# Patient Record
Sex: Female | Born: 1975 | Race: White | Hispanic: No | Marital: Married | Smoking: Never smoker
Health system: Southern US, Community
[De-identification: ages and names within clinical notes are randomized; demographics above are authoritative.]

## PROBLEM LIST (undated history)

## (undated) ENCOUNTER — Emergency Department (HOSPITAL_COMMUNITY): Disposition: A | Discharge status: Home / Self Care | Payer: BC Managed Care – PPO

## (undated) DIAGNOSIS — T4145XA Adverse effect of unspecified anesthetic, initial encounter: Secondary | ICD-10-CM

## (undated) DIAGNOSIS — F988 Other specified behavioral and emotional disorders with onset usually occurring in childhood and adolescence: Secondary | ICD-10-CM

## (undated) DIAGNOSIS — F32A Depression, unspecified: Secondary | ICD-10-CM

## (undated) DIAGNOSIS — T8859XA Other complications of anesthesia, initial encounter: Secondary | ICD-10-CM

## (undated) DIAGNOSIS — IMO0002 Reserved for concepts with insufficient information to code with codable children: Secondary | ICD-10-CM

## (undated) DIAGNOSIS — M519 Unspecified thoracic, thoracolumbar and lumbosacral intervertebral disc disorder: Secondary | ICD-10-CM

## (undated) DIAGNOSIS — T7840XA Allergy, unspecified, initial encounter: Secondary | ICD-10-CM

## (undated) DIAGNOSIS — M4310 Spondylolisthesis, site unspecified: Secondary | ICD-10-CM

## (undated) DIAGNOSIS — F419 Anxiety disorder, unspecified: Secondary | ICD-10-CM

## (undated) DIAGNOSIS — F329 Major depressive disorder, single episode, unspecified: Secondary | ICD-10-CM

## (undated) HISTORY — PX: TONSILLECTOMY: SUR1361

## (undated) HISTORY — DX: Spondylolisthesis, site unspecified: M43.10

## (undated) HISTORY — DX: Allergy, unspecified, initial encounter: T78.40XA

## (undated) HISTORY — PX: HYSTERECTOMY ABDOMINAL WITH SALPINGO-OOPHORECTOMY: SHX6792

## (undated) HISTORY — DX: Unspecified thoracic, thoracolumbar and lumbosacral intervertebral disc disorder: M51.9

## (undated) HISTORY — PX: CHOLECYSTECTOMY: SHX55

## (undated) HISTORY — DX: Reserved for concepts with insufficient information to code with codable children: IMO0002

## (undated) HISTORY — PX: ABDOMINAL HYSTERECTOMY: SHX81

---

## 2005-03-23 HISTORY — PX: OTHER SURGICAL HISTORY: SHX169

## 2009-10-24 ENCOUNTER — Emergency Department (HOSPITAL_COMMUNITY): Admission: EM | Admit: 2009-10-24 | Discharge: 2009-10-24 | Payer: Self-pay | Admitting: Emergency Medicine

## 2009-11-06 ENCOUNTER — Emergency Department (HOSPITAL_COMMUNITY): Admission: EM | Admit: 2009-11-06 | Discharge: 2009-11-06 | Payer: Self-pay | Admitting: Emergency Medicine

## 2009-11-28 ENCOUNTER — Emergency Department (HOSPITAL_COMMUNITY): Admission: EM | Admit: 2009-11-28 | Discharge: 2009-11-28 | Payer: Self-pay | Admitting: Emergency Medicine

## 2009-12-14 ENCOUNTER — Emergency Department (HOSPITAL_COMMUNITY)
Admission: EM | Admit: 2009-12-14 | Discharge: 2009-12-14 | Payer: Self-pay | Source: Home / Self Care | Admitting: Emergency Medicine

## 2009-12-15 ENCOUNTER — Emergency Department (HOSPITAL_COMMUNITY)
Admission: EM | Admit: 2009-12-15 | Discharge: 2009-12-15 | Payer: Self-pay | Source: Home / Self Care | Admitting: Emergency Medicine

## 2010-01-21 ENCOUNTER — Emergency Department (HOSPITAL_COMMUNITY): Admission: EM | Admit: 2010-01-21 | Discharge: 2010-01-21 | Payer: Self-pay | Admitting: Emergency Medicine

## 2010-02-06 ENCOUNTER — Observation Stay (HOSPITAL_COMMUNITY)
Admission: EM | Admit: 2010-02-06 | Discharge: 2010-02-07 | Payer: Self-pay | Source: Home / Self Care | Admitting: Emergency Medicine

## 2010-02-26 ENCOUNTER — Emergency Department (HOSPITAL_COMMUNITY)
Admission: EM | Admit: 2010-02-26 | Discharge: 2010-02-26 | Payer: Self-pay | Source: Home / Self Care | Admitting: Emergency Medicine

## 2010-03-12 ENCOUNTER — Emergency Department (HOSPITAL_COMMUNITY)
Admission: EM | Admit: 2010-03-12 | Discharge: 2010-03-12 | Payer: Self-pay | Source: Home / Self Care | Admitting: Emergency Medicine

## 2010-03-13 ENCOUNTER — Observation Stay (HOSPITAL_COMMUNITY)
Admission: EM | Admit: 2010-03-13 | Discharge: 2010-03-16 | Payer: Self-pay | Source: Home / Self Care | Attending: Internal Medicine | Admitting: Internal Medicine

## 2010-04-12 ENCOUNTER — Emergency Department (HOSPITAL_COMMUNITY)
Admission: EM | Admit: 2010-04-12 | Discharge: 2010-04-12 | Payer: Self-pay | Source: Home / Self Care | Admitting: Emergency Medicine
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
## 2010-04-23 ENCOUNTER — Emergency Department (HOSPITAL_COMMUNITY)
Admission: EM | Admit: 2010-04-23 | Discharge: 2010-04-23 | Disposition: A | Discharge status: Home / Self Care | Payer: BC Managed Care – PPO | Attending: Emergency Medicine | Admitting: Emergency Medicine

## 2010-04-23 DIAGNOSIS — G43909 Migraine, unspecified, not intractable, without status migrainosus: Secondary | ICD-10-CM | POA: Insufficient documentation

## 2010-04-24 ENCOUNTER — Emergency Department (HOSPITAL_COMMUNITY)
Admission: EM | Admit: 2010-04-24 | Discharge: 2010-04-24 | Disposition: A | Discharge status: Home / Self Care | Payer: BC Managed Care – PPO | Attending: Emergency Medicine | Admitting: Emergency Medicine

## 2010-04-24 DIAGNOSIS — R229 Localized swelling, mass and lump, unspecified: Secondary | ICD-10-CM | POA: Insufficient documentation

## 2010-04-24 DIAGNOSIS — E119 Type 2 diabetes mellitus without complications: Secondary | ICD-10-CM | POA: Insufficient documentation

## 2010-04-24 DIAGNOSIS — M79609 Pain in unspecified limb: Secondary | ICD-10-CM | POA: Insufficient documentation

## 2010-04-29 ENCOUNTER — Encounter (HOSPITAL_COMMUNITY): Payer: Self-pay | Admitting: Radiology

## 2010-04-29 ENCOUNTER — Emergency Department (HOSPITAL_COMMUNITY): Payer: BC Managed Care – PPO

## 2010-04-29 ENCOUNTER — Inpatient Hospital Stay (HOSPITAL_COMMUNITY)
Admission: EM | Admit: 2010-04-29 | Discharge: 2010-05-03 | DRG: 025 | Disposition: A | Discharge status: Home / Self Care | Payer: BC Managed Care – PPO | Attending: Family Medicine | Admitting: Family Medicine

## 2010-04-29 DIAGNOSIS — E119 Type 2 diabetes mellitus without complications: Secondary | ICD-10-CM | POA: Diagnosis present

## 2010-04-29 DIAGNOSIS — F449 Dissociative and conversion disorder, unspecified: Secondary | ICD-10-CM | POA: Diagnosis present

## 2010-04-29 DIAGNOSIS — K219 Gastro-esophageal reflux disease without esophagitis: Secondary | ICD-10-CM | POA: Diagnosis present

## 2010-04-29 DIAGNOSIS — G43409 Hemiplegic migraine, not intractable, without status migrainosus: Principal | ICD-10-CM | POA: Diagnosis present

## 2010-04-29 DIAGNOSIS — F319 Bipolar disorder, unspecified: Secondary | ICD-10-CM | POA: Diagnosis present

## 2010-04-29 DIAGNOSIS — R471 Dysarthria and anarthria: Secondary | ICD-10-CM | POA: Diagnosis present

## 2010-04-29 DIAGNOSIS — F339 Major depressive disorder, recurrent, unspecified: Secondary | ICD-10-CM | POA: Diagnosis present

## 2010-04-29 DIAGNOSIS — G4733 Obstructive sleep apnea (adult) (pediatric): Secondary | ICD-10-CM | POA: Diagnosis present

## 2010-04-29 DIAGNOSIS — F988 Other specified behavioral and emotional disorders with onset usually occurring in childhood and adolescence: Secondary | ICD-10-CM | POA: Diagnosis present

## 2010-04-29 DIAGNOSIS — Z79899 Other long term (current) drug therapy: Secondary | ICD-10-CM

## 2010-04-29 DIAGNOSIS — R197 Diarrhea, unspecified: Secondary | ICD-10-CM | POA: Diagnosis present

## 2010-04-29 LAB — COMPREHENSIVE METABOLIC PANEL
AST: 24 U/L (ref 0–37)
Albumin: 3.4 g/dL — ABNORMAL LOW (ref 3.5–5.2)
Alkaline Phosphatase: 51 U/L (ref 39–117)
BUN: 8 mg/dL (ref 6–23)
Chloride: 108 mEq/L (ref 96–112)
GFR calc Af Amer: >60 mL/min (ref >60)
Potassium: 3.8 mEq/L (ref 3.5–5.1)
Total Bilirubin: 0.6 mg/dL (ref 0.3–1.2)
Total Protein: 6.1 g/dL (ref 6.0–8.3)

## 2010-04-29 LAB — CBC
MCV: 79.8 fL (ref 78.0–100.0)
Platelets: 177 10*3/uL (ref 150–400)
RBC: 4.4 MIL/uL (ref 3.87–5.11)
RDW: 13.7 % (ref 11.5–15.5)
WBC: 7.5 10*3/uL (ref 4.0–10.5)

## 2010-04-29 LAB — DIFFERENTIAL
Basophils Absolute: 0 10*3/uL (ref 0.0–0.1)
Basophils Relative: 0 % (ref 0–1)
Eosinophils Absolute: 0.2 10*3/uL (ref 0.0–0.7)
Eosinophils Relative: 3 % (ref 0–5)
Neutrophils Relative %: 47 % (ref 43–77)

## 2010-04-30 LAB — GLUCOSE, CAPILLARY
Glucose-Capillary: 115 mg/dL — ABNORMAL HIGH (ref 70–99)
Glucose-Capillary: 102 mg/dL — ABNORMAL HIGH (ref 70–99)
Glucose-Capillary: 125 mg/dL — ABNORMAL HIGH (ref 70–99)

## 2010-04-30 LAB — MAGNESIUM: Magnesium: 1.9 mg/dL (ref 1.5–2.5)

## 2010-05-01 LAB — GLUCOSE, CAPILLARY
Glucose-Capillary: 148 mg/dL — ABNORMAL HIGH (ref 70–99)
Glucose-Capillary: 122 mg/dL — ABNORMAL HIGH (ref 70–99)
Glucose-Capillary: 136 mg/dL — ABNORMAL HIGH (ref 70–99)

## 2010-05-02 ENCOUNTER — Inpatient Hospital Stay (HOSPITAL_COMMUNITY): Payer: BC Managed Care – PPO

## 2010-05-02 LAB — BASIC METABOLIC PANEL
BUN: 11 mg/dL (ref 6–23)
Calcium: 8.6 mg/dL (ref 8.4–10.5)
Creatinine, Ser: 0.78 mg/dL (ref 0.4–1.2)
GFR calc Af Amer: >60 mL/min (ref >60)
GFR calc non Af Amer: >60 mL/min (ref >60)

## 2010-05-02 LAB — GLUCOSE, CAPILLARY
Glucose-Capillary: 144 mg/dL — ABNORMAL HIGH (ref 70–99)
Glucose-Capillary: 161 mg/dL — ABNORMAL HIGH (ref 70–99)
Glucose-Capillary: 193 mg/dL — ABNORMAL HIGH (ref 70–99)

## 2010-05-02 LAB — CBC
Platelets: 185 10*3/uL (ref 150–400)
RBC: 4.61 MIL/uL (ref 3.87–5.11)
RDW: 13.7 % (ref 11.5–15.5)
WBC: 9.4 10*3/uL (ref 4.0–10.5)

## 2010-05-03 DIAGNOSIS — F319 Bipolar disorder, unspecified: Secondary | ICD-10-CM

## 2010-05-03 LAB — GLUCOSE, CAPILLARY
Glucose-Capillary: 167 mg/dL — ABNORMAL HIGH (ref 70–99)
Glucose-Capillary: 164 mg/dL — ABNORMAL HIGH (ref 70–99)

## 2010-05-03 LAB — CBC
HCT: 38.9 % (ref 36.0–46.0)
Hemoglobin: 12.4 g/dL (ref 12.0–15.0)
MCH: 25.9 pg — ABNORMAL LOW (ref 26.0–34.0)
MCHC: 31.9 g/dL (ref 30.0–36.0)
MCV: 81.4 fL (ref 78.0–100.0)
Platelets: 193 10*3/uL (ref 150–400)
RBC: 4.78 MIL/uL (ref 3.87–5.11)
RDW: 13.5 % (ref 11.5–15.5)
WBC: 8.3 10*3/uL (ref 4.0–10.5)

## 2010-05-03 LAB — DIFFERENTIAL
Basophils Absolute: 0.1 10*3/uL (ref 0.0–0.1)
Basophils Relative: 1 % (ref 0–1)
Eosinophils Relative: 4 % (ref 0–5)
Monocytes Absolute: 0.4 10*3/uL (ref 0.1–1.0)

## 2010-05-04 NOTE — Consult Note (Signed)
[name], [name]               ACCOUNT NO.:  [id number]  MEDICAL RECORD NO.:  [id number]           PATIENT TYPE:  I  LOCATION:  1521                         FACILITY:  [facility]  PHYSICIAN:  [name], MD DATE OF BIRTH:  25-Nov-1975  DATE OF CONSULTATION:  05/01/2010 DATE OF DISCHARGE:                                CONSULTATION   REASON FOR CONSULTATION:  Rule out conversion disorder, psychogenic weakness.  HISTORY OF PRESENT ILLNESS:  A 35 year old Caucasian female who was admitted because of sudden onset of left-sided weakness and worsening headache.  The patient has a history of hemiplegic migraine.  The patient is followed by Dr. Lesia Sago, neurologist, and she is also followed by Dr. [name],  the psychiatrist, for depression and anxiety. The patient has multiple admissions to the ER for the migraine headaches.  The patient also has a history of diabetes mellitus, obstructive sleep apnea, and gastroesophageal reflux disease.  The patient has been seen by the neurology, and all the neurological exam and workup is negative.  The patient has a long history of depression and anxiety.  She takes Viibryd 40 mg p.o. daily, Adderall 20 mg p.o. t.i.d., and trazodone 150 mg at bedtime.  The patient told me she takes Viibryd regularly and it is working, but she takes Xanax only as needed, not regularly.  She is on Xanax 1 mg t.i.d.  The patient is logical and goal directed during the interview, not internally preoccupied.  Denies hearing any voices.  Denies any suicidal ideations. The patient has slurred speech and reported left-sided weakness. Because of slurred speech, the patient was not able to provide detailed history about her issues.  She told me a few things by writing on the paper.  PAST PSYCHIATRIC HISTORY:  The patient denies any admission to psych hospital in the past.  Denied any history of suicide attempt.  She is followed by Dr. [name], the psychiatrist, in the  outpatient setting for depression.  PAST MEDICAL HISTORY: 1. Diabetes mellitus. 2. Obstructive sleep apnea. 3. Migraine headaches.  SOCIAL HISTORY:  The patient is married, has 3 children.  She does not work.  Substance abuse history, the patient denies abuse of any drugs or alcohol.  ALLERGIES:  She is allergic to a number of medications including MORPHINE, PENICILLIN, SULFA.  CURRENT PSYCH MEDICATIONS: 1. Adderall 20 mg t.i.d. 2. Trazodone 150 mg at bedtime. 3. Xanax 1 mg t.i.d. as needed. 4. Viibryd 40 mg p.o. daily.  MENTAL STATUS EXAM:  Calm and cooperative during the interview.  Fair eye contact.  Speech slurred.  Mood depressed.  Affect mood congruent. Thought process logical and goal directed.  Thought content not suicidal or homicidal, not delusional thought perception. No audiovisual hallucination reported, not internally preoccupied.  Cognition; alert, awake, and oriented x3.  Memory immediate, recent, and remote fair. Attention and concentration fair.  Abstraction ability good.  Fund of knowledge fair.  Insight and judgment fair.DIAGNOSIS:  AXIS I:  Conversion disorder; major depressive disorder, recurrent type; and anxiety disorder, not otherwise specified. AXIS II:  Deferred. AXIS III:  See medical note. AXIS IV:  Chronic medical  and mental issues. AXIS V:  50.  RECOMMENDATIONS:  I think this patient has conversion disorder because of:   It is not intentionally produced, it is not explained by any organic etiology.  It has caused significant functional impairment.  1. The treatment for conversion disorder is supportive and insight oriented  therapy.  The symptoms will last from few days to few weeks and remit spontaneously.  2. The patient will get benefit from benzodiazepines.  She is already     on Xanax 1 mg 3 times a day. 3. Avoid confrontation with the patient. 4. The patient will get benefit by counseling regularly in the     outpatient setting. 5.  I started the patient on Abilify 2 mg twice a day to augment action     of Viibryd for both  depression and anxiety. 6. I will follow up with the patient as needed.     [name], MD     SA/MEDQ  D:  05/01/2010  T:  05/01/2010  Job:  [id number]  Electronically Signed by [name]  on 05/01/2010 04:25:14 PM

## 2010-05-06 NOTE — Consult Note (Signed)
NAMEANALEYA, LUALLEN NO.:  [id number]  MEDICAL RECORD NO.:  [id number]           PATIENT TYPE:  E  LOCATION:  WLED                         FACILITY:  [facility]  PHYSICIAN:  Marlan Palau, M.D.  DATE OF BIRTH:  30-Aug-1975  DATE OF CONSULTATION:  04/29/2010 DATE OF DISCHARGE:                                CONSULTATION   HISTORY OF PRESENT ILLNESS:  [name] is a 35 year old right-handed white female born on 18-Jan-1976, with a history of "hemiplegic migraine."  This patient was initially seen through our office on February 10, 2010.  This patient was found to have functional features on the clinical examination, was manipulating the motor exam.  The patient was felt to have psychogenic weakness.  The patient has since presented to the hospital on March 14, 2010, with similar type symptoms and once again was felt to have psychogenic weakness of the left side.  This patient has had several MRI studies of the brain that have been unremarkable.  The patient has undergone a CT scan of the head today that is unremarkable.  The [facility] the [facility] today when she began having her usual headache, slurred speech, left-sided weakness and left-sided numbness that affects the face, arm and the leg. Once again, the patient comes to the emergency room for evaluation. Neurology is asked to see the patient for further evaluation.  PAST MEDICAL HISTORY:  Significant for: 1. Obesity. 2. Depression. 3. History of diabetes. 4. History of migraine. 5. Obstructive sleep apnea. 6. Gallbladder resection. 7. Tonsillectomy. 8. C-section on 3 occasions. 9. 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  SOCIAL HISTORY:  This patient is married.  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  The patient has a high school and some vocational school education and has 3 children.  The patient does not smoke  or drink.  ALLERGIES:  She has multiple medication allergies that include CEPHALOSPORIN, PENICILLIN, SULFA DRUGS, MORPHINE, CODEINE, DEMEROL, DECADRON, COMPAZINE, ZOFRAN, ATARAX REGLAN.  The patient claims that all of these medications result in anaphylaxis.  FAMILY MEDICAL HISTORY:  Notable that both parents are still living and are in good health.  Mother has a history of migraine.  The patient has one brother and no sisters.  The brother has history obesity and sleep apnea.  There is a history of cancer in maternal grandfather and maternal grandmother, paternal grandfather and a maternal aunt.  There is a history of diabetes in maternal grandfather, maternal aunt, and in the mother.  Maternal aunt and paternal grandmother had depression. There is a history of stroke in maternal grandfather.  Alzheimer's disease also runs in the family.  REVIEW OF SYSTEMS:  Notable for that the patient does report some chills.  No fevers.  The patient reports left-sided headache and stiffness in the left neck, chest pains.  The patient notes no abdominal pain.  No nausea, vomiting.  Has left-sided numbness or weakness.  PHYSICAL EXAMINATION:  VITAL SIGNS:  Blood pressure 112/54, heart rate 87, respiratory rate 18, temperature afebrile. GENERAL:  This  patient is a markedly obese white female who is alert, cooperative at the time of examination. HEENT EXAMINATION:  Head is atraumatic.  Eyes:  Pupils are equal, round and reactive to light. NECK:  Supple.  No carotid bruits noted. RESPIRATORY:  Examination is clear. CARDIOVASCULAR:  Reveals regular rate and rhythm.  No obvious murmurs, rubs are noted. EXTREMITIES:  Without significant edema. NEUROLOGIC EXAMINATION:  Cranial nerves as above.  The patient has asymmetric smile, pulling the face on the right than on the left.  The patient has decreased pinprick sensation on the left face, the right splits the midline with vibratory sensation on the  forehead.  Speech is dysarthric, no aphasia noted.  Motor testing reveals variable effort with the left arm and left leg.  The patient will not let the left arm flop down on her face but avoids the arm and at other times can hold up her upper arm but at other times cannot.  The patient has variable testing with the left leg as well.  When attempting to push up the right leg, the patient will push down with the left and not vice versa.  While lifting the right leg, if the left leg is also lifted, she actively pushes down with good strength into the bed with the left leg.  The patient also has good strength with abduction and adduction of the knees when both legs are activated together but not independently.  The patient notes decreased pinprick sensation on the left arm, left leg as compared to right.  Decreased vibratory sensation in left arm and left legs as compared to right.  The patient performs good finger-nose- finger, heel-to-shin with right; would not perform on the left.  The patient was not ambulated.  Visual fields are full.  Deep tendon reflexes are symmetric and normal.  Toes downgoing bilaterally.  LABORATORY DATA:  No laboratory values are available.  IMPRESSION: 1. History of headache. 2. History of left-sided weakness and sensory alteration that appears     to be functional/psychogenic. 3. History of depression, followed by Dr. [name]. 4. Diabetes.  This patient once again has examination full of nonorganic functional features and reveals manipulation of the motor examination, nonorganic sensory exam.  This patient has left-sided weakness and slurred speech as manifestation of psychiatric condition.  At this point, we would not recommend treatment for migraine or any other further neurologic workup. The patient may need further attention from her psychiatrist, Dr. [name].  CURRENT MEDICATIONS:  Include: 1. Propranolol 30 mg twice daily. 2. Migranal nasal spray 2  sprays twice daily if needed. 3. Protonix 40 mg daily. 4. Promethazine 25 mg 1 every 6 hours if needed. 5. Aldactone 50 mg daily. 6. Viibryd 40 mg daily. 7. Xanax 1 mg 3 times daily. 8. Trazodone 150 mg at night. 9. Adderall 20 mg 3 times day. 10.Metformin ER tablet 1000 mg once a day. 11.Amaryl 2 mg once a day. 12.Victoza 1.8 subcu daily at noon.     Marlan Palau, M.D.     CKW/MEDQ  D:  04/29/2010  T:  04/30/2010  Job:  [id number]  cc:   [name], MD Fax: [phone]  Milagros Evener, M.D. Fax: [phone]  Electronically Signed by Thana Farr M.D. on 05/06/2010 09:58:52 AM

## 2010-05-08 ENCOUNTER — Emergency Department (HOSPITAL_COMMUNITY): Payer: BC Managed Care – PPO

## 2010-05-08 ENCOUNTER — Observation Stay (HOSPITAL_COMMUNITY)
Admission: EM | Admit: 2010-05-08 | Discharge: 2010-05-12 | Disposition: A | Discharge status: Home / Self Care | Payer: BC Managed Care – PPO | Attending: Internal Medicine | Admitting: Internal Medicine

## 2010-05-08 DIAGNOSIS — R4789 Other speech disturbances: Secondary | ICD-10-CM | POA: Insufficient documentation

## 2010-05-08 DIAGNOSIS — R059 Cough, unspecified: Secondary | ICD-10-CM | POA: Insufficient documentation

## 2010-05-08 DIAGNOSIS — E119 Type 2 diabetes mellitus without complications: Secondary | ICD-10-CM | POA: Insufficient documentation

## 2010-05-08 DIAGNOSIS — F909 Attention-deficit hyperactivity disorder, unspecified type: Secondary | ICD-10-CM | POA: Insufficient documentation

## 2010-05-08 DIAGNOSIS — I10 Essential (primary) hypertension: Secondary | ICD-10-CM | POA: Insufficient documentation

## 2010-05-08 DIAGNOSIS — G8190 Hemiplegia, unspecified affecting unspecified side: Secondary | ICD-10-CM | POA: Insufficient documentation

## 2010-05-08 DIAGNOSIS — R29898 Other symptoms and signs involving the musculoskeletal system: Secondary | ICD-10-CM | POA: Insufficient documentation

## 2010-05-08 DIAGNOSIS — F319 Bipolar disorder, unspecified: Secondary | ICD-10-CM | POA: Insufficient documentation

## 2010-05-08 DIAGNOSIS — R1110 Vomiting, unspecified: Secondary | ICD-10-CM | POA: Insufficient documentation

## 2010-05-08 DIAGNOSIS — Z79899 Other long term (current) drug therapy: Secondary | ICD-10-CM | POA: Insufficient documentation

## 2010-05-08 DIAGNOSIS — K219 Gastro-esophageal reflux disease without esophagitis: Secondary | ICD-10-CM | POA: Insufficient documentation

## 2010-05-08 DIAGNOSIS — F449 Dissociative and conversion disorder, unspecified: Secondary | ICD-10-CM | POA: Insufficient documentation

## 2010-05-08 DIAGNOSIS — R05 Cough: Secondary | ICD-10-CM | POA: Insufficient documentation

## 2010-05-08 DIAGNOSIS — R279 Unspecified lack of coordination: Secondary | ICD-10-CM | POA: Insufficient documentation

## 2010-05-08 DIAGNOSIS — G4733 Obstructive sleep apnea (adult) (pediatric): Secondary | ICD-10-CM | POA: Insufficient documentation

## 2010-05-08 DIAGNOSIS — E669 Obesity, unspecified: Secondary | ICD-10-CM | POA: Insufficient documentation

## 2010-05-08 DIAGNOSIS — G43909 Migraine, unspecified, not intractable, without status migrainosus: Principal | ICD-10-CM | POA: Insufficient documentation

## 2010-05-08 LAB — CBC
MCV: 78.8 fL (ref 78.0–100.0)
Platelets: 228 10*3/uL (ref 150–400)
RDW: 13.5 % (ref 11.5–15.5)
WBC: 10.2 10*3/uL (ref 4.0–10.5)

## 2010-05-08 LAB — URINALYSIS, ROUTINE W REFLEX MICROSCOPIC
Hgb urine dipstick: NEGATIVE
Ketones, ur: NEGATIVE mg/dL
Protein, ur: NEGATIVE mg/dL
Urine Glucose, Fasting: NEGATIVE mg/dL
pH: 6 (ref 5.0–8.0)

## 2010-05-08 LAB — GLUCOSE, CAPILLARY: Glucose-Capillary: 127 mg/dL — ABNORMAL HIGH (ref 70–99)

## 2010-05-08 LAB — DIFFERENTIAL
Basophils Absolute: 0 10*3/uL (ref 0.0–0.1)
Basophils Relative: 0 % (ref 0–1)
Eosinophils Absolute: 0.1 10*3/uL (ref 0.0–0.7)
Eosinophils Relative: 1 % (ref 0–5)
Lymphs Abs: 2 10*3/uL (ref 0.7–4.0)

## 2010-05-08 LAB — POCT I-STAT, CHEM 8
Calcium, Ion: 1.17 mmol/L (ref 1.12–1.32)
Glucose, Bld: 154 mg/dL — ABNORMAL HIGH (ref 70–99)
HCT: 42 % (ref 36.0–46.0)
Hemoglobin: 14.3 g/dL (ref 12.0–15.0)
TCO2: 23 mmol/L (ref 0–100)

## 2010-05-09 DIAGNOSIS — F39 Unspecified mood [affective] disorder: Secondary | ICD-10-CM

## 2010-05-09 LAB — GLUCOSE, CAPILLARY
Glucose-Capillary: 158 mg/dL — ABNORMAL HIGH (ref 70–99)
Glucose-Capillary: 120 mg/dL — ABNORMAL HIGH (ref 70–99)

## 2010-05-09 NOTE — Discharge Summary (Signed)
[name], [name]               ACCOUNT NO.:  [id number]  MEDICAL RECORD NO.:  [id number]           PATIENT TYPE:  I  LOCATION:  1521                         FACILITY:  [facility]  PHYSICIAN:  Pleas Koch, MD        DATE OF BIRTH:  [birth year]  DATE OF ADMISSION:  04/29/2010 DATE OF DISCHARGE:  05/03/2010                              DISCHARGE SUMMARY   PRIMARY CARE PHYSICIAN:  [name], MD  DISCHARGE DIAGNOSES: 1. Left-sided weakness and headache (? Hemiplegic migraine versus     conversion disorder). 2. Hypertension. 3. Attention-deficit disorder. 4. Diabetes mellitus. 5. Obstructive sleep apnea. 6. Anxiety. 7. Bipolar. 8. Gastroesophageal reflux disease.  DISCHARGE MEDICATIONS: 1. Adderall 20 mg 1 tablet p.o. t.i.d. 2. Xanax 1 mg p.o. t.i.d. 3. Propranolol 10 mg 2 tablets daily. 4. Nasacort 1 spray daily p.r.n. 5. Promethazine 25 mg q.6 p.r.n. 6. Protonix 40 mg 1 tablet daily. 7. Amaryl 2 mg 1 tablet daily. 8. Fioricet 1 tablet q.6 p.r.n. 9. Glucose tablet over-the-counter 1 tablet q.1 p.r.n. 10.Glumetza ER 1000 mg 1 tablet daily. 11.Migranal 2 mg 1 spray daily. 12.Tramadol 37.5/25 1 tablet q.6 p.r.n. 13.Victoza 1.2 mg subcu daily. 14.Viibryd 40 mg 1 tablet daily. 15.Vitamin D3 1000 units daily.  NEW MEDICATIONS: 1. Oxycodone/acetaminophen 5/325 1 tablet q.6 p.r.n. for pain.     Limited prescription given for 7 days. 2. Cepacol lozenges, 1 lozenge p.r.n. throat congestion. 3. Aripiprazole 2 mg p.o. b.i.d. and 60, 1 month's supply given.  PERTINENT CONSULTS:  Neurology, Dr. Lesia Sago. Psychiatry, Dr. Corinda Gubler.  RADIOLOGICAL FINDINGS:  CT of the head dated 04/29/2010 showed no evidence of acute infarction, hemorrhage, or mass lesion.  No extraaxial fluid collection or midline shift, calvarium intact.  No fluid in the sinuses visualized.  Portable chest x-ray showed reduced lung volumes accentuating size; this was dated 05/02/2010.   Considerations include pneumonia, drug reaction, inhalation injury, vascular congestion idiopathic, marked worsening aeration compared to prior.  Bones unremarkable.  BRIEF HISTORY AND PHYSICAL:  Please see full dictation dated April 29, 2010.  Briefly, this is a 34-year Caucasian female with above medical illnesses who presented with sudden onset of left-sided weakness, worsening headache.  She is known to have history of hemiplegic migraine and headaches and was admitted recently to the hospital for treatment and discharged home with propranolol, followed by Dr. Lesia Sago of Neurology, and was seen on the day of admission at [facility].  She left the physician's office and in the parking lot abruptly developed left-sided weakness, starting dragging her leg and unable to speak.  She has had these headaches in June 2011 with multiple presentations to the ED and most recently around Christmas time last year.  She has no history of strokes.  She has no other history of significant illnesses.  She has a long and extensive psychiatric history and has been on medication for a while.  On admission, she was alert, oriented.  She had mild facial asymmetry, narrowed left eye.  She had asymmetric smile.  Tip of tongue deviated to the left.  Strength and involuntary movements on  the right side were completely intact.  Left side was weak.  The patient was unable to move that side and follow commands.  Lab data showed no ST-T wave changes. CT scan, as above, had no acute findings.  Lab data was also noncontributory.  IMPRESSION AND PLAN: 1. Left-sided weakness and pain:  This is thought to be maybe migraine     variant with hemiplegia and dysarthria.  The patient was seen by     Dr. Anne Hahn who suggested symptomatic treatment, who suggested this     is psychosomatic in origin.  She complained continuously of     headaches and was given Dilaudid and Phenergan.  She has been  seen     by Dr. [name], psychiatrist, in the past and I did contact her during     this hospitalization.  I also did try to contact [facility] to get old notes of Dr. Jae Dire who was her treating physician there.  Unfortunately that physician left the practice and even though I was able to get in touch, she could not recall the patient's symptomatology at that point in time.  I had a long discussion with husband who says this has been going on since 2006.  She abruptly got better on May 03, 2010 and had full motor strength back in her left upper extremity and left lower extremity.  Although we did get physical therapy involved to help her out and they recommended home health, PT, OT.  I think the patient likely will do well at home on her own as she was able to ambulate up and down the halls and was going to bathroom by herself.  Psychiatry, Dr. Rogers Blocker did see her in the hospital and thought that this could be a possible conversion disorder as well.  As such, he started on Abilify 2 mg p.o. b.i.d. which I have discharged her home on. Dr. [name] is to follow her up in the outpatient setting and determine further course of treatment.  1. Diabetes mellitus:  In the hospital, the patient had A1c of 7.6 and     will continue on her home medications as listed above.  She seems     motivated to lose weight and does have friends who join her at the     gym for "the biggest loser" program. We will allow her to go back     to the gym after 3 to 4 days and I will give her a note regarding     this as well.  1. Hypertension:  Hypertension was well-controlled in hospital and she     will continue on her medications as prior.  She is on propranolol     low-dose, I believe more for migraine prophylaxis.  1. Bipolar disease:  She will continue her Xanax.  She will also     continue her aripiprazole.  1. Gastroesophageal reflux disease:  She will continue on her Protonix     at  home.  1. Obstructive sleep apnea:  She will get her CPAP at home as usual.     She would need to follow up with pulmonary physician in the future     to titrate this.  On the day of discharge, the patient was doing well, moving around actively in no significant distress.  She asked for a note which has been provided her to her gym.  Temperature 97.9, pulse 83, respirations 18, blood pressure 98 to 115 over 64 to 74.  White count was 8.3, hemoglobin was 12.4, hematocrit was 38.9, platelet count was 143.          ______________________________ Pleas Koch, MD     JS/MEDQ  D:  05/03/2010  T:  05/03/2010  Job:  [id number]  cc:   [name], MD Fax: [phone]  Milagros Evener, M.D. Fax: [phone]  C. Lesia Sago, M.D. Fax: [phone]  [facility] [address] [place], [place] [address]  Electronically Signed by Pleas Koch MD on 05/09/2010 03:07:18 PM

## 2010-05-09 NOTE — H&P (Signed)
[name], [name]               ACCOUNT NO.:  [id number]  MEDICAL RECORD NO.:  [id number]           PATIENT TYPE:  E  LOCATION:  WLED                         FACILITY:  [facility]  PHYSICIAN:  Arne Cleveland, MD       DATE OF BIRTH:  09/21/1975  DATE OF ADMISSION:  04/29/2010 DATE OF DISCHARGE:                             HISTORY & PHYSICAL   CHIEF COMPLAINT:  Left-sided weakness and headache.  HISTORY OF PRESENT ILLNESS:  This is a 35 year old Caucasian female who presented to the Emergency Department at [facility] with complaints of sudden onset of left-sided weakness and worsening headache.  The patient has a known history of hemiplegic migraine headaches and recently was admitted to the hospital for treatment and was discharged home on propranolol.  She is being followed by Dr. Lesia Sago, neurologist and today was seen at [facility] where she was referred by Dr. Anne Hahn for treatment of the headache.  The patient said that when she left physician's office in the parking lot, she abruptly developed left-sided weakness and started dragging her leg and was unable to speak.  PAST MEDICAL HISTORY:  Significant for hemiplegic migraine headaches; and since June 2011, the patient had multiple presentations to the emergency department with most recent admission around the Christmas time last year.  She also has a history of diabetes mellitus, obstructive sleep apnea, attention deficit disorder, anxiety, depression, and gastroesophageal reflux disease.  FAMILY HISTORY:  Significant for diabetes mellitus and for migraine headaches.  Does have mother who is affected with both conditions.  SOCIAL HISTORY:  She is married, has 3 children, and she does not work, moved to [place] recently in July 2011.  She denied tobacco use, alcohol, or illicit drugs.  ALLERGIES:  She has multiple drug allergies.  ATARAX, CEPHALOSPORINS, CODEINE, COMPAZINE, DECADRON, MORPHINE,  MEPERIDINE, PENICILLIN, REGLAN, and SULFA.  CODE:  The patient is a full code.  HOME MEDICATIONS: 1. Adderall 20 mg t.i.d. 2. Amaryl 10 mg daily. 3. Fioricet 1 pill p.r.n. q.6 h. 4. Metformin 1000 mg daily. 5. Migranal 4 mg/1 mL take 2 sprays b.i.d. p.r.n. 6. Propranolol 20 mg 1 pill daily. 7. Protonix 40 mg daily. 8. Aldactone 50 mg daily. 9. Trazodone 150 mg at bedtime. 10.Victoza 18 mg in 3 mL solution, 1.2 mg at bedtime. 11.Viibryd 40 mg one daily. 12.Vitamin D3 1000 units 1 pill once a day. 13.Xanax 1 mg p.o. t.i.d.  REVIEW OF SYSTEMS:  The patient complains of the headache and left-sided weakness.  Denied nausea.  Also complains of photophobia and of frequent loose stools.  She said she has been having loose stools probably for the last 7 days and only today she had up to 4-5 loose stools.  Also, complains of mild abdominal cramping.  All other review of systems is negative.  PHYSICAL EXAMINATION:  VITAL SIGNS:  Blood pressure 112/54, heart rate 87, respirations 16, temperature 98.1, and O2 sats 94%. HEENT:  Normocephalic and atraumatic.  There is a mild left-sided facial droop noted. NECK:  Without any JVD or carotid bruits. LUNGS: Clear to auscultation bilaterally. HEART:  Normal  S1 and S2 without any murmurs, rubs, or gallops. ABDOMEN:  Obese, nontender, and nondistended with positive bowel sounds throughout.  No significant organomegaly palpated. EXTREMITIES:  Without significant edema and intact peripheral pulses in all 4 extremities. MUSCULOSKELETAL:  No joint swelling or deformities noted.  The patient has a full range of motion and strength on the right side of the body and limb weak extremities on the left side. NEUROLOGIC:  The patient is alert and oriented x3.  She has mild facial asymmetry and narrowed left eye.  She also has asymmetric smile, and the tip of the tongue deviates to the left.  Strength and involuntary movements on the right side completely  intact, the left side is weak, and the patient unable to move and follow commands.  EKG showed normal sinus rhythm.  No ST-T wave changes.  LABORATORY DATA:  Magnesium 1.8, sodium 140, potassium 3.8, chloride 25, chloride 108, CO2 25, glucose 132, BUN 8, and creatinine 0.86.  CBC showed white blood cell count of 7.5, hemoglobin 11.7, hematocrit 35.1, and platelet count 176.  Head CT was performed today and reveals normal ventricular size and CSF stasis, no acute significant findings.  IMPRESSION: 1. Migraine headaches with hemiplegia and dysarthria - the patient was     seen by Dr. Anne Hahn in the hospital who was suggested psychosomatic     origin of her symptoms.  She was seen by Dr. Anne Hahn who recommended     not to treat her for migraine or hemiplegia; however, the patient     continues to complain about headache, was given Dilaudid in the     emergency room with relief of symptoms on presentation.  We will     give her another dose of Dilaudid to help through night.  This is     the complex issue that probably would require the concerted efforts     of neurologist, pain management specialist, and psychiatrist to     develop a therapeutic approach.  In the past, she was seen by Dr.     [name], psychiatrist.  Today, she was seen by Dr. Vear Clock and     [facility]; and as mentioned above, Dr. Lesia Sago     is her neurologist.  Also, the diagnosis of pseudotumor cerebri     could be included in the list of differential diagnosis. 2. Diabetes mellitus.  We will continue her home medications and put     her on insulin sliding scale. 3. Anxiety.  We will continue Xanax and depression.  Continue on     Viibryd. 4. Gastroesophageal reflux disease.  We will continue on Protonix. 5. Obstructive sleep apnea.  We will order CPAP overnight. 6. Diarrhea of unknown origin.  I will check her stools for above C     diff to rule out infectious colitis. 7. Deep vein thrombosis  prophylaxis with be achieved with Lovenox     injections.     Raymon Mutton, P.A.   ______________________________ Arne Cleveland, MD    MK/MEDQ  D:  04/29/2010  T:  04/30/2010  Job:  [id number]  cc:   Milagros Evener, M.D. Fax: [phone]  C. Lesia Sago, M.D. Fax: [phone]  Electronically Signed by Raymon Mutton P.A. on 05/02/2010 07:27:20 PM Electronically Signed by Arne Cleveland  on 05/09/2010 02:50:29 PM

## 2010-05-10 LAB — COMPREHENSIVE METABOLIC PANEL
ALT: 21 U/L (ref 0–35)
AST: 18 U/L (ref 0–37)
Albumin: 3.7 g/dL (ref 3.5–5.2)
Alkaline Phosphatase: 51 U/L (ref 39–117)
CO2: 29 mEq/L (ref 19–32)
Chloride: 103 mEq/L (ref 96–112)
GFR calc Af Amer: >60 mL/min (ref >60)
GFR calc non Af Amer: >60 mL/min (ref >60)
Potassium: 4 mEq/L (ref 3.5–5.1)
Total Bilirubin: 0.4 mg/dL (ref 0.3–1.2)

## 2010-05-10 LAB — GLUCOSE, CAPILLARY: Glucose-Capillary: 119 mg/dL — ABNORMAL HIGH (ref 70–99)

## 2010-05-11 LAB — GLUCOSE, CAPILLARY
Glucose-Capillary: 134 mg/dL — ABNORMAL HIGH (ref 70–99)
Glucose-Capillary: 182 mg/dL — ABNORMAL HIGH (ref 70–99)
Glucose-Capillary: 132 mg/dL — ABNORMAL HIGH (ref 70–99)
Glucose-Capillary: 168 mg/dL — ABNORMAL HIGH (ref 70–99)

## 2010-05-11 NOTE — Consult Note (Signed)
[name], GAL               ACCOUNT NO.:  [id number]  MEDICAL RECORD NO.:  [id number]           PATIENT TYPE:  I  LOCATION:  1311                         FACILITY:  [facility]  PHYSICIAN:  [name], MD DATE OF BIRTH:  1975-07-16  DATE OF CONSULTATION:  05/09/2010 DATE OF DISCHARGE:                                CONSULTATION   REASON FOR CONSULTATION:  Conversion disorder.  HISTORY OF PRESENT ILLNESS:  A 35 year old female who was admitted because she has an episode of left-sided weakness, slurred speech, and drooling from the left angle of the mouth.  The patient was brought by her husband to the hospital.  I have seen this patient in the past and I started her on Abilify.  She told me she went to Dr. [name], the outpatient psychiatrist and she continued her on these medications.  As advice in the past, the patient told me that she has an appointment with the counselor next week.  I told the patient, I would increase Abilify to 5 mg twice a day along with her Viibryd medication for depression.  I also told the patient that she should think in her mind that everything is okay that we make things better for her.  I also told her to relax herself and not to worry about the weakness and then it will go spontaneously.  I also educated her that this is stress-related, but she told me she do not have any stress at this time.  I also explained to the patient about conversion disorder that it is not that she is producing gait and it is because of her psychogenic reasons that she is feeling weakness.  I also explained her the treatment for conversion disorder is a regular supportive and behavioral therapy in the outpatient setting and benzodiazepine, Viibryd, and Abilify are going to help her in this.  SOCIAL HISTORY:  The patient lives with the husband and 3 kids.  SUBSTANCE ABUSE HISTORY:  No history of abuse of drugs or alcohol.  CURRENT PSYCHIATRIC MEDICATION: 1. Viibryd  40 mg p.o. daily. 2. Xanax 1 mg p.o. t.i.d. 3. Abilify increased to 5 mg b.i.d. 4. Adderall 20 mg p.o. t.i.d., the patient does not want to take     Adderall during the hospital.  I discontinued this medication.  MENTAL STATUS EXAM:  Calm, cooperative during the interview.  Fair eye contact.  Speech soft, slow, slurred, psychomotor retardation present. Mood depressed.  Affect and mood congruent.  Thought process, logical and goal directed.  Thought content, not suicidal or homicidal, not delusional.  Thought perception, no audiovisual hallucination reported or not internally preoccupied.  Cognition, alert, awake, oriented x3. Memory, immediate, recent, remote fair.  Attention concentration, fair. Abstraction ability, good.  Insight and judgment, fair.  DIAGNOSES:  Axis I:  Major depressive disorder recurrent type, somatoform disorder, rule out conversion disorder, anxiety disorder, not otherwise specified. Axis II:  Deferred. Axis III:  See medical notes. Axis IV:  Chronic medical and mental issues. Axis V:  50.  RECOMMENDATIONS: 1. As discussed in HPI.  I educated the patient about conversion  disorder. 2. The patient should be followed medically and neurologically. 3. I increased her Abilify to 5 mg twice a day. 4. I told the patient that she needs to relax herself. 5. I educated the patient to continue with counseling in the     outpatient setting and follow with Dr. [name] in the outpatient     setting. 6. I will follow up on this patient as needed.     [name], MD     SA/MEDQ  D:  05/09/2010  T:  05/09/2010  Job:  [id number]  Electronically Signed by [name]  on 05/11/2010 06:24:16 AM

## 2010-05-12 LAB — GLUCOSE, CAPILLARY
Glucose-Capillary: 116 mg/dL — ABNORMAL HIGH (ref 70–99)
Glucose-Capillary: 146 mg/dL — ABNORMAL HIGH (ref 70–99)

## 2010-05-29 ENCOUNTER — Emergency Department (HOSPITAL_COMMUNITY)
Admission: EM | Admit: 2010-05-29 | Discharge: 2010-05-29 | Disposition: A | Discharge status: Home / Self Care | Payer: BC Managed Care – PPO | Attending: Emergency Medicine | Admitting: Emergency Medicine

## 2010-05-29 DIAGNOSIS — J029 Acute pharyngitis, unspecified: Secondary | ICD-10-CM | POA: Insufficient documentation

## 2010-05-29 DIAGNOSIS — G43909 Migraine, unspecified, not intractable, without status migrainosus: Secondary | ICD-10-CM | POA: Insufficient documentation

## 2010-05-29 DIAGNOSIS — E119 Type 2 diabetes mellitus without complications: Secondary | ICD-10-CM | POA: Insufficient documentation

## 2010-06-02 LAB — URINALYSIS, ROUTINE W REFLEX MICROSCOPIC
Bilirubin Urine: NEGATIVE
Glucose, UA: NEGATIVE mg/dL
Hgb urine dipstick: NEGATIVE
Hgb urine dipstick: NEGATIVE
Protein, ur: NEGATIVE mg/dL
Specific Gravity, Urine: 1.018 (ref 1.005–1.030)
Specific Gravity, Urine: 1.016 (ref 1.005–1.030)
Urobilinogen, UA: 0.2 mg/dL (ref 0.0–1.0)
pH: 5.5 (ref 5.0–8.0)

## 2010-06-02 LAB — DIFFERENTIAL
Basophils Relative: 0 % (ref 0–1)
Eosinophils Absolute: 0.1 10*3/uL (ref 0.0–0.7)
Eosinophils Relative: 2 % (ref 0–5)
Lymphocytes Relative: 40 % (ref 12–46)
Lymphs Abs: 4.3 10*3/uL — ABNORMAL HIGH (ref 0.7–4.0)
Monocytes Absolute: 0.6 10*3/uL (ref 0.1–1.0)
Monocytes Relative: 6 % (ref 3–12)
Neutro Abs: 5.7 10*3/uL (ref 1.7–7.7)

## 2010-06-02 LAB — GLUCOSE, CAPILLARY
Glucose-Capillary: 141 mg/dL — ABNORMAL HIGH (ref 70–99)
Glucose-Capillary: 144 mg/dL — ABNORMAL HIGH (ref 70–99)
Glucose-Capillary: 165 mg/dL — ABNORMAL HIGH (ref 70–99)
Glucose-Capillary: 167 mg/dL — ABNORMAL HIGH (ref 70–99)
Glucose-Capillary: 246 mg/dL — ABNORMAL HIGH (ref 70–99)

## 2010-06-02 LAB — BASIC METABOLIC PANEL
CO2: 24 mEq/L (ref 19–32)
CO2: 25 mEq/L (ref 19–32)
Calcium: 8.6 mg/dL (ref 8.4–10.5)
Creatinine, Ser: 0.69 mg/dL (ref 0.4–1.2)
GFR calc Af Amer: >60 mL/min (ref >60)
Glucose, Bld: 179 mg/dL — ABNORMAL HIGH (ref 70–99)
Potassium: 4 mEq/L (ref 3.5–5.1)
Sodium: 139 mEq/L (ref 135–145)

## 2010-06-02 LAB — CBC
HCT: 40.2 % (ref 36.0–46.0)
Hemoglobin: 13 g/dL (ref 12.0–15.0)
MCH: 26.4 pg (ref 26.0–34.0)
MCH: 26.3 pg (ref 26.0–34.0)
MCHC: 32.3 g/dL (ref 30.0–36.0)
MCV: 82.1 fL (ref 78.0–100.0)
Platelets: 189 10*3/uL (ref 150–400)
Platelets: 218 10*3/uL (ref 150–400)
RBC: 4.74 MIL/uL (ref 3.87–5.11)
RBC: 4.91 MIL/uL (ref 3.87–5.11)
WBC: 10.8 10*3/uL — ABNORMAL HIGH (ref 4.0–10.5)

## 2010-06-02 LAB — HEMOGLOBIN A1C: Mean Plasma Glucose: 171 mg/dL — ABNORMAL HIGH (ref <117)

## 2010-06-02 LAB — POCT I-STAT, CHEM 8
BUN: 14 mg/dL (ref 6–23)
Chloride: 104 mEq/L (ref 96–112)
Creatinine, Ser: 0.7 mg/dL (ref 0.4–1.2)
Potassium: 3.8 mEq/L (ref 3.5–5.1)
Sodium: 138 mEq/L (ref 135–145)

## 2010-06-02 LAB — URINE MICROSCOPIC-ADD ON

## 2010-06-02 LAB — POCT PREGNANCY, URINE: Preg Test, Ur: NEGATIVE

## 2010-06-02 NOTE — Discharge Summary (Signed)
NAMEHADIYAH, [name]               ACCOUNT NO.:  [id number]  MEDICAL RECORD NO.:  [id number]           PATIENT TYPE:  I  LOCATION:  1311                         FACILITY:  [facility]  PHYSICIAN:  Pincus Large, MD     DATE OF BIRTH:  [birth year]  DATE OF ADMISSION:  05/08/2010 DATE OF DISCHARGE:  05/12/2010                              DISCHARGE SUMMARY   REASON FOR ADMISSION:  Headache, left-sided weakness, and slurring speech  DISCHARGE DIAGNOSES: 1. Left-sided weakness and headache, hemiplegic, migraine versus     conversion disorder. 2. History of conversion disorder. 3. Hypertension. 4. Attention-deficit hyperactivity disorder. 5. Diabetes type 2. 6. Obstructive sleep apnea. 7. Anxiety. 8. History of bipolar. 9. History of gastroesophageal reflux disease.  DISCHARGE MEDICATIONS: 1. Fioricet 1 tablet q.6 p.r.n. 2. Amaryl 2 mg by mouth daily. 3. Metformin ER 1000 by mouth daily. 4. Migranal, dihydroergotamine mesylate 2 mg 1 spray nasally daily as     needed.  Do not exceed 2 doses in 24 hours. 5. Nasacort 1 spray nasally daily as needed. 6. Abilify 5 mg p.o. b.i.d. 7. Oxycodone/APAP 5/325 one tablet by mouth every six hours as needed. 8. Propranolol 10 mg 2 tablets by mouth daily. 9. Promethazine 25 mg 1 tablet by mouth every 6 hours as needed. 10.Tramadol 37.5/25 mg 1 tablet q.6. 11.Vilazodone 40 mg by mouth daily. 12.Vitamin D3 1000 units 1 tablet daily. 13.Victoza 2.5 mg one injection subcutaneously daily. 14.Xanax 1 mg by mouth 3 times a day.  CONSULTATION:  Consult that was done; psychiatric consult of Dr. [name].  Chest x-ray was done on May 08, 2010, which shows no active cardiopulmonary disease.  BRIEF HISTORY:  This is a 35 year old female who was admitted because she had an episode of left-sided weakness, slurring speech and drooling of left angle of the mouth.  The patient was brought to the hospital by her husband.  The patient has  multiple admissions for the same problem and she was seen by neurology and psychiatry and they diagnosed her with possible migraine with hemiparesis versus conversion disorder.  The patient was given IV Dilaudid and oral Phenergan in the ER and she was admitted to [facility].  HOSPITAL COURSE BY PLAN: 1. Migraine headache, question of hemiplegic variant.  The patient was     admitted to the daily floor.  She received IV Dilaudid p.r.n.,     Phenergan p.r.n. and she had a PT consult.  Her symptoms did     improve over 72 hours.  She was able to move her left upper and     lower extremity.  She was seen by Psych and Psych changed her     Ability to 5 mg p.o. b.i.d.  The patient also was told by Psych     that she needs to relax herself.  She was educated to continue     counseling in the outpatient setting and follow up with Dr. [name] as     an outpatient setting. 2. Hypertension.  The patient's blood pressure was well controlled in     the hospital and she was continued  on her home medication. 3. History of bipolar disorder.  The patient is going to continue with     her Xanax as an outpatient. 4. History of gastroesophageal reflux.  The patient will continue     Protonix. 5. Obstructive sleep apnea.  She is having CPAP at home.  She will     continue the CPAP at home.  On the day of discharge, the patient's vitals were temperature was 97.6, pulse was 66, respirations 15, blood pressure was 133/76. GENERAL:  She is awake, oriented x3, lying in bed,  in no acute distress. HEENT:  Normocephalic, atraumatic.  Conjunctivae pink.  Pupils equal and reactive to light. HEART:  S1 and S2 normal without any rub. ABDOMEN:  Soft.  Bowel sounds present. EXTREMITIES:  Moving all 4 extremities. NEURO:  Motor 5/5 in all 4 extremities.  Cranial nerves II through XII are intact.  DISPOSITION:  Home.  DIET:  A 2 g sodium.  FOLLOWUP: 1. Recommend outpatient followup with Dr. [name] for  counseling 2. Follow up with her primary care Dr. [name] in 1 week.          ______________________________ Pincus Large, MD     SA/MEDQ  D:  05/12/2010  T:  05/12/2010  Job:  [id number]  Electronically Signed by Pincus Large MD on 06/02/2010 05:59:12 PM

## 2010-06-03 LAB — CBC
MCV: 80.6 fL (ref 78.0–100.0)
Platelets: 222 10*3/uL (ref 150–400)
RDW: 14 % (ref 11.5–15.5)
WBC: 10 10*3/uL (ref 4.0–10.5)

## 2010-06-03 LAB — DIFFERENTIAL
Basophils Absolute: 0 10*3/uL (ref 0.0–0.1)
Eosinophils Relative: 1 % (ref 0–5)
Lymphocytes Relative: 30 % (ref 12–46)
Neutro Abs: 6.3 10*3/uL (ref 1.7–7.7)

## 2010-06-03 LAB — URINALYSIS, ROUTINE W REFLEX MICROSCOPIC
Ketones, ur: NEGATIVE mg/dL
Nitrite: NEGATIVE
pH: 5.5 (ref 5.0–8.0)

## 2010-06-03 LAB — POCT I-STAT, CHEM 8
Calcium, Ion: 1.01 mmol/L — ABNORMAL LOW (ref 1.12–1.32)
Glucose, Bld: 182 mg/dL — ABNORMAL HIGH (ref 70–99)
HCT: 40 % (ref 36.0–46.0)
Hemoglobin: 13.6 g/dL (ref 12.0–15.0)
Potassium: 4.1 mEq/L (ref 3.5–5.1)
TCO2: 24 mmol/L (ref 0–100)

## 2010-06-05 LAB — COMPREHENSIVE METABOLIC PANEL
AST: 15 U/L (ref 0–37)
Albumin: 3.8 g/dL (ref 3.5–5.2)
Calcium: 9.1 mg/dL (ref 8.4–10.5)
Creatinine, Ser: 0.68 mg/dL (ref 0.4–1.2)
GFR calc Af Amer: >60 mL/min (ref >60)
Total Protein: 7 g/dL (ref 6.0–8.3)

## 2010-06-05 LAB — URINE MICROSCOPIC-ADD ON

## 2010-06-05 LAB — DIFFERENTIAL
Eosinophils Relative: 1 % (ref 0–5)
Lymphocytes Relative: 33 % (ref 12–46)
Lymphs Abs: 4 10*3/uL (ref 0.7–4.0)
Monocytes Absolute: 0.7 10*3/uL (ref 0.1–1.0)
Neutro Abs: 7.2 10*3/uL (ref 1.7–7.7)

## 2010-06-05 LAB — URINALYSIS, ROUTINE W REFLEX MICROSCOPIC
Bilirubin Urine: NEGATIVE
Hgb urine dipstick: NEGATIVE
Nitrite: NEGATIVE
Specific Gravity, Urine: 1.026 (ref 1.005–1.030)
pH: 6 (ref 5.0–8.0)

## 2010-06-05 LAB — CBC
MCH: 26.1 pg (ref 26.0–34.0)
MCHC: 32.3 g/dL (ref 30.0–36.0)
Platelets: 245 10*3/uL (ref 150–400)
RBC: 4.9 MIL/uL (ref 3.87–5.11)
RDW: 13.5 % (ref 11.5–15.5)

## 2010-06-05 LAB — GLUCOSE, CAPILLARY: Glucose-Capillary: 121 mg/dL — ABNORMAL HIGH (ref 70–99)

## 2010-06-05 LAB — LIPASE, BLOOD: Lipase: 30 U/L (ref 11–59)

## 2010-06-06 LAB — BASIC METABOLIC PANEL
Calcium: 9 mg/dL (ref 8.4–10.5)
GFR calc Af Amer: >60 mL/min (ref >60)
GFR calc non Af Amer: >60 mL/min (ref >60)
Glucose, Bld: 141 mg/dL — ABNORMAL HIGH (ref 70–99)
Potassium: 4.2 mEq/L (ref 3.5–5.1)
Sodium: 139 mEq/L (ref 135–145)

## 2010-06-06 LAB — POCT I-STAT, CHEM 8
BUN: 14 mg/dL (ref 6–23)
Calcium, Ion: 1.12 mmol/L (ref 1.12–1.32)
Chloride: 104 mEq/L (ref 96–112)
HCT: 43 % (ref 36.0–46.0)
Potassium: 3.7 mEq/L (ref 3.5–5.1)

## 2010-06-06 LAB — GLUCOSE, CAPILLARY: Glucose-Capillary: 120 mg/dL — ABNORMAL HIGH (ref 70–99)

## 2010-06-06 LAB — URINALYSIS, ROUTINE W REFLEX MICROSCOPIC
Glucose, UA: NEGATIVE mg/dL
Ketones, ur: NEGATIVE mg/dL
Nitrite: NEGATIVE
Specific Gravity, Urine: 1.017 (ref 1.005–1.030)
pH: 5.5 (ref 5.0–8.0)

## 2010-06-06 LAB — POCT PREGNANCY, URINE: Preg Test, Ur: NEGATIVE

## 2010-06-06 LAB — CBC
HCT: 42.6 % (ref 36.0–46.0)
Hemoglobin: 14 g/dL (ref 12.0–15.0)
MCHC: 32.9 g/dL (ref 30.0–36.0)
RBC: 5.22 MIL/uL — ABNORMAL HIGH (ref 3.87–5.11)
WBC: 11.3 10*3/uL — ABNORMAL HIGH (ref 4.0–10.5)

## 2010-06-06 LAB — DIFFERENTIAL
Basophils Relative: 0 % (ref 0–1)
Lymphocytes Relative: 29 % (ref 12–46)
Monocytes Absolute: 0.6 10*3/uL (ref 0.1–1.0)
Monocytes Relative: 6 % (ref 3–12)
Neutro Abs: 7.2 10*3/uL (ref 1.7–7.7)
Neutrophils Relative %: 63 % (ref 43–77)

## 2010-06-08 NOTE — H&P (Signed)
[name], [name]               ACCOUNT NO.:  [id number]  MEDICAL RECORD NO.:  [id number]           PATIENT TYPE:  E  LOCATION:  WLED                         FACILITY:  [facility]  PHYSICIAN:  Marcellus Scott, MD     DATE OF BIRTH:  07/23/1975  DATE OF ADMISSION:  05/08/2010 DATE OF DISCHARGE:                             HISTORY & PHYSICAL   PRIMARY CARE PHYSICIAN:  [name], MD  CHIEF COMPLAINT:  Headache, left-sided weakness and slurred speech.  HISTORY OF PRESENTING ILLNESS:  [name] is a 35 year old Caucasian female patient with history of hemiplegic migraine, type 2 diabetes mellitus, hypertension, depression who was recently discharged from the hospital on May 03, 2010.  She was evaluated by the neurologist and the psychiatrist on that admission.  It was determined that her left- sided weakness and speech abnormality were not organic, but functional versus psychogenic versus conversion disorder related.  The patient indicates that she was fine until she went to bed last night and then when she woke up this morning, she noticed left-sided weakness, slurred speech, and drooling from the left angle of the mouth.  She claimed she went to see a physician to complete some disability paperwork and there she noticed worsening of her left-sided weakness, slurred speech, and drooling from the left angle of the mouth and associated left-sided headache.  Her spouse then brought her to the emergency room.  In the emergency room, she vomited x2.  She was given IV Dilaudid and oral Phenergan.  She says she has 9/10 left-sided headache, but does not look in discomfort and there is no photophobia.  She denies any other complaints.  The [facility] were consulted by the ED physicians to admit this patient because she is unable to walk at this time.  PAST MEDICAL HISTORY: 1. Hemiplegic migraine. 2. 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 3. Obesity. 4. Type 2 diabetes mellitus. 5. Hypertension. 6. Obstructive sleep apnea. 7. Depression.  PAST SURGICAL HISTORY: 1. Cholecystectomy. 2. Tonsillectomy. 3. Cesarean section.  ALLERGIES:  The patient carries multiple allergies including, 1. HYDROXYZINE. 2. CEPHALOSPORINS. 3. CODEINE. 4. CHLORPROMAZINE. 5. MEPERIDINE. 6. MORPHINE SULFATE. 7. PENICILLINS. 8. METOCLOPRAMIDE. 9. SULFA. 10.ONDANSETRON.  HOME MEDICATIONS: 1. Victoza 1.2 mg subcutaneously daily. 2. Xanax 1 mg p.o. t.i.d. 3. Vitamin D3 1000 units p.o. daily. 4. Viibryd 40 mg p.o. daily. 5. Tramadol hydrochloride/acetaminophen, 37.5 mg/325 mg, 1 tablet p.o.     q.6 hourly p.r.n. for pain. 6. Protonix 40 mg p.o. daily. 7. Propranolol 20 mg p.o. daily. 8. Promethazine 25 mg p.o. q.6 hourly p.r.n. for nausea. 9. Oxycodone/acetaminophen, 5 - 3/25 mg tablet, 1 p.o. q.6 hourly     p.r.n. 10.Nasacort AQ, 1 spray daily p.r.n. for congestion. 11.Migranal 2 mg 1 spray daily p.r.n. for headache. 12.Glumetza ER 1000 mg p.o. daily. 13.Amaryl 2 mg p.o. daily. 14.Fioricet 1 tablet p.o. q.6 hourly p.r.n. for headache. 15.Abilify 2 mg p.o. b.i.d. 16.Adderall 20 mg p.o. t.i.d.  SOCIAL HISTORY:  The patient is married.  She lives with her husband and is independent of activities of daily  living.  She has no history of smoking, alcohol, or drug abuse.  FAMILY HISTORY:  The patient's mother has history of migraine.  Brother has sleep apnea.  History of cancer in maternal grandfather, maternal grandmother, paternal grandfather, and maternal aunt.  History of diabetes, depression, and strokes in the family.  Also family history of Alzheimer disease.  The patient currently does not work.  Advance directives, full code.  REVIEW OF SYSTEMS:  All systems reviewed and apart from history of presenting illness is negative.  PHYSICAL EXAMINATION:  GENERAL:  Ms.  [name] is a moderately built and obese female patient who is in no obvious distress. VITAL SIGNS:  Temperature 98 degrees Fahrenheit, blood pressure 112/67 mmHg, pulse 83 per minute, respiration 18 per minute, and saturating at 94% on room air. HEENT:  Nontraumatic, normocephalic.  Pupils equally reacting to light and accommodation. NECK:  Supple.  No JVD or carotid bruit. LYMPHATICS:  No lymphadenopathy. RESPIRATORY SYSTEM:  Clear to auscultation.  No increased work of breathing. CARDIOVASCULAR SYSTEM:  First and second heart sounds heard.  Regular rate and rhythm.  No murmurs or JVD. ABDOMEN:  Nondistended, nontender, soft, bowel sounds present.  No organomegaly or mass appreciated. CENTRAL NERVOUS SYSTEM:  The patient is awake, alert, oriented x3.  The patient has questionable facial asymmetry where left angle of the mouth seems to be the pull towards the left.  She has some dysarthria. EXTREMITIES:  With grade 5/5 power on the right.  The left lower extremity at least grade 1/5.  Left upper extremity although suggestive of weakness when this dictator lifts the left arm and lets it go, it does not flop down on her face, but avoids hitting her face and falls to the chest.  No cyanosis, clubbing, or edema.  Peripheral pulses symmetrically felt. SKIN:  Without any rashes. PSYCHIATRIC:  The patient has a flat affect, but denies any suicidal or homicidal ideations.  LABORATORY DATA:  Urinalysis not suggestive of urinary tract infection. Basic metabolic panel only remarkable for glucose of 154.  CBC is within normal limits.  Chest x-ray has no active cardiopulmonary disease.  ASSESSMENT AND PLAN: 1. Migraine headaches, question hemiplegic variant.  We will admit the     patient to a regular bed for 23-hour observation.  We will provide     IV Dilaudid p.r.n., Phenergan p.r.n. for nausea and oral pain     medications.  We will hold her Fioricet and Migranal and monitor. 2. Left  hemiparesis and speech disorder.  This has been extensively     evaluated in the past by multiple MRIs, neurology consultation, and     has been determined to have a functional component versus     psychogenic versus conversion disorder.  Bed rest, but no further     evaluation at this time. 3. Hypertension.  Continue home medications.  Controlled. 4. Type 2 diabetes mellitus.  We will hold her oral hypoglycemic agent     and her Victoza secondary to this episode of vomiting which may be     related to headache.  We will provide her with sliding scale     insulin and a diabetic diet as tolerated. 5. Full code status.  Time taken in coordinating this admission was 45 minutes.     Marcellus Scott, MD     AH/MEDQ  D:  05/08/2010  T:  05/08/2010  Job:  [id number]  cc:   [name], MD Fax: [phone]  Electronically Signed by Theadora Rama  Dyson Sevey MD on 06/08/2010 11:20:06 PM

## 2010-06-16 ENCOUNTER — Emergency Department (HOSPITAL_COMMUNITY)
Admission: EM | Admit: 2010-06-16 | Discharge: 2010-06-16 | Disposition: A | Discharge status: Home / Self Care | Payer: BC Managed Care – PPO | Attending: Emergency Medicine | Admitting: Emergency Medicine

## 2010-06-16 DIAGNOSIS — R000 Tachycardia, unspecified: Secondary | ICD-10-CM | POA: Insufficient documentation

## 2010-06-16 DIAGNOSIS — E119 Type 2 diabetes mellitus without complications: Secondary | ICD-10-CM | POA: Insufficient documentation

## 2010-06-16 DIAGNOSIS — R51 Headache: Secondary | ICD-10-CM | POA: Insufficient documentation

## 2010-06-21 ENCOUNTER — Emergency Department (HOSPITAL_COMMUNITY): Payer: BC Managed Care – PPO

## 2010-06-21 ENCOUNTER — Inpatient Hospital Stay (HOSPITAL_COMMUNITY)
Admission: EM | Admit: 2010-06-21 | Discharge: 2010-06-26 | DRG: 025 | Disposition: A | Discharge status: Home Health | Payer: BC Managed Care – PPO | Attending: Internal Medicine | Admitting: Internal Medicine

## 2010-06-21 DIAGNOSIS — G43409 Hemiplegic migraine, not intractable, without status migrainosus: Principal | ICD-10-CM | POA: Diagnosis present

## 2010-06-21 DIAGNOSIS — E119 Type 2 diabetes mellitus without complications: Secondary | ICD-10-CM | POA: Diagnosis present

## 2010-06-21 DIAGNOSIS — F319 Bipolar disorder, unspecified: Secondary | ICD-10-CM | POA: Diagnosis present

## 2010-06-21 DIAGNOSIS — I10 Essential (primary) hypertension: Secondary | ICD-10-CM | POA: Diagnosis present

## 2010-06-21 DIAGNOSIS — G4733 Obstructive sleep apnea (adult) (pediatric): Secondary | ICD-10-CM | POA: Diagnosis present

## 2010-06-21 DIAGNOSIS — F449 Dissociative and conversion disorder, unspecified: Secondary | ICD-10-CM | POA: Diagnosis present

## 2010-06-21 DIAGNOSIS — F909 Attention-deficit hyperactivity disorder, unspecified type: Secondary | ICD-10-CM | POA: Diagnosis present

## 2010-06-21 DIAGNOSIS — K219 Gastro-esophageal reflux disease without esophagitis: Secondary | ICD-10-CM | POA: Diagnosis present

## 2010-06-21 LAB — POCT I-STAT, CHEM 8
Chloride: 102 mEq/L (ref 96–112)
Creatinine, Ser: 0.9 mg/dL (ref 0.4–1.2)
Glucose, Bld: 86 mg/dL (ref 70–99)
HCT: 46 % (ref 36.0–46.0)
Hemoglobin: 15.6 g/dL — ABNORMAL HIGH (ref 12.0–15.0)
Potassium: 3.9 mEq/L (ref 3.5–5.1)
Sodium: 139 mEq/L (ref 135–145)

## 2010-06-21 LAB — POCT CARDIAC MARKERS
CKMB, poc: <1 ng/mL — ABNORMAL LOW (ref 1.0–8.0)
Myoglobin, poc: 38.9 ng/mL (ref 12–200)
Troponin i, poc: <0.05 ng/mL (ref 0.00–0.09)

## 2010-06-21 LAB — CBC
HCT: 44.1 % (ref 36.0–46.0)
Hemoglobin: 14.1 g/dL (ref 12.0–15.0)
RBC: 5.48 MIL/uL — ABNORMAL HIGH (ref 3.87–5.11)
WBC: 9.7 10*3/uL (ref 4.0–10.5)

## 2010-06-21 LAB — DIFFERENTIAL
Basophils Absolute: 0 10*3/uL (ref 0.0–0.1)
Basophils Relative: 0 % (ref 0–1)
Lymphocytes Relative: 37 % (ref 12–46)
Neutro Abs: 5.5 10*3/uL (ref 1.7–7.7)
Neutrophils Relative %: 57 % (ref 43–77)

## 2010-06-21 LAB — GLUCOSE, CAPILLARY
Glucose-Capillary: 91 mg/dL (ref 70–99)
Glucose-Capillary: 161 mg/dL — ABNORMAL HIGH (ref 70–99)

## 2010-06-21 LAB — BASIC METABOLIC PANEL
BUN: 10 mg/dL (ref 6–23)
Calcium: 8.5 mg/dL (ref 8.4–10.5)
Chloride: 103 mEq/L (ref 96–112)
Creatinine, Ser: 0.9 mg/dL (ref 0.4–1.2)
GFR calc Af Amer: >60 mL/min (ref >60)
GFR calc non Af Amer: >60 mL/min (ref >60)

## 2010-06-22 LAB — GLUCOSE, CAPILLARY
Glucose-Capillary: 162 mg/dL — ABNORMAL HIGH (ref 70–99)
Glucose-Capillary: 95 mg/dL (ref 70–99)
Glucose-Capillary: 89 mg/dL (ref 70–99)

## 2010-06-22 LAB — CARDIAC PANEL(CRET KIN+CKTOT+MB+TROPI)
CK, MB: 0.5 ng/mL (ref 0.3–4.0)
Troponin I: <0.01 ng/mL (ref 0.00–0.06)

## 2010-06-22 LAB — HEMOGLOBIN A1C: Hgb A1c MFr Bld: 6.9 % — ABNORMAL HIGH (ref <5.7)

## 2010-06-23 LAB — CBC
HCT: 43 % (ref 36.0–46.0)
Platelets: 226 10*3/uL (ref 150–400)
RBC: 5.23 MIL/uL — ABNORMAL HIGH (ref 3.87–5.11)
RDW: 13.7 % (ref 11.5–15.5)
WBC: 9.8 10*3/uL (ref 4.0–10.5)

## 2010-06-23 LAB — CARDIAC PANEL(CRET KIN+CKTOT+MB+TROPI)
CK, MB: 0.7 ng/mL (ref 0.3–4.0)
Relative Index: INVALID (ref 0.0–2.5)
Total CK: 38 U/L (ref 7–177)
Troponin I: 0.01 ng/mL (ref 0.00–0.06)

## 2010-06-23 LAB — COMPREHENSIVE METABOLIC PANEL
ALT: 19 U/L (ref 0–35)
Albumin: 3.4 g/dL — ABNORMAL LOW (ref 3.5–5.2)
Alkaline Phosphatase: 57 U/L (ref 39–117)
Glucose, Bld: 139 mg/dL — ABNORMAL HIGH (ref 70–99)
Potassium: 5.3 mEq/L — ABNORMAL HIGH (ref 3.5–5.1)
Sodium: 138 mEq/L (ref 135–145)
Total Protein: 6.5 g/dL (ref 6.0–8.3)

## 2010-06-23 LAB — BASIC METABOLIC PANEL
Calcium: 9 mg/dL (ref 8.4–10.5)
Creatinine, Ser: 0.74 mg/dL (ref 0.4–1.2)
GFR calc Af Amer: >60 mL/min (ref >60)

## 2010-06-24 DIAGNOSIS — F3112 Bipolar disorder, current episode manic without psychotic features, moderate: Secondary | ICD-10-CM

## 2010-06-24 LAB — BASIC METABOLIC PANEL
Calcium: 8.8 mg/dL (ref 8.4–10.5)
GFR calc Af Amer: >60 mL/min (ref >60)
GFR calc non Af Amer: >60 mL/min (ref >60)
Glucose, Bld: 110 mg/dL — ABNORMAL HIGH (ref 70–99)
Sodium: 136 mEq/L (ref 135–145)

## 2010-06-24 NOTE — Consult Note (Signed)
[name], [name]               ACCOUNT NO.:  [id number]  MEDICAL RECORD NO.:  [id number]           PATIENT TYPE:  I  LOCATION:  1518                         FACILITY:  [facility]  PHYSICIAN:  Thana Farr, MD    DATE OF BIRTH:  January 15, 1976  DATE OF CONSULTATION:  06/21/2010 DATE OF DISCHARGE:                                CONSULTATION   HISTORY:  [name] is a 35 year old female with a history of hemiplegic migraine who presents with a complaint of migraine and left hemiparesis.  The patient reports that she has had a headache now for approximately a week.  At approximately 8 a.m. this morning, she developed the left hemiparesis.  She is now unable to walk.  She was brought in for evaluation.  She reports her last episode of hemiplegic migraine was approximately 1 week ago.  The patient has been evaluated in the past. There is some mention in the chart of conversion disorder.  Consult called for further recommendations.  The patient has been given Dilaudid here in the emergency department without any improvement in her pain.  PAST MEDICAL HISTORY: 1. Hemiplegic migraine. 2. Sleep apnea. 3. Depression. 4. Anxiety.  SOCIAL HISTORY:  The patient has no history of alcohol, tobacco, or illicit drug abuse.  She is married.  MEDICATIONS:  Alprazolam, Adderall, spironolactone, Viibryd, Abilify, Victoza, trazodone, glimepiride, Topamax, Glumetza, Flexeril, Phenergan.  PHYSICAL EXAMINATION:  VITAL SIGNS:  Blood pressure 111/66, heart rate 97, respiratory rate 20, temperature 98.0. NEURO:  On mental status testing, the patient is alert.  Speech is slurred and dysarthric.  The patient can follow commands without difficulty.  On cranial nerve testing II, disk flat bilaterally.  Visual fields grossly intact.  III, IV, VI, extraocular movements intact.  V and VII, the patient has a left facial droop.  We will not close eyes tightly.  We will not raise eyebrows on either side.  VIII,  grossly intact.  IX and X, the patient will not open her mouth fully.  XI, bilateral shoulder shrug.  XII, the patient will not extend her tongue. On motor testing, the patient is 5/5 on the right.  Relationally, the patient gives 0/5 in both the left upper extremity and the left lower extremity.  There is no external rotation of the left lower extremity. When the left arm is lifted, the patient slowly lets the arm drift back to her face.  The patient has no reciprocal downward movement on the right, as she attempts to lift the left.  The patient reports decreased sensation on the left.  Finger-to-nose and heel-to-shin are intact on the right.  Deep tendon reflexes are 1+ throughout.  Plantar is mute on the left, there is withdrawal on the right.  LABORATORY DATA:  Sodium is 139, potassium 3.9, chloride 102, bicarb 27, BUN 12, creatinine and 0.9, glucose 86.  White blood cell count 9.7, platelet count 249, hemoglobin 15.6, hematocrit 46.0.  MR of the brain is unremarkable.  It has been reviewed.  ASSESSMENT:  [name] is a 35 year old female with hemiplegic migraine.  She clearly has some features on exam that suggest  a functional component.  Cannot rule out some underlying authentic component, though.  The patient is not ambulatory at this time.  She is complaining of severe pain.  PLAN: 1. Would not give further narcotics at this time. 2. Would start Depacon 500 mg IV q.12 h.  We will check a Depakote     level in the morning.  Would increase Depakote dose to have     therapeutic levels if required symptomatically. 3. Continue home medications. 4. PT/OT consult.          ______________________________ Thana Farr, MD     LR/MEDQ  D:  06/21/2010  T:  06/22/2010  Job:  [id number]  Electronically Signed by Thana Farr MD on 06/24/2010 06:44:29 PM

## 2010-06-24 NOTE — Consult Note (Signed)
  NAMESHAKALA, [name]               ACCOUNT NO.:  [id number]  MEDICAL RECORD NO.:  [id number]           PATIENT TYPE:  I  LOCATION:  1518                         FACILITY:  [facility]  PHYSICIAN:  [name], MD DATE OF BIRTH:  1975/11/20  DATE OF CONSULTATION:  06/24/2010 DATE OF DISCHARGE:                                CONSULTATION   REASON FOR CONSULTATION:  Conversion disorder, history of bipolar disorder.  HISTORY OF PRESENT ILLNESS:  A 35 year old female who is well-known to me, admitted on the medical floor because of the left-sided weakness and inability to move the left side.  The patient in the past has been admitted for the same complaint, and we have diagnosed her as having conversion disorder.  The patient is on Viibryd, Abilify, Adderall and Xanax.  We will continue the same combination at this time, and I told the patient to work on the relaxation techniques so that she will be able to walk.  I also told that her that she was seen by Neurology; and as per them, there is no bleed or stroke in the brain, and she is medically doing very well.  The patient is logical and goal directed during the interview.  Reported depressed mood and anxiety because of her inability to walk.  The patient was not internally preoccupied.  Denies hearing any voices, is not suicidal or homicidal.  Cognition alert, awake, oriented x3.  Memory immediate, recent, remote fair.  Attention and concentration fair. Abstraction ability good.  Insight and judgment fair.  DIAGNOSES:  As per history, bipolar disorder and conversion disorder.  RECOMMENDATIONS: 1. The patient can be stabilized for 1 or 2 days more in the hospital,     and she can be continued on her current psych medications; Viibryd     40 mg once a day, Abilify 5 mg twice a day, Adderall 20 mg t.i.d.     and Xanax 1 mg t.i.d. as needed for anxiety. 2. I also discussed with medical doctor for 1 or 2 days of further     stay in  the hospital. 3. I will follow up on this patient as needed.     [name], MD     SA/MEDQ  D:  06/24/2010  T:  06/24/2010  Job:  [id number]  Electronically Signed by [name]  on 06/24/2010 07:11:13 PM

## 2010-06-25 LAB — BASIC METABOLIC PANEL
BUN: 12 mg/dL (ref 6–23)
Calcium: 8.8 mg/dL (ref 8.4–10.5)
Creatinine, Ser: 0.78 mg/dL (ref 0.4–1.2)
GFR calc non Af Amer: >60 mL/min (ref >60)

## 2010-06-25 LAB — GLUCOSE, CAPILLARY: Glucose-Capillary: 113 mg/dL — ABNORMAL HIGH (ref 70–99)

## 2010-06-26 DIAGNOSIS — F3112 Bipolar disorder, current episode manic without psychotic features, moderate: Secondary | ICD-10-CM

## 2010-07-01 NOTE — Discharge Summary (Signed)
[name], [name]               ACCOUNT NO.:  [id number]  MEDICAL RECORD NO.:  [id number]           PATIENT TYPE:  I  LOCATION:  1518                         FACILITY:  [facility]  PHYSICIAN:  Altha Harm, MDDATE OF BIRTH:  11/19/1975  DATE OF ADMISSION:  06/21/2010 DATE OF DISCHARGE:  06/26/2010                              DISCHARGE SUMMARY   DISCHARGE DISPOSITION:  Home.  FINAL DISCHARGE DIAGNOSES: 1. Hemiplegic migraine, resolving. 2. Exacerbation of migraine headaches. 3. Type 2 diabetes. 4. History of bipolar disorder. 5. Conversion disorder. 6. Obstructive sleep apnea. 7. Morbid obesity. 8. Hypertension. 9. Attention deficit hyperactivity disorder. 10.Gastroesophageal reflux disease.  DISCHARGE MEDICATIONS: 1. Abilify 5 mg p.o. b.i.d. 2. Aldactone 50 mg p.o. daily. 3. Amaryl 4 mg p.o. daily. 4. Flexeril 5-10 mg p.o. q.8 h. p.r.n. muscle spasms. 5. Metformin ER 1000 mg p.o. daily. 6. Promethazine 25 mg p.o. q.6 h. p.r.n. nausea and vomiting. 7. Topamax 75 mg p.o. q.h.s. 8. Trazodone 75 mg q.h.s. 9. Vilazodone 40 mg p.o. daily. 10.Victoza 1.8 mg subcu daily at noon. 11.Xanax 1 mg p.o. t.i.d.  CONSULTANTS: 1. Thana Farr, MD, Neurology. 2. [name], MD, Psychiatry.  PROCEDURES:  None.  DIAGNOSTIC STUDIES:  MR of the brain without contrast which shows no acute infarct and minimal paranasal sinus mucosal thickening.  PRIMARY CARE PHYSICIAN:  [name], MD, at [facility]. The patient also attends the headache clinic of [facility].  CODE STATUS:  Full code.  ALLERGIES:  Multiple including the following: 1. CEPHALOSPORINS. 2. CODEINE. 3. ATARAX. 4. COMPAZINE. 5. DEMEROL. 6. MORPHINE SULFATE. 7. PENICILLINS. 8. REGLAN. 9. SULFA. 10.ZOFRAN.  CHIEF COMPLAINT:  Weakness.  HISTORY OF PRESENT ILLNESS:  Please refer to the H and P dictated by Dr. Mechele Claude for details of the HPI.  However, in short, this is a  35 year old patient who has chronic migraine, bipolar disorder, and conversion disorder.  The patient presented to the emergency room with a similar presentation that she had in March.  According to the patient when she has severe migraine headaches, she also has hemiplegia associated with it which typically resolves on its own over time.  The patient nevertheless was seen and evaluated with an MRI without evidence of any acute infarction.  There was no evidence of acute infarction.  The patient was referred to [facility] for further evaluation and management.  HOSPITAL COURSE:  Left-sided hemiplegia and migraine.  The patient had significant migraine with hemiplegia resulting.  The patient was seen by Neurology who started her on Depakote.  Within 24 hours of IV Depakote, the patient's headache had resolved; however, her hemiplegia persisted. The radiological findings and evaluation by the neurologist supported a diagnosis often is one of an organic neurological diagnosis.  Psychiatry was consulted to see the patient and agreed that the patient was exhibiting conversion disorder.  The patient was set to be sent home and then complained of a headache recurring.  She was started on p.o. Decadron and her headache diminished significantly.  The patient's hemiplegia has been improving over time.  The patient was evaluated by Physical Therapy and Occupational Therapy, both  of whom recommended short-term rehab in a skilled facility.  The patient, however, has declined and states that she usually has an insidious resolution when she has this problem occurring.  The patient thus is being sent home with Home Health Physical Therapy, Occupational Therapy.  The patient also at this point is nonambulatory and has been offered transport by medical transportation; however, she has declined and states that her husband will take her home.  PHYSICAL EXAMINATION:  GENERAL:  At the time of  discharge, the patient is stable. VITAL SIGNS:  Temperature is 97.3, heart rate 86, blood pressure 109/72, respiratory rate 20, O2 sats are 97% on room air.  CBGs are 144 to 180. HEENT EXAMINATION:  She is normocephalic, atraumatic.  Pupils are equally round, reactive to light and accommodation.  Extraocular movements are intact.  Oropharynx is moist.  No exudate, erythema, or lesions are noted. NECK EXAMINATION:  Trachea is midline.  No masses, no thyromegaly, no JVD, no carotid bruit. LUNGS:  Clear to auscultation.  No wheezing or rhonchi noted. CARDIOVASCULAR:  She has got a normal S1 and S2.  No murmurs, rubs, or gallops are noted.  PMI is nondisplaced.  No heaves or thrills on palpation. ABDOMEN:  Obese, soft, nontender, nondistended.  No masses, no hepatosplenomegaly. NEUROLOGICAL:  The patient states she is unable to move her left side; however, the patient has very controlled eccentric contractions noted with movement.  Her DTRs are 2+ bilaterally upper and lower extremities. She has no facial asymmetry. PSYCHIATRIC:  She is alert and oriented x3.  Good insight and cognition. Good recent and remote call.  DIETARY RESTRICTIONS:  The patient is on a no added salt diet and a diabetic diet.  Physical restrictions under the care of Physical Therapy.  FOLLOWUP:  The patient is to follow up with the headache clinic for an appointment that she has rescheduled from today.  She is to follow up with her primary care physician within 3-5 days.  Total time for this discharge process including face-to-face time approximately 50 minutes.     Altha Harm, MD     MAM/MEDQ  D:  06/26/2010  T:  06/27/2010  Job:  [id number]  cc:   Clayborn Bigness, MD, MPH  [name], MD Fax: [phone]  Thana Farr, MD Fax: [phone]  [name], MD  Electronically Signed by Marthann Schiller MD on 07/01/2010 08:59:08 PM

## 2010-08-07 ENCOUNTER — Emergency Department (HOSPITAL_COMMUNITY)
Admission: EM | Admit: 2010-08-07 | Discharge: 2010-08-08 | Disposition: A | Discharge status: Home / Self Care | Payer: BC Managed Care – PPO | Attending: Emergency Medicine | Admitting: Emergency Medicine

## 2010-08-07 DIAGNOSIS — Z79899 Other long term (current) drug therapy: Secondary | ICD-10-CM | POA: Insufficient documentation

## 2010-08-07 DIAGNOSIS — F329 Major depressive disorder, single episode, unspecified: Secondary | ICD-10-CM | POA: Insufficient documentation

## 2010-08-07 DIAGNOSIS — G4730 Sleep apnea, unspecified: Secondary | ICD-10-CM | POA: Insufficient documentation

## 2010-08-07 DIAGNOSIS — F411 Generalized anxiety disorder: Secondary | ICD-10-CM | POA: Insufficient documentation

## 2010-08-07 DIAGNOSIS — F3289 Other specified depressive episodes: Secondary | ICD-10-CM | POA: Insufficient documentation

## 2010-08-07 LAB — DIFFERENTIAL
Basophils Absolute: 0 10*3/uL (ref 0.0–0.1)
Eosinophils Relative: 2 % (ref 0–5)
Lymphocytes Relative: 41 % (ref 12–46)
Monocytes Absolute: 0.7 10*3/uL (ref 0.1–1.0)

## 2010-08-07 LAB — CBC
HCT: 42.1 % (ref 36.0–46.0)
MCHC: 32.5 g/dL (ref 30.0–36.0)
RDW: 14.4 % (ref 11.5–15.5)

## 2010-08-07 LAB — RAPID URINE DRUG SCREEN, HOSP PERFORMED
Amphetamines: NOT DETECTED
Barbiturates: NOT DETECTED
Benzodiazepines: NOT DETECTED
Opiates: NOT DETECTED

## 2010-08-07 LAB — COMPREHENSIVE METABOLIC PANEL
ALT: 20 U/L (ref 0–35)
BUN: 14 mg/dL (ref 6–23)
Calcium: 9 mg/dL (ref 8.4–10.5)
Creatinine, Ser: 0.67 mg/dL (ref 0.4–1.2)
Glucose, Bld: 113 mg/dL — ABNORMAL HIGH (ref 70–99)
Sodium: 134 mEq/L — ABNORMAL LOW (ref 135–145)
Total Protein: 7.1 g/dL (ref 6.0–8.3)

## 2010-08-07 LAB — ETHANOL: Alcohol, Ethyl (B): <11 mg/dL — ABNORMAL HIGH (ref 0–10)

## 2010-08-07 LAB — GLUCOSE, CAPILLARY: Glucose-Capillary: 218 mg/dL — ABNORMAL HIGH (ref 70–99)

## 2010-08-20 ENCOUNTER — Emergency Department (HOSPITAL_COMMUNITY): Payer: BC Managed Care – PPO

## 2010-08-20 ENCOUNTER — Emergency Department (HOSPITAL_COMMUNITY)
Admission: EM | Admit: 2010-08-20 | Discharge: 2010-08-20 | Disposition: A | Discharge status: Home / Self Care | Payer: BC Managed Care – PPO | Attending: Emergency Medicine | Admitting: Emergency Medicine

## 2010-08-20 DIAGNOSIS — E119 Type 2 diabetes mellitus without complications: Secondary | ICD-10-CM | POA: Insufficient documentation

## 2010-08-20 DIAGNOSIS — Z794 Long term (current) use of insulin: Secondary | ICD-10-CM | POA: Insufficient documentation

## 2010-08-20 DIAGNOSIS — F341 Dysthymic disorder: Secondary | ICD-10-CM | POA: Insufficient documentation

## 2010-08-20 DIAGNOSIS — R1032 Left lower quadrant pain: Secondary | ICD-10-CM | POA: Insufficient documentation

## 2010-08-20 DIAGNOSIS — K5289 Other specified noninfective gastroenteritis and colitis: Secondary | ICD-10-CM | POA: Insufficient documentation

## 2010-08-20 LAB — COMPREHENSIVE METABOLIC PANEL
BUN: 14 mg/dL (ref 6–23)
Calcium: 9.4 mg/dL (ref 8.4–10.5)
Creatinine, Ser: 0.65 mg/dL (ref 0.4–1.2)
GFR calc non Af Amer: >60 mL/min (ref >60)
Glucose, Bld: 292 mg/dL — ABNORMAL HIGH (ref 70–99)
Total Protein: 7.6 g/dL (ref 6.0–8.3)

## 2010-08-20 LAB — DIFFERENTIAL
Basophils Relative: 0 % (ref 0–1)
Lymphs Abs: 4 10*3/uL (ref 0.7–4.0)
Monocytes Absolute: 0.6 10*3/uL (ref 0.1–1.0)
Monocytes Relative: 5 % (ref 3–12)
Neutro Abs: 6.8 10*3/uL (ref 1.7–7.7)

## 2010-08-20 LAB — URINALYSIS, ROUTINE W REFLEX MICROSCOPIC
Leukocytes, UA: NEGATIVE
Nitrite: NEGATIVE
Specific Gravity, Urine: 1.026 (ref 1.005–1.030)
Urobilinogen, UA: 0.2 mg/dL (ref 0.0–1.0)
pH: 5.5 (ref 5.0–8.0)

## 2010-08-20 LAB — CBC
HCT: 39.7 % (ref 36.0–46.0)
Hemoglobin: 12.8 g/dL (ref 12.0–15.0)
MCHC: 32.2 g/dL (ref 30.0–36.0)
RBC: 5.02 MIL/uL (ref 3.87–5.11)

## 2010-08-20 LAB — URINE MICROSCOPIC-ADD ON

## 2010-08-20 LAB — LIPASE, BLOOD: Lipase: 25 U/L (ref 11–59)

## 2010-08-20 LAB — GLUCOSE, CAPILLARY: Glucose-Capillary: 293 mg/dL — ABNORMAL HIGH (ref 70–99)

## 2010-09-01 ENCOUNTER — Emergency Department (HOSPITAL_COMMUNITY): Payer: BC Managed Care – PPO

## 2010-09-01 ENCOUNTER — Inpatient Hospital Stay (HOSPITAL_COMMUNITY)
Admission: EM | Admit: 2010-09-01 | Discharge: 2010-09-04 | DRG: 025 | Disposition: A | Discharge status: Home Health | Payer: BC Managed Care – PPO | Attending: Family Medicine | Admitting: Family Medicine

## 2010-09-01 DIAGNOSIS — F449 Dissociative and conversion disorder, unspecified: Secondary | ICD-10-CM | POA: Diagnosis present

## 2010-09-01 DIAGNOSIS — R4789 Other speech disturbances: Secondary | ICD-10-CM | POA: Diagnosis present

## 2010-09-01 DIAGNOSIS — G43409 Hemiplegic migraine, not intractable, without status migrainosus: Principal | ICD-10-CM | POA: Diagnosis present

## 2010-09-01 DIAGNOSIS — Z794 Long term (current) use of insulin: Secondary | ICD-10-CM

## 2010-09-01 DIAGNOSIS — F988 Other specified behavioral and emotional disorders with onset usually occurring in childhood and adolescence: Secondary | ICD-10-CM | POA: Diagnosis present

## 2010-09-01 DIAGNOSIS — F341 Dysthymic disorder: Secondary | ICD-10-CM | POA: Diagnosis present

## 2010-09-01 DIAGNOSIS — R29898 Other symptoms and signs involving the musculoskeletal system: Secondary | ICD-10-CM | POA: Diagnosis present

## 2010-09-01 DIAGNOSIS — E119 Type 2 diabetes mellitus without complications: Secondary | ICD-10-CM | POA: Diagnosis present

## 2010-09-01 LAB — CBC
HCT: 36 % (ref 36.0–46.0)
Hemoglobin: 11.2 g/dL — ABNORMAL LOW (ref 12.0–15.0)
MCH: 24.6 pg — ABNORMAL LOW (ref 26.0–34.0)
MCHC: 31.1 g/dL (ref 30.0–36.0)
MCV: 79.1 fL (ref 78.0–100.0)
RBC: 4.55 MIL/uL (ref 3.87–5.11)

## 2010-09-01 LAB — DIFFERENTIAL
Basophils Relative: 0 % (ref 0–1)
Lymphocytes Relative: 34 % (ref 12–46)
Lymphs Abs: 3.5 10*3/uL (ref 0.7–4.0)
Monocytes Absolute: 0.5 10*3/uL (ref 0.1–1.0)
Monocytes Relative: 5 % (ref 3–12)
Neutro Abs: 6 10*3/uL (ref 1.7–7.7)
Neutrophils Relative %: 58 % (ref 43–77)

## 2010-09-01 LAB — BASIC METABOLIC PANEL
BUN: 14 mg/dL (ref 6–23)
Chloride: 99 mEq/L (ref 96–112)
GFR calc Af Amer: >60 mL/min (ref >60)
Potassium: 3.9 mEq/L (ref 3.5–5.1)

## 2010-09-02 LAB — GLUCOSE, CAPILLARY
Glucose-Capillary: 130 mg/dL — ABNORMAL HIGH (ref 70–99)
Glucose-Capillary: 110 mg/dL — ABNORMAL HIGH (ref 70–99)
Glucose-Capillary: 210 mg/dL — ABNORMAL HIGH (ref 70–99)

## 2010-09-03 LAB — GLUCOSE, CAPILLARY
Glucose-Capillary: 147 mg/dL — ABNORMAL HIGH (ref 70–99)
Glucose-Capillary: 150 mg/dL — ABNORMAL HIGH (ref 70–99)

## 2010-09-04 LAB — GLUCOSE, CAPILLARY

## 2010-09-04 NOTE — H&P (Signed)
NAMEMarland [name]  [name], [name]:  [id number]  MEDICAL RECORD NO.:  [id number]  LOCATION:  WLED                         FACILITY:  [facility]  PHYSICIAN:  Houston Siren, MD           DATE OF BIRTH:  July 07, 1975  DATE OF ADMISSION:  09/01/2010 DATE OF DISCHARGE:                             HISTORY & PHYSICAL   PRIMARY CARE PHYSICIAN:  [name], MD, [facility].  PSYCHIATRIST:  Milagros Evener, M.D.  ADVANCE DIRECTIVE:  Full code.  REASON FOR ADMISSION:  Headache and hemiplegia.  HISTORY OF PRESENT ILLNESS:  This is a 35 year old female with a history of bipolar disorder, ADD, anxiety, sleep apnea, known hemiplegic migraine, and conversion disorder, presents once more to the emergency room complaining of vertex headache with left hemiplegia and slurred speech.  She had the exact symptoms and was admitted in March 2012 for a similar diagnosis.  Workup in the emergency room showed negative head CT, normal WBC, and blood glucose of 182.  She has normal creatinine. In the past, she was seen in consultation by Psychiatry and Neurology and was given these diagnoses.  She was treated with Depakote at that time with improvement of her symptoms.  Currently, she is on Topamax.  It should be noted that her last hospitalization, an MRI did not show any infarct and her symptoms over time resolved completely.  PAST MEDICAL HISTORY: 1. Diabetes. 2. Anxiety. 3. ADD. 4. Depression. 5. GERD. 6. Sleep apnea. 7. Hemiplegic migraine. 8. Conversion disorder.  SOCIAL HISTORY:  She is married with 3 children.  ALLERGIES: 1. PENICILLIN. 2. SULFA. 3. CEPHALOSPORIN. 4. MORPHINE. 5. CODEINE. 6. DEMEROL. 7. DECADRON. 8. ZOFRAN. 9. ATARAX. 10.COMPAZINE. 11.REGLAN.  CURRENT MEDICATIONS: 1. Adderall 20 mg t.i.d. 2. Xanax 1 mg t.i.d. 3. Pristiq. 4. Glumetza. 5. Victoza. 6. Glimepiride. 7. Abilify. 8. Topamax. 9. Thorazine.  REVIEW OF SYSTEMS:  Unable.  PHYSICAL  EXAMINATION:  VITAL SIGNS:  Blood pressure 103/68, pulse of 86, respiratory rate of 20, temperature 97.9. GENERAL:  She is alert and oriented.  She does have slurred speech and unable to communicate. HEENT:  Tongue is midline.  She does have facial asymmetry.  Her left side is hemiplegic, right side is able to move well. CARDIAC:  S1 and S2, regular. LUNGS:  Clear. SKIN:  Warm and dry. PSYCHIATRIC:  Unremarkable.  OBJECTIVE FINDINGS:  White count of 10.2 thousand, hemoglobin of 11.2, MCV of 79, platelet count 215,000.  Serum sodium 133, potassium 3.9, blood glucose 182, creatinine 0.53.  Head CT was negative.  IMPRESSION:  This is a 35 year old with known psychiatric disorder and having hemiplegic migraine along with conversion disorder presents with another similar episode.  We will admit her for observation, continue her medications.  I will give her some narcotic IV for her headache, but will try to avoid as much as possible.  As soon as her medications are reconciled, we will continue them.  She is a full code, will be admitted to [facility].     Houston Siren, MD     PL/MEDQ  D:  09/02/2010  T:  09/02/2010  Job:  [id number]  Electronically  Signed by Houston Siren  on 09/04/2010 09:34:08 PM

## 2010-09-04 NOTE — H&P (Signed)
[name], [name]               ACCOUNT NO.:  [id number]  MEDICAL RECORD NO.:  [id number]           PATIENT TYPE:  E  LOCATION:  WLED                         FACILITY[facility]  [facility]  PHYSICIAN:  Clayborn Bigness, MD, MPH  DATE OF BIRTH:  09-13-1975  DATE OF ADMISSION:  06/21/2010 DATE OF DISCHARGE:                             HISTORY & PHYSICAL   PRIMARY CARE PHYSICIAN:  [facility].  PSYCHIATRIST:  Milagros Evener, MD  CHIEF COMPLAINT:  Weakness.  HISTORY OF PRESENT ILLNESS:  This is a 35 year old female who presents with left-sided weakness and inability to move the left side.  She states that this morning she was driving to take her daughter to school and began to have headache.  When she got to the school, she was noticing that she was feeling weaker.  She was able to walk up some stairs, but by the time she got to the top of the stairs she was really unable to move.  She contacted her husband and  called EMS.  She also has difficulty speaking and described the sensation of numbness and tingling throughout her left side, again the headache is located in the top of her head.  She has a past medical history of hemiplegic migraine and conversion disorder and she states that to the ED doctors that this was similar to her previous migraines, but more intense.  In the emergency room, she was requesting Dilaudid, stating that this was the only thing that helped her in the past.  PAST MEDICAL HISTORY:  Again is significant for, 1. Hemiplegic migraine. 2. Conversion disorder. 3. Hypertension. 4. Attention deficit and hyperactivity disorder. 5. Type 2 diabetes. 6. Obstructive sleep apnea. 7. Anxiety. 8. Bipolar disorder. 9. Gastroesophageal reflux disease.    FAMILY HISTORY:  She has a family history that is significant for mother with migraines, a brother with sleep apnea, history of cancer in maternal grandparents as well as a maternal aunt.Also  significant for diabetes, depression, and stroke as well as Alzheimer disease.    SOCIAL HISTORY: The patient is married.  She lives with her husband and children.  She is not currently working.  She is not a smoker, does not drink alcohol, or use drugs.  DRUG ALLERGIES:  Significant for, 1. PENICILLIN. 2. SULFA. 3. CEPHALOSPORINS. 4. MORPHINE. 5. CODEINE. 6. DEMEROL. 7. DECADRON. 8. ZOFRAN. 9. ATARAX. 10.COMPAZINE. 11.REGLAN  All of which cause anaphylaxis per report.  CURRENT MEDICATIONS: 1. Alprazolam 1 mg t.i.d. 2. Adderall 20 mg t.i.d. 3. Viibryd 40 mg once a day. 4. Abilify 5 mg twice a day. 5. Victoza subcutaneous. 6. Trazodone 50 mg once a day. 7. Glimepiride 4 mg once a day. 8. Topamax 75 mg once a day. 9. Glumetza 1000 mg once a day. 10.Flexeril. 11.Phenergan 25 mg once a day.  PHYSICAL EXAMINATION:  VITAL SIGNS:  In the emergency department, her temperature was 98, pulse was 97, respiratory rate was 20, blood pressure was 111/66. GENERAL:  She was an obese female who was lying on the stretcher, in no apparent distress.  Her speech was dysarthric. HEENT:  Significant for  dilated pupils bilaterally which were reactive to light and accommodation, although she stated that it was painful when the light was shown in her eyes.  Her oropharynx was clear with moist mucosal membranes. NECK:  Supple with no lymphadenopathy. CARDIOVASCULAR:  Significant for regular heart rate and rhythm.  No murmurs, rubs, or gallops. PULMONARY:  Clear to auscultation bilaterally.  No wheezing, rales, or rhonchi. ABDOMINAL:  Soft and nontender.  No masses felt. EXTREMITIES:  No lower extremity edema. SKIN:  Warm, dry, and intact. NEUROLOGIC:  Again her speech was dysarthric.  She was unable to lift her left upper extremity or lower extremity on her own.  However, I was able to lift those passively and she would just drop her extremities once I removed my hand.  She did have  positive patellar reflexes bilaterally.  Her proprioception to movement of her left foot was poor. She had negative Babinski on the left.  She has a slight facial asymmetry when asked to smile.  She was unable to shrug her left shoulder.  Her neurologic exam on the right side was completely intact and she had normal strength and movement on that side.  LABORATORY DATA:  Significant for a myoglobin which was normal.  CK MB which was normal.  Troponin normal.  CBC which was normal.  Urine pregnancy test was negative.  Her chemistries were unremarkable.  Her ionized calcium was 1.1 which is very slightly low.  MRI of the brain was negative.  She had an EKG which was normal sinus rhythm at 97, otherwise normal.  ASSESSMENT AND PLAN: 1. This is a 35 year old female who is presenting with left-sided     paralysis, thought to be secondary to a hemiplegic migraine versus     a conversion disorder.  She was seen by the neurologist (Dr.     Thad Ranger) in the emergency department who thought that this might     represent hemiplegic migraine, however, she thought that she also     had functional symptoms, i.e. that this might be psychiatric     etiology.  She suggested that she be treated with Depakote IV 500     mg q.12, have a PT and OT consult and that she should not be given     any further narcotics for her pain control.  Will use Tylenol for     mild pain and tramadol for moderate or severe pain.  Of note, given     her multiple drug allergies, she has been given Dilaudid primarily     in the past and that is what she received in the ED; however, we     would not continue that per Neurology request and also she did not     seem to complain of significant pain outside of the headache.  We     will check a Depakote level in the morning. 2. Psychiatric problems (bipolar disorder, anxiety disorder conversion     disorder).  We will continue her regular medications which include     alprazolam,  Topamax, Abilify, Zybrid, Victoza. 3. Attention deficit hyperactivity disorder.  We will continue her     Adderall. 4. Type 2 diabetes.  We will continue her oral diabetes medications     which include Glumetza and glimepiride.  We will also check     hemoglobin A1c in the morning and she will be on a sliding scale     insulin per protocol.  ______________________________ Clayborn Bigness, MD, MPH     CC/MEDQ  D:  06/21/2010  T:  06/21/2010  Job:  [id number]  cc:   [facility]  Electronically Signed by Clayborn Bigness MD on 09/04/2010 08:25:04 AM

## 2010-09-06 NOTE — Discharge Summary (Signed)
NAMEARASELI, [name].:  [id number]  MEDICAL RECORD [name].:  [id number]  LOCATION:  1314                         FACILITY:  [facility]  PHYSICIAN:  Brendia Sacks, MD    DATE OF BIRTH:  1975/08/31  DATE OF ADMISSION:  09/01/2010 DATE OF DISCHARGE:  09/04/2010                              DISCHARGE SUMMARY   CONDITION ON DISCHARGE:  Improved.  DISPOSITION:  Home with home health physical therapy.  DISCHARGE DIAGNOSES: 1. Left upper and left lower extremity weakness, recurrent,     multifactorial in nature. 2. History of hemiplegic migraines. 3. History of conversion disorder. 4. Anxiety and depression. 5. Diabetes mellitus type 2.  HISTORY OF PRESENT ILLNESS:  This is a 35 year old woman, who presented to the Emergency Room with headache, hemiplegia and slurred speech. Patient was admitted from the emergency room for further evaluation and neurology consultation.  HOSPITAL COURSE: 1. Left upper and left lower extremity weakness:  Recurrent.  This     patient is evaluated multiple times in the past by Neurology     including on March 14, 2010, May 01, 2010, June 21, 2010     and September 02, 2010.  The clinical impression has been hemiplegic     migraines with suspected functional weakness and non-organic cause.     She has been imaged in the past with CT and MRI.  CT of the head at     this admission was negative.  Neurology recommended [name] further     evaluation.  She has also been evaluated by Psychiatry in the past     on May 01, 2010, May 09, 2010 and June 24, 2010.     Diagnoses include conversion disorder, major depressive disorder     and anxiety.  Patient had recurrent episodes of headaches, which     cause a left-sided weakness including the left upper and lower     extremity as well as slurred speech.  On admission, she was found     to have similar symptoms, which are felt to have non-organic     component.  The patient was seen by  Physical Therapy and Home     Health as recommended.  The patient has had marked improvement from     yesterday.  Slurred speech has resolved.  Strength in the upper     extremity has markedly improved.  She is sitting on the side of the     bed spontaneously.  Strength overall is improved.  She would like     to go home and appears to be stable to go home. 2. Anxiety and depression:  [name] changes in her medications at this     time. 3. Diabetes mellitus type 2:  Continue her multiple medications     including insulin, Victoza, Amaryl, and metformin.  CONSULTATIONS:  Neurology:  Their recommendations were supportive care, consideration for Depakote, Thorazine.  [name] further neurologic evaluation recommended.  IMAGING:  CT of the head on September 01, 2010:  Negative.  MICROBIOLOGY:  None.  ANCILLARY STUDIES:  None.  PERTINENT LABORATORY STUDIES: 1. TSH within normal limits. 2. CBC essentially unremarkable. 3. Capillary blood sugars stable.  4. Basic metabolic panel essentially unremarkable. 5. Urinalysis negative.  PHYSICAL EXAMINATION ON DISCHARGE:  GENERAL:  Patient is feeling well. She would like to go home.  In exam room, she is sitting up spontaneously on the side of the bed.  She does have spontaneous movement of her left upper extremity on observation. VITAL SIGNS:  She remains afebrile.  Temperature is 97.8, pulse 88, respirations 18, blood pressure 111/73, sat 96% on room air. CARDIOVASCULAR:  Regular rate and rhythm.  [name] murmur, rub, or gallop. RESPIRATORY:  Clear to auscultation bilaterally.  [name] wheezes, rales or rhonchi.  Normal respiratory effort. EXTREMITIES:  [name] lower extremity edema. NEUROLOGIC EXAMINATION:  Speech is fluent and clear.  Cranial nerves II through XII appear to be unremarkable.  She has increased strength in her left upper extremity and observe spontaneous movement of her left lower extremity.  Effort appears to be suboptimal on examination.  DISCHARGE  INSTRUCTIONS:  The patient will be discharged home today. Diet:  Diabetic heart-healthy diet.  Activity:  Increase slowly using rolling walker, ambulating with assistance.  FOLLOWUP:  Follow up with Dr. Larwance Sachs in 1 week and with Dr. Sharl Ma in 1-2 weeks.  DISCHARGE MEDICATIONS: 1. Phenergan 25 mg p.o. q.6 h. as needed, dispense 1 week supply.     Follow up with primary care physician for further Phenergan. 2. Abilify 5 mg p.o. daily. 3. Adderall 20 mg p.o. t.i.d. 4. Amaryl 4 mg p.o. daily. 5. Ambien 10 mg p.o. q.h.s. 6. Flexeril 10 mg one-half to one tablet p.o. t.i.d. as needed for     headache. 7. Metformin 1 g p.o. daily. 8. Humulin 70/30 15 units subcu b.i.d. 9. Pristiq 50 mg XR p.o. daily. 10.Thorazine 25 mg 1-2 tablets by mouth every 4 hours as needed for     migraine rescue. 11.Topamax 25 mg 4 tablets p.o. q.h.s. 12.Victoza 1.8 mg subcutaneous daily. 13.Xanax 1 mg p.o. t.i.d.  At this point, hold the following medications:  Injected insulin aspartate as her blood sugars have been well controlled.  She can resume this medication in conjunction with her primary care physician.  Note:  I did have discussion with the patient's husband in regard to be multifactorial etiology of her medical issues.  This was with the patient's permission.  Time coordinating discharge is 35.     Brendia Sacks, MD     DG/MEDQ  D:  09/04/2010  T:  09/04/2010  Job:  [id number]  Electronically Signed by Brendia Sacks  on 09/06/2010 04:17:56 PM

## 2010-09-24 NOTE — Consult Note (Signed)
NAMELAPORCHA, MARCHESI               ACCOUNT NO.:  [id number]  MEDICAL RECORD NO.:  [id number]  LOCATION:  1314                         FACILITY:  [facility]  PHYSICIAN:  Dr. Thad Ranger           DATE OF BIRTH:  [birth year]  DATE OF CONSULTATION:  09/02/2010 DATE OF DISCHARGE:                                CONSULTATION   REASON FOR CONSULTATION:  Headache.  HISTORY OF PRESENT ILLNESS:  This is a 35 year old female with past medical history of diabetes, anxiety, ADD, depression, GERD, sleep apnea, hemiplegic, migraine, conversion disorder.  The patient states that she started having bilateral frontal headache yesterday at about 2 p.m..  This progressed significantly over time period of 4 hours.  At that time, the patient noted that she was unable to move her left arm and left leg and for that reason came to [facility] ED.  Since she has been at [facility], she has had a workup in the emergency room showing negative head CT, normal white blood cell count and blood glucose of 182.  She continues to state that her head is throbbing bilaterally in the frontal region and has significant weakness in her left upper and lower extremities.  At home, she takes Topamax for prophylactic migraine headache treatment and Tylenol for breakthrough.  Due headache not breaking over time period of 4 hours, she came to the emergency department.  At present time, the patient is still complaining of frontal headache rated a 6 out of 10.  She states that she continues to have left upper and lower extremity weakness and decreased sensation. She does state that she has been under a lot of financial stress over the last few weeks.  PAST MEDICAL HISTORY:  As mentioned above.  MEDICATIONS:  She is on Adderall, Xanax, Pristiq, Glumetza, Victoza, glimepiride, Abilify, Topamax, and Thorazine.  ALLERGIES: 1. PENICILLIN. 2. SULFA. 3. CEPHALOSPORINS. 4. MORPHINE. 5. CODEINE. 6. DEMEROL. 7. DECADRON. 8.  ZOFRAN. 9. ATARAX. 10.COMPAZINE. 11.REGLAN.  SOCIAL HISTORY:  She is married, with 3 children.  Does not smoke or drink or do illicit drugs.  REVIEW OF SYSTEMS:  Positive for headache, left upper and lower extremity weakness, generalized malaise.  Otherwise, negative for all other review of systems.  PHYSICAL EXAMINATION:  VITAL SIGNS:  Temperature is 98.4, pulse 85, respirations 20, systolic blood pressure is 109 and diastolic 67. GENERAL:  She is alert and oriented x3, carries on 2- to 3-step commands without difficulty. HEENT:  Pupils equal, round, reactive to light and accommodating, 4 mm to 2 mm.  She is conjugate.  Extraocular movements are intact.  Visual fields grossly intact.  Face symmetrical.  Tongue is midline.  Uvula is midline.  The patient has a moaning light speech but it is clear with good enunciation.  Facial sensation is stated to be decreased on the left side with a clear-cut midline split at the forehead region, nose, and chin.  Coordination of finger-to-nose on the right side is smooth, states she is unable to do it on her left side.  Her heel-to-shin smooth on the right side, unable to do on the left side.  Fine motor movements are  smooth on the right side, she makes no attempt on the left side. The patient's motor shows a right upper and lower extremity 5/5.  The left upper and lower extremity has normal tone and normal bulk but she makes no attempts to move her extremities.  It should be noted that when I hold her left arm up, she actually helps to move it up with purposeful and spontaneous movement.  When I told her arm above her head and let go, the arm falls to her belly and not her forehead.  When I pulled both hands underneath each heel and asked the patient to move her left leg, she shows no pressure on her right heel.  When I asked her to lift her right leg, she put significant pressure down on her left heel. PULMONARY:  Clear to  auscultation. CARDIOVASCULAR:  S1, S2 is audible. NECK;  Negative for bruits. Sensation, as stated the patient splits midline at the forehead, bridge of nose, the chin and the chest to pinprick, light touch with decreased sensation stated to be on the left side.  She states her left arm and left leg are decreased compared to her right.  LABORATORY DATA:  TSH is 2.3.  Sodium 133, potassium 3.9, chloride 99, CO2 of 21, glucose 182, BUN 14, creatinine 0.53.  White blood cell count is 10.2, hemoglobin 11.2, hematocrit 36.0, platelets 215.  CT head showed negative for any mass, bleed or acute infarct.  ASSESSMENT/PLAN:  This is a 35 year old female with past medical history of migraine headaches and left hemiplegia.  In addition she also has a past medical history of conversion disorder, diabetes, anxiety, attention deficit disorder, and depression.  The patient presents to the hospital with a 24-hour history of headache with left-sided weakness.  Features on exam suggest non-functional component; however, cannot rule out some underlying authentic component.  At this time, Depakote IV 1000 mg over 15 minutes has been tried with little results.  Would recommend trying asteroid taper, IV bolus 125 mg Solu-Medrol followed by 60 mg daily for 5 days, then decrease by 10 mg every other day until tapered off.  Would recommend PT and OT while she is in the hospital.  No further followup is anticipated.  Dr. Thad Ranger has seen, evaluated and agrees with the above-mentioned.     Felicie Morn, PA-C   ______________________________ Dr. Thad Ranger    DS/MEDQ  D:  09/02/2010  T:  09/02/2010  Job:  [id number]  Electronically Signed by Felicie Morn PA-C on 09/03/2010 12:55:36 PM Electronically Signed by Thana Farr MD on 09/24/2010 12:04:13 PM

## 2010-10-15 ENCOUNTER — Emergency Department (HOSPITAL_COMMUNITY)
Admission: EM | Admit: 2010-10-15 | Discharge: 2010-10-15 | Disposition: A | Discharge status: Home / Self Care | Payer: BC Managed Care – PPO | Attending: Emergency Medicine | Admitting: Emergency Medicine

## 2010-10-15 ENCOUNTER — Emergency Department (HOSPITAL_COMMUNITY): Payer: BC Managed Care – PPO

## 2010-10-15 DIAGNOSIS — Z79899 Other long term (current) drug therapy: Secondary | ICD-10-CM | POA: Insufficient documentation

## 2010-10-15 DIAGNOSIS — IMO0002 Reserved for concepts with insufficient information to code with codable children: Secondary | ICD-10-CM | POA: Insufficient documentation

## 2010-10-15 DIAGNOSIS — E119 Type 2 diabetes mellitus without complications: Secondary | ICD-10-CM | POA: Insufficient documentation

## 2010-10-15 DIAGNOSIS — Z794 Long term (current) use of insulin: Secondary | ICD-10-CM | POA: Insufficient documentation

## 2010-10-15 DIAGNOSIS — F341 Dysthymic disorder: Secondary | ICD-10-CM | POA: Insufficient documentation

## 2010-10-15 DIAGNOSIS — M549 Dorsalgia, unspecified: Secondary | ICD-10-CM | POA: Insufficient documentation

## 2010-10-15 DIAGNOSIS — R209 Unspecified disturbances of skin sensation: Secondary | ICD-10-CM | POA: Insufficient documentation

## 2010-10-15 LAB — URINALYSIS, ROUTINE W REFLEX MICROSCOPIC
Bilirubin Urine: NEGATIVE
Ketones, ur: NEGATIVE mg/dL
Leukocytes, UA: NEGATIVE
Nitrite: NEGATIVE
Protein, ur: NEGATIVE mg/dL

## 2010-10-15 LAB — POCT PREGNANCY, URINE: Preg Test, Ur: NEGATIVE

## 2010-10-22 ENCOUNTER — Emergency Department (HOSPITAL_COMMUNITY)
Admission: EM | Admit: 2010-10-22 | Discharge: 2010-10-22 | Disposition: A | Discharge status: Home / Self Care | Payer: BC Managed Care – PPO | Attending: Emergency Medicine | Admitting: Emergency Medicine

## 2010-10-22 DIAGNOSIS — F988 Other specified behavioral and emotional disorders with onset usually occurring in childhood and adolescence: Secondary | ICD-10-CM | POA: Insufficient documentation

## 2010-10-22 DIAGNOSIS — F341 Dysthymic disorder: Secondary | ICD-10-CM | POA: Insufficient documentation

## 2010-10-22 DIAGNOSIS — E119 Type 2 diabetes mellitus without complications: Secondary | ICD-10-CM | POA: Insufficient documentation

## 2010-10-22 DIAGNOSIS — Z794 Long term (current) use of insulin: Secondary | ICD-10-CM | POA: Insufficient documentation

## 2010-10-22 DIAGNOSIS — G43909 Migraine, unspecified, not intractable, without status migrainosus: Secondary | ICD-10-CM | POA: Insufficient documentation

## 2010-10-22 DIAGNOSIS — M549 Dorsalgia, unspecified: Secondary | ICD-10-CM | POA: Insufficient documentation

## 2010-11-01 ENCOUNTER — Inpatient Hospital Stay (HOSPITAL_COMMUNITY)
Admission: EM | Admit: 2010-11-01 | Discharge: 2010-11-06 | DRG: 024 | Disposition: A | Discharge status: Home / Self Care | Payer: BC Managed Care – PPO | Attending: Internal Medicine | Admitting: Internal Medicine

## 2010-11-01 DIAGNOSIS — R1310 Dysphagia, unspecified: Secondary | ICD-10-CM | POA: Diagnosis present

## 2010-11-01 DIAGNOSIS — G43409 Hemiplegic migraine, not intractable, without status migrainosus: Principal | ICD-10-CM | POA: Diagnosis present

## 2010-11-01 DIAGNOSIS — F341 Dysthymic disorder: Secondary | ICD-10-CM | POA: Diagnosis present

## 2010-11-01 DIAGNOSIS — K219 Gastro-esophageal reflux disease without esophagitis: Secondary | ICD-10-CM | POA: Diagnosis present

## 2010-11-01 DIAGNOSIS — Z794 Long term (current) use of insulin: Secondary | ICD-10-CM

## 2010-11-01 DIAGNOSIS — G4733 Obstructive sleep apnea (adult) (pediatric): Secondary | ICD-10-CM | POA: Diagnosis present

## 2010-11-01 DIAGNOSIS — D649 Anemia, unspecified: Secondary | ICD-10-CM | POA: Diagnosis present

## 2010-11-01 DIAGNOSIS — E119 Type 2 diabetes mellitus without complications: Secondary | ICD-10-CM | POA: Diagnosis present

## 2010-11-01 DIAGNOSIS — E871 Hypo-osmolality and hyponatremia: Secondary | ICD-10-CM | POA: Diagnosis present

## 2010-11-01 LAB — BASIC METABOLIC PANEL
Calcium: 9.7 mg/dL (ref 8.4–10.5)
Chloride: 97 mEq/L (ref 96–112)
Creatinine, Ser: 0.6 mg/dL (ref 0.50–1.10)
GFR calc Af Amer: >60 mL/min (ref >60)
GFR calc non Af Amer: >60 mL/min (ref >60)

## 2010-11-01 LAB — CBC
MCHC: 33 g/dL (ref 30.0–36.0)
MCV: 79.3 fL (ref 78.0–100.0)
Platelets: 225 10*3/uL (ref 150–400)
RDW: 14 % (ref 11.5–15.5)
WBC: 9.2 10*3/uL (ref 4.0–10.5)

## 2010-11-01 LAB — DIFFERENTIAL
Eosinophils Absolute: 0.1 10*3/uL (ref 0.0–0.7)
Eosinophils Relative: 2 % (ref 0–5)
Lymphs Abs: 3 10*3/uL (ref 0.7–4.0)
Monocytes Absolute: 0.4 10*3/uL (ref 0.1–1.0)

## 2010-11-01 LAB — POCT PREGNANCY, URINE: Preg Test, Ur: NEGATIVE

## 2010-11-02 LAB — DIFFERENTIAL
Basophils Absolute: 0 10*3/uL (ref 0.0–0.1)
Basophils Relative: 0 % (ref 0–1)
Lymphocytes Relative: 36 % (ref 12–46)
Monocytes Relative: 5 % (ref 3–12)
Neutro Abs: 5.1 10*3/uL (ref 1.7–7.7)
Neutrophils Relative %: 57 % (ref 43–77)

## 2010-11-02 LAB — CBC
HCT: 35.3 % — ABNORMAL LOW (ref 36.0–46.0)
Hemoglobin: 11.5 g/dL — ABNORMAL LOW (ref 12.0–15.0)
MCH: 26.1 pg (ref 26.0–34.0)
RBC: 4.4 MIL/uL (ref 3.87–5.11)

## 2010-11-02 LAB — BASIC METABOLIC PANEL
BUN: 14 mg/dL (ref 6–23)
CO2: 24 mEq/L (ref 19–32)
Calcium: 8.8 mg/dL (ref 8.4–10.5)
Creatinine, Ser: 0.49 mg/dL — ABNORMAL LOW (ref 0.50–1.10)
GFR calc non Af Amer: >60 mL/min (ref >60)
Glucose, Bld: 140 mg/dL — ABNORMAL HIGH (ref 70–99)
Sodium: 136 mEq/L (ref 135–145)

## 2010-11-02 LAB — GLUCOSE, CAPILLARY
Glucose-Capillary: 141 mg/dL — ABNORMAL HIGH (ref 70–99)
Glucose-Capillary: 130 mg/dL — ABNORMAL HIGH (ref 70–99)
Glucose-Capillary: 144 mg/dL — ABNORMAL HIGH (ref 70–99)

## 2010-11-03 LAB — CBC
MCHC: 32 g/dL (ref 30.0–36.0)
Platelets: 229 10*3/uL (ref 150–400)
RDW: 14.2 % (ref 11.5–15.5)

## 2010-11-03 LAB — BASIC METABOLIC PANEL
GFR calc Af Amer: >60 mL/min (ref >60)
GFR calc non Af Amer: >60 mL/min (ref >60)
Potassium: 3.9 mEq/L (ref 3.5–5.1)
Sodium: 132 mEq/L — ABNORMAL LOW (ref 135–145)

## 2010-11-03 LAB — GLUCOSE, CAPILLARY
Glucose-Capillary: 156 mg/dL — ABNORMAL HIGH (ref 70–99)
Glucose-Capillary: 151 mg/dL — ABNORMAL HIGH (ref 70–99)

## 2010-11-04 ENCOUNTER — Ambulatory Visit: Payer: BC Managed Care – PPO | Admitting: Physical Therapy

## 2010-11-04 LAB — BASIC METABOLIC PANEL
CO2: 25 mEq/L (ref 19–32)
Calcium: 8.9 mg/dL (ref 8.4–10.5)
Creatinine, Ser: 0.53 mg/dL (ref 0.50–1.10)

## 2010-11-04 LAB — CBC
MCH: 26.2 pg (ref 26.0–34.0)
MCHC: 32.6 g/dL (ref 30.0–36.0)
MCV: 80.4 fL (ref 78.0–100.0)
Platelets: 212 10*3/uL (ref 150–400)
RBC: 4.39 MIL/uL (ref 3.87–5.11)
RDW: 14.1 % (ref 11.5–15.5)

## 2010-11-04 LAB — GLUCOSE, CAPILLARY
Glucose-Capillary: 173 mg/dL — ABNORMAL HIGH (ref 70–99)
Glucose-Capillary: 141 mg/dL — ABNORMAL HIGH (ref 70–99)

## 2010-11-05 LAB — GLUCOSE, CAPILLARY
Glucose-Capillary: 162 mg/dL — ABNORMAL HIGH (ref 70–99)
Glucose-Capillary: 153 mg/dL — ABNORMAL HIGH (ref 70–99)
Glucose-Capillary: 112 mg/dL — ABNORMAL HIGH (ref 70–99)

## 2010-11-06 LAB — GLUCOSE, CAPILLARY

## 2010-11-07 LAB — GLUCOSE, CAPILLARY: Glucose-Capillary: 174 mg/dL — ABNORMAL HIGH (ref 70–99)

## 2010-11-18 NOTE — H&P (Signed)
[name], [name] NO.:  [id number]  MEDICAL RECORD NO.:  [id number]  LOCATION:  1510                         FACILITY:  [facility]  PHYSICIAN:  Gardiner Barefoot, MD    DATE OF BIRTH:  1975-03-29  DATE OF ADMISSION:  11/01/2010 DATE OF DISCHARGE:                             HISTORY & PHYSICAL   CHIEF COMPLAINT:  Headache.  HISTORY OF PRESENT ILLNESS:  This is a 35 year old female with a history of multiple evaluations and admissions for headache and hemiplegia and a complete workup who comes in with complaint of the same.  History from the patient is difficult to obtain, as she does not give more than one- word answers due to the difficulty with her speech.  By questioning her, I have determined that she had a headache that started today and developed left-sided weakness and left-sided type headache.  She agrees this is typical for her usual episodes.  In looking the record from the past, it does appear that she does have the same symptoms as before. She has had extensive workup from Neurology and may have a history of atypical migraines, but also likely conversion disorder.  There is no history of any trauma.  She has been evaluated by both Psychiatry and Neurology in the past and no organic cause of her complaints have been determined.  They do typically though resolve without any significant intervention.  PAST MEDICAL HISTORY: 1. Diabetes. 2. Anxiety. 3. Attention deficit disorder. 4. GERD. 5. Sleep apnea. 6. Hemiplegic atypical migraine. 7. Conversion disorder.  MEDICATIONS: 1. Adderall 20 mg 3 times a day. 2. Xanax 1 mg 3 times a day. 3. Abilify 5 mg daily. 4. Phenergan 25 mg every 6 hours as needed. 5. Amaryl 4 mg daily. 6. Ambien 10 mg at night. 7. Flexeril 5 mg 3 times a day as needed for headache. 8. Metformin 1 g daily. 9. Humulin insulin 70/30, 15 units twice a day. 10.Pristiq 50 mg XR daily. 11.Thorazine 25 mg tabs 1-2 tablets as  needed for headache. 12.Topamax 25 mg tabs 4 tablets every night. 13.Victoza 1.8 mg subcutaneous daily.  Medications stopped after last hospitalization include aspart due to good blood pressure control.  ALLERGIES: 1. HYDROXYZINE. 2. CEPHALOSPORINS. 3. CODEINE. 4. COMPAZINE. 5. DEMEROL. 6. MORPHINE. 7. PENICILLIN. 8. REGLAN. 9. SULFA. 10.Zofran.  These are all reported to cause anaphylaxis.  SOCIAL HISTORY:  She does have 3 children and is married.  She denies alcohol, tobacco, or drug use to me.  FAMILY HISTORY:  No history of migraines in her family.  REVIEW OF SYSTEMS:  12-point review of systems was obtained from the patient with the acknowledgement of yes or no, and was negative except as per the history of present illness.  PHYSICAL EXAMINATION:  VITAL SIGNS:  Temperature 97.9, heart rate is 121, blood pressure is 124/83, respirations 19, O2 sat is 96% room air. GENERAL:  The patient is awake and alert. CARDIOVASCULAR:  She is tachycardic with regular rhythm and no murmurs, rubs, or gallops. LUNGS:  Clear to auscultation bilaterally. ABDOMEN:  Soft, nontender, nondistended.  Positive bowel sounds and no hepatosplenomegaly. NEURO:  This is difficult to assess with her, but  she does have good strength on the right side.  On the left side, it does appear to be poor effort.  She does have left eyelid droop, though she is able to move her left eyelid during the exam.  She does have poor speech, but it does not appear to be consistent with a slurred speech or difficulty in any tongue movement.  LABORATORY DATA:  Negative pregnancy.  Sodium 137, potassium 4.0, chloride 97, bicarb 23, glucose 232, BUN 16, creatinine 0.6.  WBC is 9.2, platelets 225, and hemoglobin 13.4.  No imaging performed.  ASSESSMENT/PLAN: 1. Atypical migraine versus conversion.  This does appear to be very     consistent with her previous episodes.  She has had a complete     workup in the past  and consultation with Psychiatry and Neurology.     I do not feel that she needs any further evaluation for these     episodes as they do not appear by my exam to be consistent with a     true neurologic deficit, though this can be evaluated further     during her hospitalization by serial exams.  I therefore     did not feel she needs any further imaging and was just be treated     symptomatically and have physical therapy evaluate her again.  She     also will receive her home medications including for her migraine     control. 2. Diabetes.  Her blood sugar is mildly elevated and she will be     placed on a sliding scale insulin.     Gardiner Barefoot, MD     RWC/MEDQ  D:  11/01/2010  T:  11/02/2010  Job:  [id number]  Electronically Signed by Staci Righter MD on 11/18/2010 10:32:02 AM

## 2010-11-25 NOTE — Discharge Summary (Signed)
NAMEMarland Kitchen  [name], [name] NO.:  [id number]  MEDICAL RECORD NO.:  [id number]  LOCATION:  1510                         FACILITY:  [facility]  PHYSICIAN:  Hillery Aldo, M.D.   DATE OF BIRTH:  Nov 11, 1975  DATE OF ADMISSION:  11/01/2010 DATE OF DISCHARGE:  11/06/2010                              DISCHARGE SUMMARY   PRIMARY CARE PHYSICIAN:  [name], MD with [facility] at [facility].  DISCHARGE DIAGNOSES: 1. Hemiplegic migraine versus conversion disorder versus malingering 2. Type 2 diabetes. 3. Subjective dysphagia status post speech therapy evaluation with no     deficits noted. 4. Depression. 5. Hyponatremia, resolved. 6. Mild normocytic anemia. 7. History of attention deficit. 8. Gastroesophageal reflux disease. 9. Obstructive sleep apnea, CPAP q.h.s. 10.Anxiety.  DISCHARGE MEDICATIONS: 1. Oxycodone 5-10 mg p.o. q.6 h. p.r.n. pain (#20 written for). 2. Abilify 5 mg p.o. q.h.s. 3. Alprazolam 1 mg p.o. t.i.d. 4. Ambien 10 mg p.o. q.h.s. p.r.n. sleep. 5. Flexeril 10 mg one-half to 1 tablet p.o. t.i.d. p.r.n. muscle     spasm. 6. Amaryl 4 mg p.o. daily. 7. Metformin XR 1000 mg p.o. daily. 8. Novolin 70/30 15 units subcu b.i.d. 9. Pristiq XR 50 mg p.o. daily. 10.Phenergan syrup 6.25 mg/5 mL, 20 mL p.o. q.i.d. p.r.n. nausea. 11.Thorazine 25 mg 1-2 tablets p.o. q.4 h. p.r.n. for headache not to     exceed 8 tablets in 24 hours. 12.Topamax 125 mg p.o. q.h.s. 13.Ultram 50 mg p.o. q.6 h. p.r.n. pain. 14.Urea cream to heels topically q.h.s. 15.Victoza 1.8 mg subcutaneously daily.  CONSULTATIONS:  None.  BRIEF ADMISSION HISTORY OF PRESENT ILLNESS:  The patient is a 35 year old female who presented to the hospital with a chief complaint of headache and inability to move her left side in the setting of a prior diagnosis of hemiplegic migraines.  The patient has had multiple evaluations and admissions for headache accompanied by hemiplegia  and multiple MRI scans of the brain and despite extensive evaluation, no etiology of her underlying hemiplegia has been forthcoming.  She has been evaluated by both Psychiatry, Neurology with no organic cause of her complaints found.  It was felt that the patient has either hemiplegic migraines or conversion disorder based on past evaluations. The patient was evaluated by the emergency department physicians and ultimately referred to the hospitalist service for further evaluation of her migraine headache and hemiplegia.  For the full details, please see the dictated report done by Dr. Luciana Axe.  PROCEDURES AND DIAGNOSTIC STUDIES:  None.  DISCHARGE LABORATORY VALUES:  Sodium was 135, potassium 4.0, chloride 99, bicarb 25, BUN 10, creatinine 0.53, glucose 182, calcium 8.9.  White blood cell count was 7.8, hemoglobin 11.5, hematocrit 35.3, platelets 212.  Urine pregnancy testing was negative.  HOSPITAL COURSE BY PROBLEM: 1. Hemiplegic migraine versus conversion disorder versus malingering:     The patient was admitted and put on pain control measures and her     usual outpatient regimen including Topamax.  She was seen and     evaluated by Speech Pathology, Physical Therapy, and Occupational     Therapy.  At this point, there have been varying reports of her  ability to move her left side and she has been noted to be able to     ambulate independently when not being directly observed raising the     specter that her symptom complex is more consistent with     malingering.  Nevertheless, the patient was treated conservatively     for several days and on November 05, 2010, a discussion was held with     her regarding the option of nursing home placement for rehab.  She     was reluctant to pursue this option, but was told that she cannot     remain in the hospital indefinitely while waiting her strength     return.  That afternoon, the care manager went to talk with her     about rehab  options and observed her ambulating freely to the     bathroom with complete ability to move her left leg.  When     confronted about this, the patient states that she was still weak     and ambulated because the nursing staff did not come in a timely     manner to assist her and that she almost lost her footing.  The     patient clearly would benefit from outpatient psychiatric follow up     and stress reduction manic measures.  At this point, we will set     her up with home physical and occupational therapy and durable     equipment as recommended by Physical and Occupational Therapy, and     encouraged her to follow up closely with her primary care     physician. 2. Subjective dysphagia:  The patient was seen and evaluated by Speech     Therapy who found no objective deficits. 3. Type 2 diabetes:  The patient is managed with a combination of     metformin, Amaryl, 70/30 insulin, and Victoza.  She will be resumed     on these medications at discharge. 4. Depression:  The patient was maintained on Abilify and Pristiq. 5. Hyponatremia:  The patient's sodium normalized. 6. Mild normocytic anemia:  The patient's hemoglobin and hematocrit    are stable.  Recommend follow up with her primary care physician     for ongoing evaluation if deemed necessary. 7. History of attention deficit:  The patient does not appear to be on     ongoing medications for this, but apparently has used Adderall in     the past. 8. Gastroesophageal reflux disease:  No active symptoms while in the     hospital. 9. Obstructive sleep apnea:  The patient was maintained on nocturnal     CPAP while in the hospital. 10.Anxiety:  The patient was provided with Xanax and maintained on     Pristiq.  DISPOSITION:  The patient is medically stable and will be discharged home with home physical and occupational therapy services and durable equipment as recommended.  CONDITION ON DISCHARGE:  Improved.  DISCHARGE  INSTRUCTIONS:  Activity:  Increase activity slowly, walk with assistance.  DIET:  Low-sodium, heart-healthy, diabetic.  FOLLOWUP:  With [name], MD in 1-2 weeks.  Please call for an appointment time.  Time spent coordinating care for discharge and discharge instructions including face-to-face time equals approximately 35 minutes.     Hillery Aldo, M.D.     CR/MEDQ  D:  11/06/2010  T:  11/06/2010  Job:  [id number]  cc:   [name], MD Fax: [phone]  Electronically  Signed by Hillery Aldo M.D. on 11/25/2010 05:19:23 PM

## 2010-12-19 ENCOUNTER — Inpatient Hospital Stay (HOSPITAL_COMMUNITY)
Admission: EM | Admit: 2010-12-19 | Discharge: 2010-12-23 | DRG: 025 | Disposition: A | Discharge status: Home / Self Care | Payer: BC Managed Care – PPO | Attending: Internal Medicine | Admitting: Internal Medicine

## 2010-12-19 ENCOUNTER — Emergency Department (HOSPITAL_COMMUNITY): Payer: BC Managed Care – PPO

## 2010-12-19 DIAGNOSIS — F449 Dissociative and conversion disorder, unspecified: Secondary | ICD-10-CM | POA: Diagnosis present

## 2010-12-19 DIAGNOSIS — G4730 Sleep apnea, unspecified: Secondary | ICD-10-CM | POA: Diagnosis present

## 2010-12-19 DIAGNOSIS — Z794 Long term (current) use of insulin: Secondary | ICD-10-CM

## 2010-12-19 DIAGNOSIS — Z765 Malingerer [conscious simulation]: Secondary | ICD-10-CM

## 2010-12-19 DIAGNOSIS — F988 Other specified behavioral and emotional disorders with onset usually occurring in childhood and adolescence: Secondary | ICD-10-CM | POA: Diagnosis present

## 2010-12-19 DIAGNOSIS — G43409 Hemiplegic migraine, not intractable, without status migrainosus: Principal | ICD-10-CM | POA: Diagnosis present

## 2010-12-19 DIAGNOSIS — K219 Gastro-esophageal reflux disease without esophagitis: Secondary | ICD-10-CM | POA: Diagnosis present

## 2010-12-19 DIAGNOSIS — E119 Type 2 diabetes mellitus without complications: Secondary | ICD-10-CM | POA: Diagnosis present

## 2010-12-19 LAB — DIFFERENTIAL
Eosinophils Relative: 2 % (ref 0–5)
Lymphocytes Relative: 38 % (ref 12–46)
Lymphs Abs: 3.7 10*3/uL (ref 0.7–4.0)
Monocytes Absolute: 0.5 10*3/uL (ref 0.1–1.0)

## 2010-12-19 LAB — POCT I-STAT, CHEM 8
Calcium, Ion: 1.13 mmol/L (ref 1.12–1.32)
Glucose, Bld: 83 mg/dL (ref 70–99)
HCT: 42 % (ref 36.0–46.0)
Hemoglobin: 14.3 g/dL (ref 12.0–15.0)
TCO2: 24 mmol/L (ref 0–100)

## 2010-12-19 LAB — CBC
HCT: 40.3 % (ref 36.0–46.0)
MCH: 26.2 pg (ref 26.0–34.0)
MCHC: 32.3 g/dL (ref 30.0–36.0)
MCV: 81.3 fL (ref 78.0–100.0)
RDW: 14.2 % (ref 11.5–15.5)

## 2010-12-20 LAB — COMPREHENSIVE METABOLIC PANEL
AST: 16 U/L (ref 0–37)
CO2: 26 mEq/L (ref 19–32)
Calcium: 9 mg/dL (ref 8.4–10.5)
Creatinine, Ser: 0.52 mg/dL (ref 0.50–1.10)
GFR calc Af Amer: >60 mL/min (ref >60)
GFR calc non Af Amer: >60 mL/min (ref >60)

## 2010-12-20 LAB — GLUCOSE, CAPILLARY
Glucose-Capillary: 138 mg/dL — ABNORMAL HIGH (ref 70–99)
Glucose-Capillary: 153 mg/dL — ABNORMAL HIGH (ref 70–99)

## 2010-12-20 LAB — CBC
MCH: 25.8 pg — ABNORMAL LOW (ref 26.0–34.0)
MCV: 82.4 fL (ref 78.0–100.0)
Platelets: 250 10*3/uL (ref 150–400)
RBC: 4.76 MIL/uL (ref 3.87–5.11)
RDW: 14 % (ref 11.5–15.5)

## 2010-12-20 LAB — TSH: TSH: 0.712 u[IU]/mL (ref 0.350–4.500)

## 2010-12-21 DIAGNOSIS — F339 Major depressive disorder, recurrent, unspecified: Secondary | ICD-10-CM

## 2010-12-21 DIAGNOSIS — F411 Generalized anxiety disorder: Secondary | ICD-10-CM

## 2010-12-21 LAB — GLUCOSE, CAPILLARY
Glucose-Capillary: 93 mg/dL (ref 70–99)
Glucose-Capillary: 104 mg/dL — ABNORMAL HIGH (ref 70–99)
Glucose-Capillary: 117 mg/dL — ABNORMAL HIGH (ref 70–99)

## 2010-12-22 LAB — GLUCOSE, CAPILLARY: Glucose-Capillary: 179 mg/dL — ABNORMAL HIGH (ref 70–99)

## 2010-12-23 DIAGNOSIS — G839 Paralytic syndrome, unspecified: Secondary | ICD-10-CM

## 2010-12-23 DIAGNOSIS — F449 Dissociative and conversion disorder, unspecified: Secondary | ICD-10-CM

## 2011-01-04 NOTE — Consult Note (Signed)
NAMEALIE, MOUDY NO.:  [id number]  MEDICAL RECORD NO.:  [id number]  LOCATION:  1513                         FACILITY:  [facility]  PHYSICIAN:  Conni Slipper, MDDATE OF BIRTH:  01/26/1976  DATE OF CONSULTATION:  12/20/2010 DATE OF DISCHARGE:                                CONSULTATION   HISTORY:  [name] is a 35 year old married Caucasian female who was admitted to the [facility] Floor for the chief complains of headache with left-sided weakness.  Psychiatry consult was requested this patient for possible conversion disorder versus malingering.  The patient reported that she has been suffering with multiple medical problems and psychiatric conditions.  The patient has been suffering with diabetes mellitus, gastroesophageal reflux disease, sleep apnea, and uncontrollable migraine headaches with recurrent hemiplegia.  The patient also reported that she has migraine headaches for the last 3years and has been having recurrent left-sided weakness every 6 weeks for the last 2-3 years.  The patient reported that her weakness will be usually goes away sometimes without any intervention, sometimes need to be hospitalized.  The patient reported that she has been diagnosed with attention deficit disorder during the senior year of the high school and has been taking amphetamines like Adderall, now she takes as needed. The patient reported she has been depressed since 1996.  When she has relationship with the boyfriend at that time.  This patient reported she continued to be depressed recently because of financial problems and possible bankruptcy due to my health related bills.  The patient reported that she has been suffering with anxiety attacks for the last 1 year.  The patient reported she has been receiving outpatient psychiatric services from [facility], [facility] for the last 1 year.  The patient was treated with Pristiq 50  mg and alprazolam 1 mg 4 times a day, Abilify 5 mg daily and she also takes Adderall for ADD as needed.  The patient denied any past psychiatric admissions.  PAST MEDICAL HISTORY:  As per the history and physical.  SOCIAL HISTORY:  The patient reportedly moved around several states. She was originally from [place], [place], then moved to the [place], [place], [place] and [place], [place] before coming to [place] about a year ago.  Her husband works as a Transport planner in [employer], which is Special educational needs teacher.  The patient has 3 children, 33, 86, and 54 years old girls. The patient reported girls were in day care and 34 years old will go to the school.  Her husband is supportive of her and children.  FAMILY HISTORY:  Significant for migraine headaches in her biological mother.  The patient reported no family history of psychiatric illness. The patient has no history of substance abuse or drug abuse.  MENTAL STATUS EXAM:  The patient appeared as per her stated age, obese, lying down her head on bed, watching television.  Head end was elevated. The patient was calm, quite, cooperative.  She has stated mood of frustrated.  Her affect was flat.  She has difficulty with articulation, but is able to repeat for herself when asked.  75% to 85% of her words were understandable.  The patient has  little to no movement on her left extremities.  The patient is hopeful that her hemiplegia will wean off usually within 2 or 3 days.  The patient has denied any suicidal or homicidal ideations, intentions, and plan.  She has no evidence of hallucinations or responding to internal stimuli.  The patient has a fair insight, judgment, and impulse control.  ASSESSMENT:  Major depressive disorder recurrent by history, anxiety disorder not otherwise specified, attention deficit disorder, migraine headache with left hemiparesis and the patient has morbid obesity and sleep apnea.  The patient has a  financial difficulties due to medical bills.  The patient does not seem to be suffering with malingering or conversion disorder at this time.  RECOMMENDATIONS:  The patient will be receiving her medications, Pristiq, alprazolam, Abilify as prescribed and the patient will be followed up with Dr. Milagros Evener, upon discharge.  The patient does not meet her criteria for acute psychiatric hospitalization.  I appreciate this consult on Ms. [name].  Please contact the psychiatric consult services as needed.     Conni Slipper, MD     JRJ/MEDQ  D:  12/21/2010  T:  12/21/2010  Job:  [id number]  Electronically Signed by Leata Mouse MD on 01/04/2011 11:59:53 AM

## 2011-01-07 NOTE — H&P (Signed)
NAMESHAKIA, SEBASTIANO NO.:  [id number]  MEDICAL RECORD NO.:  [id number]  LOCATION:  WLED                         FACILITY:  [facility]  PHYSICIAN:  Zadyn Yardley Bosie Helper, MD      DATE OF BIRTH:  [birth year]  DATE OF ADMISSION:  12/19/2010 DATE OF DISCHARGE:                             HISTORY & PHYSICAL   PRIMARY CARE PHYSICIAN:  [name], MD with [facility].  CHIEF COMPLAINT:  Headache with left side weakness.  HISTORY OF PRESENT ILLNESS:  This is a 35 year old female with history of multiple evaluation and admission for headache and hemiplegia, and a complete workup done before in the past and there is no neuro deficit found.  History from the patient is very difficult to obtain as she is dysarthric right now and difficulty finding words and has difficulty with speech.  By questioning, she admitted she had severe headache, mainly at her right frontal area, 10/10, associated with nausea and vomiting, and that started 3 days ago and she woke up today with really worsening headache and then developed left side weakness and left side type headache.  Looking over the record, the patient evaluated extensively by the Neurology and has a history of atypical migraine and previously on August 16th evaluation, likely conversion disorder versus malingering.  There is no history of any trauma.  She was previously evaluated by both Psychiatric and Neurology in the past.  No organic cause of her complaint has been determined.  PAST MEDICAL HISTORY: 1. Diabetes mellitus. 2. Anxiety. 3. Attention deficit disorder. 4. Gastroesophageal reflux disease. 5. Sleep apnea, on CPAP. 6. Hemiplegia, atypical migraine conversion disorder.  MEDICATIONS: 1. Topamax 150 mg p.o. at bedtime. 2. Promethazine 20 mL 4 times daily as needed. 3. Pristiq 50 mg p.o. daily at bedtime. 4. Glumetza XR 1000 mg 1 tablet daily. 5. Flexeril 10 mg 3 times daily as  needed. 6. Alprazolam 1 mg 4 times daily. 7. Abilify 5 mg p.o. daily at bedtime. 8. Insulin Lantus 30 units subcutaneously at bedtime. 9. Thorazine 25 mg 1-2 tablets q.4 h. as needed. 10.Victoza 1.8 mg injection daily.  ALLERGIES: 1. CEPHALOSPORIN. 2. CODEINE. 3. COMPAZINE. 4. DEMEROL. 5. MORPHINE. 6. PENICILLIN. 7. REGLAN. 8. SULFA. 9. ZOFRAN.  SOCIAL HISTORY:  She has 3 children and she is married.  She is not working.  Denies any alcohol, tobacco, or drug abuse.  FAMILY HISTORY:  No previous history of migraine headache in her family.  REVIEW OF SYSTEMS:  The patient complained of blurring of vision and right side headache, 10/10, associated with neck stiffness.  No blurring of vision.  She cannot feel her left side and cannot able to move her left side.  She also noticed dysarthria and difficult articulation. Denies any facial drooping.  Denies any saliva drooping.  Denies any chest pain or shortness of breath.  Denies any abdominal pain, but complained of nausea and vomiting which stopped after she came to the hospital. PHYSICAL EXAMINATION:  VITAL SIGNS:  Temperature 98.5, blood pressure 128/84, pulse rate 95, respiratory rate 16, saturating 100% on room air. GENERAL:  The patient lying comfortably in bed, not in respiratory distress  or shortness of breath.  There is some dysarthria, but not typical.  She did show some left facial weakness.  Loss of sensation on the left side to touch and painful stimuli. HEART:  S1 and S2 with no added sounds. LUNGS:  Normal vesicular breathing with equal air entry. ABDOMEN:  Soft, nontender, nondistended, positive bowel sounds.  No hepatomegaly. NEUROLOGIC:  Difficult to assess, but she does have a good strength on her right side.  On the left side, it does appear to have poor effort. There is not even flicking of her toes or her fingers.  She does have poor effort even to assess her cranial nerve.  She has dysarthria, but it does  not appear to be typical and consistent with slurred speech or difficulty in any tongue movement.  Blood workup did show white blood cells 9.9, hemoglobin 13, hematocrit 40.3, platelets of 239.  Sodium 138, potassium 3.7, chloride 102, glucose 83, BUN 10, and creatinine 0.7.  CT head without contrast, normal CT of the head without contrast.  ASSESSMENT AND PLAN:  This is a 35 year old with multiple admissions for the same, complained of migraine headache associated with left lower extremity weakness, differential atypical migraine versus convulsion. This appear to be typical with her previous presentation.  She had a complete workup in the past and consultation with Psychiatry and Neurology.  I do not see that she needs any further evaluation for this episode as it does not appear to my exam consistent with true neurological deficit, so this can be evaluated further during hospitalization by serial exam.  I, therefore, did not feel she need any further imaging and was just be treated symptomatically and we will involve Physical Therapy and Speech Therapy.  We will start the patient on her medication Dilaudid and Phenergan.  Diabetes mellitus.  Blood sugar is mildly elevated and she will be placed on sliding scale. Insulin Lantus 30 units subcutaneously.  We will resume home medications carefully.     Brodrick Curran Bosie Helper, MD     HIE/MEDQ  D:  12/19/2010  T:  12/19/2010  Job:  [id number]  Electronically Signed by Ebony Cargo MD on 01/07/2011 02:35:59 PM

## 2011-01-07 NOTE — Discharge Summary (Signed)
  [name], [name]               ACCOUNT NO.:  [id number]  MEDICAL RECORD NO.:  [id number]  LOCATION:  1513                         FACILITY:  [facility]  PHYSICIAN:  Aryonna Gunnerson I Devesh Monforte, MD      DATE OF BIRTH:  [birth year]  DATE OF ADMISSION:  12/19/2010 DATE OF DISCHARGE:  12/23/2010                              DISCHARGE SUMMARY   DISCHARGE DIAGNOSES: 1. Migraine headache with left side weakness likely secondary to     conversion disorder, malingering. 2. On chronic pain medications. 3. History of insulin-dependent diabetes mellitus. 4. Gastroesophageal reflux disease. 5. Sleep apnea. 6. History of attention deficit disorder. 7. On CPAP.  DISCHARGE MEDICATIONS: 1. Abilify 5 mg at bedtime. 2. Alprazolam 1 mg 4 times daily. 3. Flexeril 10 mg 3 times daily as needed. 4. Metformin 1000 mg daily. 5. Lantus 30 units subcu at bedtime. 6. NovoLog sliding scale 3 units at breakfast, 6 units at lunch, and     10 units at dinner. 7. Pristiq 50 mg daily. 8. Phenergan 20 mL by mouth 4 times daily. 9. Thorazine 25 mg 1 to 2 tablets every 4 hours as needed. 10.Topamax 100 mg daily, 150 mg p.o. at bedtime. 11.Victoza 1.8 injection subcu daily.  CONSULTATIONS:  I consulted and inpatient rehab consulted.  This is a 35 year old female admitted to the hospital with chief complaint of headache and inability to move her left side in the setting of prior diagnosis of hemiplegic syndrome.  Has multiple evaluations, admissions for headache accompanied by hemiplegia in the past and multiple neuro consultations and was no organic cause of her complaint found.  Also, the patient has hemiplegic migraine or conversion disorder based on the biopsy evaluation.  The patient was evaluated by the emergency department physician and ultimately referred to the hospital service for further evaluation.  Hemiplegic migraine, I think likely secondary to conversion disorder.  Many time the patient able to move her  arm.  Psych consulted.  Nevertheless, the patient was treated conservative for several days including Speech Therapy and Physical Therapy and Occupational Therapy and has an inpatient rehab where they declined patient for admission.  They recommended to discharge the patient to home.  The patient on examination has no effort to move her lower extremity.  I did come suddenly to the room and I found the patient moving her upper and lower extremity even seating herself.  Currently, I feel the patient is medically stable for discharge, need to follow up with her MD, patient is not a candidate for [facility] for behavior health admission.     [name], MD     HIE/MEDQ  D:  12/23/2010  T:  12/23/2010  Job:  [id number]  Electronically Signed by Ebony Cargo MD on 01/07/2011 02:35:50 PM

## 2011-01-27 ENCOUNTER — Encounter (HOSPITAL_COMMUNITY): Payer: Self-pay

## 2011-01-27 ENCOUNTER — Emergency Department (HOSPITAL_COMMUNITY)
Admission: EM | Admit: 2011-01-27 | Discharge: 2011-01-28 | Disposition: A | Discharge status: Home / Self Care | Payer: BC Managed Care – PPO | Attending: Emergency Medicine | Admitting: Emergency Medicine

## 2011-01-27 DIAGNOSIS — R51 Headache: Secondary | ICD-10-CM | POA: Insufficient documentation

## 2011-01-27 DIAGNOSIS — F449 Dissociative and conversion disorder, unspecified: Secondary | ICD-10-CM

## 2011-01-27 DIAGNOSIS — Z794 Long term (current) use of insulin: Secondary | ICD-10-CM | POA: Insufficient documentation

## 2011-01-27 DIAGNOSIS — Z79899 Other long term (current) drug therapy: Secondary | ICD-10-CM | POA: Insufficient documentation

## 2011-01-27 DIAGNOSIS — E119 Type 2 diabetes mellitus without complications: Secondary | ICD-10-CM | POA: Insufficient documentation

## 2011-01-27 HISTORY — DX: Anxiety disorder, unspecified: F41.9

## 2011-01-27 HISTORY — DX: Major depressive disorder, single episode, unspecified: F32.9

## 2011-01-27 HISTORY — DX: Depression, unspecified: F32.A

## 2011-01-27 LAB — BASIC METABOLIC PANEL
BUN: 8 mg/dL (ref 6–23)
Creatinine, Ser: 0.59 mg/dL (ref 0.50–1.10)
GFR calc Af Amer: >90 mL/min (ref >90)
GFR calc non Af Amer: >90 mL/min (ref >90)
Glucose, Bld: 102 mg/dL — ABNORMAL HIGH (ref 70–99)
Potassium: 3.7 mEq/L (ref 3.5–5.1)

## 2011-01-27 LAB — CBC
HCT: 42.4 % (ref 36.0–46.0)
Hemoglobin: 13.8 g/dL (ref 12.0–15.0)
MCHC: 32.5 g/dL (ref 30.0–36.0)
MCV: 81.7 fL (ref 78.0–100.0)
RDW: 13.6 % (ref 11.5–15.5)

## 2011-01-27 MED ORDER — PROMETHAZINE HCL 25 MG/ML IJ SOLN
25.0000 mg | Freq: Once | INTRAMUSCULAR | Status: DC
  Filled 2011-01-27: qty 1

## 2011-01-27 MED ORDER — SODIUM CHLORIDE 0.9 % IV BOLUS (SEPSIS): 1000.0000 mL | Freq: Once | INTRAVENOUS | Status: DC

## 2011-01-27 MED ORDER — HYDROMORPHONE HCL PF 1 MG/ML IJ SOLN: 1.0000 mg | Freq: Once | INTRAMUSCULAR | Status: DC

## 2011-01-27 NOTE — ED Notes (Signed)
Pt has slurred speech since 3pm,

## 2011-01-27 NOTE — ED Notes (Signed)
Pt complains of a migraine, neck pain and left sided weakness since 2pm today

## 2011-01-28 ENCOUNTER — Encounter (HOSPITAL_COMMUNITY): Payer: Self-pay | Admitting: Emergency Medicine

## 2011-01-28 MED ORDER — HYDROMORPHONE HCL PF 2 MG/ML IJ SOLN
2.0000 mg | Freq: Once | INTRAMUSCULAR | Status: AC
  Administered 2011-01-28: 2 mg via INTRAMUSCULAR
  Filled 2011-01-28: qty 1

## 2011-01-28 MED ORDER — PROMETHAZINE HCL 25 MG/ML IJ SOLN
25.0000 mg | Freq: Once | INTRAMUSCULAR | Status: AC
  Administered 2011-01-28: 25 mg via INTRAMUSCULAR
  Filled 2011-01-28 (×2): qty 1

## 2011-01-28 NOTE — ED Notes (Signed)
Pt states that she is in a lot of pain, and normally she has to be in and out cath for urine samples.

## 2011-01-28 NOTE — ED Notes (Signed)
Pt to ED with c/o migraine h/a since 1430 yesterday. Pt states when she gets migraines she gets slurred speech, and pain down the right side the neck. Pt states MD saw her in triage and ordered blood work in triage. Pt states has not received any meds while waiting. MD to bedside

## 2011-01-28 NOTE — ED Notes (Signed)
Pt provided with chicken broth 

## 2011-01-28 NOTE — ED Notes (Signed)
Pt given discharge instructions and verbalizes understanding  

## 2011-01-28 NOTE — ED Provider Notes (Addendum)
History     CSN: [id number] Arrival date & time: 01/27/2011  8:25 PM   First MD Initiated Contact with Patient 01/27/11 2039      Chief Complaint  Patient presents with  . Headache     Patient is a 35 y.o. female presenting with headaches. The history is provided by the patient and the spouse.  Headache   Pt and her husband who called me on phone reports pt has hx of "hemipalegic migraines" with similar symptoms in past. Review of past charts also shows hx of conversion disorder, malingering, depression, ADD, chronic pain medications use, DM, GERD, and sleep apnea.  Pt reports having HA with left sided weakness and speech changes around 3pm similar to prior episodes.  Symptoms came on gradually.  She has had full work up for her symptoms in past including head CT and MRI.  She has psychiatric consults as well for her dx.  Pt does reports some pain down rt side that is new but no weakness.  She uses rt hand to write and communicate and occasionally speaks.  Pt states symptoms are improved with dilaudid and phenergan.  No aggravating factors.  She denies seizure activity.  No chest pain, fever, chills, cough, N/V.  No increased stress at home.  Pt has 3 daughters and husband.  She denies new depression, SI/HI.   Past Medical History  Diagnosis Date  . Depression   . Anxiety   . Diabetes mellitus   . GERD (gastroesophageal reflux disease)   . Migraine     History reviewed. No pertinent past surgical history.  History reviewed. No pertinent family history.  History  Substance Use Topics  . Smoking status: Never Smoker   . Smokeless tobacco: Not on file  . Alcohol Use: Yes    OB History    Grav Para Term Preterm Abortions TAB SAB Ect Mult Living                  Review of Systems  Neurological: Positive for headaches.    Allergies  Atarax; Cephalosporins; Codeine; Compazine; Decadron; Demerol; Morphine and related; Penicillins; Reglan; Sulfa antibiotics; and  Zofran  Home Medications   Current Outpatient Rx  Name Route Sig Dispense Refill  . ALPRAZOLAM 1 MG PO TABS Oral Take 1 mg by mouth at bedtime as needed.      . AMPHETAMINE-DEXTROAMPHETAMINE 30 MG PO TABS Oral Take 30 mg by mouth daily.      . ARIPIPRAZOLE 5 MG PO TABS Oral Take 5 mg by mouth daily.      . CHLORPROMAZINE HCL 25 MG PO TABS Oral Take 25 mg by mouth 3 (three) times daily.      . CYCLOBENZAPRINE HCL 10 MG PO TABS Oral Take 10 mg by mouth 3 (three) times daily as needed.      . DESVENLAFAXINE SUCCINATE 50 MG PO TB24 Oral Take 50 mg by mouth daily.      Marland Kitchen ESZOPICLONE 2 MG PO TABS Oral Take 2 mg by mouth at bedtime. Take immediately before bedtime     . FENOFIBRATE MICRONIZED 200 MG PO CAPS Oral Take 200 mg by mouth daily before breakfast.      . INSULIN ASPART 100 UNIT/ML McDonald SOLN Subcutaneous Inject into the skin 3 (three) times daily before meals.      . INSULIN GLARGINE 100 UNIT/ML  SOLN Subcutaneous Inject into the skin at bedtime.      Marland Kitchen METFORMIN HCL ER (MOD) 1000 MG PO  TB24 Oral Take 1,000 mg by mouth daily with breakfast.      . TOPIRAMATE 100 MG PO TABS Oral Take 100 mg by mouth 2 (two) times daily.        BP 99/67  Pulse 108  Temp(Src) 99.1 F (37.3 C) (Oral)  Resp 18  SpO2 94%  Physical Exam  Nursing note and vitals reviewed. Constitutional: She is oriented to person, place, and time. She appears well-developed and well-nourished. No distress.  HENT:  Head: Normocephalic and atraumatic.  Mouth/Throat: Oropharynx is clear and moist.  Eyes: Conjunctivae and EOM are normal. Pupils are equal, round, and reactive to light.  Neck: Normal range of motion.  Cardiovascular: Normal rate, regular rhythm and normal heart sounds.   Pulmonary/Chest: Effort normal and breath sounds normal. No respiratory distress. She has no wheezes.  Abdominal: Soft.  Neurological: She is alert and oriented to person, place, and time. She displays normal reflexes. No cranial nerve  deficit.       Patient demonstrates intermittent signs of weakness on the left side extremities. This improves with distraction during conversation. Patient also with intermittent difficulties with speech but this seems to improve with time.  Skin: Skin is warm and dry. No rash noted.  Psychiatric: She has a normal mood and affect. Her behavior is normal.    ED Course  Procedures     Results for orders placed during the hospital encounter of 01/27/11  CBC      Component Value Range   WBC 11.2 (*) 4.0 - 10.5 (K/uL)   RBC 5.19 (*) 3.87 - 5.11 (MIL/uL)   Hemoglobin 13.8  12.0 - 15.0 (g/dL)   HCT 16.1  09.6 - 04.5 (%)   MCV 81.7  78.0 - 100.0 (fL)   MCH 26.6  26.0 - 34.0 (pg)   MCHC 32.5  30.0 - 36.0 (g/dL)   RDW 40.9  81.1 - 91.4 (%)   Platelets 255  150 - 400 (K/uL)  BASIC METABOLIC PANEL      Component Value Range   Sodium 133 (*) 135 - 145 (mEq/L)   Potassium 3.7  3.5 - 5.1 (mEq/L)   Chloride 96  96 - 112 (mEq/L)   CO2 23  19 - 32 (mEq/L)   Glucose, Bld 102 (*) 70 - 99 (mg/dL)   BUN 8  6 - 23 (mg/dL)   Creatinine, Ser 7.82  0.50 - 1.10 (mg/dL)   Calcium 9.9  8.4 - 95.6 (mg/dL)   GFR calc non Af Amer >90  >90 (mL/min)   GFR calc Af Amer >90  >90 (mL/min)    No results found.   1. Headache   2. Conversion disorder       MDM   Pt seen and evaluated.  Initial work up with labs already ordered.  Some results have crossed over.  Pt has visits in past with similar complaints.  She has been worked up for these symptoms by specialists.  She reports the only thing that helps is dilaudid and phenergan.  After leaving room I was called by pt's husband from home.  He stated pt just called him from her cell phone and asked him to call me to talk about her condition.  He states she was talking to him despite her not being able to talk clearly while I was in room.  He states that when she comes to ED in past they "give her Dilaudid and phenergan with IV fluids...and she is usually  better in  4 hrs".   Pt with no concerning findings.  She has hx of similar symptoms with conversion disorder.  She denies any new stress at home.  No depression SI/HI.  Will d/c home.      Angus Seller, PA 01/28/11 0141  Angus Seller, PA 04/13/11 2136

## 2011-01-28 NOTE — ED Notes (Signed)
Pt medicated as ordered, SEE MAR

## 2011-01-30 ENCOUNTER — Encounter (HOSPITAL_COMMUNITY): Payer: Self-pay | Admitting: Emergency Medicine

## 2011-02-05 NOTE — ED Provider Notes (Signed)
Medical screening examination/treatment/procedure(s) were performed by non-physician practitioner and as supervising physician I was immediately available for consultation/collaboration.  Suzi Roots, MD 02/05/11 [phone]

## 2011-03-08 ENCOUNTER — Encounter (HOSPITAL_COMMUNITY): Payer: Self-pay | Admitting: Emergency Medicine

## 2011-03-08 ENCOUNTER — Observation Stay (HOSPITAL_COMMUNITY)
Admission: EM | Admit: 2011-03-08 | Discharge: 2011-03-12 | Disposition: A | Discharge status: Home / Self Care | Payer: BC Managed Care – PPO | Attending: Internal Medicine | Admitting: Internal Medicine

## 2011-03-08 DIAGNOSIS — G43409 Hemiplegic migraine, not intractable, without status migrainosus: Principal | ICD-10-CM | POA: Diagnosis present

## 2011-03-08 DIAGNOSIS — G8190 Hemiplegia, unspecified affecting unspecified side: Secondary | ICD-10-CM

## 2011-03-08 DIAGNOSIS — G894 Chronic pain syndrome: Secondary | ICD-10-CM | POA: Insufficient documentation

## 2011-03-08 DIAGNOSIS — IMO0001 Reserved for inherently not codable concepts without codable children: Secondary | ICD-10-CM | POA: Insufficient documentation

## 2011-03-08 DIAGNOSIS — A088 Other specified intestinal infections: Secondary | ICD-10-CM | POA: Insufficient documentation

## 2011-03-08 DIAGNOSIS — E669 Obesity, unspecified: Secondary | ICD-10-CM | POA: Insufficient documentation

## 2011-03-08 DIAGNOSIS — R51 Headache: Secondary | ICD-10-CM

## 2011-03-08 DIAGNOSIS — G4700 Insomnia, unspecified: Secondary | ICD-10-CM | POA: Insufficient documentation

## 2011-03-08 DIAGNOSIS — Z79899 Other long term (current) drug therapy: Secondary | ICD-10-CM | POA: Insufficient documentation

## 2011-03-08 DIAGNOSIS — F449 Dissociative and conversion disorder, unspecified: Secondary | ICD-10-CM | POA: Clinically undetermined

## 2011-03-08 DIAGNOSIS — F3289 Other specified depressive episodes: Secondary | ICD-10-CM | POA: Insufficient documentation

## 2011-03-08 DIAGNOSIS — F329 Major depressive disorder, single episode, unspecified: Secondary | ICD-10-CM | POA: Insufficient documentation

## 2011-03-08 DIAGNOSIS — F411 Generalized anxiety disorder: Secondary | ICD-10-CM | POA: Insufficient documentation

## 2011-03-08 DIAGNOSIS — E1165 Type 2 diabetes mellitus with hyperglycemia: Secondary | ICD-10-CM | POA: Diagnosis present

## 2011-03-08 DIAGNOSIS — IMO0002 Reserved for concepts with insufficient information to code with codable children: Secondary | ICD-10-CM | POA: Diagnosis present

## 2011-03-08 DIAGNOSIS — E785 Hyperlipidemia, unspecified: Secondary | ICD-10-CM | POA: Insufficient documentation

## 2011-03-08 DIAGNOSIS — R197 Diarrhea, unspecified: Secondary | ICD-10-CM | POA: Diagnosis present

## 2011-03-08 DIAGNOSIS — R109 Unspecified abdominal pain: Secondary | ICD-10-CM | POA: Diagnosis present

## 2011-03-08 LAB — BASIC METABOLIC PANEL
BUN: 9 mg/dL (ref 6–23)
Chloride: 103 mEq/L (ref 96–112)
Creatinine, Ser: 0.58 mg/dL (ref 0.50–1.10)
Glucose, Bld: 104 mg/dL — ABNORMAL HIGH (ref 70–99)
Potassium: 3.9 mEq/L (ref 3.5–5.1)

## 2011-03-08 LAB — DIFFERENTIAL
Eosinophils Relative: 2 % (ref 0–5)
Lymphs Abs: 3.5 10*3/uL (ref 0.7–4.0)
Monocytes Absolute: 0.4 10*3/uL (ref 0.1–1.0)
Monocytes Relative: 4 % (ref 3–12)
Neutro Abs: 5.9 10*3/uL (ref 1.7–7.7)

## 2011-03-08 LAB — CBC
HCT: 40 % (ref 36.0–46.0)
MCH: 26.2 pg (ref 26.0–34.0)
MCV: 81.1 fL (ref 78.0–100.0)
Platelets: ADEQUATE 10*3/uL (ref 150–400)
RBC: 4.93 MIL/uL (ref 3.87–5.11)
RDW: 13.5 % (ref 11.5–15.5)
WBC: 10 10*3/uL (ref 4.0–10.5)

## 2011-03-08 MED ORDER — HYDROMORPHONE HCL PF 2 MG/ML IJ SOLN
2.0000 mg | Freq: Once | INTRAMUSCULAR | Status: AC
  Administered 2011-03-08: 2 mg via INTRAVENOUS
  Filled 2011-03-08: qty 1

## 2011-03-08 MED ORDER — SODIUM CHLORIDE 0.9 % IV BOLUS (SEPSIS)
1000.0000 mL | Freq: Once | INTRAVENOUS | Status: AC
  Administered 2011-03-08: 1000 mL via INTRAVENOUS

## 2011-03-08 MED ORDER — PROMETHAZINE HCL 25 MG/ML IJ SOLN
12.5000 mg | Freq: Once | INTRAMUSCULAR | Status: AC
  Administered 2011-03-08: 12.5 mg via INTRAVENOUS
  Filled 2011-03-08 (×2): qty 1

## 2011-03-08 NOTE — ED Notes (Signed)
YQM:VH84<ON> Expected date:03/08/11<BR> Expected time:<BR> Means of arrival:Ambulance<BR> Comments:<BR> M40 - 35yoF Migraine with hx

## 2011-03-08 NOTE — ED Notes (Signed)
CBG 102; RN Ginger notified

## 2011-03-08 NOTE — ED Provider Notes (Signed)
History     CSN: [id number] Arrival date & time: 03/08/2011  6:13 PM   First MD Initiated Contact with Patient 03/08/11 1846      Chief Complaint  Patient presents with  . Numbness    Pt has had this systoms before ( 8x) but now c/o h/a with facial droop slurred speech    (Consider location/radiation/quality/duration/timing/severity/associated sxs/prior treatment) HPI Patient has history of hemiplegic migraines. She has had multiple evaluations in the emergency room to for this complaint. During these migraines she will have the ability inability to speak as well as hemiparesis of her left side. Patient currently is unable to speak and will not move the left side of her body. Old records have indicated she has had neurology consult in the past and she has had multiple MRIs that have not shown any signs of stroke. Patient states her headache is severe at this time. She is communicating by writing on a piece of paper. She just moans when she tries to speak. There's been no recent injury or fevers. The symptoms are described as severe Past Medical History  Diagnosis Date  . Depression   . Anxiety   . Diabetes mellitus   . GERD (gastroesophageal reflux disease)   . Migraine     History reviewed. No pertinent past surgical history.  History reviewed. No pertinent family history.  History  Substance Use Topics  . Smoking status: Never Smoker   . Smokeless tobacco: Not on file  . Alcohol Use: Yes    OB History    Grav Para Term Preterm Abortions TAB SAB Ect Mult Living                  Review of Systems  All other systems reviewed and are negative.    Allergies  Atarax; Cephalosporins; Codeine; Compazine; Decadron; Demerol; Morphine and related; Penicillins; Reglan; Sulfa antibiotics; and Zofran  Home Medications   Current Outpatient Rx  Name Route Sig Dispense Refill  . ALPRAZOLAM 1 MG PO TABS Oral Take 1 mg by mouth daily as needed. anxiety    .  AMPHETAMINE-DEXTROAMPHETAMINE 30 MG PO TABS Oral Take 30 mg by mouth daily as needed. For concentration    . ARIPIPRAZOLE 5 MG PO TABS Oral Take 5 mg by mouth at bedtime.     . CHLORPROMAZINE HCL 25 MG PO TABS Oral Take 25 mg by mouth daily as needed. headaches    . CYCLOBENZAPRINE HCL 10 MG PO TABS Oral Take 10 mg by mouth 3 (three) times daily as needed. Headaches    . DESVENLAFAXINE SUCCINATE ER 50 MG PO TB24 Oral Take 50 mg by mouth daily.      . FENOFIBRATE MICRONIZED 200 MG PO CAPS Oral Take 200 mg by mouth every evening.     Marland Kitchen HYDROMORPHONE HCL 4 MG PO TABS Oral Take 4 mg by mouth 2 (two) times daily as needed. Herniated disk     . INSULIN ASPART 100 UNIT/ML [place] SOLN Subcutaneous Inject 6-10 Units into the skin 3 (three) times daily before meals. 6units breakfast 8units lunch 10 units dinnertime.    . INSULIN GLARGINE 100 UNIT/ML [place] SOLN Subcutaneous Inject 30 Units into the skin at bedtime.     Marland Kitchen LIRAGLUTIDE 18 MG/3ML [place] SOLN Subcutaneous Inject 1.8 mLs into the skin daily after lunch daily after lunch.      . METFORMIN HCL ER (MOD) 1000 MG PO TB24 Oral Take 1,000 mg by mouth every evening.     Marland Kitchen  PRAVASTATIN SODIUM 40 MG PO TABS Oral Take 40 mg by mouth daily.      . TOPIRAMATE 100 MG PO TABS Oral Take 100 mg by mouth every evening.     Marland Kitchen ESZOPICLONE 3 MG PO TABS Oral Take 3 mg by mouth at bedtime. Take immediately before bedtime ; hasnt started medication yet.       BP 105/67  Pulse 99  Temp(Src) 98.2 F (36.8 C) (Oral)  Resp 18  SpO2 96%  Physical Exam  Nursing note and vitals reviewed. Constitutional: She appears well-developed and well-nourished. No distress.  HENT:  Head: Normocephalic and atraumatic.  Right Ear: External ear normal.  Left Ear: External ear normal.  Eyes: Conjunctivae are normal. Right eye exhibits no discharge. Left eye exhibits no discharge. No scleral icterus.  Neck: Neck supple. No tracheal deviation present.  Cardiovascular: Normal rate, regular  rhythm and intact distal pulses.   Pulmonary/Chest: Effort normal and breath sounds normal. No stridor. No respiratory distress. She has no wheezes. She has no rales.  Abdominal: Soft. Bowel sounds are normal. She exhibits no distension. There is no tenderness. There is no rebound and no guarding.  Musculoskeletal: She exhibits no edema and no tenderness.  Neurological: She is alert. She has normal strength. No sensory deficit. Cranial nerve deficit:  no gross defecits noted. She exhibits normal muscle tone. She displays no seizure activity.       Left hemiparesis, no facial droop, tongue midline, extraocular movements intact, patient moans  Skin: Skin is warm and dry. No rash noted.  Psychiatric: She has a normal mood and affect.    ED Course  Procedures (including critical care time)  Labs Reviewed  GLUCOSE, CAPILLARY - Abnormal; Notable for the following:    Glucose-Capillary 102 (*)    All other components within normal limits  BASIC METABOLIC PANEL - Abnormal; Notable for the following:    Glucose, Bld 104 (*)    All other components within normal limits  POCT CBG MONITORING  CBC  DIFFERENTIAL   No results found.   No diagnosis found.    MDM  Pt states she is still having a headache.  Will order another dose of pain medications.  Still stating that she is having trouble speaking and moving the left side of her body. 9:07 PM   10:50 PM patient states she still having headache and is not able to move her left arm and left leg. I reviewed her old records. Patient has history of conversion disorder or depressive episode. She has had neurology and psychiatric evaluations. She had several MRIs in the past that never showed signs of ischemia. Unfortunately this time the patient is unable or unwilling to get up and move. I will consult the hospitalist service for possible admission for observation and continued therapy.   Celene Kras, MD 03/09/11 [phone]

## 2011-03-09 ENCOUNTER — Emergency Department (HOSPITAL_COMMUNITY): Payer: BC Managed Care – PPO

## 2011-03-09 ENCOUNTER — Encounter (HOSPITAL_COMMUNITY): Payer: Self-pay | Admitting: *Deleted

## 2011-03-09 LAB — BASIC METABOLIC PANEL
CO2: 25 mEq/L (ref 19–32)
Chloride: 102 mEq/L (ref 96–112)
Glucose, Bld: 122 mg/dL — ABNORMAL HIGH (ref 70–99)
Potassium: 3.8 mEq/L (ref 3.5–5.1)
Sodium: 137 mEq/L (ref 135–145)

## 2011-03-09 LAB — CBC
Hemoglobin: 11.8 g/dL — ABNORMAL LOW (ref 12.0–15.0)
MCH: 25.6 pg — ABNORMAL LOW (ref 26.0–34.0)
MCV: 80.7 fL (ref 78.0–100.0)
RBC: 4.61 MIL/uL (ref 3.87–5.11)
WBC: 8.2 10*3/uL (ref 4.0–10.5)

## 2011-03-09 LAB — GLUCOSE, CAPILLARY: Glucose-Capillary: 146 mg/dL — ABNORMAL HIGH (ref 70–99)

## 2011-03-09 LAB — HEMOGLOBIN A1C: Mean Plasma Glucose: 160 mg/dL — ABNORMAL HIGH (ref <117)

## 2011-03-09 MED ORDER — PROMETHAZINE HCL 25 MG/ML IJ SOLN
12.5000 mg | Freq: Four times a day (QID) | INTRAMUSCULAR | Status: DC | PRN
  Administered 2011-03-09 – 2011-03-10 (×5): 12.5 mg via INTRAVENOUS
  Filled 2011-03-09 (×5): qty 1

## 2011-03-09 MED ORDER — ALUM & MAG HYDROXIDE-SIMETH 200-200-20 MG/5ML PO SUSP: 30.0000 mL | Freq: Four times a day (QID) | ORAL | Status: DC | PRN

## 2011-03-09 MED ORDER — HYDROMORPHONE HCL PF 1 MG/ML IJ SOLN
0.5000 mg | INTRAMUSCULAR | Status: DC | PRN
  Administered 2011-03-09 – 2011-03-10 (×11): 1 mg via INTRAVENOUS
  Filled 2011-03-09 (×11): qty 1

## 2011-03-09 MED ORDER — SODIUM CHLORIDE 0.9 % IV SOLN
INTRAVENOUS | Status: DC
  Administered 2011-03-09 – 2011-03-10 (×4): via INTRAVENOUS

## 2011-03-09 MED ORDER — INSULIN ASPART 100 UNIT/ML ~~LOC~~ SOLN
0.0000 [IU] | Freq: Three times a day (TID) | SUBCUTANEOUS | Status: DC
  Administered 2011-03-09 (×2): 3 [IU] via SUBCUTANEOUS
  Administered 2011-03-09: 4 [IU] via SUBCUTANEOUS
  Administered 2011-03-10 (×2): 3 [IU] via SUBCUTANEOUS
  Administered 2011-03-10: 4 [IU] via SUBCUTANEOUS
  Administered 2011-03-11: 7 [IU] via SUBCUTANEOUS
  Administered 2011-03-11 (×2): 3 [IU] via SUBCUTANEOUS
  Administered 2011-03-12 (×2): 4 [IU] via SUBCUTANEOUS
  Filled 2011-03-09: qty 3

## 2011-03-09 MED ORDER — INSULIN ASPART 100 UNIT/ML ~~LOC~~ SOLN: 0.0000 [IU] | Freq: Every day | SUBCUTANEOUS | Status: DC

## 2011-03-09 MED ORDER — ACETAMINOPHEN 325 MG PO TABS: 650.0000 mg | ORAL_TABLET | Freq: Four times a day (QID) | ORAL | Status: DC | PRN

## 2011-03-09 MED ORDER — ZOLPIDEM TARTRATE 5 MG PO TABS
5.0000 mg | ORAL_TABLET | Freq: Every evening | ORAL | Status: DC | PRN
  Administered 2011-03-10 – 2011-03-11 (×2): 5 mg via ORAL
  Filled 2011-03-09 (×2): qty 1

## 2011-03-09 MED ORDER — ENOXAPARIN SODIUM 40 MG/0.4ML ~~LOC~~ SOLN
40.0000 mg | SUBCUTANEOUS | Status: DC
  Administered 2011-03-09 – 2011-03-10 (×2): 40 mg via SUBCUTANEOUS
  Filled 2011-03-09 (×2): qty 0.4

## 2011-03-09 MED ORDER — ACETAMINOPHEN 650 MG RE SUPP: 650.0000 mg | Freq: Four times a day (QID) | RECTAL | Status: DC | PRN

## 2011-03-09 NOTE — Progress Notes (Signed)
4:52 PM I agree with HPI/GPe and A/P per Dr. Lovell Sheehan I have taken car eof [name] before-she had these issues and no discernible cause could be found.   If no improvement will involve urology again in her care Rest per dr. Lovell Sheehan HPI      Pleas Koch, MD [facility] [phone]

## 2011-03-09 NOTE — H&P (Signed)
DATE OF ADMISSION:  03/09/2011  PCP:   Berenda Morale, MD   Chief Complaint:  Migraine Headache and left sided weakness  HPI: [name] is an 35 y.o. female with a history of Migraine headaches, who presents with complaints of severe headache and left sided weakness, with difficulty speaking x 1 day.  She reports having Right side headache described as sharp and throbbing rated as  8/10 in severity.  She reports having nausea phototphobia and neck pain, she denies fever and neck stiffness.  She has been hospitalized 8 times in the past for similar symptoms which usually resolve in 3 days depending on the severity.  She has had numerous CT scan of the Head and MRIs of the Brain with negative pathological results.    Past Medical History  Diagnosis Date  . Depression   . Anxiety   . Diabetes mellitus   . GERD (gastroesophageal reflux disease)   . Migraine     Medications:  HOME MEDS: Prior to Admission medications   Medication Sig Start Date End Date Taking? Authorizing Provider  ALPRAZolam Prudy Feeler) 1 MG tablet Take 1 mg by mouth daily as needed. anxiety   Yes Historical Provider, MD  amphetamine-dextroamphetamine (ADDERALL) 30 MG tablet Take 30 mg by mouth daily as needed. For concentration   Yes Historical Provider, MD  ARIPiprazole (ABILIFY) 5 MG tablet Take 5 mg by mouth at bedtime.    Yes Historical Provider, MD  chlorproMAZINE (THORAZINE) 25 MG tablet Take 25 mg by mouth daily as needed. headaches   Yes Historical Provider, MD  cyclobenzaprine (FLEXERIL) 10 MG tablet Take 10 mg by mouth 3 (three) times daily as needed. Headaches   Yes Historical Provider, MD  desvenlafaxine (PRISTIQ) 50 MG 24 hr tablet Take 50 mg by mouth daily.     Yes Historical Provider, MD  fenofibrate micronized (LOFIBRA) 200 MG capsule Take 200 mg by mouth every evening.    Yes Historical Provider, MD  HYDROmorphone (DILAUDID) 4 MG tablet Take 4 mg by mouth 2 (two) times daily as needed. Herniated disk     Yes Historical Provider, MD  insulin aspart (NOVOLOG) 100 UNIT/ML injection Inject 6-10 Units into the skin 3 (three) times daily before meals. 6units breakfast 8units lunch 10 units dinnertime.   Yes Historical Provider, MD  insulin glargine (LANTUS) 100 UNIT/ML injection Inject 30 Units into the skin at bedtime.    Yes Historical Provider, MD  Liraglutide (VICTOZA) 18 MG/3ML SOLN Inject 1.8 mLs into the skin daily after lunch daily after lunch.     Yes Historical Provider, MD  metFORMIN (GLUMETZA) 1000 MG (MOD) 24 hr tablet Take 1,000 mg by mouth every evening.    Yes Historical Provider, MD  pravastatin (PRAVACHOL) 40 MG tablet Take 40 mg by mouth daily.     Yes Historical Provider, MD  topiramate (TOPAMAX) 100 MG tablet Take 100 mg by mouth every evening.    Yes Historical Provider, MD  Eszopiclone 3 MG TABS Take 3 mg by mouth at bedtime. Take immediately before bedtime ; hasnt started medication yet.     Historical Provider, MD    Allergies:  Allergies  Allergen Reactions  . Atarax (Hydroxyzine Hcl) Anaphylaxis  . Cephalosporins Anaphylaxis  . Codeine Anaphylaxis  . Compazine   . Decadron (Dexamethasone)   . Demerol   . Morphine And Related   . Penicillins   . Reglan   . Sulfa Antibiotics   . Zofran     Social History:  Family History:   Review of Systems:  The patient denies anorexia, fever, weight loss, vision loss, decreased hearing, hoarseness, chest pain, syncope, dyspnea on exertion, peripheral edema, balance deficits, hemoptysis, abdominal pain, melena, hematochezia, severe indigestion/heartburn, hematuria, incontinence, genital sores, muscle weakness, suspicious skin lesions, transient blindness, difficulty walking, depression, unusual weight change, abnormal bleeding, enlarged lymph nodes, angioedema, and breast masses.   Physical Exam:  GEN:  Pleasant 36 year old morbidly obese Caucasian female  examined  and in no acute distress; cooperative with exam Filed  Vitals:   03/08/11 1813  BP: 105/67  Pulse: 99  Temp: 98.2 F (36.8 C)  TempSrc: Oral  Resp: 18  SpO2: 96%   Blood pressure 105/67, pulse 99, temperature 98.2 F (36.8 C), temperature source Oral, resp. rate 18, SpO2 96.00%. PSYCH: SHe is alert and oriented x4; does not appear anxious does not appear depressed; affect is normal HEENT: Normocephalic and Atraumatic, Mucous membranes pink; PERRLA; EOM intact; Fundi:  Benign;  No scleral icterus, Nares: Patent, Oropharynx: Clear, Fair Dentition, Neck:  FROM, no cervical lymphadenopathy nor thyromegaly or carotid bruit; no JVD; Breasts:: Not examined CHEST WALL: No tenderness CHEST: Normal respiration, clear to auscultation bilaterally HEART: Regular rate and rhythm; no murmurs rubs or gallops BACK: No kyphosis or scoliosis; no CVA tenderness ABDOMEN: Positive Bowel Sounds, Obese, soft non-tender; no masses, no organomegaly, no pannus; no intertriginous candida. Rectal Exam: Not done EXTREMITIES: No bone or joint deformity; age-appropriate arthropathy of the hands and knees; no cyanosis, clubbing or edema; no ulcerations. Genitalia: not examined PULSES: 2+ and symmetric SKIN: Normal hydration no rash or ulceration CNS: Cranial nerves 2-12 intact,+ Left sided hemiplegia.      Labs & Imaging Results for orders placed during the hospital encounter of 03/08/11 (from the past 48 hour(s))  GLUCOSE, CAPILLARY     Status: Abnormal   Collection Time   03/08/11  8:31 PM      Component Value Range Comment   Glucose-Capillary 102 (*) 70 - 99 (mg/dL)    Comment 1 Notify RN     CBC     Status: Normal   Collection Time   03/08/11  9:50 PM      Component Value Range Comment   WBC 10.0  4.0 - 10.5 (K/uL) WHITE COUNT CONFIRMED ON SMEAR   RBC 4.93  3.87 - 5.11 (MIL/uL)    Hemoglobin 12.9  12.0 - 15.0 (g/dL)    HCT 16.1  09.6 - 04.5 (%)    MCV 81.1  78.0 - 100.0 (fL)    MCH 26.2  26.0 - 34.0 (pg)    MCHC 32.3  30.0 - 36.0 (g/dL)    RDW 40.9   81.1 - 91.4 (%)    Platelets    150 - 400 (K/uL)    Value: PLATELET CLUMPS NOTED ON SMEAR, COUNT APPEARS ADEQUATE  DIFFERENTIAL     Status: Normal   Collection Time   03/08/11  9:50 PM      Component Value Range Comment   Neutrophils Relative 59  43 - 77 (%)    Lymphocytes Relative 35  12 - 46 (%)    Monocytes Relative 4  3 - 12 (%)    Eosinophils Relative 2  0 - 5 (%)    Basophils Relative 0  0 - 1 (%)    Neutro Abs 5.9  1.7 - 7.7 (K/uL)    Lymphs Abs 3.5  0.7 - 4.0 (K/uL)    Monocytes Absolute 0.4  0.1 -  1.0 (K/uL)    Eosinophils Absolute 0.2  0.0 - 0.7 (K/uL)    Basophils Absolute 0.0  0.0 - 0.1 (K/uL)    WBC Morphology WHITE COUNT CONFIRMED ON SMEAR      Smear Review PLATELET COUNT CONFIRMED BY SMEAR   PLATELET CLUMPS NOTED ON SMEAR, COUNT APPEARS ADEQUATE  BASIC METABOLIC PANEL     Status: Abnormal   Collection Time   03/08/11  9:50 PM      Component Value Range Comment   Sodium 138  135 - 145 (mEq/L)    Potassium 3.9  3.5 - 5.1 (mEq/L)    Chloride 103  96 - 112 (mEq/L)    CO2 23  19 - 32 (mEq/L)    Glucose, Bld 104 (*) 70 - 99 (mg/dL)    BUN 9  6 - 23 (mg/dL)    Creatinine, Ser 1.61  0.50 - 1.10 (mg/dL)    Calcium 9.3  8.4 - 10.5 (mg/dL)    GFR calc non Af Amer >90  >90 (mL/min)    GFR calc Af Amer >90  >90 (mL/min)    No results found.    Assessment/Plan: 1.  Left hemiplegia- Most likely due to a Complex Migraine Headache, neuro checks have been ordered, and a CTscan of the Head will be ordered.   2.  Headache- Hx of Migraines, consistent with Migraine Headache.   Pain control ordered.   3.  DM 2-  Continue Regular Meds, and SSI.   4.  Obesity- unchanged 5.  Chronic Pain Syndrome    Other plans as per orders.    CODE STATUS:      FULL CODE        Spike Desilets C 03/09/2011, 12:06 AM

## 2011-03-09 NOTE — Progress Notes (Signed)
UR CHART REVIEWED; B Konstantine Gervasi RN, BSN, MHA 

## 2011-03-09 NOTE — ED Notes (Signed)
16 fr foley inserted using sterile technique by G. Manson Passey, RN @ [phone]

## 2011-03-10 DIAGNOSIS — R197 Diarrhea, unspecified: Secondary | ICD-10-CM | POA: Diagnosis present

## 2011-03-10 DIAGNOSIS — F449 Dissociative and conversion disorder, unspecified: Secondary | ICD-10-CM | POA: Clinically undetermined

## 2011-03-10 DIAGNOSIS — E1165 Type 2 diabetes mellitus with hyperglycemia: Secondary | ICD-10-CM | POA: Diagnosis present

## 2011-03-10 DIAGNOSIS — R109 Unspecified abdominal pain: Secondary | ICD-10-CM | POA: Diagnosis present

## 2011-03-10 DIAGNOSIS — G43409 Hemiplegic migraine, not intractable, without status migrainosus: Secondary | ICD-10-CM | POA: Diagnosis present

## 2011-03-10 LAB — GLUCOSE, CAPILLARY: Glucose-Capillary: 148 mg/dL — ABNORMAL HIGH (ref 70–99)

## 2011-03-10 MED ORDER — ENOXAPARIN SODIUM 60 MG/0.6ML ~~LOC~~ SOLN
60.0000 mg | SUBCUTANEOUS | Status: DC
  Administered 2011-03-11 – 2011-03-12 (×2): 60 mg via SUBCUTANEOUS
  Filled 2011-03-10 (×2): qty 0.6

## 2011-03-10 MED ORDER — PROMETHAZINE HCL 25 MG PO TABS
12.5000 mg | ORAL_TABLET | Freq: Four times a day (QID) | ORAL | Status: DC | PRN
  Administered 2011-03-11: 12.5 mg via ORAL
  Filled 2011-03-10: qty 1

## 2011-03-10 MED ORDER — OXYCODONE-ACETAMINOPHEN 5-325 MG PO TABS
2.0000 | ORAL_TABLET | ORAL | Status: DC | PRN
  Administered 2011-03-10 – 2011-03-12 (×5): 2 via ORAL
  Filled 2011-03-10 (×5): qty 2

## 2011-03-10 NOTE — Progress Notes (Signed)
[facility] progress note    Interval h/o:-  35 y/o female with known Migraine disorder-treated in the past in [place], and multiple hospitalizations with hemiplegic like migraines represented 12.17 with recurrence.   Has had multiple MRI/CT scans CT repeated 12.17 neg for acute intracranial activity    Subjective: Better.  Can move a little more-actually was up with assistacne to bedside commode per RN C/o 1-2 episodes of diarrhoea-not foul Has some abd pain and nausea   Objective: Vital signs in last 24 hours: Temp:  [98.2 F (36.8 C)-99.1 F (37.3 C)] 99 F (37.2 C) (12/18 1345) Pulse Rate:  [80-88] 88  (12/18 1345) Resp:  [16-18] 16  (12/18 1345) BP: (98-106)/(63-70) 98/63 mmHg (12/18 1345) SpO2:  [97 %-98 %] 97 % (12/18 1345) Weight change:   Intake/Output Summary (Last 24 hours) at 03/10/11 1652 Last data filed at 03/10/11 1500  Gross per 24 hour  Intake   2626 ml  Output   5900 ml  Net  -3274 ml    BP 98/63  Pulse 88  Temp(Src) 99 F (37.2 C) (Oral)  Resp 16  Ht 5\' 7"  (1.702 m)  Wt 133.2 kg (293 lb 10.4 oz)  BMI 45.99 kg/m2  SpO2 97%  LMP 03/08/2008 General appearance: alert and cooperative Neck: no adenopathy, no carotid bruit, no JVD, supple, symmetrical, trachea midline and thyroid not enlarged, symmetric, no tenderness/mass/nodules Lungs: clear to auscultation bilaterally Heart: regular rate and rhythm, S1, S2 normal, no murmur, click, rub or gallop Abdomen: soft, non-tender; bowel sounds normal; no masses,  no organomegaly Pulses: 2+ and symmetric Neurologic: Mental status: Alert, oriented, thought content appropriate Motor: poor supraspinatus bilaterally Reflexes: 2+ and symmetric Coordination: unable to accurately assess as cannot participate completely on the L sdie Grade 0 power on entire L sode, no twisting of mouth.  Some slurring of speech  Lab Results:  [facility] 03/09/11 0330 03/08/11 2150  NA 137 138  K 3.8 3.9  CL 102  103  CO2 25 23  GLUCOSE 122* 104*  BUN 8 9  CREATININE 0.61 0.58  CALCIUM 9.0 9.3  MG -- --  PHOS -- --     Basename 03/09/11 0330 03/08/11 2150  WBC 8.2 10.0  NEUTROABS -- 5.9  HGB 11.8* 12.9  HCT 37.2 40.0  MCV 80.7 81.1  PLT 234 PLATELET CLUMPS NOTED ON SMEAR, COUNT APPEARS ADEQUATE     Basename 03/09/11 0330  HGBA1C 7.2*    Medications: I have reviewed the patient's current medications. Scheduled Meds:   . enoxaparin (LOVENOX) injection  60 mg Subcutaneous Q24H  . insulin aspart  0-20 Units Subcutaneous TID WC  . insulin aspart  0-5 Units Subcutaneous QHS  . DISCONTD: enoxaparin  40 mg Subcutaneous Q24H   Continuous Infusions:   . sodium chloride 100 mL/hr at 03/10/11 1400   PRN Meds:.acetaminophen, acetaminophen, alum & mag hydroxide-simeth, oxyCODONE-acetaminophen, promethazine, zolpidem, DISCONTD: HYDROmorphone, DISCONTD: promethazine   Assessment/Plan: Patient Active Hospital Problem List: Hemiplegic migraine (03/10/2011)   Assessment: *continue supportive management-she has had many of these issues before and spent about 7-10 days in the hopsital at last admission and then suddenly got better-do not suspect Neurology will have more to offer, nor will psychiatry  Conversion disorder (03/10/2011)   Assessment: see above.  Consulted on last admission  Diabetes type 2, uncontrolled (03/10/2011)   Assessment: moderate control with sugars in 140-180's   Abdominal pain (03/10/2011)   Assessment: Continue percocet + phenergan-if persists woudl get acute abd series  Diarrhea (03/10/2011)   Assessment: 2 episodes-low probability Cdiff given age, no ppi, no recnet abx--monitor-work-up if worsend or persists      LOS: 2 days   Madalina Rosman,JAI 03/10/2011, 4:52 PM

## 2011-03-11 LAB — GLUCOSE, CAPILLARY: Glucose-Capillary: 139 mg/dL — ABNORMAL HIGH (ref 70–99)

## 2011-03-11 LAB — CLOSTRIDIUM DIFFICILE BY PCR: Toxigenic C. Difficile by PCR: NEGATIVE

## 2011-03-11 MED ORDER — INSULIN GLARGINE 100 UNIT/ML ~~LOC~~ SOLN
10.0000 [IU] | Freq: Every day | SUBCUTANEOUS | Status: DC
  Administered 2011-03-11: 10 [IU] via SUBCUTANEOUS
  Filled 2011-03-11: qty 3

## 2011-03-11 NOTE — Progress Notes (Signed)
PATIENT DETAILS Name: [name] Age: 35 y.o. Sex: female Date of Birth: [birth year] Admit Date: 03/08/2011 ZOX:[id number],UJWJ, MD  CONSULTS: None  Interval History: [name] is a 35 year old female well-known to the hospitalist service do to multiple hospital admissions for treatment of hemiplegic migraine headaches. Patient typically comes into the hospital for several days with hemiparesis. She was admitted on 03/09/2011 with her usual complaints of migraine headache and left-sided hemiparesis.  ROS: Patient still is unable to move her left side but feels things are improving. She's also complaining of diarrhea for the past 3 days. No associated vomiting but some mild nausea although she is noted to be eating all of her meals.   Objective: Vital signs in last 24 hours: Temp:  [97.5 F (36.4 C)-99 F (37.2 C)] 97.5 F (36.4 C) (12/19 1100) Pulse Rate:  [70-88] 78  (12/19 1100) Resp:  [16-18] 18  (12/19 1100) BP: (98-119)/(63-78) 100/66 mmHg (12/19 1100) SpO2:  [94 %-98 %] 94 % (12/19 1100) Weight change:  Last BM Date: 03/10/11  Intake/Output from previous day:  Intake/Output Summary (Last 24 hours) at 03/11/11 1227 Last data filed at 03/11/11 0543  Gross per 24 hour  Intake   1500 ml  Output   6000 ml  Net  -4500 ml     Physical Exam:  Gen:  NAD Cardiovascular:  RRR, No M/R/G Respiratory: Lungs CTAB Gastrointestinal: Abdomen soft, NT/ND with normal active bowel sounds. Extremities: No C/E/C. Left hemiparesis noted.    Lab Results: Basic Metabolic Panel:  Lab 03/09/11 1914 03/08/11 2150  NA 137 138  K 3.8 3.9  CL 102 103  CO2 25 23  GLUCOSE 122* 104*  BUN 8 9  CREATININE 0.61 0.58  CALCIUM 9.0 9.3  MG -- --  PHOS -- --   GFR Estimated Creatinine Clearance: 141.1 ml/min (by C-G formula based on Cr of 0.61).  CBC:  Lab 03/09/11 0330 03/08/11 2150  WBC 8.2 10.0  NEUTROABS -- 5.9  HGB 11.8* 12.9  HCT 37.2 40.0  MCV 80.7 81.1  PLT 234  PLATELET CLUMPS NOTED ON SMEAR, COUNT APPEARS ADEQUATE   CBG:  Lab 03/11/11 1155 03/11/11 0744 03/10/11 2110 03/10/11 1655 03/10/11 1143  GLUCAP 229* 147* 139* 158* 148*   Hgb A1c  Basename 03/09/11 0330  HGBA1C 7.2*    Studies/Results: No results found.  Medications: Scheduled Meds:   . enoxaparin (LOVENOX) injection  60 mg Subcutaneous Q24H  . insulin aspart  0-20 Units Subcutaneous TID WC  . insulin aspart  0-5 Units Subcutaneous QHS  . DISCONTD: enoxaparin  40 mg Subcutaneous Q24H   Continuous Infusions:   . sodium chloride 100 mL/hr at 03/10/11 2122   PRN Meds:.acetaminophen, acetaminophen, alum & mag hydroxide-simeth, oxyCODONE-acetaminophen, promethazine, zolpidem, DISCONTD: HYDROmorphone, DISCONTD: promethazine Antibiotics: Anti-infectives    None       Assessment/Plan:  Principal Problem:  *Hemiplegic migraine Patient was admitted and put on supportive care. PT and OT consultations have been requested. If her physical symptoms do not improve over the next 24 hours, would recommend transitioning to a skilled nursing facility for ongoing physical rehabilitation. Active Problems:  Conversion disorder Patient has had multiple psychiatry evaluation for the past and no specific therapy has proven successful in alleviating her hemiparesis.  Diabetes type 2, uncontrolled Patient's hemoglobin A1c of 7.2 indicates fair control of her diabetes. Her CBGs have ranged from 139-229 here in the hospital. She is currently on sliding scale insulin, the insulin resistant scale, with no basal insulin  or meal coverage. Will add 10 units of Lantus each bedtime.  Abdominal pain Patient's abdominal pain has resolved.  Diarrhea Patient continues to complain of diarrhea. We'll order stool studies but doubt this is anything significant.    LOS: 3 days   Angelica Aldo, MD Pager [phone]  03/11/2011, 12:27 PM

## 2011-03-12 LAB — CBC
HCT: 37.9 % (ref 36.0–46.0)
Hemoglobin: 12.3 g/dL (ref 12.0–15.0)
MCH: 26.3 pg (ref 26.0–34.0)
MCHC: 32.5 g/dL (ref 30.0–36.0)
MCV: 81 fL (ref 78.0–100.0)

## 2011-03-12 LAB — BASIC METABOLIC PANEL
BUN: 8 mg/dL (ref 6–23)
Chloride: 104 mEq/L (ref 96–112)
GFR calc non Af Amer: >90 mL/min (ref >90)
Glucose, Bld: 136 mg/dL — ABNORMAL HIGH (ref 70–99)
Potassium: 3.6 mEq/L (ref 3.5–5.1)

## 2011-03-12 LAB — GLUCOSE, CAPILLARY: Glucose-Capillary: 170 mg/dL — ABNORMAL HIGH (ref 70–99)

## 2011-03-12 MED ORDER — CYCLOBENZAPRINE HCL 10 MG PO TABS: 10.0000 mg | ORAL_TABLET | Freq: Two times a day (BID) | ORAL | Status: DC | PRN

## 2011-03-12 MED ORDER — TOPIRAMATE 100 MG PO TABS: 100.0000 mg | ORAL_TABLET | Freq: Two times a day (BID) | ORAL | Status: DC

## 2011-03-12 NOTE — Progress Notes (Signed)
UR COMPLETED  

## 2011-03-12 NOTE — Progress Notes (Signed)
Spoke with pt concerning discharge. Pt selected [facility] for HHPT.Refferal given to [facility] in house rep. Venia Minks, RN. mp

## 2011-03-12 NOTE — Progress Notes (Signed)
Physical Therapy Evaluation Patient Details Name: [name] MRN: [id number] DOB: 1976/03/14 Today's Date: 03/12/2011 Time: 839-857 Charge: EVII  Problem List:  Patient Active Problem List  Diagnoses  . Hemiplegic migraine  . Conversion disorder  . Diabetes type 2, uncontrolled  . Abdominal pain  . Diarrhea    Past Medical History:  Past Medical History  Diagnosis Date  . Depression   . Anxiety   . Diabetes mellitus   . GERD (gastroesophageal reflux disease)   . Migraine    Past Surgical History: History reviewed. No pertinent past surgical history.  PT Assessment/Plan/Recommendation PT Assessment Clinical Impression Statement: Pt presents with L LE weakness and decreased ability to advance L LE without physical assist during gait.  Pt reports she believes this will improve significantly tomorrow (as she has ongoing hx of L LE hemiparesis).  Pt would benefit from acute PT services in order to improve gait pattern and increase L LE strength to improve mobility for safe d/c home with spouse and children.  Recommended w/c to assist with safe mobility while L LE weak but pt reports her house is not appropriate for w/c navigation. PT Recommendation/Assessment: Patient will need skilled PT in the acute care venue PT Problem List: Decreased strength;Decreased mobility;Pain Barriers to Discharge Comments: psych hx with recurrent L sided hemiparesis/hemiplegia with unknown cause PT Therapy Diagnosis : Difficulty walking PT Plan PT Frequency: Min 3X/week PT Treatment/Interventions: DME instruction;Gait training;Stair training;Functional mobility training;Therapeutic activities;Therapeutic exercise;Patient/family education PT Recommendation Recommendations for Other Services:  (seen with OT) Follow Up Recommendations: Home health PT Equipment Recommended: None recommended by PT PT Goals  Acute Rehab PT Goals PT Goal Formulation: With patient Time For Goal Achievement: 7 days Pt  will go Supine/Side to Sit: with modified independence PT Goal: Supine/Side to Sit - Progress: Not met Pt will go Sit to Stand: with modified independence PT Goal: Sit to Stand - Progress: Not met Pt will Ambulate: >150 feet;with least restrictive assistive device;with modified independence PT Goal: Ambulate - Progress: Not met Pt will Go Up / Down Stairs: Flight;with rail(s);with supervision PT Goal: Up/Down Stairs - Progress: Not met  PT Evaluation Precautions/Restrictions  Precautions Precautions: Fall Prior Functioning  Home Living Lives With: Spouse Type of Home: Apartment Home Layout: One level Home Access: Stairs to enter Entrance Stairs-Rails: Can reach both Entrance Stairs-Number of Steps: full flight Bathroom Shower/Tub: Tub/shower unit;Curtain Firefighter: Standard Home Adaptive Equipment: Walker - rolling Prior Function Level of Independence: Independent with basic ADLs;Independent with gait;Independent with transfers Cognition Cognition Arousal/Alertness: Awake/alert Overall Cognitive Status: Appears within functional limits for tasks assessed Orientation Level: Oriented X4 Sensation/Coordination   Extremity Assessment RLE Assessment RLE Assessment: Within Functional Limits LLE Strength LLE Overall Strength Comments: 1/5 with MMT throughout Mobility (including Balance) Bed Mobility Bed Mobility: No Transfers Transfers: Yes Sit to Stand: 4: Min assist;From chair/3-in-1;With upper extremity assist Sit to Stand Details (indicate cue type and reason): min/guard, verbal cue for forward lean Stand to Sit: 4: Min assist;With upper extremity assist;To chair/3-in-1 Stand to Sit Details: min/guard Ambulation/Gait Ambulation/Gait: Yes Ambulation/Gait Assistance: 4: Min assist Ambulation/Gait Assistance Details (indicate cue type and reason): pt able to push RW with Bilateral UEs but reports problem advancing L LE.  pt had L LE propped up on bed while sitting on  BSC upon entering and controlled lowering of L LE onto floor to prepare for standing.  PT manually assist L LE with advancement and no other physical assist required. Ambulation Distance (Feet): 14 Feet Assistive  device: Rolling walker Gait Pattern: Step-to pattern;Decreased hip/knee flexion - left;Decreased weight shift to left;Decreased stance time - left    Exercise    End of Session PT - End of Session Activity Tolerance: Patient tolerated treatment well Patient left: in chair;with call bell in reach General Behavior During Session: [facility] for tasks performed Cognition: [facility] for tasks performed  Lauryl Seyer,KATHrine E 03/12/2011, 10:28 AM Pager: [phone]

## 2011-03-12 NOTE — Discharge Summary (Signed)
Physician Discharge Summary  Patient ID: [name] MRN: [id number] DOB/AGE: 12/18/1975 35 y.o.  Admit date: 03/08/2011 Discharge date: 03/12/2011  Primary Care Physician:  Berenda Morale, MD   Discharge Diagnoses:   1-hemiplegic migraine 2-diabetes type 2 (hemoglobin A1c 7.2) 3-hyperlipidemia 4-depression/anxiety 5-insomnia 6-chronic back pain secondary to herniated disc 7-abdominal pain and diarrhea secondary to viral gastroenteritis.   Present on Admission:  .Hemiplegic migraine .Diabetes type 2, uncontrolled .Abdominal pain .Diarrhea  Current Discharge Medication List    CONTINUE these medications which have CHANGED   Details  cyclobenzaprine (FLEXERIL) 10 MG tablet Take 1 tablet (10 mg total) by mouth 2 (two) times daily as needed. Headaches Qty: 30 tablet    topiramate (TOPAMAX) 100 MG tablet Take 1 tablet (100 mg total) by mouth 2 (two) times daily. Qty: 60 tablet, Refills: 1      CONTINUE these medications which have NOT CHANGED   Details  ALPRAZolam (XANAX) 1 MG tablet Take 1 mg by mouth daily as needed. anxiety    amphetamine-dextroamphetamine (ADDERALL) 30 MG tablet Take 30 mg by mouth daily as needed. For concentration    ARIPiprazole (ABILIFY) 5 MG tablet Take 5 mg by mouth at bedtime.     chlorproMAZINE (THORAZINE) 25 MG tablet Take 25 mg by mouth daily as needed. headaches    desvenlafaxine (PRISTIQ) 50 MG 24 hr tablet Take 50 mg by mouth daily.      fenofibrate micronized (LOFIBRA) 200 MG capsule Take 200 mg by mouth every evening.     HYDROmorphone (DILAUDID) 4 MG tablet Take 4 mg by mouth 2 (two) times daily as needed. Herniated disk     insulin aspart (NOVOLOG) 100 UNIT/ML injection Inject 6-10 Units into the skin 3 (three) times daily before meals. 6units breakfast 8units lunch 10 units dinnertime.    insulin glargine (LANTUS) 100 UNIT/ML injection Inject 30 Units into the skin at bedtime.     Liraglutide (VICTOZA) 18 MG/3ML SOLN Inject 1.8  mLs into the skin daily after lunch daily after lunch.      metFORMIN (GLUMETZA) 1000 MG (MOD) 24 hr tablet Take 1,000 mg by mouth every evening.     pravastatin (PRAVACHOL) 40 MG tablet Take 40 mg by mouth daily.      Eszopiclone 3 MG TABS Take 3 mg by mouth at bedtime. Take immediately before bedtime ; hasnt started medication yet.          Disposition and Follow-up: Patient discharged in a stable and improved condition, quarterly STO with some left first remedy weakness and also left lower strandy weakness. No abnormalities have seen on CT scan and per physical therapy/occupational therapy assessment, patient will benefit of home health PT. No having any headaches, no chest pain or shortness of breath. Patient Topamax has been increase to 100 mg twice a day in order to help prevent in further migraine episodes and she will continue following with her headache especially and also with her psychiatrist. Further adjustment on her medications for cholesterol and diabetes, to be done by PCP during her followup appointment.  Consults:   None   Significant Diagnostic Studies:  Ct Head Wo Contrast  03/09/2011  *RADIOLOGY REPORT*  Clinical Data: Migraine headaches, left hemiplegia  CT HEAD WITHOUT CONTRAST  Technique:  Contiguous axial images were obtained from the base of the skull through the vertex without contrast.  Comparison: 12/19/2010  Findings: No evidence of parenchymal hemorrhage or extra-axial fluid collection. No mass lesion, mass effect, or midline shift.  No CT evidence  of acute infarction.  Cerebral volume is age appropriate.  No ventriculomegaly.  The visualized paranasal sinuses are essentially clear. The mastoid air cells are unopacified.  No evidence of calvarial fracture.  IMPRESSION: Normal head CT.  No interval change from multiple prior studies.  Original Report Authenticated By: Charline Bills, M.D.    Brief H and P: [name] is an 35 y.o. female with a history of  Migraine headaches, who presents with complaints of severe headache and left sided weakness, with difficulty speaking x 1 day. She reports having Right side headache described as sharp and throbbing rated as 8/10 in severity. She reports having nausea phototphobia and neck pain, she denies fever and neck stiffness. She has been hospitalized 8 times in the past for similar symptoms which usually resolve in 3 days depending on the severity. She has had numerous CT scan of the Head and MRIs of the Brain with negative pathological results.    Hospital Course:  1-Hemiplegic migraine: Patient hemiplegia secondary to complicated migraine; a CT scan of the head was negative to demonstrate any acute intracranial abnormalities. At this point physical therapy/occupational therapy have evaluated the patient and recommended home health PT in order to continue improving with the patient's left hemiplegia. Patient Topamax has been increased to 100 mg twice a day and she will continue taking the rest of her medications as prior to admission prescribed by PCP. She will follow with her head space at least and also with her psychiatry as an outpatient for further adjustment of her medication regimen.  2-Depression/anxiety with hx of conversion disorder: Continue antidepressant and anxiolytic regimen. Further adjustment of her medication regimen to be done as an outpatient  By her psychiatry.  3-Diabetes type 2, uncontrolled: Patient advised to follow a low carbohydrate diet and to continue the use of Lantus, sliding scale insulin, victoza and Glumetza. A1c 7.2 during this admission.  4-Abdominal pain: Etiology unclear most likely secondary to viral gastroenteritis. Currently no having any further abdominal pain, diarrhea, nausea or vomiting. Patient tolerating full diet without any difficulties.  5-hyperlipidemia: Continue statins.  6-chronic pain secondary to herniated disc: Continue PRN dilaudid as previously  prescribed.  7-insomnia: Continue PRN eszopiclone.  Time spent on Discharge: 50 minutes  Signed: Jerett Odonohue 03/12/2011, 1:12 PM

## 2011-03-12 NOTE — Progress Notes (Signed)
Pt discharged to home via wheelchair with family. Discharge instructions reviewed with pt who verbalized understanding, new RX given to Pt. Pt's skin WDI at discharge.

## 2011-03-12 NOTE — Progress Notes (Signed)
Occupational Therapy Evaluation Patient Details Name: Raneshia Derick MRN: [id number] DOB: Sep 16, 1975 Today's Date: 03/12/2011 8:38-8:57am Ev II & sign off Acute OT Problem List:  Patient Active Problem List  Diagnoses  . Hemiplegic migraine  . Conversion disorder  . Diabetes type 2, uncontrolled  . Abdominal pain  . Diarrhea    Past Medical History:  Past Medical History  Diagnosis Date  . Depression   . Anxiety   . Diabetes mellitus   . GERD (gastroesophageal reflux disease)   . Migraine    Past Surgical History: History reviewed. No pertinent past surgical history.  OT Assessment/Plan/Recommendation OT Assessment Clinical Impression Statement: Pt was noted/observed to use L UE during assessment for transfers (from [facility], w/ RW during ambulation and while reaching back for chair for controlled stand to sit & sit to stand form BSC). Pt otherwise used R UE (one handed tech's) for eating & tray set-up w/ Mod I. Do not recommend further acute OT at this time as pt appears Mod I w/ funct tasks. Will sign off at this time. OT Recommendation/Assessment: Patient does not need any further OT services OT Recommendation: Sign Off Acute OT Equipment Recommended: None recommended by OT     OT Evaluation Precautions/Restrictions  Precautions Precautions: Fall Restrictions Weight Bearing Restrictions: Yes Other Position/Activity Restrictions: activity as tolerated Prior Functioning Home Living Lives With: Spouse Type of Home: Apartment Home Layout: One level Home Access: Stairs to enter Entrance Stairs-Rails: Can reach both Entrance Stairs-Number of Steps: full flight Bathroom Shower/Tub: Tub/shower unit;Curtain Firefighter: Standard Home Adaptive Equipment: Walker - rolling Prior Function Level of Independence: Independent with basic ADLs;Independent with gait;Independent with transfers ADL   Vision/Perception  Vision - History Baseline Vision: Wears glasses only for  reading Patient Visual Report: No change from baseline Vision - Assessment Eye Alignment: Within Functional Limits Cognition Cognition Arousal/Alertness: Awake/alert Overall Cognitive Status: Appears within functional limits for tasks assessed Orientation Level: Oriented X4 Sensation/Coordination Sensation Light Touch: Appears Intact Coordination Gross Motor Movements are Fluid and Coordinated: Not tested (Pt using L UE for funct tasks, transfers & hold RW noted) Fine Motor Movements are Fluid and Coordinated: Not tested (Pt using L UE for funct tasks, transfers & hold RW noted) Extremity Assessment RUE Assessment RUE Assessment: Within Functional Limits LUE Assessment LUE Assessment: Not tested (Pt using L UE for funct tasks, transfers & hold RW noted) Mobility  Bed Mobility Bed Mobility: No Transfers Sit to Stand: 4: Min assist;From chair/3-in-1;With upper extremity assist Sit to Stand Details (indicate cue type and reason): min/guard, verbal cue for forward lean Stand to Sit: 4: Min assist;With upper extremity assist;To chair/3-in-1 Stand to Sit Details: min/guard Exercises Other Exercises Other Exercises: Pt observed using L UE for funct tasks, transfers & to hold RW  & bear weight during functional mobility noted. When eating, she primarily used R one handed tech's and was overall Mod I noted in this manner  End of Session OT - End of Session Equipment Utilized During Treatment: Other (comment) (RW; one handed tech's using R for > L for ADL's) Activity Tolerance: Patient tolerated treatment well Patient left: in chair;with call bell in reach General Behavior During Session: [facility] for tasks performed Cognition: [facility] for tasks performed   Roselie Awkward Dixon 03/12/2011, 10:47 AM

## 2011-04-02 ENCOUNTER — Ambulatory Visit: Payer: BC Managed Care – PPO | Attending: Family Medicine | Admitting: Physical Therapy

## 2011-04-02 DIAGNOSIS — R5381 Other malaise: Secondary | ICD-10-CM | POA: Insufficient documentation

## 2011-04-02 DIAGNOSIS — IMO0001 Reserved for inherently not codable concepts without codable children: Secondary | ICD-10-CM | POA: Insufficient documentation

## 2011-04-08 DIAGNOSIS — R030 Elevated blood-pressure reading, without diagnosis of hypertension: Secondary | ICD-10-CM | POA: Diagnosis not present

## 2011-04-08 DIAGNOSIS — E785 Hyperlipidemia, unspecified: Secondary | ICD-10-CM | POA: Diagnosis not present

## 2011-04-08 DIAGNOSIS — IMO0001 Reserved for inherently not codable concepts without codable children: Secondary | ICD-10-CM | POA: Diagnosis not present

## 2011-04-15 ENCOUNTER — Ambulatory Visit: Payer: BC Managed Care – PPO | Admitting: Physical Therapy

## 2011-04-18 NOTE — ED Provider Notes (Signed)
Medical screening examination/treatment/procedure(s) were performed by non-physician practitioner and as supervising physician I was immediately available for consultation/collaboration.   Suzi Roots, MD 04/18/11 1248

## 2011-04-22 ENCOUNTER — Ambulatory Visit: Payer: BC Managed Care – PPO | Admitting: Physical Therapy

## 2011-04-28 DIAGNOSIS — H6690 Otitis media, unspecified, unspecified ear: Secondary | ICD-10-CM | POA: Diagnosis not present

## 2011-05-04 ENCOUNTER — Ambulatory Visit: Payer: BC Managed Care – PPO | Attending: Family Medicine | Admitting: Physical Therapy

## 2011-05-04 DIAGNOSIS — IMO0001 Reserved for inherently not codable concepts without codable children: Secondary | ICD-10-CM | POA: Insufficient documentation

## 2011-05-04 DIAGNOSIS — R5381 Other malaise: Secondary | ICD-10-CM | POA: Insufficient documentation

## 2011-05-06 ENCOUNTER — Encounter (HOSPITAL_COMMUNITY): Payer: Self-pay | Admitting: Emergency Medicine

## 2011-05-06 ENCOUNTER — Inpatient Hospital Stay (HOSPITAL_COMMUNITY)
Admission: EM | Admit: 2011-05-06 | Discharge: 2011-05-09 | DRG: 024 | Disposition: A | Discharge status: Home Health | Payer: BC Managed Care – PPO | Attending: Internal Medicine | Admitting: Internal Medicine

## 2011-05-06 ENCOUNTER — Emergency Department (HOSPITAL_COMMUNITY): Payer: BC Managed Care – PPO

## 2011-05-06 DIAGNOSIS — F411 Generalized anxiety disorder: Secondary | ICD-10-CM | POA: Diagnosis present

## 2011-05-06 DIAGNOSIS — F329 Major depressive disorder, single episode, unspecified: Secondary | ICD-10-CM | POA: Diagnosis not present

## 2011-05-06 DIAGNOSIS — E1165 Type 2 diabetes mellitus with hyperglycemia: Secondary | ICD-10-CM | POA: Diagnosis present

## 2011-05-06 DIAGNOSIS — F3289 Other specified depressive episodes: Secondary | ICD-10-CM | POA: Diagnosis present

## 2011-05-06 DIAGNOSIS — E118 Type 2 diabetes mellitus with unspecified complications: Secondary | ICD-10-CM | POA: Diagnosis not present

## 2011-05-06 DIAGNOSIS — D72829 Elevated white blood cell count, unspecified: Secondary | ICD-10-CM | POA: Diagnosis present

## 2011-05-06 DIAGNOSIS — R51 Headache: Secondary | ICD-10-CM | POA: Diagnosis not present

## 2011-05-06 DIAGNOSIS — G43109 Migraine with aura, not intractable, without status migrainosus: Secondary | ICD-10-CM | POA: Diagnosis not present

## 2011-05-06 DIAGNOSIS — IMO0002 Reserved for concepts with insufficient information to code with codable children: Secondary | ICD-10-CM | POA: Diagnosis not present

## 2011-05-06 DIAGNOSIS — Z794 Long term (current) use of insulin: Secondary | ICD-10-CM | POA: Diagnosis not present

## 2011-05-06 DIAGNOSIS — G43409 Hemiplegic migraine, not intractable, without status migrainosus: Secondary | ICD-10-CM | POA: Diagnosis present

## 2011-05-06 DIAGNOSIS — IMO0001 Reserved for inherently not codable concepts without codable children: Secondary | ICD-10-CM | POA: Diagnosis present

## 2011-05-06 DIAGNOSIS — E785 Hyperlipidemia, unspecified: Secondary | ICD-10-CM | POA: Diagnosis present

## 2011-05-06 DIAGNOSIS — D7289 Other specified disorders of white blood cells: Secondary | ICD-10-CM | POA: Diagnosis not present

## 2011-05-06 DIAGNOSIS — G43809 Other migraine, not intractable, without status migrainosus: Secondary | ICD-10-CM | POA: Diagnosis not present

## 2011-05-06 HISTORY — DX: Other specified behavioral and emotional disorders with onset usually occurring in childhood and adolescence: F98.8

## 2011-05-06 HISTORY — DX: Reserved for concepts with insufficient information to code with codable children: IMO0002

## 2011-05-06 LAB — BASIC METABOLIC PANEL
CO2: 24 mEq/L (ref 19–32)
Chloride: 99 mEq/L (ref 96–112)
Creatinine, Ser: 0.56 mg/dL (ref 0.50–1.10)
Potassium: 4.1 mEq/L (ref 3.5–5.1)
Sodium: 138 mEq/L (ref 135–145)

## 2011-05-06 LAB — CBC
HCT: 42.9 % (ref 36.0–46.0)
Hemoglobin: 14.1 g/dL (ref 12.0–15.0)
MCH: 26.2 pg (ref 26.0–34.0)
MCHC: 32.9 g/dL (ref 30.0–36.0)
MCV: 79.6 fL (ref 78.0–100.0)
Platelets: 255 K/uL (ref 150–400)
RBC: 5.39 MIL/uL — ABNORMAL HIGH (ref 3.87–5.11)
RDW: 13.5 % (ref 11.5–15.5)
WBC: 10.9 K/uL — ABNORMAL HIGH (ref 4.0–10.5)

## 2011-05-06 LAB — URINALYSIS, ROUTINE W REFLEX MICROSCOPIC
Bilirubin Urine: NEGATIVE
Hgb urine dipstick: NEGATIVE
Ketones, ur: NEGATIVE mg/dL
Protein, ur: NEGATIVE mg/dL
Urobilinogen, UA: 0.2 mg/dL (ref 0.0–1.0)

## 2011-05-06 LAB — RAPID URINE DRUG SCREEN, HOSP PERFORMED
Amphetamines: NOT DETECTED
Benzodiazepines: NOT DETECTED
Cocaine: NOT DETECTED
Opiates: NOT DETECTED
Tetrahydrocannabinol: NOT DETECTED

## 2011-05-06 LAB — GLUCOSE, CAPILLARY: Glucose-Capillary: 107 mg/dL — ABNORMAL HIGH (ref 70–99)

## 2011-05-06 MED ORDER — ARIPIPRAZOLE 5 MG PO TABS
5.0000 mg | ORAL_TABLET | Freq: Every day | ORAL | Status: DC
  Administered 2011-05-06 – 2011-05-08 (×3): 5 mg via ORAL
  Filled 2011-05-06 (×4): qty 1

## 2011-05-06 MED ORDER — INSULIN ASPART 100 UNIT/ML ~~LOC~~ SOLN
0.0000 [IU] | SUBCUTANEOUS | Status: DC
  Filled 2011-05-06: qty 3

## 2011-05-06 MED ORDER — HEPARIN SODIUM (PORCINE) 5000 UNIT/ML IJ SOLN
5000.0000 [IU] | Freq: Three times a day (TID) | INTRAMUSCULAR | Status: DC
  Administered 2011-05-06 – 2011-05-09 (×8): 5000 [IU] via SUBCUTANEOUS
  Filled 2011-05-06 (×11): qty 1

## 2011-05-06 MED ORDER — AMPHETAMINE-DEXTROAMPHETAMINE 10 MG PO TABS: 30.0000 mg | ORAL_TABLET | Freq: Every day | ORAL | Status: DC

## 2011-05-06 MED ORDER — PROMETHAZINE HCL 25 MG PO TABS
12.5000 mg | ORAL_TABLET | Freq: Four times a day (QID) | ORAL | Status: DC | PRN
  Administered 2011-05-08 (×2): 12.5 mg via ORAL
  Filled 2011-05-06 (×2): qty 1

## 2011-05-06 MED ORDER — INSULIN GLARGINE 100 UNIT/ML ~~LOC~~ SOLN
15.0000 [IU] | Freq: Every day | SUBCUTANEOUS | Status: DC
  Administered 2011-05-06 – 2011-05-08 (×3): 15 [IU] via SUBCUTANEOUS
  Filled 2011-05-06: qty 3

## 2011-05-06 MED ORDER — HYDROMORPHONE HCL PF 1 MG/ML IJ SOLN
2.0000 mg | INTRAMUSCULAR | Status: DC | PRN
  Administered 2011-05-06 – 2011-05-08 (×9): 2 mg via INTRAVENOUS
  Filled 2011-05-06 (×10): qty 2

## 2011-05-06 MED ORDER — AMPHETAMINE-DEXTROAMPHETAMINE 20 MG PO TABS
30.0000 mg | ORAL_TABLET | Freq: Every day | ORAL | Status: DC
  Filled 2011-05-06: qty 2
  Filled 2011-05-06: qty 3

## 2011-05-06 MED ORDER — PROMETHAZINE HCL 25 MG/ML IJ SOLN
12.5000 mg | Freq: Four times a day (QID) | INTRAMUSCULAR | Status: DC | PRN
  Administered 2011-05-06 – 2011-05-08 (×5): 12.5 mg via INTRAVENOUS
  Filled 2011-05-06 (×5): qty 1

## 2011-05-06 MED ORDER — INSULIN ASPART 100 UNIT/ML ~~LOC~~ SOLN
0.0000 [IU] | Freq: Three times a day (TID) | SUBCUTANEOUS | Status: DC
  Administered 2011-05-07 (×2): 2 [IU] via SUBCUTANEOUS
  Administered 2011-05-07: 1 [IU] via SUBCUTANEOUS
  Administered 2011-05-08: 3 [IU] via SUBCUTANEOUS
  Administered 2011-05-08: 1 [IU] via SUBCUTANEOUS
  Administered 2011-05-08: 2 [IU] via SUBCUTANEOUS
  Administered 2011-05-08 – 2011-05-09 (×2): 1 [IU] via SUBCUTANEOUS

## 2011-05-06 MED ORDER — PROMETHAZINE HCL 25 MG/ML IJ SOLN
25.0000 mg | Freq: Once | INTRAMUSCULAR | Status: AC
  Administered 2011-05-06: 25 mg via INTRAVENOUS
  Filled 2011-05-06 (×2): qty 1

## 2011-05-06 MED ORDER — ALPRAZOLAM 1 MG PO TABS: 1.0000 mg | ORAL_TABLET | Freq: Every day | ORAL | Status: DC | PRN

## 2011-05-06 MED ORDER — SODIUM CHLORIDE 0.9 % IV BOLUS (SEPSIS)
1000.0000 mL | Freq: Once | INTRAVENOUS | Status: AC
  Administered 2011-05-06: 1000 mL via INTRAVENOUS

## 2011-05-06 MED ORDER — VENLAFAXINE HCL ER 37.5 MG PO CP24
37.5000 mg | ORAL_CAPSULE | Freq: Every day | ORAL | Status: DC
  Administered 2011-05-07 – 2011-05-09 (×3): 37.5 mg via ORAL
  Filled 2011-05-06 (×3): qty 1

## 2011-05-06 MED ORDER — VERAPAMIL HCL 40 MG PO TABS
40.0000 mg | ORAL_TABLET | Freq: Two times a day (BID) | ORAL | Status: DC
  Administered 2011-05-08 – 2011-05-09 (×2): 40 mg via ORAL
  Filled 2011-05-06 (×9): qty 1

## 2011-05-06 MED ORDER — CHLORPROMAZINE HCL 50 MG PO TABS
50.0000 mg | ORAL_TABLET | Freq: Three times a day (TID) | ORAL | Status: DC | PRN
  Administered 2011-05-06 – 2011-05-08 (×2): 50 mg via ORAL
  Filled 2011-05-06 (×2): qty 1

## 2011-05-06 MED ORDER — HYDROMORPHONE HCL PF 2 MG/ML IJ SOLN
2.0000 mg | Freq: Once | INTRAMUSCULAR | Status: AC
  Administered 2011-05-06: 2 mg via INTRAVENOUS
  Filled 2011-05-06: qty 1

## 2011-05-06 NOTE — ED Notes (Signed)
Pt report migraine began yesterday afternoon, this am, pt reports migraine worse accompanied with neck pain, left side numbness and tingling beginning in the face down to toes and affected speech. Pt has left sided facial droop, with asymetrical smile and tongue deviation.  Pt reports similar migraines in past with affected speech, and numbness but no neck pain. Pt has nausea but no vomiting. Pt reports she has history of hemiplegic migraine.

## 2011-05-06 NOTE — ED Notes (Signed)
Patient transported to CT 

## 2011-05-06 NOTE — ED Notes (Signed)
Unsuccessful attempts at IV start x2

## 2011-05-06 NOTE — ED Provider Notes (Signed)
History     CSN: [id number]  Arrival date & time 05/06/11  1009   First MD Initiated Contact with Patient 05/06/11 1016      Chief Complaint  Patient presents with  . Migraine     The history is provided by the patient, the spouse and medical records.  The patient is a 36 y/o F with a hx of hemiplegic migraines, questionable conversion disorder, anxiety, depression, and DM who presents to ED with c/o typical migraine that is assoc with left sided hemiplegia. Multiple prior evals in ED for same with intermittent hospital admission, neurology consults. Multiple CT scans and MRIs with no evidence of stroke. Current episode began with mild HA yesterday, progressed to mild left-sided weakness and numbness present upon waking today, then to full left-sided hemiplegia with left-sided facial droop and slurred speech which prompted her visit to the ED. Pt speaks in short bursts, describes a severe HA with photophobia, nausea, and bilateral neck pain. There has been no recent injury. Denies fever/chills or vomiting. Pt took her migraine rescue medication this morning without change in symptoms.  Past Medical History  Diagnosis Date  . Depression   . Anxiety   . Diabetes mellitus   . GERD (gastroesophageal reflux disease)   . Migraine   . ADD (attention deficit disorder)   . Hyperlipemia   . Herniated disc     Past Surgical History  Procedure Date  . Cholecystectomy   . Cesarean section   . Neuropathy     History reviewed. No pertinent family history.  History  Substance Use Topics  . Smoking status: Never Smoker   . Smokeless tobacco: Not on file  . Alcohol Use: Yes     Review of Systems 10 systems reviewed and are negative for acute change except as noted in the HPI.  Allergies  Atarax; Cephalosporins; Codeine; Compazine; Decadron; Demerol; Morphine and related; Penicillins; Reglan; Sulfa antibiotics; and Zofran  Home Medications   Current Outpatient Rx  Name Route Sig  Dispense Refill  . ALPRAZOLAM 1 MG PO TABS Oral Take 1 mg by mouth daily as needed. anxiety    . AMPHETAMINE-DEXTROAMPHETAMINE 30 MG PO TABS Oral Take 30 mg by mouth daily as needed. For concentration    . ARIPIPRAZOLE 5 MG PO TABS Oral Take 5 mg by mouth at bedtime.     . ATORVASTATIN CALCIUM 20 MG PO TABS Oral Take 20 mg by mouth daily.    . CHLORPROMAZINE HCL 25 MG PO TABS Oral Take 25 mg by mouth daily as needed. headaches    . CYCLOBENZAPRINE HCL 10 MG PO TABS Oral Take 1 tablet (10 mg total) by mouth 2 (two) times daily as needed. Headaches 30 tablet   . DESVENLAFAXINE SUCCINATE ER 50 MG PO TB24 Oral Take 50 mg by mouth daily.      . FENOFIBRATE MICRONIZED 200 MG PO CAPS Oral Take 200 mg by mouth every evening.     . INSULIN ASPART 100 UNIT/ML [place] SOLN Subcutaneous Inject 6-10 Units into the skin 3 (three) times daily before meals. 6units breakfast 8units lunch 10 units dinnertime.    . INSULIN GLARGINE 100 UNIT/ML [place] SOLN Subcutaneous Inject 30 Units into the skin at bedtime.     Marland Kitchen LIRAGLUTIDE 18 MG/3ML [place] SOLN Subcutaneous Inject 1.8 mLs into the skin daily after lunch daily after lunch.      . METFORMIN HCL ER (MOD) 1000 MG PO TB24 Oral Take 1,000 mg by mouth every evening.     Marland Kitchen  PROMETHAZINE HCL 25 MG PO TABS Oral Take 25 mg by mouth every 6 (six) hours as needed. For nausea    . TOPIRAMATE 100 MG PO TABS Oral Take 1 tablet (100 mg total) by mouth 2 (two) times daily. 60 tablet 1  . UREA 10 % EX CREA Topical Apply 1 application topically at bedtime.      BP 115/64  Pulse 84  Temp 97.6 F (36.4 C)  Resp 16  Ht 5\' 7"  (1.702 m)  Wt 289 lb (131.09 kg)  BMI 45.26 kg/m2  SpO2 97%  Physical Exam  Nursing note and vitals reviewed. Constitutional: She is oriented to person, place, and time. She appears well-developed and well-nourished. No distress.  HENT:  Head: Normocephalic and atraumatic.  Right Ear: External ear normal.  Left Ear: External ear normal.  Mouth/Throat: Oropharynx  is clear and moist.  Eyes: Conjunctivae and EOM are normal. Pupils are equal, round, and reactive to light. Right eye exhibits no discharge. Left eye exhibits no discharge.  Neck: Normal range of motion. Neck supple.  Cardiovascular: Normal rate, regular rhythm, normal heart sounds and intact distal pulses.   Pulmonary/Chest: Effort normal and breath sounds normal. No respiratory distress. She has no wheezes. She exhibits no tenderness.  Abdominal: Soft. Bowel sounds are normal. She exhibits no distension. There is no tenderness. There is no rebound and no guarding.  Musculoskeletal: She exhibits no edema and no tenderness.  Neurological: She is alert and oriented to person, place, and time. GCS eye subscore is 4. GCS verbal subscore is 5. GCS motor subscore is 6.       CN testing shows lack of sensation to light touch on left side of face, left-sided facial droop with tongue midline. CN otherwise intact. Speech slow, intermittent slurring thought pt does not appear to struggle to come up with words. Left-sided hemiplegia with no reaction to painful stimuli.  Skin: Skin is warm and dry. No rash noted.  Psychiatric: She has a normal mood and affect.    ED Course  Procedures (including critical care time)  Labs Reviewed  CBC - Abnormal; Notable for the following:    WBC 10.9 (*)    RBC 5.39 (*)    All other components within normal limits  BASIC METABOLIC PANEL   Ct Head Wo Contrast  05/06/2011  *RADIOLOGY REPORT*  Clinical Data: Worsening migraine with neck pain and left sided numbness.  Left facial droop.  CT HEAD WITHOUT CONTRAST  Technique:  Contiguous axial images were obtained from the base of the skull through the vertex without contrast.  Comparison: 03/09/2011.  Findings: No evidence of acute infarct, acute hemorrhage, mass lesion, mass effect or hydrocephalus.  Visualized paranasal sinuses and mastoid air cells are clear.  IMPRESSION: Negative.  Original Report Authenticated By:  Reyes Ivan, M.D.     1. Complicated migraine       MDM  Pt with hx hemiplegic migraines, presents with symptoms of same. Symptoms present upon awakening, not a code stroke. Per patient and husband, her symptoms are often resolved after treatment with IV fluids, Dilaudid, Phenergan and 3-4 hours of observation. Although multiple CT scans and MRIs have been performed on this patient in the past, cannot rule out acute stroke based on presentation and a CT scan is ordered.    3:00 PM Pt still with HA, hemiplegia. Negative CT scan. Neurology not consulted on this pt given hx conversion disorder and hemiplegic migraines. Plan for admission to hospital as pt  unable/unwilling to move left-sided extremities.     Shaaron Adler, [place] 05/06/11 1645

## 2011-05-06 NOTE — ED Provider Notes (Signed)
Medical screening examination/treatment/procedure(s) were conducted as a shared visit with non-physician practitioner(s) and myself.  I personally evaluated the patient during the encounter   Cyndra Numbers, MD 05/06/11 [phone]

## 2011-05-06 NOTE — H&P (Signed)
Hospital Admission Note Date: 05/06/2011  PCP: Berenda Morale, MD, MD  Chief Complaint: Left-sided hemiplegia  History of Present Illness: This is a 36 year old female with past medical history of anxiety, attention deficit disorder , migraine (hemiplegic migraines) admitted on 03/08/2011 for complicated migraines.  Comes in again with headache slurred speech and left-sided hemiplegia that started this morning. Last time she was seen minimal was a day prior to admission. She relates she woke up with kind of a weakness on her left side which progressively got worse to the point where she had dysarthria. She relates she has been taking her medications at home, she relates it happened in the past. She relates difficulty swallowing. C. she denies any dysuria, no cough, also contacts, no fever, no falls or trauma.  Allergies: Atarax; Cephalosporins; Codeine; Compazine; Decadron; Demerol; Morphine and related; Penicillins; Reglan; Sulfa antibiotics; and Zofran Past Medical History  Diagnosis Date  . Depression   . Anxiety   . Diabetes mellitus   . GERD (gastroesophageal reflux disease)   . Migraine   . ADD (attention deficit disorder)   . Hyperlipemia   . Herniated disc    Prior to Admission medications   Medication Sig Start Date End Date Taking? Authorizing Provider  ALPRAZolam Prudy Feeler) 1 MG tablet Take 1 mg by mouth daily as needed. anxiety   Yes Historical Provider, MD  amphetamine-dextroamphetamine (ADDERALL) 30 MG tablet Take 30 mg by mouth daily as needed. For concentration   Yes Historical Provider, MD  ARIPiprazole (ABILIFY) 5 MG tablet Take 5 mg by mouth at bedtime.    Yes Historical Provider, MD  atorvastatin (LIPITOR) 20 MG tablet Take 20 mg by mouth daily.   Yes Historical Provider, MD  chlorproMAZINE (THORAZINE) 25 MG tablet Take 25 mg by mouth daily as needed. headaches   Yes Historical Provider, MD  cyclobenzaprine (FLEXERIL) 10 MG tablet Take 1 tablet (10 mg total) by mouth 2  (two) times daily as needed. Headaches 03/12/11  Yes Vassie Loll, MD  desvenlafaxine (PRISTIQ) 50 MG 24 hr tablet Take 50 mg by mouth daily.     Yes Historical Provider, MD  fenofibrate micronized (LOFIBRA) 200 MG capsule Take 200 mg by mouth every evening.    Yes Historical Provider, MD  insulin aspart (NOVOLOG) 100 UNIT/ML injection Inject 6-10 Units into the skin 3 (three) times daily before meals. 6units breakfast 8units lunch 10 units dinnertime.   Yes Historical Provider, MD  insulin glargine (LANTUS) 100 UNIT/ML injection Inject 30 Units into the skin at bedtime.    Yes Historical Provider, MD  Liraglutide (VICTOZA) 18 MG/3ML SOLN Inject 1.8 mLs into the skin daily after lunch daily after lunch.     Yes Historical Provider, MD  metFORMIN (GLUMETZA) 1000 MG (MOD) 24 hr tablet Take 1,000 mg by mouth every evening.    Yes Historical Provider, MD  promethazine (PHENERGAN) 25 MG tablet Take 25 mg by mouth every 6 (six) hours as needed. For nausea   Yes Historical Provider, MD  topiramate (TOPAMAX) 100 MG tablet Take 1 tablet (100 mg total) by mouth 2 (two) times daily. 03/12/11  Yes Vassie Loll, MD  urea (CARMOL) 10 % cream Apply 1 application topically at bedtime.   Yes Historical Provider, MD   Past Surgical History  Procedure Date  . Cholecystectomy   . Cesarean section   . Neuropathy    History reviewed. No pertinent family history. History   Social History  . Marital Status: Married    Spouse Name: N/A  Number of Children: N/A  . Years of Education: N/A   Occupational History  . Not on file.   Social History Main Topics  . Smoking status: Never Smoker   . Smokeless tobacco: Never Used  . Alcohol Use: Yes  . Drug Use: No  . Sexually Active: No   Other Topics Concern  . Not on file   Social History Narrative  . No narrative on file    REVIEW OF SYSTEMS:  Constitutional:  No weight loss, night sweats, Fevers, chills, fatigue.  HEENT:  Tooth/dental  problems,Sore throat,  No sneezing, itching, ear ache, nasal congestion, post nasal drip,  Cardio-vascular:  No chest pain, Orthopnea, PND, swelling in lower extremities, anasarca, dizziness, palpitations  GI:  No heartburn, indigestion, abdominal pain, nausea, vomiting, diarrhea, change in bowel habits, loss of appetite  Resp:  No shortness of breath with exertion or at rest. No excess mucus, no productive cough, No non-productive cough, No coughing up of blood.No change in color of mucus.No wheezing.No chest wall deformity  Skin:  no rash or lesions.  GU:  no dysuria, change in color of urine, no urgency or frequency. No flank pain.  Musculoskeletal:  No joint pain or swelling. No decreased range of motion. No back pain.  Psych:  No change in mood or affect. No depression or anxiety. No memory loss.   Physical Exam: Filed Vitals:   05/06/11 1034 05/06/11 1523  BP: 115/64 112/65  Pulse: 84 97  Temp: 97.6 F (36.4 C) 98.7 F (37.1 C)  TempSrc:  Oral  Resp: 16 12  Height: 5\' 7"  (1.702 m)   Weight: 131.09 kg (289 lb)   SpO2: 97% 100%    Intake/Output Summary (Last 24 hours) at 05/06/11 1656 Last data filed at 05/06/11 1213  Gross per 24 hour  Intake      0 ml  Output    550 ml  Net   -550 ml   BP 112/65  Pulse 97  Temp(Src) 98.7 F (37.1 C) (Oral)  Resp 12  Ht 5\' 7"  (1.702 m)  Wt 131.09 kg (289 lb)  BMI 45.26 kg/m2  SpO2 100%  General Appearance:    Alert, cooperative, no distress, appears stated age  Head:    Normocephalic, without obvious abnormality, atraumatic  Eyes:    PERRL, conjunctiva/corneas clear, EOM's intact, fundi    benign, both eyes       Ears:    Normal TM's and external ear canals, both ears  Nose:   Nares normal, septum midline, mucosa normal, no drainage    or sinus tenderness  Throat:   Lips, mucosa, and tongue normal; teeth and gums normal  Neck:   Supple, symmetrical, trachea midline, no adenopathy;       thyroid:  No  enlargement/tenderness/nodules; no carotid   bruit or JVD. Tender on her trapezius area.  Back:     Symmetric, no curvature, ROM normal, no CVA tenderness  Lungs:     Clear to auscultation bilaterally, respirations unlabored  Chest wall:    No tenderness or deformity  Heart:    Regular rate and rhythm, S1 and S2 normal, no murmur, rub   or gallop  Abdomen:     Soft, non-tender, bowel sounds active all four quadrants,    no masses, no organomegaly  Genitalia:    Normal female without lesion, discharge or tenderness  Rectal:    Normal tone, normal prostate, no masses or tenderness;   guaiac negative stool  Extremities:  Extremities normal, atraumatic, no cyanosis or edema  Pulses:   2+ and symmetric all extremities  Skin:   Skin color, texture, turgor normal, no rashes or lesions  Lymph nodes:   Cervical, supraclavicular, and axillary nodes normal  Neurologic:   CN III-VI intact. She has slurred speech. With mild nasal labial fold depression. Left side 1/5 strength, right side 5/5.   Lab results:  [facility] 05/06/11 1249  NA 138  K 4.1  CL 99  CO2 24  GLUCOSE 101*  BUN 10  CREATININE 0.56  CALCIUM 10.1  MG --  PHOS --    Basename 05/06/11 1249  WBC 10.9*  NEUTROABS --  HGB 14.1  HCT 42.9  MCV 79.6  PLT 255     Imaging results:  Ct Head Wo Contrast  05/06/2011  *RADIOLOGY REPORT*  Clinical Data: Worsening migraine with neck pain and left sided numbness.  Left facial droop.  CT HEAD WITHOUT CONTRAST  Technique:  Contiguous axial images were obtained from the base of the skull through the vertex without contrast.  Comparison: 03/09/2011.  Findings: No evidence of acute infarct, acute hemorrhage, mass lesion, mass effect or hydrocephalus.  Visualized paranasal sinuses and mastoid air cells are clear.  IMPRESSION: Negative.  Original Report Authenticated By: Reyes Ivan, M.D.   Other results: EKG: Pending.   Patient Active Hospital Problem List: 1.Hemiplegic migraine  (03/10/2011) -will treat her home dose  Thorazine and add verapamil for preventive treatment. We'll stop her Topamax .CT scan of the head was done and showed no acute processes, relates this to happen in the past. We'll use Dilaudid when necessary for any breakthrough pain. Phenergan for nausea . We'll get physical therapy to see her.  UDS negative. U/A does not shows any sign of infections. Mild leukocytosis. Has remained afebrile. She relates no cough or diarhea.  2.Diabetes type 2, uncontrolled (03/10/2011) Will put her n.p.o. for now as patient cannot swallow. We'll start her on sliding scale insulin and continue half the dose of her home insulin. Stop her oral regimen.  3. Hyperlipidemia: Will resume home meds once patient is able to swallow.  4. Anxiety:  continue ativan  5.Depression: Continue home meds.  Code Status: Full Code Family Communication:   Marinda Elk M.D. [facility] [phone] 05/06/2011, 4:56 PM

## 2011-05-07 DIAGNOSIS — G43809 Other migraine, not intractable, without status migrainosus: Secondary | ICD-10-CM | POA: Diagnosis not present

## 2011-05-07 DIAGNOSIS — D7289 Other specified disorders of white blood cells: Secondary | ICD-10-CM | POA: Diagnosis not present

## 2011-05-07 DIAGNOSIS — F329 Major depressive disorder, single episode, unspecified: Secondary | ICD-10-CM | POA: Diagnosis not present

## 2011-05-07 DIAGNOSIS — E118 Type 2 diabetes mellitus with unspecified complications: Secondary | ICD-10-CM | POA: Diagnosis not present

## 2011-05-07 LAB — HEMOGLOBIN A1C
Hgb A1c MFr Bld: 7.9 % — ABNORMAL HIGH (ref <5.7)
Mean Plasma Glucose: 180 mg/dL — ABNORMAL HIGH (ref <117)

## 2011-05-07 LAB — GLUCOSE, CAPILLARY: Glucose-Capillary: 194 mg/dL — ABNORMAL HIGH (ref 70–99)

## 2011-05-07 MED ORDER — SODIUM CHLORIDE 0.9 % IJ SOLN
10.0000 mL | Freq: Two times a day (BID) | INTRAMUSCULAR | Status: DC
  Administered 2011-05-07 – 2011-05-09 (×4): 10 mL

## 2011-05-07 MED ORDER — SODIUM CHLORIDE 0.9 % IJ SOLN
10.0000 mL | INTRAMUSCULAR | Status: DC | PRN
Start: 2011-05-07 — End: 2011-05-09

## 2011-05-07 NOTE — Evaluation (Signed)
Physical Therapy Evaluation Patient Details Name: [name] MRN: [id number] DOB: 1975/07/23 Today's Date: 05/07/2011  Problem List:  Patient Active Problem List  Diagnoses  . Hemiplegic migraine  . Conversion disorder  . Diabetes type 2, uncontrolled  . Abdominal pain  . Diarrhea    Past Medical History:  Past Medical History  Diagnosis Date  . Depression   . Anxiety   . Diabetes mellitus   . GERD (gastroesophageal reflux disease)   . Migraine   . ADD (attention deficit disorder)   . Hyperlipemia   . Herniated disc    Past Surgical History:  Past Surgical History  Procedure Date  . Cholecystectomy   . Cesarean section   . Neuropathy     PT Assessment/Plan/Recommendation PT Assessment Clinical Impression Statement: pt presents with left hemiplegia associated with migraine.  [name] has had this problem before and it seems to resolving to the point that she is able to ambulate and discharge to home within a few days Recommend continued PT at HHPT to assuare functional independence PT Recommendation/Assessment: Patient will need skilled PT in the acute care venue PT Problem List: Decreased strength;Decreased activity tolerance;Decreased mobility Barriers to Discharge: Inaccessible home environment PT Therapy Diagnosis : Difficulty walking;Abnormality of gait;Generalized weakness PT Plan PT Frequency: Min 3X/week PT Treatment/Interventions: Gait training;Functional mobility training;Therapeutic activities PT Recommendation Follow Up Recommendations: Home health PT Equipment Recommended: None recommended by PT PT Goals  Acute Rehab PT Goals PT Goal Formulation: With patient Time For Goal Achievement: 2 weeks Pt will go Supine/Side to Sit: with supervision PT Goal: Supine/Side to Sit - Progress: Goal set today Pt will go Sit to Stand: with supervision PT Goal: Sit to Stand - Progress: Goal set today Pt will Ambulate: 51 - 150 feet;with supervision;with least  restrictive assistive device PT Goal: Ambulate - Progress: Goal set today  PT Evaluation Precautions/Restrictions  Precautions Precautions: Fall Precaution Comments: pt has had this same presentation of symptoms several times before Required Braces or Orthoses: No Restrictions Weight Bearing Restrictions: No Prior Functioning  Home Living Lives With: Family Receives Help From: Family Type of Home: Apartment Home Layout: Two level Home Access: Stairs to enter [employer] of Steps: 12 Prior Function Level of Independence: Independent with gait Able to Take Stairs?: Yes Cognition Cognition Arousal/Alertness: Awake/alert Overall Cognitive Status: Appears within functional limits for tasks assessed Orientation Level: Oriented X4 Sensation/Coordination Sensation Light Touch:  (pt reports it does not feel normal) Coordination Gross Motor Movements are Fluid and Coordinated: No Coordination and Movement Description: pt "drags" left arm and leg Extremity Assessment RUE Assessment RUE Assessment: Within Functional Limits LUE Assessment LUE Assessment: Exceptions to WFL LUE AROM (degrees) LUE Overall AROM Comments: PROM WFL pt not able to do AROM, but some assistance is percieved with AAROM RLE Assessment RLE Assessment: Within Functional Limits LLE Assessment LLE Assessment: Exceptions to WFL LLE AROM (degrees) LLE Overall AROM Comments: PROM WFL, pt is able to hold her leg up in midline in hip flexed, knee flexed, foot flat position. Mobility (including Balance) Bed Mobility Bed Mobility: Yes Rolling Right: 3: Mod assist Rolling Right Details (indicate cue type and reason): pt needs assist to bring left arm and leg across to midline and to EOB Transfers Transfers: Yes Sit to Stand: 4: Min assist Sit to Stand Details (indicate cue type and reason): pt needs assist to put left arm on RW , but she is able  to stand with  trunk in midline Stand to Sit: 4: Min  assist Stand to Sit Details: control to direct hips to chair Ambulation/Gait Ambulation/Gait: Yes Ambulation/Gait Assistance: 3: Mod assist Ambulation/Gait Assistance Details (indicate cue type and reason): pt does not move left leg It stays stationary and she pivots body over top of it to move to chair Ambulation Distance (Feet): 2 Feet Assistive device: Rolling walker Gait Pattern: Step-to pattern Gait velocity: slow Stairs: No Wheelchair Mobility Wheelchair Mobility: No  Posture/Postural Control Posture/Postural Control: Postural limitations Postural Limitations: pt obese with generalized deconditioning Balance Balance Assessed: No Exercise    End of Session PT - End of Session Equipment Utilized During Treatment: Gait belt Activity Tolerance: Patient limited by fatigue Patient left: in chair;with call bell in reach;with family/visitor present Nurse Communication: Mobility status for transfers General Behavior During Session: [facility] for tasks performed Cognition: [facility] for tasks performed  Donnetta Hail 05/07/2011, 2:27 PM

## 2011-05-07 NOTE — Progress Notes (Signed)
Subjective  Garbled speech  Left arm and left lower extremity weakness   Objective: Vital signs in last 24 hours: Filed Vitals:   05/06/11 1523 05/06/11 1920 05/07/11 0550 05/07/11 1404  BP: 112/65 117/69 113/68 145/95  Pulse: 97 102 95 94  Temp: 98.7 F (37.1 C) 98.5 F (36.9 C) 97.6 F (36.4 C) 97.9 F (36.6 C)  TempSrc: Oral Oral Oral Oral  Resp: 12 18 18 20   Height:  5\' 7"  (1.702 m)    Weight:  131.2 kg (289 lb 3.9 oz)    SpO2: 100% 93% 95% 95%    Intake/Output Summary (Last 24 hours) at 05/07/11 1912 Last data filed at 05/07/11 1242  Gross per 24 hour  Intake    240 ml  Output   1476 ml  Net  -1236 ml    Weight change:  Lungs:  Clear to auscultation bilaterally, respirations unlabored  Chest wall:  No tenderness or deformity  Heart:  Regular rate and rhythm, S1 and S2 normal, no murmur, rub or gallop  Abdomen: Soft, non-tender, bowel sounds active all four quadrants,  no masses, no organomegaly  Genitalia:  Normal female without lesion, discharge or tenderness  Rectal: Normal tone, normal prostate, no masses or tenderness;  guaiac negative stool  Extremities:  Extremities normal, atraumatic, no cyanosis or edema  Pulses:  2+ and symmetric all extremities  Skin:  Skin color, texture, turgor normal, no rashes or lesions  Lymph nodes:  Cervical, supraclavicular, and axillary nodes normal  Neurologic:  CN III-VI intact. She has slurred speech. With mild nasal labial fold depression. Left side 1/5 strength, right side 5/5.     Lab Results: Results for orders placed during the hospital encounter of 05/06/11 (from the past 24 hour(s))  GLUCOSE, CAPILLARY     Status: Abnormal   Collection Time   05/06/11  8:33 PM      Component Value Range   Glucose-Capillary 112 (*) 70 - 99 (mg/dL)  GLUCOSE, CAPILLARY     Status: Abnormal   Collection Time   05/07/11  7:40 AM      Component Value Range   Glucose-Capillary 112 (*) 70 - 99 (mg/dL)   Comment 1 Documented  in Chart     Comment 2 Notify RN    GLUCOSE, CAPILLARY     Status: Abnormal   Collection Time   05/07/11 11:53 AM      Component Value Range   Glucose-Capillary 139 (*) 70 - 99 (mg/dL)  GLUCOSE, CAPILLARY     Status: Abnormal   Collection Time   05/07/11  4:54 PM      Component Value Range   Glucose-Capillary 159 (*) 70 - 99 (mg/dL)   Comment 1 Notify RN       Micro: No results found for this or any previous visit (from the past 240 hour(s)).  Studies/Results: Ct Head Wo Contrast  05/06/2011  *RADIOLOGY REPORT*  Clinical Data: Worsening migraine with neck pain and left sided numbness.  Left facial droop.  CT HEAD WITHOUT CONTRAST  Technique:  Contiguous axial images were obtained from the base of the skull through the vertex without contrast.  Comparison: 03/09/2011.  Findings: No evidence of acute infarct, acute hemorrhage, mass lesion, mass effect or hydrocephalus.  Visualized paranasal sinuses and mastoid air cells are clear.  IMPRESSION: Negative.  Original Report Authenticated By: Reyes Ivan, M.D.    Medications:   Scheduled Meds:   . amphetamine-dextroamphetamine  30 mg Oral Daily  .  ARIPiprazole  5 mg Oral QHS  . heparin  5,000 Units Subcutaneous Q8H  . insulin aspart  0-9 Units Subcutaneous TID AC & HS  . insulin glargine  15 Units Subcutaneous QHS  . sodium chloride  10-40 mL Intracatheter Q12H  . venlafaxine  37.5 mg Oral Q breakfast  . verapamil  40 mg Oral Q12H  . DISCONTD: insulin aspart  0-9 Units Subcutaneous Q4H   Continuous Infusions:  PRN Meds:.ALPRAZolam, chlorproMAZINE, HYDROmorphone, promethazine, promethazine, sodium chloride   Assessment:  1.Hemiplegic migraine (03/10/2011) -will treat her home dose Thorazine and add verapamil for preventive treatment. Continue topamax .CT scan of the head was done and showed no acute processes, relates this to happen in the past. We'll use Dilaudid when necessary for any breakthrough pain. Phenergan for nausea  . We'll get physical therapy to see her.  UDS negative. U/A does not shows any sign of infections. Mild leukocytosis. Has remained afebrile. She relates no cough or diarhea.   2.Diabetes type 2, uncontrolled (03/10/2011) Will put her n.p.o. for now as patient cannot swallow. We'll start her on sliding scale insulin and continue half the dose of her home insulin. Stop her oral regimen.  3. Hyperlipidemia:  Will resume home meds once patient is able to swallow.  4. Anxiety:  continue ativan  5.Depression:  Continue home meds.      LOS: 1 day   [facility] Hights 05/07/2011, 7:12 PM

## 2011-05-07 NOTE — Progress Notes (Signed)
CARE MANAGEMENT NOTE 05/07/2011  Patient:  [name],[name]   Account Number:  [id number]  Date Initiated:  05/07/2011  Documentation initiated by:  PEARSON,COOKIE  Subjective/Objective Assessment:   pT ADMITTED WITH [facility] LEFT SIDED HEMIPLEGIA, HEMIPLEGIC MIGRAINES     Action/Plan:   PLAN TO DC HOME   Anticipated DC Date:  05/09/2011   Anticipated DC Plan:  HOME/SELF CARE      DC Planning Services  CM consult      PAC Choice  NA   Choice offered to / List presented to:     DME arranged  NA      DME agency  NA     HH arranged  NA      HH agency  NA   Status of service:  In process, will continue to follow Medicare Important Message given?  NA - LOS <3 / Initial given by admissions (If response is "NO", the following Medicare IM given date fields will be blank) Date Medicare IM given:   Date Additional Medicare IM given:    Discharge Disposition:  HOME/SELF CARE  Per UR Regulation:  Reviewed for med. necessity/level of care/duration of stay  Comments:  05/07/11 MPEARSON, RN, BSN PT WITH MULTIPLE ADMISSION WITH [facility] MIGRAINES AND LEFT SIDED HEMIPLEGIA. PLAN TO DC HOME WITH NO NEEDS AT PRESENT TIME.

## 2011-05-08 DIAGNOSIS — G43809 Other migraine, not intractable, without status migrainosus: Secondary | ICD-10-CM | POA: Diagnosis not present

## 2011-05-08 DIAGNOSIS — D7289 Other specified disorders of white blood cells: Secondary | ICD-10-CM | POA: Diagnosis not present

## 2011-05-08 DIAGNOSIS — E1165 Type 2 diabetes mellitus with hyperglycemia: Secondary | ICD-10-CM | POA: Diagnosis not present

## 2011-05-08 DIAGNOSIS — F329 Major depressive disorder, single episode, unspecified: Secondary | ICD-10-CM | POA: Diagnosis not present

## 2011-05-08 LAB — GLUCOSE, CAPILLARY
Glucose-Capillary: 150 mg/dL — ABNORMAL HIGH (ref 70–99)
Glucose-Capillary: 127 mg/dL — ABNORMAL HIGH (ref 70–99)
Glucose-Capillary: 151 mg/dL — ABNORMAL HIGH (ref 70–99)

## 2011-05-08 MED ORDER — POLYETHYLENE GLYCOL 3350 17 G PO PACK
17.0000 g | PACK | Freq: Every day | ORAL | Status: DC
  Filled 2011-05-08 (×2): qty 1

## 2011-05-08 MED ORDER — DIPHENHYDRAMINE HCL 50 MG/ML IJ SOLN
INTRAMUSCULAR | Status: AC
  Administered 2011-05-08: 25 mg
  Filled 2011-05-08: qty 1

## 2011-05-08 MED ORDER — VERAPAMIL HCL 40 MG PO TABS: 40.0000 mg | ORAL_TABLET | Freq: Three times a day (TID) | ORAL | Status: DC

## 2011-05-08 MED ORDER — VERAPAMIL HCL 40 MG PO TABS: 40.0000 mg | ORAL_TABLET | Freq: Two times a day (BID) | ORAL | Status: DC

## 2011-05-08 MED ORDER — ACETAMINOPHEN 325 MG PO TABS
650.0000 mg | ORAL_TABLET | Freq: Four times a day (QID) | ORAL | Status: DC | PRN
  Administered 2011-05-08: 650 mg via ORAL
  Filled 2011-05-08: qty 2

## 2011-05-08 MED ORDER — DIPHENHYDRAMINE HCL 50 MG/ML IJ SOLN: 12.5000 mg | Freq: Four times a day (QID) | INTRAMUSCULAR | Status: DC | PRN

## 2011-05-08 MED ORDER — HYDROCODONE-ACETAMINOPHEN 5-325 MG PO TABS
1.0000 | ORAL_TABLET | ORAL | Status: DC | PRN
  Administered 2011-05-08: 1 via ORAL
  Filled 2011-05-08: qty 2

## 2011-05-08 NOTE — Evaluation (Addendum)
Occupational Therapy Evaluation Patient Details Name: [name] MRN: [id number] DOB: 01/08/1976 Today's Date: 05/08/2011 9:05-9:30  Problem List:  Patient Active Problem List  Diagnoses  . Hemiplegic migraine  . Conversion disorder  . Diabetes type 2, uncontrolled  . Abdominal pain  . Diarrhea    Past Medical History:  Past Medical History  Diagnosis Date  . Depression   . Anxiety   . Diabetes mellitus   . GERD (gastroesophageal reflux disease)   . Migraine   . ADD (attention deficit disorder)   . Hyperlipemia   . Herniated disc    Past Surgical History:  Past Surgical History  Procedure Date  . Cholecystectomy   . Cesarean section   . Neuropathy     OT Assessment/Plan/Recommendation OT Assessment Clinical Impression Statement: This 36 yo female admitted with migraine causing hemiplegia on left side (this has happened several times before per chart)--all affecting pts PLOF at I with BADLs. WIll benefit from acute OT to get back to an I or Mod I level with perhaps follow-up HHOT (if progress is slow). OT Recommendation/Assessment: Patient will need skilled OT in the acute care venue OT Problem List: Decreased strength;Decreased range of motion;Decreased activity tolerance;Impaired balance (sitting and/or standing);Decreased coordination;Decreased knowledge of use of DME or AE;Impaired sensation;Impaired UE functional use Barriers to Discharge: None OT Therapy Diagnosis : Generalized weakness;Hemiplegia non-dominant side OT Plan OT Frequency: Min 2X/week OT Treatment/Interventions: Self-care/ADL training;Therapeutic exercise;DME and/or AE instruction;Therapeutic activities;Patient/family education;Balance training OT Recommendation Follow Up Recommendations: Home health OT (if progress is slow) Equipment Recommended: Other (comment) (TBD) Individuals Consulted Consulted and Agree with Results and Recommendations: Patient OT Goals Acute Rehab OT Goals OT Goal  Formulation: With patient Time For Goal Achievement: 7 days ADL Goals Pt Will Perform Grooming: with set-up;Unsupported;Sitting, chair;Sitting, edge of bed;Other (comment) (using LUE as a gross assist) ADL Goal: Grooming - Progress: Goal set today Pt Will Perform Upper Body Bathing: with set-up;with supervision;Sitting, chair;Sitting, edge of bed;Unsupported ADL Goal: Upper Body Bathing - Progress: Goal set today Pt Will Perform Lower Body Bathing: with min assist;Sit to stand from chair;Sit to stand from bed;Unsupported ADL Goal: Lower Body Bathing - Progress: Goal set today Pt Will Perform Upper Body Dressing: with set-up;with supervision;Unsupported;Sitting, chair;Sitting, bed ADL Goal: Upper Body Dressing - Progress: Goal set today Pt Will Perform Lower Body Dressing: with min assist;Unsupported;Sit to stand from chair;Sit to stand from bed ADL Goal: Lower Body Dressing - Progress: Goal set today Pt Will Transfer to Toilet: Other (comment);Ambulation;Comfort height toilet (min guard A) ADL Goal: Toilet Transfer - Progress: Goal set today Pt Will Perform Toileting - Clothing Manipulation: Standing;Other (comment) (min guard A) ADL Goal: Toileting - Clothing Manipulation - Progress: Goal set today Pt Will Perform Toileting - Hygiene: Independently;Sitting on 3-in-1 or toilet ADL Goal: Toileting - Hygiene - Progress: Goal set today Arm Goals Additional Arm Goal #1: The pt will be I with  S/AROM of LUE to increase ROM and strength. Arm Goal: Additional Goal #1 - Progress: Goal set today  OT Evaluation Precautions/Restrictions  Precautions Precautions: Fall Precaution Comments: pt has had this same presentation of symptoms several times before Required Braces or Orthoses: No Restrictions Weight Bearing Restrictions: No Prior Functioning Home Living Lives With: Family;Spouse Receives Help From: Family Type of Home: Apartment Home Layout: Two level Home Access: Stairs to  enter [employer] of Steps: 12 Bathroom Shower/Tub: Forensic scientist: Standard Prior Function Level of Independence: Independent with basic ADLs;Independent with homemaking with ambulation;Independent with  gait;Independent with transfers ADL ADL Eating/Feeding: Simulated;Set up Eating/Feeding Details (indicate cue type and reason): due to decreased use LUE Where Assessed - Eating/Feeding: Bed level Grooming: Simulated;Set up Grooming Details (indicate cue type and reason): due to decreased use LUE Where Assessed - Grooming: Sitting, bed;Unsupported Upper Body Bathing: Simulated;Minimal assistance Upper Body Bathing Details (indicate cue type and reason): due to decreased use LUE Where Assessed - Upper Body Bathing: Unsupported;Sitting, bed Lower Body Bathing: Simulated;Moderate assistance Lower Body Bathing Details (indicate cue type and reason): due to decreased use LUE and LLE Where Assessed - Lower Body Bathing: Supported;Sit to stand from bed Upper Body Dressing: Performed;Moderate assistance Upper Body Dressing Details (indicate cue type and reason): due to decreased use LUE Where Assessed - Upper Body Dressing: Unsupported;Sitting, bed Lower Body Dressing: Maximal assistance Lower Body Dressing Details (indicate cue type and reason): due to decreased use LUE and LLE Where Assessed - Lower Body Dressing: Supported;Sit to stand from bed Toilet Transfer: Simulated;Minimal assistance Toilet Transfer Details (indicate cue type and reason): bed to door with chair then pulled up behind her due to c/o dizziness Toilet Transfer Method: Ambulating (wiht RW) Toileting - Clothing Manipulation: Simulated;Moderate assistance Toileting - Clothing Manipulation Details (indicate cue type and reason): due to decreased use LUE Where Assessed - Toileting Clothing Manipulation: Standing Toileting - Hygiene: Simulated;Minimal assistance Toileting - Hygiene Details  (indicate cue type and reason): due to decreased use LUE Where Assessed - Toileting Hygiene: Standing Tub/Shower Transfer: Not assessed Tub/Shower Transfer Method: Not assessed Ambulation Related to ADLs: Min A to advance the LLE Vision/Perception  Vision - History Baseline Vision: Wears glasses only for reading Patient Visual Report: Blurring of vision Vision - Assessment Eye Alignment: Within Functional Limits Cognition Cognition Arousal/Alertness: Awake/alert Overall Cognitive Status: Appears within functional limits for tasks assessed Orientation Level: Oriented X4 Sensation/Coordination Sensation Light Touch: Impaired Detail Light Touch Impaired Details: Impaired LUE ("I can feel it a little" ) Coordination Gross Motor Movements are Fluid and Coordinated: No Fine Motor Movements are Fluid and Coordinated: No Coordination and Movement Description: Pt will only 1/5 at shoulder otherwise 0/5 in LUE Extremity Assessment RUE Assessment RUE Assessment: Within Functional Limits LUE Assessment LUE Assessment: Exceptions to [facility] LUE Strength LUE Overall Strength Comments: 1/5 shoulder, 0/5 rest Mobility  Bed Mobility Bed Mobility: Yes Supine to Sit: 4: Min assist;With rails;HOB elevated (Comment degrees) (20 degrees) Supine to Sit Details (indicate cue type and reason): A needed for LLE Sitting - Scoot to Edge of Bed: 7: Independent Transfers Transfers: Yes Sit to Stand: Without upper extremity assist;From bed;Other (comment) (min guard A) Stand to Sit: With armrests;To chair/3-in-1;Other (comment) (min guard A) Exercises   End of Session OT - End of Session Equipment Utilized During Treatment: Gait belt;Other (comment) (RW) Activity Tolerance: Other (comment) (limited by dizziness) Patient left: in chair;Other (comment) (with PT finishing with her) General Behavior During Session: [facility] for tasks performed Cognition: [facility] for tasks performed   Evette Georges  [phone] 05/08/2011, 11:11 AM

## 2011-05-08 NOTE — Progress Notes (Signed)
Physical Therapy Treatment Patient Details Name: [name] MRN: [id number] DOB: 1975/06/07 Today's Date: 05/08/2011  PT Assessment/Plan  PT - Assessment/Plan Comments on Treatment Session: pt continues steady improvement but continues to need assist with left leg.  She does not feel she is ready to go home until tomorrow PT Plan: Discharge plan remains appropriate PT Frequency: Min 3X/week Follow Up Recommendations: Home health PT Equipment Recommended: None recommended by PT PT Goals  Acute Rehab PT Goals PT Goal Formulation: With patient Time For Goal Achievement: 2 weeks PT Goal: Supine/Side to Sit - Progress: Progressing toward goal PT Goal: Sit to Stand - Progress: Met PT Goal: Ambulate - Progress: Progressing toward goal  PT Treatment Precautions/Restrictions  Precautions Precautions: Fall Precaution Comments: pt has had this same presentation of symptoms several times before Required Braces or Orthoses: No Restrictions Weight Bearing Restrictions: No Mobility (including Balance) Bed Mobility Bed Mobility: Yes Rolling Right: 3: Mod assist Rolling Right Details (indicate cue type and reason): pt does not use left leg to assist with rollng but is able to use shoulders and pelvis duing movement Supine to Sit: 4: Min assist;With rails;HOB elevated (Comment degrees) (20 degrees) Supine to Sit Details (indicate cue type and reason): A needed for LLE Sitting - Scoot to Edge of Bed: 7: Independent Sit to Supine: 4: Min assist Sit to Supine - Details (indicate cue type and reason): needs assist to bring left leg up onto bed Transfers Transfers: Yes Sit to Stand: 5: Supervision;With upper extremity assist Sit to Stand Details (indicate cue type and reason): pt able to stand from chair with supervision Stand to Sit: 5: Supervision Ambulation/Gait Ambulation/Gait: Yes Ambulation/Gait Assistance: 3: Mod assist Ambulation/Gait Assistance Details (indicate cue type and reason):  continues to need assist to move leg left as pt continues to drag it.  She is able to improve distance.  Ambulation Distance (Feet): 20 Feet Assistive device: Rolling walker Gait Pattern: Step-to pattern;Decreased weight shift to right;Decreased dorsiflexion - left;Decreased step length - left;Left foot flat Gait velocity: slow Stairs: No Wheelchair Mobility Wheelchair Mobility: No  Posture/Postural Control Posture/Postural Control: Postural limitations Postural Limitations: pt obese with generalized deconditioning Balance Balance Assessed: No Exercise  Other Exercises Other Exercises: sit to stand with isometric abds and glutes and trunk extension in standing Other Exercises: right leg hip flexion, knee extension, doriflexion in sitting Other Exercises: left leg active assisted/passive hip flexion, knee extension, dorsiflexion in sitting (pt with little assistance) Other Exercises: standing balance and weight shifting onto left LE End of Session PT - End of Session Equipment Utilized During Treatment: Gait belt Activity Tolerance: Patient limited by fatigue Patient left: in bed;with call bell in reach Nurse Communication: Mobility status for ambulation General Behavior During Session: [facility] for tasks performed Cognition: [facility] for tasks performed  Donnetta Hail 05/08/2011, 11:33 AM

## 2011-05-08 NOTE — Progress Notes (Signed)
Speech Language/Pathology Clinical/Bedside Swallow Evaluation Patient Details  Name: [name] MRN: [id number] DOB: 1975-07-20 Today's Date: 05/08/2011  Past Medical History:  Past Medical History  Diagnosis Date  . Depression   . Anxiety   . Diabetes mellitus   . GERD (gastroesophageal reflux disease)   . Migraine   . ADD (attention deficit disorder)   . Hyperlipemia   . Herniated disc    Past Surgical History:  Past Surgical History  Procedure Date  . Cholecystectomy   . Cesarean section   . Neuropathy    HPI:  The patient is a 36 y/o F with a hx of hemiplegic migraines, questionable conversion disorder, anxiety, depression, and DM who was admitted with c/o typical migraine that is assoc with left sided hemiplegia. Multiple prior evals in ED for same with intermittent hospital admission, neurology consults. Multiple CT scans and MRIs with no evidence of stroke. Current episode began with mild HA, progressed to mild left-sided weakness and numbness present upon waking, then to full left-sided hemiplegia with left-sided facial droop and slurred speech which prompted her visit to the ED. Dx hemiplegic migraine.  Pt c/o difficulty swallowing due to purported "swelling" in her neck.  Pt currently drinking thin liquids.   Assessment/Recommendations/Treatment Plan   Clinical Impression: Pt's swallow function is WNL with active mastication, swift swallow response, and adequate toleration of multiple consistencies during successive swallows.  CN assessment with inconsistent findings (XII, sensory V) .  No symptoms suggesting dysphagia, aspiration. No f/u recommended.  Swallow Evaluation Recommendations Solid Consistency: Regular Liquid Consistency: Thin Liquid Administration via: Straw Medication Administration: Whole meds with liquid Supervision: Patient able to self feed Follow up Recommendations: None  Treatment Plan Treatment Plan Recommendations: No treatment recommended at  this time     Individuals Consulted Consulted and Agree with Results and Recommendations: Patient  Marchelle Folks L. Samson Frederic, [place] CCC/SLP Pager [phone]   Blenda Mounts Laurice 05/08/2011,3:53 PM

## 2011-05-08 NOTE — Progress Notes (Signed)
Physical Therapy Treatment Patient Details Name: [name] MRN: [id number] DOB: April 11, 1975 Today's Date: 05/08/2011  PT Assessment/Plan  PT - Assessment/Plan Comments on Treatment Session: pt improved today with less left face droop and improved, but still slurred speech. Pt still with decreased use of left arm and leg. PT Plan: Discharge plan remains appropriate PT Frequency: Min 3X/week Follow Up Recommendations: Home health PT Equipment Recommended: Other (comment) (TBD) PT Goals  Acute Rehab PT Goals PT Goal Formulation: With patient Time For Goal Achievement: 2 weeks PT Goal: Supine/Side to Sit - Progress: Progressing toward goal PT Goal: Sit to Stand - Progress: Progressing toward goal PT Goal: Ambulate - Progress: Progressing toward goal  PT Treatment Precautions/Restrictions  Precautions Precautions: Fall Precaution Comments: pt has had this same presentation of symptoms several times before Required Braces or Orthoses: No Restrictions Weight Bearing Restrictions: No Mobility (including Balance) Bed Mobility Bed Mobility: Yes Rolling Right: 3: Mod assist Rolling Right Details (indicate cue type and reason): pt does not use left leg to assist with rollng but is able to use shoulders and pelvis duing movement Supine to Sit: 4: Min assist;With rails;HOB elevated (Comment degrees) (20 degrees) Supine to Sit Details (indicate cue type and reason): A needed for LLE Sitting - Scoot to Edge of Bed: 7: Independent Transfers Transfers: Yes Sit to Stand: Without upper extremity assist;From bed;Other (comment) (min guard A) Sit to Stand Details (indicate cue type and reason): does not use left arm, but able to use lower body for sit to stand and keeps trunk in midline Stand to Sit: With armrests;To chair/3-in-1;Other (comment) (min guard A) Ambulation/Gait Ambulation/Gait: Yes Ambulation/Gait Assistance: 3: Mod assist Ambulation/Gait Assistance Details (indicate cue type and  reason): able to keep left hand on RW independently, pt needs assist to lift left leg as she does not lift from hip and does not lift foot or toes to clear floor Ambulation Distance (Feet): 8 Feet Assistive device: Rolling walker Gait Pattern: Step-to pattern;Decreased weight shift to right;Decreased dorsiflexion - left;Decreased step length - left;Left foot flat (drags left leg) Gait velocity: slow (limited by "I'm lightheaded!") Stairs: No Wheelchair Mobility Wheelchair Mobility: No  Posture/Postural Control Posture/Postural Control: Postural limitations Postural Limitations: pt obese with generalized deconditioning Balance Balance Assessed: No Exercise  Other Exercises Other Exercises: sit to stand with isometric abds and glutes and trunk extension in standing Other Exercises: right leg hip flexion, knee extension, doriflexion in sitting Other Exercises: left leg active assisted/passive hip flexion, knee extension, dorsiflexion in sitting (pt with little assistance) Other Exercises: standing balance and weight shifting onto left LE End of Session PT - End of Session Equipment Utilized During Treatment: Gait belt Activity Tolerance: Patient limited by fatigue (lightheaded, c/o being tired) Patient left: in chair;with call bell in reach Nurse Communication: Mobility status for transfers;Mobility status for ambulation General Behavior During Session: [facility] for tasks performed Cognition: [facility] for tasks performed  Donnetta Hail 05/08/2011, 11:24 AM

## 2011-05-08 NOTE — Discharge Summary (Signed)
Physician Discharge Summary  [name] MRN: [id number] DOB/AGE: 1975-10-09 35 y.o.  PCP: Berenda Morale, MD, MD   Admit date: 05/06/2011 Discharge date: 05/08/2011  Discharge Diagnoses:    Hemiplegic migraine  Diabetes type 2, uncontrolled   Medication List  As of 05/08/2011 10:35 AM   TAKE these medications         ALPRAZolam 1 MG tablet   Commonly known as: XANAX   Take 1 mg by mouth daily as needed. anxiety      amphetamine-dextroamphetamine 30 MG tablet   Commonly known as: ADDERALL   Take 30 mg by mouth daily as needed. For concentration      ARIPiprazole 5 MG tablet   Commonly known as: ABILIFY   Take 5 mg by mouth at bedtime.      atorvastatin 20 MG tablet   Commonly known as: LIPITOR   Take 20 mg by mouth daily.      chlorproMAZINE 25 MG tablet   Commonly known as: THORAZINE   Take 25 mg by mouth daily as needed. headaches      cyclobenzaprine 10 MG tablet   Commonly known as: FLEXERIL   Take 1 tablet (10 mg total) by mouth 2 (two) times daily as needed. Headaches      desvenlafaxine 50 MG 24 hr tablet   Commonly known as: PRISTIQ   Take 50 mg by mouth daily.      fenofibrate micronized 200 MG capsule   Commonly known as: LOFIBRA   Take 200 mg by mouth every evening.      insulin aspart 100 UNIT/ML injection   Commonly known as: novoLOG   Inject 6-10 Units into the skin 3 (three) times daily before meals. 6units breakfast 8units lunch 10 units dinnertime.      insulin glargine 100 UNIT/ML injection   Commonly known as: LANTUS   Inject 30 Units into the skin at bedtime.      metFORMIN 1000 MG (MOD) 24 hr tablet   Commonly known as: GLUMETZA   Take 1,000 mg by mouth every evening.      promethazine 25 MG tablet   Commonly known as: PHENERGAN   Take 25 mg by mouth every 6 (six) hours as needed. For nausea      topiramate 100 MG tablet   Commonly known as: TOPAMAX   Take 1 tablet (100 mg total) by mouth 2 (two) times daily.      urea 10 %  cream   Commonly known as: CARMOL   Apply 1 application topically at bedtime.      verapamil 40 MG tablet   Commonly known as: CALAN   Take 1 tablet (40 mg total) by mouth 3 (three) times daily.      VICTOZA 18 MG/3ML Soln   Generic drug: Liraglutide   Inject 1.8 mLs into the skin daily after lunch daily after lunch.            Discharge Condition: Stable Disposition: Home or Self Care   Consults: None  Significant Diagnostic Studies: Ct Head Wo Contrast  05/06/2011  *RADIOLOGY REPORT*  Clinical Data: Worsening migraine with neck pain and left sided numbness.  Left facial droop.  CT HEAD WITHOUT CONTRAST  Technique:  Contiguous axial images were obtained from the base of the skull through the vertex without contrast.  Comparison: 03/09/2011.  Findings: No evidence of acute infarct, acute hemorrhage, mass lesion, mass effect or hydrocephalus.  Visualized paranasal sinuses and mastoid air cells are clear.  IMPRESSION: Negative.  Original Report Authenticated By: Reyes Ivan, M.D.     Microbiology: No results found for this or any previous visit (from the past 240 hour(s)).   Labs: Results for orders placed during the hospital encounter of 05/06/11 (from the past 48 hour(s))  CBC     Status: Abnormal   Collection Time   05/06/11 12:49 PM      Component Value Range Comment   WBC 10.9 (*) 4.0 - 10.5 (K/uL)    RBC 5.39 (*) 3.87 - 5.11 (MIL/uL)    Hemoglobin 14.1  12.0 - 15.0 (g/dL)    HCT 16.1  09.6 - 04.5 (%)    MCV 79.6  78.0 - 100.0 (fL)    MCH 26.2  26.0 - 34.0 (pg)    MCHC 32.9  30.0 - 36.0 (g/dL)    RDW 40.9  81.1 - 91.4 (%)    Platelets 255  150 - 400 (K/uL)   BASIC METABOLIC PANEL     Status: Abnormal   Collection Time   05/06/11 12:49 PM      Component Value Range Comment   Sodium 138  135 - 145 (mEq/L)    Potassium 4.1  3.5 - 5.1 (mEq/L)    Chloride 99  96 - 112 (mEq/L)    CO2 24  19 - 32 (mEq/L)    Glucose, Bld 101 (*) 70 - 99 (mg/dL)    BUN 10  6 - 23  (mg/dL)    Creatinine, Ser 7.82  0.50 - 1.10 (mg/dL)    Calcium 95.6  8.4 - 10.5 (mg/dL)    GFR calc non Af Amer >90  >90 (mL/min)    GFR calc Af Amer >90  >90 (mL/min)   HEMOGLOBIN A1C     Status: Abnormal   Collection Time   05/06/11 12:49 PM      Component Value Range Comment   Hemoglobin A1C 7.9 (*) <5.7 (%)    Mean Plasma Glucose 180 (*) <117 (mg/dL)   URINE RAPID DRUG SCREEN (HOSP PERFORMED)     Status: Normal   Collection Time   05/06/11  4:00 PM      Component Value Range Comment   Opiates NONE DETECTED  NONE DETECTED     Cocaine NONE DETECTED  NONE DETECTED     Benzodiazepines NONE DETECTED  NONE DETECTED     Amphetamines NONE DETECTED  NONE DETECTED     Tetrahydrocannabinol NONE DETECTED  NONE DETECTED     Barbiturates NONE DETECTED  NONE DETECTED    URINALYSIS, ROUTINE W REFLEX MICROSCOPIC     Status: Normal   Collection Time   05/06/11  4:00 PM      Component Value Range Comment   Color, Urine YELLOW  YELLOW     APPearance CLEAR  CLEAR     Specific Gravity, Urine 1.012  1.005 - 1.030     pH 6.0  5.0 - 8.0     Glucose, UA NEGATIVE  NEGATIVE (mg/dL)    Hgb urine dipstick NEGATIVE  NEGATIVE     Bilirubin Urine NEGATIVE  NEGATIVE     Ketones, ur NEGATIVE  NEGATIVE (mg/dL)    Protein, ur NEGATIVE  NEGATIVE (mg/dL)    Urobilinogen, UA 0.2  0.0 - 1.0 (mg/dL)    Nitrite NEGATIVE  NEGATIVE     Leukocytes, UA NEGATIVE  NEGATIVE  MICROSCOPIC NOT DONE ON URINES WITH NEGATIVE PROTEIN, BLOOD, LEUKOCYTES, NITRITE, OR GLUCOSE <1000 mg/dL.  GLUCOSE, CAPILLARY  Status: Abnormal   Collection Time   05/06/11  6:09 PM      Component Value Range Comment   Glucose-Capillary 107 (*) 70 - 99 (mg/dL)   GLUCOSE, CAPILLARY     Status: Abnormal   Collection Time   05/06/11  8:33 PM      Component Value Range Comment   Glucose-Capillary 112 (*) 70 - 99 (mg/dL)   GLUCOSE, CAPILLARY     Status: Abnormal   Collection Time   05/07/11  7:40 AM      Component Value Range Comment    Glucose-Capillary 112 (*) 70 - 99 (mg/dL)    Comment 1 Documented in Chart      Comment 2 Notify RN     GLUCOSE, CAPILLARY     Status: Abnormal   Collection Time   05/07/11 11:53 AM      Component Value Range Comment   Glucose-Capillary 139 (*) 70 - 99 (mg/dL)   GLUCOSE, CAPILLARY     Status: Abnormal   Collection Time   05/07/11  4:54 PM      Component Value Range Comment   Glucose-Capillary 159 (*) 70 - 99 (mg/dL)    Comment 1 Notify RN     GLUCOSE, CAPILLARY     Status: Abnormal   Collection Time   05/07/11  9:38 PM      Component Value Range Comment   Glucose-Capillary 194 (*) 70 - 99 (mg/dL)    Comment 1 Notify RN     GLUCOSE, CAPILLARY     Status: Abnormal   Collection Time   05/08/11  7:40 AM      Component Value Range Comment   Glucose-Capillary 150 (*) 70 - 99 (mg/dL)    Comment 1 Notify RN        HPI : 36 year old female with past medical history of anxiety, attention deficit disorder , migraine (hemiplegic migraines) admitted on 03/08/2011 for complicated migraines. Comes in again with headache slurred speech and left-sided hemiplegia on the day of admission. Last time she was seen minimal was a day prior to admission. She relates she woke up with  weakness on her left side which progressively got worse to the point where she had dysarthria, and was unable to use her left upper extremity and could barely walk because of weakness in the left lower extremity. She relates she has been taking her medications at home, she relates it happened in the past. She relates difficulty swallowing.  C. she denies any dysuria, no cough, also contacts, no fever, no falls or trauma.  HOSPITAL COURSE: 1.Hemiplegic migraine (03/10/2011) -will treat her home dose Thorazine and  verapamil was added for preventive treatment at 40 mg 3 times a day by mouth.  she was continued on Topamax .CT scan of the head was done and showed no acute processes,  patient has had multiple MRIs before and therefore  this was not repeated .  We used Dilaudid when necessary for any breakthrough pain. Phenergan for nausea .  she was assessed by physical therapy, according to then assessment she does not need a skilled nursing facility at this point .UDS negative. U/A does not shows any sign of infections. Mild leukocytosis. Has remained afebrile. She relates no cough or diarhea.   2.Diabetes type 2, uncontrolled (03/10/2011) while in the hospital she was started on sliding scale insulin . She has been instructed to resume her oral home regimen   3. Hyperlipidemia:   this was stable continue home medicines  4. Anxiety:  continue home regimen  5.Depression:  Continue home meds.    Discharge Exam: Blood pressure 121/70, pulse 80, temperature 98.7 F (37.1 C), temperature source Oral, resp. rate 12, height 5\' 7"  (1.702 m), weight 131.2 kg (289 lb 3.9 oz), SpO2 96.00%.  Lungs:  Clear to auscultation bilaterally, respirations unlabored  Chest wall:  No tenderness or deformity  Heart:  Regular rate and rhythm, S1 and S2 normal, no murmur, rub or gallop  Abdomen: Soft, non-tender, bowel sounds active all four quadrants,  no masses, no organomegaly  Genitalia:  Normal female without lesion, discharge or tenderness  Rectal: Normal tone, normal prostate, no masses or tenderness;  guaiac negative stool  Extremities:  Extremities normal, atraumatic, no cyanosis or edema  Pulses:  2+ and symmetric all extremities  Skin:  Skin color, texture, turgor normal, no rashes or lesions  Lymph nodes:  Cervical, supraclavicular, and axillary nodes normal  Neurologic:  CN III-VI intact. She has slurred speech. With mild nasal labial fold depression. Left side 1/5 strength, right side 5/5.     Discharge Orders    Future Orders Please Complete By Expires   Diet - low sodium heart healthy      Increase activity slowly      Call MD for:  severe uncontrolled pain      Call MD for:  persistant dizziness or  light-headedness         Follow-up Information    Follow up with Berenda Morale, MD .        [facility]  neurology in one week or hemiplegic migraine    Signed: Richarda Overlie 05/08/2011, 10:35 AM

## 2011-05-08 NOTE — Progress Notes (Signed)
Speech Language/Pathology  Order received for bedside swallow eval secondary to slurred speech, hemiparesis, and pt c/o difficulty swallowing per MD.  Pt to undergo RN stroke swallow screen per Dr.Abrol; if she does not pass screen, SLP will administer clinical swallow evaluation.    Will follow for plan. Thanks,  Tiare Rohlman L. Samson Frederic, [place] CCC/SLP Pager [phone]

## 2011-05-08 NOTE — Progress Notes (Signed)
   CARE MANAGEMENT NOTE 05/08/2011  Patient:  [name],[name]   Account Number:  [id number]  Date Initiated:  05/07/2011  Documentation initiated by:  PEARSON,COOKIE  Subjective/Objective Assessment:   pT ADMITTED WITH [facility] LEFT SIDED HEMIPLEGIA, HEMIPLEGIC MIGRAINES     Action/Plan:   PLAN TO DC HOME   Anticipated DC Date:  05/08/2011   Anticipated DC Plan:  HOME W HOME HEALTH SERVICES      DC Planning Services  CM consult      [facility] Choice  NA   Choice offered to / List presented to:  C-1 Patient   DME arranged  NA      DME agency  NA     HH arranged  HH-2 PT  HH-3 OT      [facility] agency  [facility]   Status of service:  Completed, signed off Medicare Important Message given?  NA - LOS <3 / Initial given by admissions (If response is "NO", the following Medicare IM given date fields will be blank) Date Medicare IM given:   Date Additional Medicare IM given:    Discharge Disposition:  HOME W HOME HEALTH SERVICES  Per UR Regulation:  Reviewed for med. necessity/level of care/duration of stay  Comments:  05/08/11 Aarushi Hemric RN,BSN NCM [phone] PT/OT-HH.PATIENT ALREADY HAS RW.HAS USED [facility] IN PAST,IN AGREEMENT TO USE THEM AGAIN.TC DEBBIE W/[facility] FOR REFERRAL-H&P,FACE SHEET,ORDERS PROVIDED,& NOTIFED OF ?D/C TODAY W/HH.  05/07/11 MPEARSON, RN, BSN PT WITH MULTIPLE ADMISSION WITH [facility] MIGRAINES AND LEFT SIDED HEMIPLEGIA. PLAN TO DC HOME WITH NO NEEDS AT PRESENT TIME.

## 2011-05-09 DIAGNOSIS — F329 Major depressive disorder, single episode, unspecified: Secondary | ICD-10-CM | POA: Diagnosis not present

## 2011-05-09 DIAGNOSIS — G43809 Other migraine, not intractable, without status migrainosus: Secondary | ICD-10-CM | POA: Diagnosis not present

## 2011-05-09 DIAGNOSIS — E1165 Type 2 diabetes mellitus with hyperglycemia: Secondary | ICD-10-CM | POA: Diagnosis not present

## 2011-05-09 DIAGNOSIS — D7289 Other specified disorders of white blood cells: Secondary | ICD-10-CM | POA: Diagnosis not present

## 2011-05-09 NOTE — Progress Notes (Signed)
Subjective: I want to go home She states that her speech is much improved  Objective: Vital signs in last 24 hours: Filed Vitals:   05/08/11 0925 05/08/11 1309 05/08/11 2151 05/09/11 0609  BP:  128/85 114/76 102/67  Pulse: 87 80 86 85  Temp:  98.6 F (37 C) 98.4 F (36.9 C) 97.6 F (36.4 C)  TempSrc:  Oral Oral Oral  Resp:   16 16  Height:      Weight:      SpO2: 94%  94% 95%    Intake/Output Summary (Last 24 hours) at 05/09/11 1035 Last data filed at 05/09/11 0500  Gross per 24 hour  Intake    500 ml  Output   2526 ml  Net  -2026 ml    Weight change:   Weight change:  Lungs:  Clear to auscultation bilaterally, respirations unlabored  Chest wall:  No tenderness or deformity  Heart:  Regular rate and rhythm, S1 and S2 normal, no murmur, rub or gallop  Abdomen: Soft, non-tender, bowel sounds active all four quadrants,  no masses, no organomegaly  Genitalia:  Normal female without lesion, discharge or tenderness  Rectal: Normal tone, normal prostate, no masses or tenderness;  guaiac negative stool  Extremities:  Extremities normal, atraumatic, no cyanosis or edema  Pulses:  2+ and symmetric all extremities  Skin:  Skin color, texture, turgor normal, no rashes or lesions  Lymph nodes:  Cervical, supraclavicular, and axillary nodes normal  Neurologic:  CN III-VI intact. She has slurred speech. With mild nasal labial fold depression. Left side 1/5 strength, right side 5/5.      Lab Results: Results for orders placed during the hospital encounter of 05/06/11 (from the past 24 hour(s))  GLUCOSE, CAPILLARY     Status: Abnormal   Collection Time   05/08/11 11:35 AM      Component Value Range   Glucose-Capillary 127 (*) 70 - 99 (mg/dL)   Comment 1 Documented in Chart     Comment 2 Notify RN    GLUCOSE, CAPILLARY     Status: Abnormal   Collection Time   05/08/11  4:49 PM      Component Value Range   Glucose-Capillary 151 (*) 70 - 99 (mg/dL)  GLUCOSE, CAPILLARY      Status: Abnormal   Collection Time   05/08/11  9:48 PM      Component Value Range   Glucose-Capillary 210 (*) 70 - 99 (mg/dL)  GLUCOSE, CAPILLARY     Status: Abnormal   Collection Time   05/09/11  7:46 AM      Component Value Range   Glucose-Capillary 149 (*) 70 - 99 (mg/dL)     Micro: No results found for this or any previous visit (from the past 240 hour(s)).  Studies/Results: No results found.  Medications:  Scheduled Meds:   . amphetamine-dextroamphetamine  30 mg Oral Daily  . ARIPiprazole  5 mg Oral QHS  . diphenhydrAMINE      . heparin  5,000 Units Subcutaneous Q8H  . insulin aspart  0-9 Units Subcutaneous TID AC & HS  . insulin glargine  15 Units Subcutaneous QHS  . polyethylene glycol  17 g Oral Daily  . sodium chloride  10-40 mL Intracatheter Q12H  . venlafaxine  37.5 mg Oral Q breakfast  . verapamil  40 mg Oral Q12H   Continuous Infusions:  PRN Meds:.acetaminophen, ALPRAZolam, chlorproMAZINE, diphenhydrAMINE, promethazine, promethazine, sodium chloride, DISCONTD: HYDROcodone-acetaminophen, DISCONTD: HYDROmorphone   Assessment:   1.Hemiplegic migraine (  03/10/2011) - continue current treatment Followup with neurology upon discharge in a week  2.Diabetes type 2, uncontrolled (03/10/2011)  continue outpatient oral regimen  3. Hyperlipidemia:  Will resume home meds once patient is able to swallow.  4. Anxiety:  continue ativan  5.Depression:  Continue home meds.  : Disposition to discharge home today with home health PT and OT   LOS: 3 days   [facility] 05/09/2011, 10:35 AM

## 2011-05-23 ENCOUNTER — Encounter (HOSPITAL_COMMUNITY): Payer: Self-pay | Admitting: *Deleted

## 2011-05-23 ENCOUNTER — Emergency Department (HOSPITAL_COMMUNITY)
Admission: EM | Admit: 2011-05-23 | Discharge: 2011-05-23 | Disposition: A | Discharge status: Home / Self Care | Payer: BC Managed Care – PPO | Attending: Emergency Medicine | Admitting: Emergency Medicine

## 2011-05-23 DIAGNOSIS — Z794 Long term (current) use of insulin: Secondary | ICD-10-CM | POA: Insufficient documentation

## 2011-05-23 DIAGNOSIS — S61209A Unspecified open wound of unspecified finger without damage to nail, initial encounter: Secondary | ICD-10-CM | POA: Insufficient documentation

## 2011-05-23 DIAGNOSIS — E119 Type 2 diabetes mellitus without complications: Secondary | ICD-10-CM | POA: Insufficient documentation

## 2011-05-23 DIAGNOSIS — W268XXA Contact with other sharp object(s), not elsewhere classified, initial encounter: Secondary | ICD-10-CM | POA: Insufficient documentation

## 2011-05-23 DIAGNOSIS — S61219A Laceration without foreign body of unspecified finger without damage to nail, initial encounter: Secondary | ICD-10-CM

## 2011-05-23 MED ORDER — OXYCODONE-ACETAMINOPHEN 5-325 MG PO TABS
2.0000 | ORAL_TABLET | Freq: Once | ORAL | Status: AC
  Administered 2011-05-23: 2 via ORAL
  Filled 2011-05-23: qty 2

## 2011-05-23 MED ORDER — OXYCODONE-ACETAMINOPHEN 5-325 MG PO TABS: 1.0000 | ORAL_TABLET | Freq: Four times a day (QID) | ORAL | Status: AC | PRN

## 2011-05-23 MED ORDER — LIDOCAINE HCL 2 % IJ SOLN
INTRAMUSCULAR | Status: AC
  Administered 2011-05-23: 15:00:00
  Filled 2011-05-23: qty 1

## 2011-05-23 MED ORDER — BACITRACIN ZINC 500 UNIT/GM EX OINT
TOPICAL_OINTMENT | CUTANEOUS | Status: AC
  Administered 2011-05-23: 1
  Filled 2011-05-23: qty 5.4

## 2011-05-23 MED ORDER — TETANUS-DIPHTH-ACELL PERTUSSIS 5-2.5-18.5 LF-MCG/0.5 IM SUSP
0.5000 mL | Freq: Once | INTRAMUSCULAR | Status: AC
  Administered 2011-05-23: 0.5 mL via INTRAMUSCULAR
  Filled 2011-05-23: qty 0.5

## 2011-05-23 NOTE — ED Provider Notes (Signed)
History     CSN: [id number]  Arrival date & time 05/23/11  1320   First MD Initiated Contact with Patient 05/23/11 1517      Chief Complaint  Patient presents with  . Extremity Laceration    (Consider location/radiation/quality/duration/timing/severity/associated sxs/prior treatment) HPI Comments: Patient reports that just prior to arrival she was chopping vegetable with a mandelin and sliced her finger.  She reports that "a chunk of the skin on her finger is missing."     Patient is a 36 y.o. female presenting with skin laceration. The history is provided by the patient.  Laceration  The incident occurred 1 to 2 hours ago. Pain location: right 5th digit. Size: 1.5 cm. Injury mechanism: mandelin. The pain is moderate. The pain has been constant since onset. She reports no foreign bodies present. Her tetanus status is out of date.    Past Medical History  Diagnosis Date  . Depression   . Anxiety   . Diabetes mellitus   . GERD (gastroesophageal reflux disease)   . Migraine   . ADD (attention deficit disorder)   . Hyperlipemia   . Herniated disc     Past Surgical History  Procedure Date  . Cholecystectomy   . Cesarean section   . Tonsillectomy     No family history on file.  History  Substance Use Topics  . Smoking status: Never Smoker   . Smokeless tobacco: Never Used  . Alcohol Use: Yes    OB History    Grav Para Term Preterm Abortions TAB SAB Ect Mult Living                  Review of Systems  Constitutional: Negative for fever and chills.  Gastrointestinal: Negative for nausea and vomiting.  Skin: Positive for wound.  Neurological: Negative for syncope.    Allergies  Atarax; Cephalosporins; Codeine; Compazine; Decadron; Demerol; Morphine and related; Penicillins; Reglan; Sulfa antibiotics; and Zofran  Home Medications   Current Outpatient Rx  Name Route Sig Dispense Refill  . ALPRAZOLAM 1 MG PO TABS Oral Take 1 mg by mouth daily as needed.  anxiety    . AMPHETAMINE-DEXTROAMPHETAMINE 30 MG PO TABS Oral Take 30 mg by mouth daily as needed. For concentration    . ARIPIPRAZOLE 5 MG PO TABS Oral Take 5 mg by mouth at bedtime.     . ATORVASTATIN CALCIUM 20 MG PO TABS Oral Take 20 mg by mouth daily.    . CHLORPROMAZINE HCL 25 MG PO TABS Oral Take 25 mg by mouth daily as needed. headaches    . CYCLOBENZAPRINE HCL 10 MG PO TABS Oral Take 1 tablet (10 mg total) by mouth 2 (two) times daily as needed. Headaches 30 tablet   . DESVENLAFAXINE SUCCINATE ER 50 MG PO TB24 Oral Take 50 mg by mouth daily.      . FENOFIBRATE MICRONIZED 200 MG PO CAPS Oral Take 200 mg by mouth every evening.     . INSULIN ASPART 100 UNIT/ML [place] SOLN Subcutaneous Inject 6-10 Units into the skin 3 (three) times daily before meals. 6units breakfast 8units lunch 10 units dinnertime.    . INSULIN GLARGINE 100 UNIT/ML [place] SOLN Subcutaneous Inject 35 Units into the skin at bedtime.     Marland Kitchen LIRAGLUTIDE 18 MG/3ML [place] SOLN Subcutaneous Inject 1.8 mLs into the skin daily after lunch daily after lunch.      . METFORMIN HCL ER (MOD) 1000 MG PO TB24 Oral Take 1,000 mg by mouth every evening.     Marland Kitchen  PROMETHAZINE HCL 25 MG PO TABS Oral Take 25 mg by mouth every 6 (six) hours as needed. For nausea    . TOPIRAMATE 100 MG PO TABS Oral Take 1 tablet (100 mg total) by mouth 2 (two) times daily. 60 tablet 1  . VERAPAMIL HCL 40 MG PO TABS Oral Take 40 mg by mouth 2 (two) times daily.      BP 121/59  Pulse 91  Temp(Src) 98.7 F (37.1 C) (Oral)  Resp 18  SpO2 97%  Physical Exam  Nursing note and vitals reviewed. Constitutional: She is oriented to person, place, and time. She appears well-developed and well-nourished. No distress.  HENT:  Head: Normocephalic and atraumatic.  Neck: Normal range of motion. Neck supple.  Cardiovascular: Normal rate, regular rhythm and normal heart sounds.   Pulmonary/Chest: Effort normal and breath sounds normal.  Musculoskeletal:       Patient able to move  the DIP and PIP of right 5th digit.  Neurological: She is alert and oriented to person, place, and time. No sensory deficit.  Skin: She is not diaphoretic.       Approximately 1.5 cm x 1 cm deep skin abrasion involving the epidermis and partial thickness of the dermis of the lateral distal right 5th digit.  Piece of skin missing.  No subungal hematoma.    Distal sensation intact.   Radial pulses 2+ bilaterally   Psychiatric: She has a normal mood and affect.    ED Course  Procedures (including critical care time)  Labs Reviewed - No data to display No results found.   No diagnosis found.  Patient given digital block and wound irrigated well.  Patient also given tetanus while in Ed.  MDM  Patient missing a piece of skin, but no laceration that could be sutured back together.  Skin could not be pulled together to suture together and did not involve the full thickness of the dermis.         Pascal Lux [place], PA-C 05/25/11 1552  Pascal Lux [place], PA-C 05/25/11 [phone]

## 2011-05-23 NOTE — Discharge Instructions (Signed)
Fingertip Injuries and Amputations Fingertip injuries are common and often get injured because they are last to escape when pulling your hand out of harm's way.  HOME CARE INSTRUCTIONS   You may resume normal diet and activities as directed or allowed.   Keep your hand elevated above the level of your heart. This helps decrease pain and swelling.   Keep ice packs (or a bag of ice wrapped in a towel) on the injured area for 15 to 20 minutes, 3 to 4 times per day, for the first two days.   Change dressings if necessary or as directed.   Clean the wound daily or as directed.   Only take over-the-counter or prescription medicines for pain, discomfort, or fever as directed by your caregiver.   Keep appointments as directed.  SEEK IMMEDIATE MEDICAL CARE IF:  You develop redness, swelling, numbness or increasing pain in the wound.   There is pus coming from the wound.   You develop an unexplained oral temperature above 102 F (38.9 C) or as your caregiver suggests.   There is a foul (bad) smell coming from the wound or dressing.   There is a breaking open of the wound (edges not staying together) after sutures or staples have been removed.  MAKE SURE YOU:   Understand these instructions.   Will watch your condition.   Will get help right away if you are not doing well or get worse.  Document Released: 01/28/2005 Document Revised: 11/19/2010 Document Reviewed: 12/28/2007 [facility] Patient Information 2012 [place], [place].

## 2011-05-23 NOTE — ED Notes (Signed)
Pt reports she was chopping up vegetables today and cut tip of right 5th digit approximately one hour ago. Pt does not have a recent tetanus shot. Pt continues to bleed, bleeding controlled with pressure

## 2011-05-25 NOTE — ED Provider Notes (Signed)
Medical screening examination/treatment/procedure(s) were performed by non-physician practitioner and as supervising physician I was immediately available for consultation/collaboration.   Glynn Octave, MD 05/25/11 [phone]

## 2011-06-08 DIAGNOSIS — Z130 Encounter for screening for diseases of the blood and blood-forming organs and certain disorders involving the immune mechanism: Secondary | ICD-10-CM | POA: Diagnosis not present

## 2011-06-08 DIAGNOSIS — Z124 Encounter for screening for malignant neoplasm of cervix: Secondary | ICD-10-CM | POA: Diagnosis not present

## 2011-06-09 ENCOUNTER — Other Ambulatory Visit: Payer: Self-pay | Admitting: Obstetrics and Gynecology

## 2011-06-09 DIAGNOSIS — N6452 Nipple discharge: Secondary | ICD-10-CM

## 2011-06-15 ENCOUNTER — Ambulatory Visit
Admission: RE | Admit: 2011-06-15 | Discharge: 2011-06-15 | Disposition: A | Discharge status: Home / Self Care | Payer: Medicare Other | Source: Ambulatory Visit | Attending: Obstetrics and Gynecology | Admitting: Obstetrics and Gynecology

## 2011-06-15 ENCOUNTER — Other Ambulatory Visit: Payer: Self-pay | Admitting: Obstetrics and Gynecology

## 2011-06-15 ENCOUNTER — Ambulatory Visit
Admission: RE | Admit: 2011-06-15 | Discharge: 2011-06-15 | Disposition: A | Discharge status: Home / Self Care | Payer: BC Managed Care – PPO | Source: Ambulatory Visit | Attending: Obstetrics and Gynecology | Admitting: Obstetrics and Gynecology

## 2011-06-15 DIAGNOSIS — N6452 Nipple discharge: Secondary | ICD-10-CM

## 2011-07-06 ENCOUNTER — Inpatient Hospital Stay: Admission: RE | Admit: 2011-07-06 | Payer: BC Managed Care – PPO | Source: Ambulatory Visit

## 2011-07-09 ENCOUNTER — Encounter (HOSPITAL_COMMUNITY): Payer: Self-pay | Admitting: *Deleted

## 2011-07-09 ENCOUNTER — Emergency Department (HOSPITAL_COMMUNITY)
Admission: EM | Admit: 2011-07-09 | Discharge: 2011-07-09 | Disposition: A | Discharge status: Home / Self Care | Payer: BC Managed Care – PPO | Attending: Emergency Medicine | Admitting: Emergency Medicine

## 2011-07-09 DIAGNOSIS — G4482 Headache associated with sexual activity: Secondary | ICD-10-CM | POA: Insufficient documentation

## 2011-07-09 DIAGNOSIS — K219 Gastro-esophageal reflux disease without esophagitis: Secondary | ICD-10-CM | POA: Insufficient documentation

## 2011-07-09 DIAGNOSIS — E785 Hyperlipidemia, unspecified: Secondary | ICD-10-CM | POA: Insufficient documentation

## 2011-07-09 DIAGNOSIS — E119 Type 2 diabetes mellitus without complications: Secondary | ICD-10-CM | POA: Insufficient documentation

## 2011-07-09 DIAGNOSIS — F341 Dysthymic disorder: Secondary | ICD-10-CM | POA: Insufficient documentation

## 2011-07-09 DIAGNOSIS — F988 Other specified behavioral and emotional disorders with onset usually occurring in childhood and adolescence: Secondary | ICD-10-CM | POA: Insufficient documentation

## 2011-07-09 DIAGNOSIS — Z79899 Other long term (current) drug therapy: Secondary | ICD-10-CM | POA: Insufficient documentation

## 2011-07-09 DIAGNOSIS — E669 Obesity, unspecified: Secondary | ICD-10-CM | POA: Insufficient documentation

## 2011-07-09 MED ORDER — SODIUM CHLORIDE 0.9 % IV BOLUS (SEPSIS)
1000.0000 mL | Freq: Once | INTRAVENOUS | Status: AC
  Administered 2011-07-09: 1000 mL via INTRAVENOUS

## 2011-07-09 MED ORDER — HYDROMORPHONE HCL PF 1 MG/ML IJ SOLN
1.0000 mg | Freq: Once | INTRAMUSCULAR | Status: AC
  Administered 2011-07-09: 1 mg via INTRAVENOUS
  Filled 2011-07-09: qty 1

## 2011-07-09 MED ORDER — PROMETHAZINE HCL 25 MG/ML IJ SOLN
25.0000 mg | Freq: Four times a day (QID) | INTRAMUSCULAR | Status: DC | PRN
  Administered 2011-07-09: 25 mg via INTRAVENOUS
  Filled 2011-07-09 (×2): qty 1

## 2011-07-09 NOTE — ED Provider Notes (Signed)
Assumed care from dr. Jeraldine Loots.  + migraine, given phenergan and dilaudid. Need recheck.  Cheri Guppy, MD 07/09/11 [phone]

## 2011-07-09 NOTE — Discharge Instructions (Signed)
Please be sure to speak with your physician tomorrow to ensure appropriate ongoing care.  Please discuss provision of a breakthrough pain medication to avoid further emergency department visits for headaches if possible.  If you have develop any new, or concerning changes in your condition, please return to the emergency department immediately.

## 2011-07-09 NOTE — ED Notes (Signed)
Pt reports history of migraines and is typically seen in the ER and given fluids, dilaudid and phenergan.  Pt migraine symptoms today are similar to previous episodes. New symptom is the the left foot weakness. Pt reports one sided weakness is typical of her migraines.  Pt denies vomiting, but reports nausea. Pt reports headache began last night and the left leg weakness started this AM.  Pt reports recent treatment with botox. Pt woke up this AM with slurred speech and trouble with seeing out of left eye.  Pt also reports she used her rescue medication perphenazine at 8AM without relief.

## 2011-07-09 NOTE — ED Provider Notes (Signed)
History     CSN: [id number]  Arrival date & time 07/09/11  1255   First MD Initiated Contact with Patient 07/09/11 1357      Chief Complaint  Patient presents with  . Migraine    pt has hx of hemiplegia migraines and states that this migraine started last night with left sided weakness, and light headed.     (Consider location/radiation/quality/duration/timing/severity/associated sxs/prior treatment) HPI This 36 year old female with multiple medical problems, including hemiplegia migraines now presents with headache and hemiplegia.  She was sitting began yesterday, gradually.  Since onset the headache has been persistent in spite of using home medications.  She notes that this headache is "the same" as in prior headaches, aside from mild dysesthesia in her two medial left toes. No new confusion, fevers, vomiting, chills, diarrhea.  No clear provoking factors, nor any alleviating or exacerbating conditions. Past Medical History  Diagnosis Date  . Depression   . Anxiety   . Diabetes mellitus   . GERD (gastroesophageal reflux disease)   . Migraine   . ADD (attention deficit disorder)   . Hyperlipemia   . Herniated disc     Past Surgical History  Procedure Date  . Cholecystectomy   . Cesarean section   . Tonsillectomy     History reviewed. No pertinent family history.  History  Substance Use Topics  . Smoking status: Never Smoker   . Smokeless tobacco: Never Used  . Alcohol Use: Yes    OB History    Grav Para Term Preterm Abortions TAB SAB Ect Mult Living                  Review of Systems  Constitutional:       HPI  HENT:       HPI otherwise negative  Eyes: Negative.   Respiratory:       HPI, otherwise negative  Cardiovascular:       HPI, otherwise nmegative  Gastrointestinal: Negative for vomiting.  Genitourinary:       HPI, otherwise negative  Musculoskeletal:       HPI, otherwise negative  Skin: Negative.   Neurological: Negative for syncope.     Allergies  Atarax; Cephalosporins; Codeine; Compazine; Triptans; Decadron; Demerol; Morphine and related; Penicillins; Reglan; Sulfa antibiotics; and Zofran  Home Medications   Current Outpatient Rx  Name Route Sig Dispense Refill  . AMPHETAMINE-DEXTROAMPHETAMINE 30 MG PO TABS Oral Take 30 mg by mouth 2 (two) times daily. At 8am and 12pm for concentration    . ATORVASTATIN CALCIUM 20 MG PO TABS Oral Take 20 mg by mouth daily.    . CYCLOBENZAPRINE HCL 10 MG PO TABS Oral Take 10 mg by mouth 2 (two) times daily as needed. For headaches    . FENOFIBRATE MICRONIZED 200 MG PO CAPS Oral Take 200 mg by mouth at bedtime.     . INSULIN ASPART 100 UNIT/ML [place] SOLN Subcutaneous Inject 6-10 Units into the skin 3 (three) times daily before meals. 6units breakfast 8units lunch 10 units dinnertime.    . INSULIN GLARGINE 100 UNIT/ML [place] SOLN Subcutaneous Inject 35 Units into the skin at bedtime.     Marland Kitchen LIRAGLUTIDE 18 MG/3ML  SOLN Subcutaneous Inject 1.8 mLs into the skin daily after lunch daily after lunch.      . METFORMIN HCL ER (MOD) 1000 MG PO TB24 Oral Take 1,000 mg by mouth every evening.     Marland Kitchen NORTRIPTYLINE HCL 25 MG PO CAPS Oral Take 100 mg by  mouth at bedtime.    Marland Kitchen PERPHENAZINE 4 MG PO TABS Oral Take 4 mg by mouth 3 (three) times daily.    Marland Kitchen PROMETHAZINE HCL 25 MG/ML IJ SOLN Intravenous Inject 25 mg into the vein every 4 (four) hours as needed. For nausea    . TOPIRAMATE 100 MG PO TABS Oral Take 100 mg by mouth 2 (two) times daily.    Marland Kitchen VERAPAMIL HCL 40 MG PO TABS Oral Take 60 mg by mouth 2 (two) times daily.      BP 110/69  Pulse 96  Temp(Src) 98 F (36.7 C) (Oral)  Resp 20  Wt 295 lb (133.811 kg)  SpO2 96%  Physical Exam  Nursing note and vitals reviewed. Constitutional: She is oriented to person, place, and time. She appears well-developed and well-nourished. No distress.       Obese F, presenting with her father.  He notes that this is "typical" for the patient's condition.  HENT:   Head: Normocephalic and atraumatic.  Eyes: Conjunctivae and EOM are normal.  Neck: Neck supple.  Cardiovascular: Normal rate and regular rhythm.   Pulmonary/Chest: Effort normal. No stridor. No respiratory distress.  Abdominal: She exhibits no distension. There is no tenderness.  Musculoskeletal: She exhibits no edema and no tenderness.  Neurological: She is alert and oriented to person, place, and time.       L facial droop, L hemi-plegia.  Strength on R is 5/5    ED Course  Procedures (including critical care time)  Labs Reviewed - No data to display No results found.   No diagnosis found.   3:54 PM The patient notes some improvement in her condition. MDM  This 36 year old female with hemiplegic migraines now presents with characteristically the same headache.  On initial exam the patient is in no distress though she has left-sided hemiplegia.  The patient had substantial improvement with medications.  Given the absence of new complaints, the patient and her father to go to the of this being atypical presentation, the patient's improving, she was treated with her requested medications and discharged in stable condition.      Gerhard Munch, MD 07/09/11 2256

## 2011-07-15 ENCOUNTER — Other Ambulatory Visit: Payer: Self-pay | Admitting: Obstetrics and Gynecology

## 2011-07-15 DIAGNOSIS — IMO0001 Reserved for inherently not codable concepts without codable children: Secondary | ICD-10-CM | POA: Diagnosis not present

## 2011-07-15 DIAGNOSIS — E049 Nontoxic goiter, unspecified: Secondary | ICD-10-CM | POA: Diagnosis not present

## 2011-07-15 DIAGNOSIS — E785 Hyperlipidemia, unspecified: Secondary | ICD-10-CM | POA: Diagnosis not present

## 2011-07-22 DIAGNOSIS — H1045 Other chronic allergic conjunctivitis: Secondary | ICD-10-CM | POA: Diagnosis not present

## 2011-07-22 DIAGNOSIS — J309 Allergic rhinitis, unspecified: Secondary | ICD-10-CM | POA: Diagnosis not present

## 2011-07-22 DIAGNOSIS — R0602 Shortness of breath: Secondary | ICD-10-CM | POA: Diagnosis not present

## 2011-07-27 ENCOUNTER — Emergency Department (HOSPITAL_COMMUNITY): Payer: BC Managed Care – PPO

## 2011-07-27 ENCOUNTER — Inpatient Hospital Stay (HOSPITAL_COMMUNITY)
Admission: EM | Admit: 2011-07-27 | Discharge: 2011-07-30 | DRG: 024 | Disposition: A | Discharge status: Home / Self Care | Payer: BC Managed Care – PPO | Attending: Internal Medicine | Admitting: Internal Medicine

## 2011-07-27 DIAGNOSIS — G43409 Hemiplegic migraine, not intractable, without status migrainosus: Principal | ICD-10-CM | POA: Diagnosis present

## 2011-07-27 DIAGNOSIS — G4733 Obstructive sleep apnea (adult) (pediatric): Secondary | ICD-10-CM | POA: Diagnosis present

## 2011-07-27 DIAGNOSIS — F3289 Other specified depressive episodes: Secondary | ICD-10-CM | POA: Diagnosis present

## 2011-07-27 DIAGNOSIS — R079 Chest pain, unspecified: Secondary | ICD-10-CM | POA: Diagnosis present

## 2011-07-27 DIAGNOSIS — E1165 Type 2 diabetes mellitus with hyperglycemia: Secondary | ICD-10-CM | POA: Diagnosis present

## 2011-07-27 DIAGNOSIS — G8194 Hemiplegia, unspecified affecting left nondominant side: Secondary | ICD-10-CM | POA: Diagnosis present

## 2011-07-27 DIAGNOSIS — F449 Dissociative and conversion disorder, unspecified: Secondary | ICD-10-CM | POA: Diagnosis present

## 2011-07-27 DIAGNOSIS — Z794 Long term (current) use of insulin: Secondary | ICD-10-CM

## 2011-07-27 DIAGNOSIS — F411 Generalized anxiety disorder: Secondary | ICD-10-CM | POA: Diagnosis present

## 2011-07-27 DIAGNOSIS — I6789 Other cerebrovascular disease: Secondary | ICD-10-CM | POA: Diagnosis not present

## 2011-07-27 DIAGNOSIS — K219 Gastro-esophageal reflux disease without esophagitis: Secondary | ICD-10-CM | POA: Diagnosis present

## 2011-07-27 DIAGNOSIS — Z823 Family history of stroke: Secondary | ICD-10-CM

## 2011-07-27 DIAGNOSIS — F988 Other specified behavioral and emotional disorders with onset usually occurring in childhood and adolescence: Secondary | ICD-10-CM | POA: Diagnosis present

## 2011-07-27 DIAGNOSIS — F329 Major depressive disorder, single episode, unspecified: Secondary | ICD-10-CM | POA: Diagnosis present

## 2011-07-27 DIAGNOSIS — E785 Hyperlipidemia, unspecified: Secondary | ICD-10-CM | POA: Diagnosis present

## 2011-07-27 DIAGNOSIS — IMO0001 Reserved for inherently not codable concepts without codable children: Secondary | ICD-10-CM | POA: Diagnosis present

## 2011-07-27 DIAGNOSIS — R51 Headache: Secondary | ICD-10-CM | POA: Diagnosis not present

## 2011-07-27 MED ORDER — HYDROMORPHONE HCL PF 2 MG/ML IJ SOLN
2.0000 mg | Freq: Once | INTRAMUSCULAR | Status: AC
  Administered 2011-07-28: 2 mg via INTRAVENOUS
  Filled 2011-07-27: qty 1

## 2011-07-27 MED ORDER — HYDROMORPHONE HCL PF 1 MG/ML IJ SOLN
1.0000 mg | Freq: Once | INTRAMUSCULAR | Status: AC
  Administered 2011-07-27: 1 mg via INTRAVENOUS
  Filled 2011-07-27: qty 1

## 2011-07-27 MED ORDER — PROMETHAZINE HCL 25 MG/ML IJ SOLN
25.0000 mg | Freq: Once | INTRAMUSCULAR | Status: AC
  Administered 2011-07-27: 25 mg via INTRAVENOUS
  Filled 2011-07-27 (×2): qty 1

## 2011-07-27 NOTE — ED Notes (Signed)
Pt here with c/o headache with left side paralysis, weak grip strength, positive facial droop left side. Symptoms began at 8pm tonight . Alert and oriented, with slurred speech.

## 2011-07-27 NOTE — ED Notes (Signed)
WRU:EAVW<UJ> Expected date:07/27/11<BR> Expected time: 9:35 PM<BR> Means of arrival:Ambulance<BR> Comments:<BR> M10. 36 f. Migraine headache with left side deficit. 10-15 mins

## 2011-07-28 ENCOUNTER — Inpatient Hospital Stay (HOSPITAL_COMMUNITY): Payer: BC Managed Care – PPO

## 2011-07-28 ENCOUNTER — Encounter (HOSPITAL_COMMUNITY): Payer: Self-pay | Admitting: Family Medicine

## 2011-07-28 DIAGNOSIS — F341 Dysthymic disorder: Secondary | ICD-10-CM

## 2011-07-28 DIAGNOSIS — R4789 Other speech disturbances: Secondary | ICD-10-CM | POA: Diagnosis not present

## 2011-07-28 DIAGNOSIS — G43809 Other migraine, not intractable, without status migrainosus: Secondary | ICD-10-CM

## 2011-07-28 DIAGNOSIS — R079 Chest pain, unspecified: Secondary | ICD-10-CM

## 2011-07-28 DIAGNOSIS — R51 Headache: Secondary | ICD-10-CM

## 2011-07-28 DIAGNOSIS — G8194 Hemiplegia, unspecified affecting left nondominant side: Secondary | ICD-10-CM | POA: Diagnosis present

## 2011-07-28 LAB — CBC
HCT: 36.6 % (ref 36.0–46.0)
Hemoglobin: 13 g/dL (ref 12.0–15.0)
Hemoglobin: 11.9 g/dL — ABNORMAL LOW (ref 12.0–15.0)
MCH: 26.2 pg (ref 26.0–34.0)
MCH: 26.5 pg (ref 26.0–34.0)
MCHC: 32.5 g/dL (ref 30.0–36.0)
MCV: 81.5 fL (ref 78.0–100.0)
RBC: 4.97 MIL/uL (ref 3.87–5.11)
RBC: 4.49 MIL/uL (ref 3.87–5.11)
WBC: 12.1 10*3/uL — ABNORMAL HIGH (ref 4.0–10.5)

## 2011-07-28 LAB — TROPONIN I: Troponin I: <0.3 ng/mL (ref <0.30)

## 2011-07-28 LAB — BASIC METABOLIC PANEL
BUN: 10 mg/dL (ref 6–23)
CO2: 21 mEq/L (ref 19–32)
CO2: 22 mEq/L (ref 19–32)
Calcium: 8.8 mg/dL (ref 8.4–10.5)
Calcium: 8.3 mg/dL — ABNORMAL LOW (ref 8.4–10.5)
Chloride: 100 mEq/L (ref 96–112)
GFR calc non Af Amer: >90 mL/min (ref >90)
Glucose, Bld: 146 mg/dL — ABNORMAL HIGH (ref 70–99)
Glucose, Bld: 144 mg/dL — ABNORMAL HIGH (ref 70–99)
Sodium: 136 mEq/L (ref 135–145)
Sodium: 138 mEq/L (ref 135–145)

## 2011-07-28 LAB — HEPATIC FUNCTION PANEL
ALT: 24 U/L (ref 0–35)
Alkaline Phosphatase: 63 U/L (ref 39–117)
Bilirubin, Direct: <0.1 mg/dL (ref 0.0–0.3)
Total Bilirubin: 0.4 mg/dL (ref 0.3–1.2)
Total Protein: 7 g/dL (ref 6.0–8.3)

## 2011-07-28 LAB — RAPID URINE DRUG SCREEN, HOSP PERFORMED
Amphetamines: NOT DETECTED
Barbiturates: NOT DETECTED
Benzodiazepines: NOT DETECTED
Cocaine: NOT DETECTED
Tetrahydrocannabinol: NOT DETECTED

## 2011-07-28 LAB — GLUCOSE, CAPILLARY: Glucose-Capillary: 150 mg/dL — ABNORMAL HIGH (ref 70–99)

## 2011-07-28 MED ORDER — POTASSIUM CHLORIDE IN NACL 20-0.9 MEQ/L-% IV SOLN
INTRAVENOUS | Status: DC
  Administered 2011-07-28 (×2): via INTRAVENOUS
  Administered 2011-07-29: 10 mL/h via INTRAVENOUS
  Administered 2011-07-29: 01:00:00 via INTRAVENOUS
  Filled 2011-07-28 (×5): qty 1000

## 2011-07-28 MED ORDER — PROMETHAZINE HCL 25 MG/ML IJ SOLN
12.5000 mg | Freq: Four times a day (QID) | INTRAMUSCULAR | Status: DC | PRN
  Administered 2011-07-29 – 2011-07-30 (×6): 12.5 mg via INTRAVENOUS
  Filled 2011-07-28 (×7): qty 1

## 2011-07-28 MED ORDER — PROMETHAZINE HCL 25 MG/ML IJ SOLN
6.2500 mg | Freq: Once | INTRAMUSCULAR | Status: AC
  Administered 2011-07-28: 6.25 mg via INTRAVENOUS
  Filled 2011-07-28: qty 1

## 2011-07-28 MED ORDER — SIMVASTATIN 20 MG PO TABS
20.0000 mg | ORAL_TABLET | Freq: Every day | ORAL | Status: DC
  Administered 2011-07-28: 20 mg via ORAL
  Filled 2011-07-28: qty 1

## 2011-07-28 MED ORDER — ENOXAPARIN SODIUM 40 MG/0.4ML ~~LOC~~ SOLN
40.0000 mg | SUBCUTANEOUS | Status: DC
  Administered 2011-07-28 – 2011-07-30 (×3): 40 mg via SUBCUTANEOUS
  Filled 2011-07-28 (×3): qty 0.4

## 2011-07-28 MED ORDER — HYDROMORPHONE HCL PF 1 MG/ML IJ SOLN
1.0000 mg | INTRAMUSCULAR | Status: DC | PRN
  Administered 2011-07-28 (×2): 1 mg via INTRAVENOUS
  Filled 2011-07-28 (×2): qty 1

## 2011-07-28 MED ORDER — PERPHENAZINE 4 MG PO TABS
4.0000 mg | ORAL_TABLET | Freq: Three times a day (TID) | ORAL | Status: DC
  Administered 2011-07-28 – 2011-07-30 (×6): 4 mg via ORAL
  Filled 2011-07-28 (×9): qty 1

## 2011-07-28 MED ORDER — CYCLOBENZAPRINE HCL 10 MG PO TABS
10.0000 mg | ORAL_TABLET | Freq: Two times a day (BID) | ORAL | Status: DC | PRN
  Filled 2011-07-28: qty 1

## 2011-07-28 MED ORDER — INSULIN ASPART 100 UNIT/ML ~~LOC~~ SOLN
0.0000 [IU] | Freq: Three times a day (TID) | SUBCUTANEOUS | Status: DC
  Administered 2011-07-28: 2 [IU] via SUBCUTANEOUS
  Administered 2011-07-28 – 2011-07-29 (×3): 3 [IU] via SUBCUTANEOUS
  Administered 2011-07-29: 2 [IU] via SUBCUTANEOUS
  Administered 2011-07-30: 3 [IU] via SUBCUTANEOUS

## 2011-07-28 MED ORDER — BUTALBITAL-APAP-CAFFEINE 50-325-40 MG PO TABS
2.0000 | ORAL_TABLET | Freq: Four times a day (QID) | ORAL | Status: DC | PRN
  Administered 2011-07-28 – 2011-07-30 (×4): 2 via ORAL
  Filled 2011-07-28 (×4): qty 2

## 2011-07-28 MED ORDER — INSULIN ASPART 100 UNIT/ML ~~LOC~~ SOLN: 0.0000 [IU] | Freq: Every day | SUBCUTANEOUS | Status: DC

## 2011-07-28 MED ORDER — INSULIN GLARGINE 100 UNIT/ML ~~LOC~~ SOLN
40.0000 [IU] | Freq: Every day | SUBCUTANEOUS | Status: DC
  Administered 2011-07-28 – 2011-07-29 (×2): 40 [IU] via SUBCUTANEOUS

## 2011-07-28 MED ORDER — SENNOSIDES-DOCUSATE SODIUM 8.6-50 MG PO TABS
1.0000 | ORAL_TABLET | Freq: Every evening | ORAL | Status: DC | PRN
  Filled 2011-07-28: qty 1

## 2011-07-28 MED ORDER — ASPIRIN 325 MG PO TABS
325.0000 mg | ORAL_TABLET | Freq: Every day | ORAL | Status: DC
Start: 2011-07-28 — End: 2011-07-28
  Administered 2011-07-28: 325 mg via ORAL
  Filled 2011-07-28: qty 1

## 2011-07-28 MED ORDER — METFORMIN HCL ER 750 MG PO TB24
1500.0000 mg | ORAL_TABLET | Freq: Every evening | ORAL | Status: DC
  Administered 2011-07-28: 1500 mg via ORAL
  Filled 2011-07-28 (×3): qty 2

## 2011-07-28 MED ORDER — VALPROATE SODIUM 500 MG/5ML IV SOLN
500.0000 mg | Freq: Once | INTRAVENOUS | Status: AC
  Administered 2011-07-28: 500 mg via INTRAVENOUS
  Filled 2011-07-28: qty 5

## 2011-07-28 MED ORDER — ASPIRIN 300 MG RE SUPP: 300.0000 mg | Freq: Every day | RECTAL | Status: DC

## 2011-07-28 MED ORDER — PROMETHAZINE HCL 12.5 MG PO TABS
6.2500 mg | ORAL_TABLET | Freq: Four times a day (QID) | ORAL | Status: DC | PRN
  Administered 2011-07-28: 6.25 mg via ORAL
  Filled 2011-07-28: qty 1

## 2011-07-28 MED ORDER — ATORVASTATIN CALCIUM 20 MG PO TABS
20.0000 mg | ORAL_TABLET | Freq: Every day | ORAL | Status: DC
  Administered 2011-07-29: 20 mg via ORAL
  Filled 2011-07-28 (×2): qty 1

## 2011-07-28 MED ORDER — NORTRIPTYLINE HCL 25 MG PO CAPS
100.0000 mg | ORAL_CAPSULE | Freq: Every day | ORAL | Status: DC
  Administered 2011-07-28 – 2011-07-29 (×2): 100 mg via ORAL
  Filled 2011-07-28 (×3): qty 4

## 2011-07-28 MED ORDER — TOPIRAMATE 100 MG PO TABS
100.0000 mg | ORAL_TABLET | Freq: Two times a day (BID) | ORAL | Status: DC
  Administered 2011-07-28 – 2011-07-30 (×5): 100 mg via ORAL
  Filled 2011-07-28 (×6): qty 1

## 2011-07-28 MED ORDER — ASPIRIN 300 MG RE SUPP
300.0000 mg | Freq: Every day | RECTAL | Status: DC
  Filled 2011-07-28 (×2): qty 1

## 2011-07-28 MED ORDER — AMPHETAMINE-DEXTROAMPHETAMINE 10 MG PO TABS
30.0000 mg | ORAL_TABLET | Freq: Two times a day (BID) | ORAL | Status: DC
  Filled 2011-07-28 (×2): qty 3
  Filled 2011-07-28: qty 1

## 2011-07-28 MED ORDER — ASPIRIN 325 MG PO TABS
325.0000 mg | ORAL_TABLET | Freq: Every day | ORAL | Status: DC
  Administered 2011-07-29 – 2011-07-30 (×2): 325 mg via ORAL
  Filled 2011-07-28 (×2): qty 1

## 2011-07-28 MED ORDER — SODIUM CHLORIDE 0.9 % IJ SOLN
3.0000 mL | Freq: Two times a day (BID) | INTRAMUSCULAR | Status: DC
  Administered 2011-07-28 – 2011-07-29 (×3): 3 mL via INTRAVENOUS

## 2011-07-28 MED ORDER — VERAPAMIL HCL 120 MG PO TABS
60.0000 mg | ORAL_TABLET | Freq: Two times a day (BID) | ORAL | Status: DC
  Administered 2011-07-28 – 2011-07-30 (×5): 60 mg via ORAL
  Filled 2011-07-28 (×8): qty 0.5

## 2011-07-28 NOTE — Evaluation (Signed)
Physical Therapy Evaluation Patient Details Name: [name] MRN: [id number] DOB: 1975-10-25 Today's Date: 07/28/2011 Time: 4098-1191 PT Time Calculation (min): 12 min  PT Assessment / Plan / Recommendation Clinical Impression  Pt presents with diagnosis of migraine headaches and L hemiplegia. Pt with multiple admissions for diagnosis. Limited evaluation due to pt c/o 10/10 HA and dizziness/nausea with standing. Pt will benefit from skilled PT to maximize independence and safety with functional mobility.    PT Assessment  Patient needs continued PT services    Follow Up Recommendations  Home health PT;Supervision/Assistance - 24 hour    Equipment Recommendations  None recommended by PT    Frequency Min 3X/week    Precautions / Restrictions Precautions Precautions: Fall Restrictions Weight Bearing Restrictions: No   Pertinent Vitals/Pain       Mobility  Bed Mobility Bed Mobility: Supine to Sit;Sit to Supine Supine to Sit: 3: Mod assist Sit to Supine: 3: Mod assist Details for Bed Mobility Assistance: Increased time. Assist for L LE onto/off bed. Assist for trunk to upright. Transfers Transfers: Sit to Stand;Stand to Sit Sit to Stand: 4: Min assist;From bed;With upper extremity assist Stand to Sit: 4: Min assist;To bed;With upper extremity assist Details for Transfer Assistance: VCs safety, technique, hand placment, posture. Assist to rise, stabilize. Assist to position L hand on RW but pt unable to maintain hold. No L knee buckling in standing noted. Ambulation/Gait Assistive device: Rolling walker Ambulation/Gait Assistance Details: Side steps to HOB x 2 with external assist to move L LE laterally and to navigate with RW. VCs safety, technique, posture. Pt c/o nausea and dizziness-deferred further attempt at ambulation for safety reasons    Exercises     PT Goals Acute Rehab PT Goals PT Goal Formulation: With patient Time For Goal Achievement: 08/04/11 Potential to  Achieve Goals: Good Pt will go Supine/Side to Sit: with supervision PT Goal: Supine/Side to Sit - Progress: Goal set today Pt will go Sit to Supine/Side: with supervision PT Goal: Sit to Supine/Side - Progress: Goal set today Pt will go Sit to Stand: with supervision PT Goal: Sit to Stand - Progress: Goal set today Pt will Ambulate: 51 - 150 feet;with supervision;with least restrictive assistive device PT Goal: Ambulate - Progress: Goal set today  Visit Information  Last PT Received On: 07/28/11 Assistance Needed: +2 (safety)    Subjective Data  Subjective: "I have a headache" Patient Stated Goal: Get better   Prior Functioning  Home Living Lives With: Spouse;Son;Daughter Type of Home: Apartment Home Access: Stairs to enter Entrance Stairs-Number of Steps: 1  flight Entrance Stairs-Rails: Right Home Layout: One level Home Adaptive Equipment: Walker - rolling Prior Function Level of Independence: Independent Able to Take Stairs?: Yes Communication Communication: Expressive difficulties    Cognition  Overall Cognitive Status: Appears within functional limits for tasks assessed/performed Arousal/Alertness: Awake/alert Orientation Level: Appears intact for tasks assessed Behavior During Session: [facility] for tasks performed    Extremity/Trunk Assessment Right Lower Extremity Assessment RLE ROM/Strength/Tone: Within functional levels RLE Sensation: WFL - Light Touch RLE Coordination: WFL - gross/fine motor Left Lower Extremity Assessment LLE ROM/Strength/Tone: Deficits LLE ROM/Strength/Tone Deficits: Pt unable to mobilize L LE during ROM and MMT.  LLE Sensation: Deficits LLE Sensation Deficits: Impaired/decreased light touch Trunk Assessment Trunk Assessment: Normal   Balance Balance Balance Assessed: Yes Static Sitting Balance Static Sitting - Balance Support: Feet supported Static Sitting - Level of Assistance: 5: Stand by assistance Static Standing Balance Static  Standing - Balance Support: Right  upper extremity supported Static Standing - Level of Assistance: 4: Min assist Static Standing - Comment/# of Minutes: Stood at [employer] 1 minute-no LOB or knee buckling noted  End of Session PT - End of Session Activity Tolerance: Patient limited by pain Patient left: in bed;with call bell/phone within reach   Rebeca Alert [facility] 07/28/2011, 10:51 AM [phone]

## 2011-07-28 NOTE — Progress Notes (Signed)
   CARE MANAGEMENT NOTE 07/28/2011  Patient:  Lenz,Lanyiah   Account Number:  [id number]  Date Initiated:  07/28/2011  Documentation initiated by:  Lanier Clam  Subjective/Objective Assessment:   ADMITTED W/MIGRAINE,L SIDE HEMIPLEGIA.     Action/Plan:   FROM HOME W/FAMILY.HAS RW.   Anticipated DC Date:  07/31/2011   Anticipated DC Plan:  HOME W HOME HEALTH SERVICES      DC Planning Services  CM consult      Choice offered to / List presented to:  C-1 Patient           [facility] agency  [facility]   Status of service:  In process, will continue to follow Medicare Important Message given?   (If response is "NO", the following Medicare IM given date fields will be blank) Date Medicare IM given:   Date Additional Medicare IM given:    Discharge Disposition:    Per UR Regulation:  Reviewed for med. necessity/level of care/duration of stay  If discussed at Long Length of Stay Meetings, dates discussed:    Comments:  07/28/11 Keiera Strathman RN,BSN NCM [phone] [facility] [email or web address].DEBBIE(LIASON) FOLLOWING FOR D/C. PT-HH.[facility]HC AGENCY LIST PROVIDED.

## 2011-07-28 NOTE — Progress Notes (Signed)
Patient ID: [name], female   DOB: Nov 20, 1975, 36 y.o.   MRN: [id number] I received a call from the patient's nurse at about 1:00 this afternoon stating that the patient wanted me to speak with her husband regarding the MRI. The patient's mother apparently was at the bedside and was refusing the MRI for the patient as was her husband who was in [place] at the time. They stated that the patient did not require an radiologic evaluation to rule out stroke as they were asked to ensure that this is secondary to her hemiplegic migraines. I attempted to speak with the patient who asked me to call her husband at the number provided. I called the patient's husband at [phone] and had a lengthy discussion with him. I explained to him that although all his symptoms may be attributed to hemiplegic migraine the patient herself raised the question about whether or not she could be having a stroke and indicated that she had a history of stroke with her family. I also explained that given her presentation of hemiplegia, I was  erring on the side of caution by making recommendations for diagnostic studies and offering them to her it was their final decision as to whether or not the except of the recommendations. I also informed him that there was a neurology consult pending which should help to make recommendations regarding her headache appear This being said this would not be against any of the medical providers and we will comply with whatever their wishes were with regard to obtaining diagnostic studies. I did explain that this might however limit the extent of our ability to evaluate the patient and the treatment that could be provided in the absence of being able to test appropriately. The patient's husband went on to say that the patient has been admitted multiple times at [facility] long and that we should be well versed in her pain regimen and that they were unhappy with the fact that the patient was not  receiving IV Dilaudid. I explained to the patient's husband that in the event of a migraine headache IV Dilaudid would be contraindicated as it would intensify her headache rather than improve it. The patient's husband verbalize understanding and thank me for my concern.   I obtained from the patient's husband the name and phone number for the patient's neurologist Dr. Catalina Lunger in [facility] and call Dr. Eulis Manly office at [phone]. Dr. Catalina Lunger subsequently returned my call and we had a discussion regarding the patient. Dr. Catalina Lunger states that she advised the patient that when she was having these episodes that she should call her office rather than going to the emergency room. Furthermore she had advised the patient that she should not be taking Dilaudid because this would likely precipitate onset of her migraine headaches and she certainly should not have any narcotics particularly IV Dilaudid while she was in the throes of an active hemiplegic migraine headache. She is in full agreement that IV Dilaudid should not be given to this patient at the time of this headache as it was most likely being caused by her typical hemiplegic migraine. She suggested the patient might benefit from magnesium 1 g IV push over 5 minutes to abort the headache. I discussed with her that I had offered the patient Fioricet which she felt was a reasonable alternative to narcotic medications.  I received a call from the nurses after sitting the patient felt that the Fioricet was not controlling her headaches. I've ordered magnesium  1 g IV push over 5 minutes in the interest of aborting the migraine

## 2011-07-28 NOTE — Progress Notes (Signed)
VASCULAR LAB PRELIMINARY  PRELIMINARY  PRELIMINARY  PRELIMINARY  Carotid duplex completed.    Preliminary report:  Bilateral:  No evidence of hemodynamically significant internal carotid artery stenosis.   Vertebral artery flow is antegrade.     Terance Hart, RVT 07/28/2011, 1:39 PM

## 2011-07-28 NOTE — ED Provider Notes (Signed)
History     CSN: [id number]  Arrival date & time 07/27/11  2137   First MD Initiated Contact with Patient 07/27/11 2158      Chief Complaint  Patient presents with  . Headache     HPI The patient reports she developed a generalized headache this evening and developed weakness and paralysis of her left side.  She reports the symptoms began at 8 PM.  She has a long-standing history of hemiplegic migraines always with left-sided weakness associated with headache since 2006.  She is able to explain this to me in the hospital record supports this.  She's had multiple MRI scans without evidence of stroke.  She denies photophobia or phonophobia.  She has no nausea vomiting or diarrhea.  She reports usually takes Dilaudid and Phenergan for her headaches which seems to help her symptoms.  She reports sometimes she receives treatment in the ER and is able to go home because her weakness goes away.  She denies recent head trauma.  She is not on anticoagulants.  Her husband is currently in [place] and her mother is at home with her children.    Past Medical History  Diagnosis Date  . Depression   . Anxiety   . Diabetes mellitus   . GERD (gastroesophageal reflux disease)   . Migraine   . ADD (attention deficit disorder)   . Hyperlipemia   . Herniated disc     Past Surgical History  Procedure Date  . Cholecystectomy   . Cesarean section   . Tonsillectomy     No family history on file.  History  Substance Use Topics  . Smoking status: Never Smoker   . Smokeless tobacco: Never Used  . Alcohol Use: Yes    OB History    Grav Para Term Preterm Abortions TAB SAB Ect Mult Living                  Review of Systems  All other systems reviewed and are negative.    Allergies  Atarax; Cephalosporins; Codeine; Compazine; Triptans; Decadron; Demerol; Metoclopramide hcl; Morphine and related; Penicillins; Sulfa antibiotics; and Zofran  Home Medications   Current Outpatient Rx  Name  Route Sig Dispense Refill  . AMPHETAMINE-DEXTROAMPHETAMINE 30 MG PO TABS Oral Take 30 mg by mouth 2 (two) times daily. At 8am and 12pm for concentration    . ATORVASTATIN CALCIUM 20 MG PO TABS Oral Take 20 mg by mouth daily.    . CYCLOBENZAPRINE HCL 10 MG PO TABS Oral Take 10 mg by mouth 2 (two) times daily as needed. For headaches    . FENOFIBRATE MICRONIZED 200 MG PO CAPS Oral Take 200 mg by mouth at bedtime.     . INSULIN ASPART 100 UNIT/ML [place] SOLN Subcutaneous Inject 8-12 Units into the skin 3 (three) times daily before meals. 8units breakfast 10units lunch 12 units dinnertime.    . INSULIN GLARGINE 100 UNIT/ML  SOLN Subcutaneous Inject 40 Units into the skin at bedtime.     Marland Kitchen METFORMIN HCL ER (MOD) 1000 MG PO TB24 Oral Take 1,500 mg by mouth every evening.     Marland Kitchen NORTRIPTYLINE HCL 25 MG PO CAPS Oral Take 100 mg by mouth at bedtime.    Marland Kitchen PERPHENAZINE 4 MG PO TABS Oral Take 4 mg by mouth 3 (three) times daily.    Marland Kitchen PROMETHAZINE HCL 25 MG/ML [place] SOLN Intravenous Inject 25 mg into the vein every 4 (four) hours as needed. For nausea    .  TOPIRAMATE 100 MG PO TABS Oral Take 100 mg by mouth 2 (two) times daily.    Marland Kitchen VERAPAMIL HCL 40 MG PO TABS Oral Take 60 mg by mouth 2 (two) times daily.      BP 110/68  Pulse 103  Temp(Src) 98.5 F (36.9 C) (Oral)  Resp 16  SpO2 96%  Physical Exam  Nursing note and vitals reviewed. Constitutional: She is oriented to person, place, and time. She appears well-developed and well-nourished. No distress.  HENT:  Head: Normocephalic and atraumatic.  Eyes: EOM are normal.  Neck: Normal range of motion.  Cardiovascular: Normal rate, regular rhythm and normal heart sounds.   Pulmonary/Chest: Effort normal and breath sounds normal.  Abdominal: Soft. She exhibits no distension. There is no tenderness.  Musculoskeletal: Normal range of motion.  Neurological: She is alert and oriented to person, place, and time.       Speaks with slurred yet extremely  intelligible.  No facial droop. Left sided hemiplegia yet won't hit her face with her left hand/arm. Left arm and leg do not withdrawal to pain  Skin: Skin is warm and dry.  Psychiatric: She has a normal mood and affect. Judgment normal.    ED Course  Procedures (including critical care time)  Labs Reviewed - No data to display Ct Head Wo Contrast  07/27/2011  *RADIOLOGY REPORT*  Clinical Data: Headache with left side paralysis.  CT HEAD WITHOUT CONTRAST  Technique:  Contiguous axial images were obtained from the base of the skull through the vertex without contrast.  Comparison: 05/06/2011  Findings: There is no evidence for acute hemorrhage, hydrocephalus, mass lesion, or abnormal extra-axial fluid collection.  No definite CT evidence of acute infarct.  Bone windows show the visualized portions of the paranasal sinuses to be clear.  IMPRESSION: Normal exam.  Original Report Authenticated By: ERIC A. MANSELL, M.D.     1. Hemiplegic migraine       MDM  Likely represents hemiplegic migraine versus conversion disorder.  I suspect the latter.  The patient will not move her left arm and left leg.  This is not improving in the emergency department with treatment of her headache.  The patient will need to be admitted and work with PT and OT.  She was hospitalization for symptoms that were similar in February 2013. she's had multiple MRI scans for similar symptoms demonstrating no evidence of a stroke.  No indication for TPA        Lyanne Co, MD 07/28/11 [phone]

## 2011-07-28 NOTE — H&P (Addendum)
PCP:   Berenda Morale, MD, MD   Chief Complaint:  Migraine, slurred speech, left-sided hemiplegia  HPI: This is a 36 year old female with history of migraines with left hemiplegia. With a question of conversion disorder by psychiatry on previous visits. She presents report of a sudden onset migraine headache and left-sided hemiplegia and slurred speech. She states recognized the symptoms and came to the ER. Per patient's she has the symptoms and several times annually. She additionally states she had some left-sided chest pains which is new for her. She states it did not radiate. Currently 0-10.  Review of Systems:Positives bolded   anorexia, fever, weight loss,, vision loss, decreased hearing, hoarseness, chest pain, syncope, dyspnea on exertion, peripheral edema, balance deficits, hemoptysis, abdominal pain, melena, hematochezia, severe indigestion/heartburn, hematuria, incontinence, genital sores, muscle weakness, suspicious skin lesions, transient blindness, difficulty walking, depression, unusual weight change, abnormal bleeding, enlarged lymph nodes, angioedema, and breast masses.  Past Medical History: Past Medical History  Diagnosis Date  . Depression   . Anxiety   . Diabetes mellitus   . GERD (gastroesophageal reflux disease)   . Migraine   . ADD (attention deficit disorder)   . Hyperlipemia   . Herniated disc    Past Surgical History  Procedure Date  . Cholecystectomy   . Cesarean section   . Tonsillectomy     Medications: Prior to Admission medications   Medication Sig Start Date End Date Taking? Authorizing Provider  amphetamine-dextroamphetamine (ADDERALL) 30 MG tablet Take 30 mg by mouth 2 (two) times daily. At 8am and 12pm for concentration   Yes Historical Provider, MD  atorvastatin (LIPITOR) 20 MG tablet Take 20 mg by mouth daily.   Yes Historical Provider, MD  cyclobenzaprine (FLEXERIL) 10 MG tablet Take 10 mg by mouth 2 (two) times daily as needed. For headaches    Yes Historical Provider, MD  fenofibrate micronized (LOFIBRA) 200 MG capsule Take 200 mg by mouth at bedtime.    Yes Historical Provider, MD  insulin aspart (NOVOLOG) 100 UNIT/ML injection Inject 8-12 Units into the skin 3 (three) times daily before meals. 8units breakfast 10units lunch 12 units dinnertime.   Yes Historical Provider, MD  insulin glargine (LANTUS) 100 UNIT/ML injection Inject 40 Units into the skin at bedtime.    Yes Historical Provider, MD  metFORMIN (GLUMETZA) 1000 MG (MOD) 24 hr tablet Take 1,500 mg by mouth every evening.    Yes Historical Provider, MD  nortriptyline (PAMELOR) 25 MG capsule Take 100 mg by mouth at bedtime.   Yes Historical Provider, MD  perphenazine (TRILAFON) 4 MG tablet Take 4 mg by mouth 3 (three) times daily.   Yes Historical Provider, MD  promethazine (PHENERGAN) 25 MG/ML injection Inject 25 mg into the vein every 4 (four) hours as needed. For nausea   Yes Historical Provider, MD  topiramate (TOPAMAX) 100 MG tablet Take 100 mg by mouth 2 (two) times daily.   Yes Historical Provider, MD  verapamil (CALAN) 40 MG tablet Take 60 mg by mouth 2 (two) times daily.   Yes Historical Provider, MD    Allergies:   Allergies  Allergen Reactions  . Atarax (Hydroxyzine Hcl) Anaphylaxis  . Cephalosporins Anaphylaxis  . Codeine Anaphylaxis  . Compazine Anaphylaxis  . Triptans Anaphylaxis  . Decadron (Dexamethasone)   . Demerol   . Metoclopramide Hcl   . Morphine And Related   . Penicillins   . Sulfa Antibiotics   . Zofran     Social History:  reports that she has never  smoked. She has never used smokeless tobacco. She reports that she drinks alcohol. She reports that she does not use illicit drugs.  Family History: Family History  Problem Relation Age of Onset  . Diabetes type II      Physical Exam: Filed Vitals:   07/27/11 2156  BP: 110/68  Pulse: 103  Temp: 98.5 F (36.9 C)  TempSrc: Oral  Resp: 16  SpO2: 96%    General:  Alert and  oriented times three, well developed and nourished, no acute distress Eyes: PERRLA, pink conjunctiva, no scleral icterus ENT: Moist oral mucosa, neck supple, no thyromegaly Lungs: clear to ascultation, no wheeze, no crackles, no use of accessory muscles Cardiovascular: regular rate and rhythm, no regurgitation, no gallops, no murmurs. No carotid bruits, no JVD Abdomen: soft, positive BS, non-tender, non-distended, no organomegaly, not an acute abdomen GU: not examined Neuro: CN II - XII grossly intact, sensation intact Musculoskeletal: strength 5/5 right upper and lower extremities, 0-5 left upper and lower extremity, no clubbing, cyanosis or edema, no reproducible chest wall tenderness  Skin: no rash, no subcutaneous crepitation, no decubitus   Labs on Admission:   Basename 07/28/11 0101  NA 136  K 3.8  CL 100  CO2 21  GLUCOSE 146*  BUN 10  CREATININE 0.59  CALCIUM 8.8  MG --  PHOS --   No results found for this basename: AST:2,ALT:2,ALKPHOS:2,BILITOT:2,PROT:2,ALBUMIN:2 in the last 72 hours No results found for this basename: LIPASE:2,AMYLASE:2 in the last 72 hours  Basename 07/28/11 0101  WBC 12.1*  NEUTROABS --  HGB 13.0  HCT 39.7  MCV 79.9  PLT 260   No results found for this basename: CKTOTAL:3,CKMB:3,CKMBINDEX:3,TROPONINI:3 in the last 72 hours No components found with this basename: POCBNP:3 No results found for this basename: DDIMER:2 in the last 72 hours No results found for this basename: HGBA1C:2 in the last 72 hours No results found for this basename: CHOL:2,HDL:2,LDLCALC:2,TRIG:2,CHOLHDL:2,LDLDIRECT:2 in the last 72 hours No results found for this basename: TSH,T4TOTAL,FREET3,T3FREE,THYROIDAB in the last 72 hours No results found for this basename: VITAMINB12:2,FOLATE:2,FERRITIN:2,TIBC:2,IRON:2,RETICCTPCT:2 in the last 72 hours  Micro Results: No results found for this or any previous visit (from the past 240 hour(s)).   Radiological Exams on  Admission: Ct Head Wo Contrast  07/27/2011  *RADIOLOGY REPORT*  Clinical Data: Headache with left side paralysis.  CT HEAD WITHOUT CONTRAST  Technique:  Contiguous axial images were obtained from the base of the skull through the vertex without contrast.  Comparison: 05/06/2011  Findings: There is no evidence for acute hemorrhage, hydrocephalus, mass lesion, or abnormal extra-axial fluid collection.  No definite CT evidence of acute infarct.  Bone windows show the visualized portions of the paranasal sinuses to be clear.  IMPRESSION: Normal exam.  Original Report Authenticated By: ERIC A. MANSELL, M.D.   EKG: NSR  Assessment/Plan Present on Admission:  .Hemiplegic migraine Conversion disorder Admit to telemetry Patient states tripped and another migraine medications make her headache worse. Dilaudid and Phenergan are the only medications that work for her. She doesn't listed as allergies along with morphine therefore IV Dilaudid is been ordered.  Chest pains EKG and cardiac enzymes along with aspirin ordered .Diabetes type 2, uncontrolled  obstructive sleep apnea Depression and anxiety ADD Dyslipidemia Stable resume home medications 7. CPAP machine will be ordered ADA diet and sliding scale insulin, to be started after swallow evaluation done at bedside by nursing  Full code DVT prophylaxis Team 4/Dr. Loura Pardon, Alyxander Kollmann 07/28/2011, 2:39 AM

## 2011-07-28 NOTE — Consult Note (Signed)
[facility] CONSULT NOTE     Reason for Consult: HA    HPI:    [name] is an 36 y.o. female past medical history of anxiety, attention deficit disorder , migraine (hemiplegic migraines). Last MRI brain 06/21/10 negative for intracranial abnormality. CT head 5/6 2013 negative for intracranial abnormality. Carotid doppler 07/28/11 shows no ICA stenosis.  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 At that time she was diagnosed with conversion disorder by Dr. Rogers Blocker. Patient was seen by neurology at that time and Given Depakote in which HA resolved. On further visits VPA has been tried with little result.  Patient admitted to hospital with HA associated with left sided weakness and speech abnormality. She states her HA came on suddenly, was located over bilateral forehead. She denies any neck stiffness, or pain.  Per mother she has had some improvement in speech over past 12 hours. At present time she is lying comfortably in her bed, shows prolonged speech articulation, able to converse with me and follow all my commands.  She states her HA is a 8/10. During hospitalization patient has refused MRI brain and 2 D echo.   Past Medical History  Diagnosis Date  . Depression   . Anxiety   . Diabetes mellitus   . GERD (gastroesophageal reflux disease)   . Migraine   . ADD (attention deficit disorder)   . Hyperlipemia   . Herniated disc     Past Surgical History  Procedure Date  . Cholecystectomy   . Cesarean section   . Tonsillectomy     Family History  Problem Relation Age of Onset  . Diabetes type II      Social History:  reports that she has never smoked. She has never used smokeless tobacco. She reports that she does not drink alcohol or use illicit drugs.  Allergies  Allergen Reactions  . Atarax (Hydroxyzine Hcl) Anaphylaxis  . Cephalosporins Anaphylaxis  . Codeine Anaphylaxis  .  Compazine Anaphylaxis  . Triptans Anaphylaxis  . Decadron (Dexamethasone)   . Demerol   . Metoclopramide Hcl   . Morphine And Related   . Penicillins   . Sulfa Antibiotics   . Zofran     Medications:    Prior to Admission:  Prescriptions prior to admission  Medication Sig Dispense Refill  . amphetamine-dextroamphetamine (ADDERALL) 30 MG tablet Take 30 mg by mouth 2 (two) times daily. At 8am and 12pm for concentration      . atorvastatin (LIPITOR) 20 MG tablet Take 20 mg by mouth daily.      . cyclobenzaprine (FLEXERIL) 10 MG tablet Take 10 mg by mouth 2 (two) times daily as needed. For headaches      . fenofibrate micronized (LOFIBRA) 200 MG capsule Take 200 mg by mouth at bedtime.       . insulin aspart (NOVOLOG) 100 UNIT/ML injection Inject 8-12 Units into the skin 3 (three) times daily before meals. 8units breakfast 10units lunch 12 units dinnertime.      . insulin glargine (LANTUS) 100 UNIT/ML injection Inject 40 Units into the skin at bedtime.       . metFORMIN (GLUMETZA) 1000 MG (MOD) 24 hr tablet Take 1,500 mg by mouth every evening.       . nortriptyline (PAMELOR) 25 MG capsule Take 100 mg by mouth at bedtime.      Marland Kitchen  perphenazine (TRILAFON) 4 MG tablet Take 4 mg by mouth 3 (three) times daily.      . promethazine (PHENERGAN) 25 MG/ML injection Inject 25 mg into the vein every 4 (four) hours as needed. For nausea      . topiramate (TOPAMAX) 100 MG tablet Take 100 mg by mouth 2 (two) times daily.      . verapamil (CALAN) 40 MG tablet Take 60 mg by mouth 2 (two) times daily.       Scheduled:   . amphetamine-dextroamphetamine  30 mg Oral BID  . aspirin  300 mg Rectal Daily   Or  . aspirin  325 mg Oral Daily  . enoxaparin  40 mg Subcutaneous Q24H  .  HYDROmorphone (DILAUDID) injection  1 mg Intravenous Once  .  HYDROmorphone (DILAUDID) injection  2 mg Intravenous Once  . insulin aspart  0-15 Units Subcutaneous TID WC  . insulin aspart  0-5 Units Subcutaneous QHS  . insulin  glargine  40 Units Subcutaneous QHS  . metFORMIN  1,500 mg Oral QPM  . nortriptyline  100 mg Oral QHS  . perphenazine  4 mg Oral TID  . promethazine  25 mg Intravenous Once  . promethazine  6.25 mg Intravenous Once  . simvastatin  20 mg Oral Daily  . sodium chloride  3 mL Intravenous Q12H  . topiramate  100 mg Oral BID  . verapamil  60 mg Oral BID  . DISCONTD: aspirin  300 mg Rectal Daily  . DISCONTD: aspirin  325 mg Oral Daily    Blood pressure 111/65, pulse 105, temperature 97.7 F (36.5 C), temperature source Oral, resp. rate 19, height 5\' 7"  (1.702 m), weight 132.6 kg (292 lb 5.3 oz), SpO2 96.00%.   Neurologic Examination:  Mental Status: Alert, oriented, thought content appropriate.  Speech shows long drawn out formation of words without evidence of aphasia. Able to follow 3 step commands without difficulty. Cranial Nerves: II-Visual fields grossly intact. III/IV/VI-Extraocular movements intact.  Pupils reactive bilaterally. V/VII-no facial weakness when distracted. VIII-grossly intact --normal IX/X-normal gag XI-bilateral shoulder shrug intact but gives less effort on the left XII-midline tongue extension--will not give full effort to extend tongue Motor: 5/5 on right arm and leg with normal tone and bulk.  Will give no effort to move her left arm or leg.  Will not let her left hand hit face when held over her head and released. Shows positive hoover sign when asked to lift her left leg, strong left SCM on head turning to the right. Sensory: Pinprick and light touch intact throughout, bilaterally Deep Tendon Reflexes: 1+ and symmetric throughout Plantars: Downgoing bilaterally Cerebellar: Normal finger-to-nose and heel-to-shin tests with right extremities.     No results found for this basename: cbc, bmp, coags, chol, tri, ldl, hga1c    Results for orders placed during the hospital encounter of 07/27/11 (from the past 48 hour(s))  CBC     Status: Abnormal   Collection  Time   07/28/11  1:01 AM      Component Value Range Comment   WBC 12.1 (*) 4.0 - 10.5 (K/uL)    RBC 4.97  3.87 - 5.11 (MIL/uL)    Hemoglobin 13.0  12.0 - 15.0 (g/dL)    HCT 16.1  09.6 - 04.5 (%)    MCV 79.9  78.0 - 100.0 (fL)    MCH 26.2  26.0 - 34.0 (pg)    MCHC 32.7  30.0 - 36.0 (g/dL)    RDW 40.9  81.1 -  15.5 (%)    Platelets 260  150 - 400 (K/uL)   BASIC METABOLIC PANEL     Status: Abnormal   Collection Time   07/28/11  1:01 AM      Component Value Range Comment   Sodium 136  135 - 145 (mEq/L)    Potassium 3.8  3.5 - 5.1 (mEq/L)    Chloride 100  96 - 112 (mEq/L)    CO2 21  19 - 32 (mEq/L)    Glucose, Bld 146 (*) 70 - 99 (mg/dL)    BUN 10  6 - 23 (mg/dL)    Creatinine, Ser 1.61  0.50 - 1.10 (mg/dL)    Calcium 8.8  8.4 - 10.5 (mg/dL)    GFR calc non Af Amer >90  >90 (mL/min)    GFR calc Af Amer >90  >90 (mL/min)   CBC     Status: Abnormal   Collection Time   07/28/11  4:50 AM      Component Value Range Comment   WBC 10.4  4.0 - 10.5 (K/uL)    RBC 4.49  3.87 - 5.11 (MIL/uL)    Hemoglobin 11.9 (*) 12.0 - 15.0 (g/dL)    HCT 09.6  04.5 - 40.9 (%)    MCV 81.5  78.0 - 100.0 (fL)    MCH 26.5  26.0 - 34.0 (pg)    MCHC 32.5  30.0 - 36.0 (g/dL)    RDW 81.1  91.4 - 78.2 (%)    Platelets 225  150 - 400 (K/uL)   BASIC METABOLIC PANEL     Status: Abnormal   Collection Time   07/28/11  4:50 AM      Component Value Range Comment   Sodium 138  135 - 145 (mEq/L)    Potassium 3.6  3.5 - 5.1 (mEq/L)    Chloride 102  96 - 112 (mEq/L)    CO2 22  19 - 32 (mEq/L)    Glucose, Bld 144 (*) 70 - 99 (mg/dL)    BUN 10  6 - 23 (mg/dL)    Creatinine, Ser 9.56  0.50 - 1.10 (mg/dL)    Calcium 8.3 (*) 8.4 - 10.5 (mg/dL)    GFR calc non Af Amer >90  >90 (mL/min)    GFR calc Af Amer >90  >90 (mL/min)   TROPONIN I     Status: Normal   Collection Time   07/28/11  4:50 AM      Component Value Range Comment   Troponin I <0.30  <0.30 (ng/mL)   GLUCOSE, CAPILLARY     Status: Abnormal   Collection Time    07/28/11  7:37 AM      Component Value Range Comment   Glucose-Capillary 150 (*) 70 - 99 (mg/dL)   TROPONIN I     Status: Normal   Collection Time   07/28/11  9:20 AM      Component Value Range Comment   Troponin I <0.30  <0.30 (ng/mL)   URINE RAPID DRUG SCREEN (HOSP PERFORMED)     Status: Abnormal   Collection Time   07/28/11 11:13 AM      Component Value Range Comment   Opiates POSITIVE (*) NONE DETECTED     Cocaine NONE DETECTED  NONE DETECTED     Benzodiazepines NONE DETECTED  NONE DETECTED     Amphetamines NONE DETECTED  NONE DETECTED     Tetrahydrocannabinol NONE DETECTED  NONE DETECTED     Barbiturates NONE DETECTED  NONE DETECTED  GLUCOSE, CAPILLARY     Status: Abnormal   Collection Time   07/28/11 11:54 AM      Component Value Range Comment   Glucose-Capillary 161 (*) 70 - 99 (mg/dL)     Ct Head Wo Contrast  07/27/2011  *RADIOLOGY REPORT*  Clinical Data: Headache with left side paralysis.  CT HEAD WITHOUT CONTRAST  Technique:  Contiguous axial images were obtained from the base of the skull through the vertex without contrast.  Comparison: 05/06/2011  Findings: There is no evidence for acute hemorrhage, hydrocephalus, mass lesion, or abnormal extra-axial fluid collection.  No definite CT evidence of acute infarct.  Bone windows show the visualized portions of the paranasal sinuses to be clear.  IMPRESSION: Normal exam.  Original Report Authenticated By: ERIC A. MANSELL, M.D.     Assessment/Plan:   This is a 36 year old female with past medical history of migraine headaches and left hemiplegia. In addition she also has a past medical history of conversion disorder with similar presentation. Features on exam are consistent with nonphysiologic basis for neurological deficits.    Patient has refused MRI and 2 D echo. Patient has had Depakote in past (per notes) with good results at times and other times with little results.   Recommend:  1) Would not recommend further narcotics at  this time 2) Trial of IV VPA 500 mg, once. 3)  If no improvement trying a steroid taper, IV bolus 125 mg Solu-Medrol followed by 60 mg daily for 5 days, then decrease by 10 mg every other day until tapered off. 4. Repeat psychiatric evaluation.  Felicie Morn PA-C [facility] [phone]  Case discussed with, and patient examined by Dr. Noel Christmas, who agrees with assessment and recommendations.  07/28/2011, 1:59 PM

## 2011-07-28 NOTE — ED Notes (Signed)
Patient is resting comfortably. 

## 2011-07-28 NOTE — ED Notes (Signed)
Pt asking to speak to charge RN because she feels Md's are not treating her nicely. Pt says pain is still 9/10 despite pain medication. Pt awaiting a bed assignment

## 2011-07-28 NOTE — Progress Notes (Signed)
SLP  Note  Order for swallow evaluation received, upon review of progress note, MD ordered swallow screen by RN.  SLP spoke to RN and asked for her to place order if pt does not pass.  Thanks.  Angelica Burnet, MS [facility] SLP [phone]

## 2011-07-28 NOTE — Progress Notes (Signed)
   CARE MANAGEMENT NOTE 07/28/2011  Patient:  [name],[name]   Account Number:  [id number]  Date Initiated:  07/28/2011  Documentation initiated by:  Lanier Clam  Subjective/Objective Assessment:   ADMITTED W/MIGRAINE,L SIDE HEMIPLEGIA.     Action/Plan:   FROM HOME W/FAMILY.   Anticipated DC Date:  07/31/2011   Anticipated DC Plan:  HOME W HOME HEALTH SERVICES         Choice offered to / List presented to:  C-1 Patient           Status of service:  In process, will continue to follow Medicare Important Message given?   (If response is "NO", the following Medicare IM given date fields will be blank) Date Medicare IM given:   Date Additional Medicare IM given:    Discharge Disposition:    Per UR Regulation:  Reviewed for med. necessity/level of care/duration of stay  If discussed at Long Length of Stay Meetings, dates discussed:    Comments:  07/28/11 Maria Gallicchio RN,BSN NCM [phone] PT-HH.[facility] AGENCY LIST PROVIDED.

## 2011-07-28 NOTE — Progress Notes (Signed)
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 The patient presented to the emergency room with left-sided hemiplegia slurred speech and difficulty swallowing per the patient's admission. The patient states that her symptoms this time are more intense than they have been in the past. The patient also volunteered that she had a history of stroke in her family which included her grandfather on her mother side. In part of the patient if her symptoms are the same as before and she really rated that she felt that they were worse than they have been in the past associated with her migraines. I explained to the patient that while all her symptoms may be attributed to her complicated migraine the presentation of the hemiplegia was most consistent with a CVA and had an MRI needs to be done to rule out a CVA. The patient was agreeable to this. Additionally, patient reports he headache pain as 6/10 at present. We discussed the contraindication in use of Dilaudid as treatment for her headaches. I explained to the patient that Dilaudid causes vasodilation which would increase the intensity of her headaches. I inquired the patient as to whether or not she used Fioricet in the past and she said that she had used Fioricet which was effective.  Objective: Filed Vitals:   07/28/11 0355 07/28/11 0552 07/28/11 1100 07/28/11 1450  BP: 120/70 111/65  118/76  Pulse: 104 105  95  Temp: 98.8 F (37.1 C) 98 F (36.7 C) 97.7 F (36.5 C) 98.3 F (36.8 C)  TempSrc: Oral Oral  Oral  Resp: 20 19  20   Height: 5\' 7"  (1.702 m)     Weight: 132.6 kg (292 lb 5.3 oz)     SpO2: 95% 96%  92%   Weight change:   Intake/Output Summary (Last 24 hours) at 07/28/11 1628 Last data filed at 07/28/11 1119  Gross per 24 hour  Intake    280 ml  Output    450 ml  Net   -170 ml    General: Alert, awake, oriented x3, in no acute distress. Speech slurred. However  patient has just underwent a swallow screening she has passed without any difficulty HEENT: [place]/AT PEERL, EOMI Neck: Trachea midline,  no masses, no thyromegal,y no JVD, no carotid bruit OROPHARYNX:  Moist, No exudate/ erythema/lesions.  Heart: Regular rate and rhythm, without murmurs, rubs, gallops, PMI non-displaced, no heaves or thrills on palpation.  Lungs: Clear to auscultation, no wheezing or rhonchi noted. No increased vocal fremitus resonant to percussion  Abdomen: Soft, nontender, nondistended, positive bowel sounds, no masses no hepatosplenomegaly noted..  Neuro: Patient presents with a left-sided hemiplegia. The patient has some tone in the left upper extremity and does not appear to be altogether flaccid. The patient is able to maintain enough to prevent her arm from falling when raised into the air. She has weakness of the left lower extremity. And is unable to move her left leg against gravity even what appears to be maximal effort. However the patient has no increase in tone or significant decrease in tone of the left lower extremity. DTRs are 2+ in the left lower extremities Musculoskeletal: No warm swelling or erythema around joints, no spinal tenderness noted. Psychiatric: Patient alert and oriented x 3 she is logical her thought process unable to answer questions accurately.   Lab Results:  Basename 07/28/11 0450 07/28/11 0101  NA 138 136  K 3.6 3.8  CL 102 100  CO2 22 21  GLUCOSE 144* 146*  BUN 10 10  CREATININE 0.63 0.59  CALCIUM 8.3* 8.8  MG -- --  PHOS -- --   No results found for this basename: AST:2,ALT:2,ALKPHOS:2,BILITOT:2,PROT:2,ALBUMIN:2 in the last 72 hours No results found for this basename: LIPASE:2,AMYLASE:2 in the last 72 hours  Basename 07/28/11 0450 07/28/11 0101  WBC 10.4 12.1*  NEUTROABS -- --  HGB 11.9* 13.0  HCT 36.6 39.7  MCV 81.5 79.9  PLT 225 260    Basename 07/28/11 0920 07/28/11 0450  CKTOTAL -- --  CKMB -- --  CKMBINDEX -- --    TROPONINI <0.30 <0.30   No components found with this basename: POCBNP:3 No results found for this basename: DDIMER:2 in the last 72 hours No results found for this basename: HGBA1C:2 in the last 72 hours No results found for this basename: CHOL:2,HDL:2,LDLCALC:2,TRIG:2,CHOLHDL:2,LDLDIRECT:2 in the last 72 hours No results found for this basename: TSH,T4TOTAL,FREET3,T3FREE,THYROIDAB in the last 72 hours No results found for this basename: VITAMINB12:2,FOLATE:2,FERRITIN:2,TIBC:2,IRON:2,RETICCTPCT:2 in the last 72 hours  Micro Results: No results found for this or any previous visit (from the past 240 hour(s)).  Studies/Results: Ct Head Wo Contrast  07/27/2011  *RADIOLOGY REPORT*  Clinical Data: Headache with left side paralysis.  CT HEAD WITHOUT CONTRAST  Technique:  Contiguous axial images were obtained from the base of the skull through the vertex without contrast.  Comparison: 05/06/2011  Findings: There is no evidence for acute hemorrhage, hydrocephalus, mass lesion, or abnormal extra-axial fluid collection.  No definite CT evidence of acute infarct.  Bone windows show the visualized portions of the paranasal sinuses to be clear.  IMPRESSION: Normal exam.  Original Report Authenticated By: ERIC A. MANSELL, M.D.    Medications: I have reviewed the patient's current medications. Scheduled Meds:   . amphetamine-dextroamphetamine  30 mg Oral BID  . aspirin  300 mg Rectal Daily   Or  . aspirin  325 mg Oral Daily  . atorvastatin  20 mg Oral q1800  . enoxaparin  40 mg Subcutaneous Q24H  .  HYDROmorphone (DILAUDID) injection  1 mg Intravenous Once  .  HYDROmorphone (DILAUDID) injection  2 mg Intravenous Once  . insulin aspart  0-15 Units Subcutaneous TID WC  . insulin aspart  0-5 Units Subcutaneous QHS  . insulin glargine  40 Units Subcutaneous QHS  . metFORMIN  1,500 mg Oral QPM  . nortriptyline  100 mg Oral QHS  . perphenazine  4 mg Oral TID  . promethazine  25 mg Intravenous Once  .  promethazine  6.25 mg Intravenous Once  . sodium chloride  3 mL Intravenous Q12H  . topiramate  100 mg Oral BID  . verapamil  60 mg Oral BID  . DISCONTD: aspirin  300 mg Rectal Daily  . DISCONTD: aspirin  325 mg Oral Daily  . DISCONTD: simvastatin  20 mg Oral Daily   Continuous Infusions:   . 0.9 % NaCl with KCl 20 mEq / L 100 mL/hr at 07/28/11 1410   PRN Meds:.butalbital-acetaminophen-caffeine, cyclobenzaprine, promethazine, senna-docusate, DISCONTD:  HYDROmorphone (DILAUDID) injection Assessment/Plan: Patient Active Hospital Problem List: Hemiplegiaaffecting left nondominant side (07/28/2011)   Assessment: Patient presents with left-sided hemiplegia. Although all of these symptoms could very well be attributed to her hemiplegic migraines, CVA cannot be entirely ruled out. I have reviewed patient's records and her last MRI in our system was done in 02/06/2010. I think it is reasonable  that we offer patient an MRI to R/O CVA.   Plan: I  have asked Neurology to see patient in consultation and make further recommendations.  Conversion disorder (03/10/2011)   Assessment: This should be a diagnosis of exclusion and presently pt has not exhibited any signs of conversion reaction.   Diabetes type 2, uncontrolled (03/10/2011)   Assessment: Blood sugars are presently well controlled. Continue metformin and SSI.   Hemiplegic Migraine (07/28/2011)   Assessment: Pt has a h/o hemiplegic migraine and is rating her pain as 6/10. I have discussed with patient the contraindication of narcotic use in Migraines as it causes vasodilatation and worsening of headache. I have offered Fioricet as an alternative and patient agrees.    Plan: Will discontinue IV dilaudid   LOS: 1 day

## 2011-07-29 ENCOUNTER — Inpatient Hospital Stay (HOSPITAL_COMMUNITY): Payer: BC Managed Care – PPO

## 2011-07-29 DIAGNOSIS — F341 Dysthymic disorder: Secondary | ICD-10-CM

## 2011-07-29 DIAGNOSIS — G43809 Other migraine, not intractable, without status migrainosus: Secondary | ICD-10-CM

## 2011-07-29 DIAGNOSIS — G8190 Hemiplegia, unspecified affecting unspecified side: Secondary | ICD-10-CM

## 2011-07-29 DIAGNOSIS — I6789 Other cerebrovascular disease: Secondary | ICD-10-CM | POA: Diagnosis not present

## 2011-07-29 LAB — GLUCOSE, CAPILLARY
Glucose-Capillary: 140 mg/dL — ABNORMAL HIGH (ref 70–99)
Glucose-Capillary: 175 mg/dL — ABNORMAL HIGH (ref 70–99)

## 2011-07-29 LAB — LIPID PANEL
LDL Cholesterol: 65 mg/dL (ref 0–99)
Triglycerides: 244 mg/dL — ABNORMAL HIGH (ref <150)
VLDL: 49 mg/dL — ABNORMAL HIGH (ref 0–40)

## 2011-07-29 NOTE — Evaluation (Signed)
Occupational Therapy Evaluation Patient Details Name: [name] MRN: [id number] DOB: Mar 19, 1976 Today's Date: 07/29/2011 Time: 4098-1191 OT Time Calculation (min): 28 min  OT Assessment / Plan / Recommendation Clinical Impression  Pt admitted with L hemiplegia in the setting of migraine.  Pt feels her symptoms will improve by the time she goes home.  Will follow acutely,  do not anticipate post hospital therapy needs.  Pt is declining any DME.    OT Assessment  Patient needs continued OT Services    Follow Up Recommendations  No OT follow up;Supervision/Assistance - 24 hour    Equipment Recommendations  None recommended by OT    Frequency Min 2X/week    Precautions / Restrictions Precautions Precautions: Fall Restrictions Weight Bearing Restrictions: No   Pertinent Vitals/Pain 8/10 HA    ADL  Eating/Feeding: Performed;Modified independent;Other (comment) (uses mouth to open packaging) Where Assessed - Eating/Feeding: Bed level Grooming: Performed;Wash/dry hands;Set up Where Assessed - Grooming: Unsupported sitting Upper Body Bathing: Simulated;Moderate assistance Where Assessed - Upper Body Bathing: Sitting, bed Lower Body Bathing: Simulated;+1 Total assistance Where Assessed - Lower Body Bathing: Sitting, bed;Sit to stand from bed Upper Body Dressing: Moderate assistance;Performed Where Assessed - Upper Body Dressing: Sitting, bed Lower Body Dressing: Performed;+1 Total assistance Where Assessed - Lower Body Dressing: Sitting, bed;Sit to stand from bed Equipment Used: Gait belt Ambulation Related to ADLs: Pt stated she was unable to ambulate. Able to take weight on her L LE although unable to move it in bed. ADL Comments: Instructed pt in hemi techniques for ADL.  Instructed pt to incorporate use of her L UE as much as possible and perform self/AAROM in bed or chair.    OT Goals Acute Rehab OT Goals OT Goal Formulation: With patient Time For Goal Achievement:  08/12/11 Potential to Achieve Goals: Good ADL Goals Pt Will Perform Grooming: with supervision;Standing at sink ADL Goal: Grooming - Progress: Goal set today Pt Will Perform Upper Body Bathing: with set-up;Sitting, edge of bed ADL Goal: Upper Body Bathing - Progress: Goal set today Pt Will Perform Lower Body Bathing: with supervision;Sitting, edge of bed;Sit to stand from bed ADL Goal: Lower Body Bathing - Progress: Goal set today Pt Will Perform Upper Body Dressing: with set-up;Sitting, bed ADL Goal: Upper Body Dressing - Progress: Goal set today Pt Will Perform Lower Body Dressing: Sitting, bed;Sit to stand from bed;with supervision ADL Goal: Lower Body Dressing - Progress: Goal set today Pt Will Transfer to Toilet: with supervision;Ambulation;Regular height toilet ADL Goal: Toilet Transfer - Progress: Goal set today Pt Will Perform Toileting - Clothing Manipulation: with supervision;Standing ADL Goal: Toileting - Clothing Manipulation - Progress: Goal set today Pt Will Perform Toileting - Hygiene: Independently;Sitting on 3-in-1 or toilet ADL Goal: Toileting - Hygiene - Progress: Goal set today Pt Will Perform Tub/Shower Transfer: Tub transfer;Ambulation;with supervision ADL Goal: Tub/Shower Transfer - Progress: Goal set today Miscellaneous OT Goals Miscellaneous OT Goal #1: Pt will be independent in L UE AROM/AAROM home exercise program. OT Goal: Miscellaneous Goal #1 - Progress: Goal set today  Visit Information  Last OT Received On: 07/29/11 Assistance Needed: +1    Subjective Data  Subjective: "I think I will be fine by the time I get home." Patient Stated Goal: Regain use of L side.   Prior Functioning  Home Living Lives With: Spouse;Daughter (3 children under 10) Available Help at Discharge: Family;Other (Comment) (mother in town to assist pt) Type of Home: Apartment Home Access: Stairs to enter [employer] of Steps:  1  flight Entrance Stairs-Rails:  Right Home Layout: One level Bathroom Shower/Tub: Engineer, manufacturing systems: Standard Home Adaptive Equipment: Walker - rolling;Other (comment) (tputty, tband) Prior Function Level of Independence: Independent Able to Take Stairs?: Yes Driving: Yes Communication Communication: No difficulties Dominant Hand: Right    Cognition  Overall Cognitive Status: Appears within functional limits for tasks assessed/performed Arousal/Alertness: Awake/alert Orientation Level: Appears intact for tasks assessed Behavior During Session: [facility] for tasks performed    Extremity/Trunk Assessment Right Upper Extremity Assessment RUE ROM/Strength/Tone: Within functional levels RUE Coordination: WFL - gross/fine motor Left Upper Extremity Assessment LUE ROM/Strength/Tone: Deficits LUE ROM/Strength/Tone Deficits: tone WFL, unable to move upon command, but used spontaneously for pushing into sitting and maintained once placed on her head. LUE Sensation: Deficits LUE Sensation Deficits: per her report, unable to feel her L arm.   LUE Coordination: Deficits LUE Coordination Deficits: no functional use  Trunk Assessment Trunk Assessment: Normal   Mobility Bed Mobility Supine to Sit: 3: Mod assist Sit to Supine: 3: Mod assist Details for Bed Mobility Assistance: Increased time. Assist for L LE onto/off bed. Assist for trunk to upright. Transfers Transfers: Sit to Stand;Stand to Sit Sit to Stand: 4: Min assist;From bed;With upper extremity assist       Balance Static Sitting Balance Static Sitting - Balance Support: Feet supported Static Sitting - Level of Assistance: 5: Stand by assistance  End of Session OT - End of Session Equipment Utilized During Treatment: Gait belt Activity Tolerance: Other (comment) (limited by nausea) Patient left: in bed;with call bell/phone within reach Nurse Communication: Other (comment) (pts performance)   Evern Bio 07/29/2011, 3:24 PM

## 2011-07-29 NOTE — Progress Notes (Signed)
Subjective: Lying in bed eyes closed. Easily aroused to verbal stimuli. Reports headache "a little better". State that it take 3-4 days for the hemiplegia to resolve.   Objective: Vital signs Filed Vitals:   07/28/11 1450 07/28/11 2032 07/29/11 0526 07/29/11 0933  BP: 118/76 111/71 115/77 113/70  Pulse: 95 86 91 93  Temp: 98.3 F (36.8 C) 97.8 F (36.6 C) 98.6 F (37 C) 97.7 F (36.5 C)  TempSrc: Oral Oral Oral Oral  Resp: 20 19 19 20   Height:      Weight:      SpO2: 92% 97% 97% 95%   Weight change:  Last BM Date: 07/28/11  Intake/Output from previous day: 05/07 0701 - 05/08 0700 In: 2701.7 [P.O.:360; I.V.:2286.7; IV Piggyback:55] Out: 3250 [Urine:3250] Total I/O In: 240 [P.O.:240] Out: 400 [Urine:400]   Physical Exam: General: Drowsy but, oriented x3, in no acute distress. HEENT: No bruits, no goiter. Mucus membranes mouth moist/pink.  Heart: Regular rate and rhythm, without murmurs, rubs, gallops. Lungs:  Normal effort. Breath sounds clear to auscultation bilaterally. No wheeze, rhonchi Abdomen:Obese,  Soft, nontender, nondistended, positive bowel sounds. Extremities: No clubbing cyanosis or edema with positive pedal pulses. Neuro: Speech slow, deliberate. No left grip, will not let her left hand hit face when held over hand and released. 5/5 strength on right.     Lab Results: Basic Metabolic Panel:  Basename 07/28/11 0450 07/28/11 0101  NA 138 136  K 3.6 3.8  CL 102 100  CO2 22 21  GLUCOSE 144* 146*  BUN 10 10  CREATININE 0.63 0.59  CALCIUM 8.3* 8.8  MG -- --  PHOS -- --   Liver Function Tests:  Basename 07/28/11 1558  AST 23  ALT 24  ALKPHOS 63  BILITOT 0.4  PROT 7.0  ALBUMIN 3.8   No results found for this basename: LIPASE:2,AMYLASE:2 in the last 72 hours No results found for this basename: AMMONIA:2 in the last 72 hours CBC:  Basename 07/28/11 0450 07/28/11 0101  WBC 10.4 12.1*  NEUTROABS -- --  HGB 11.9* 13.0  HCT 36.6 39.7  MCV  81.5 79.9  PLT 225 260   Cardiac Enzymes:  Basename 07/28/11 1555 07/28/11 0920 07/28/11 0450  CKTOTAL -- -- --  CKMB -- -- --  CKMBINDEX -- -- --  TROPONINI <0.30 <0.30 <0.30   BNP: No results found for this basename: PROBNP:3 in the last 72 hours D-Dimer: No results found for this basename: DDIMER:2 in the last 72 hours CBG:  Basename 07/29/11 0726 07/28/11 2142 07/28/11 1639 07/28/11 1154 07/28/11 0737  GLUCAP 140* 131* 174* 161* 150*   Hemoglobin A1C: No results found for this basename: HGBA1C in the last 72 hours Fasting Lipid Panel:  Basename 07/29/11 0435  CHOL 146  HDL 32*  LDLCALC 65  TRIG 478*  CHOLHDL 4.6  LDLDIRECT --   Thyroid Function Tests: No results found for this basename: TSH,T4TOTAL,FREET4,T3FREE,THYROIDAB in the last 72 hours Anemia Panel: No results found for this basename: VITAMINB12,FOLATE,FERRITIN,TIBC,IRON,RETICCTPCT in the last 72 hours Coagulation: No results found for this basename: LABPROT:2,INR:2 in the last 72 hours Urine Drug Screen: Drugs of Abuse     Component Value Date/Time   LABOPIA POSITIVE* 07/28/2011 1113   COCAINSCRNUR NONE DETECTED 07/28/2011 1113   LABBENZ NONE DETECTED 07/28/2011 1113   AMPHETMU NONE DETECTED 07/28/2011 1113   THCU NONE DETECTED 07/28/2011 1113   LABBARB NONE DETECTED 07/28/2011 1113    Alcohol Level: No results found for this basename: ETH:2 in  the last 72 hours Urinalysis: No results found for this basename: COLORURINE:2,APPERANCEUR:2,LABSPEC:2,PHURINE:2,GLUCOSEU:2,HGBUR:2,BILIRUBINUR:2,KETONESUR:2,PROTEINUR:2,UROBILINOGEN:2,NITRITE:2,LEUKOCYTESUR:2 in the last 72 hours Misc. Labs:  No results found for this or any previous visit (from the past 240 hour(s)).  Studies/Results: Dg Chest 2 View  07/29/2011  *RADIOLOGY REPORT*  Clinical Data: Chest pain.  CHEST - 2 VIEW  Comparison: Chest radiograph performed 05/08/2010  Findings: The lungs are well-aerated.  There is no evidence of focal opacification, pleural  effusion or pneumothorax.  Minimally increased markings at the left lung base are thought to reflect overlying soft tissues.  The heart is normal in size; the mediastinal contour is within normal limits.  No acute osseous abnormalities are seen.  IMPRESSION: No acute cardiopulmonary process seen.  Original Report Authenticated By: Tonia Ghent, M.D.   Ct Head Wo Contrast  07/27/2011  *RADIOLOGY REPORT*  Clinical Data: Headache with left side paralysis.  CT HEAD WITHOUT CONTRAST  Technique:  Contiguous axial images were obtained from the base of the skull through the vertex without contrast.  Comparison: 05/06/2011  Findings: There is no evidence for acute hemorrhage, hydrocephalus, mass lesion, or abnormal extra-axial fluid collection.  No definite CT evidence of acute infarct.  Bone windows show the visualized portions of the paranasal sinuses to be clear.  IMPRESSION: Normal exam.  Original Report Authenticated By: ERIC A. MANSELL, M.D.    Medications: Scheduled Meds:   . amphetamine-dextroamphetamine  30 mg Oral BID  . aspirin  300 mg Rectal Daily   Or  . aspirin  325 mg Oral Daily  . atorvastatin  20 mg Oral q1800  . enoxaparin  40 mg Subcutaneous Q24H  . insulin aspart  0-15 Units Subcutaneous TID WC  . insulin aspart  0-5 Units Subcutaneous QHS  . insulin glargine  40 Units Subcutaneous QHS  . metFORMIN  1,500 mg Oral QPM  . nortriptyline  100 mg Oral QHS  . perphenazine  4 mg Oral TID  . promethazine  6.25 mg Intravenous Once  . sodium chloride  3 mL Intravenous Q12H  . topiramate  100 mg Oral BID  . valproate sodium  500 mg Intravenous Once  . verapamil  60 mg Oral BID  . DISCONTD: aspirin  300 mg Rectal Daily  . DISCONTD: aspirin  325 mg Oral Daily  . DISCONTD: simvastatin  20 mg Oral Daily   Continuous Infusions:   . 0.9 % NaCl with KCl 20 mEq / L 100 mL/hr at 07/29/11 0045   PRN Meds:.butalbital-acetaminophen-caffeine, cyclobenzaprine, promethazine, senna-docusate,  DISCONTD:  HYDROmorphone (DILAUDID) injection, DISCONTD: promethazine  Assessment/Plan: 1-Hemiplegic migraine: improving; according to patient symptoms normally resolve in 3-4 days. At this point will continue fioricet and if pain persist will use tapering steroids as recommended by neurology. Avoid narcotics.  2-Conversion disorder: hx of; will continue treatment as above; symptoms improving. Avoid narcotics. No second gain identified at this point.  3-Diabetes type 2, uncontrolled:continue SSI, lantus and metformin. A1C 8.5  4-Chest pain: non-cardiac in origin; CE'z negative X 3; no telemetry abnormality and no ischemic process on EKG. Most likely 2/2 GERD.  5-Hemiplegia affecting left nondominant side: as above; secondary to migraine.  6-HLD: continue statins.  7-DVT: continue lovenox.  Dispo: home tomorrow.   LOS: 2 days   Suki Crockett [facility] [phone]  07/29/2011, 11:30 PM

## 2011-07-29 NOTE — Progress Notes (Signed)
SLP Eval Cancellation Note  Evaluation cancelled today due to patient receiving procedure or test, was receiving ECHO when SLP attempted eval earlier and now is eating dinner,  MD is in room with pt currently.  SLP to return next date as ordered.  Thanks.  Donavan Burnet, MS [facility] SLP [phone]

## 2011-07-29 NOTE — Progress Notes (Signed)
*  PRELIMINARY RESULTS* Echocardiogram 2D Echocardiogram has been performed.  [name] 07/29/2011, 11:58 AM

## 2011-07-30 ENCOUNTER — Ambulatory Visit (INDEPENDENT_AMBULATORY_CARE_PROVIDER_SITE_OTHER): Payer: BC Managed Care – PPO | Admitting: General Surgery

## 2011-07-30 DIAGNOSIS — G43809 Other migraine, not intractable, without status migrainosus: Secondary | ICD-10-CM

## 2011-07-30 DIAGNOSIS — G8190 Hemiplegia, unspecified affecting unspecified side: Secondary | ICD-10-CM

## 2011-07-30 DIAGNOSIS — F341 Dysthymic disorder: Secondary | ICD-10-CM

## 2011-07-30 LAB — GLUCOSE, CAPILLARY
Glucose-Capillary: 117 mg/dL — ABNORMAL HIGH (ref 70–99)
Glucose-Capillary: 186 mg/dL — ABNORMAL HIGH (ref 70–99)

## 2011-07-30 MED ORDER — SENNOSIDES-DOCUSATE SODIUM 8.6-50 MG PO TABS: 1.0000 | ORAL_TABLET | Freq: Every evening | ORAL | Status: DC | PRN

## 2011-07-30 MED ORDER — PANTOPRAZOLE SODIUM 40 MG PO TBEC: 40.0000 mg | DELAYED_RELEASE_TABLET | Freq: Every day | ORAL | Status: DC

## 2011-07-30 MED ORDER — BUTALBITAL-APAP-CAFFEINE 50-325-40 MG PO TABS: 2.0000 | ORAL_TABLET | Freq: Four times a day (QID) | ORAL | Status: AC | PRN

## 2011-07-30 NOTE — Progress Notes (Signed)
SLP Cancellation Note  SLP received SLP evaluation cancellation order from MD, please reorder if desire. Thanks.  Donavan Burnet, MS [facility] SLP [phone]

## 2011-07-30 NOTE — Care Management Note (Signed)
    Page 1 of 1   07/30/2011     10:54:53 AM   CARE MANAGEMENT NOTE 07/30/2011  Patient:  AMMA, CREAR   Account Number:  [id number]  Date Initiated:  07/28/2011  Documentation initiated by:  Lanier Clam  Subjective/Objective Assessment:   ADMITTED W/MIGRAINE,L SIDE HEMIPLEGIA.     Action/Plan:   FROM HOME W/FAMILY.HAS RW.   Anticipated DC Date:  07/30/2011   Anticipated DC Plan:  HOME W HOME HEALTH SERVICES      DC Planning Services  CM consult      Choice offered to / List presented to:  C-1 Patient        HH arranged  HH-2 PT      [facility] agency  [facility]   Status of service:  Completed, signed off Medicare Important Message given?   (If response is "NO", the following Medicare IM given date fields will be blank) Date Medicare IM given:   Date Additional Medicare IM given:    Discharge Disposition:  HOME W HOME HEALTH SERVICES  Per UR Regulation:  Reviewed for med. necessity/level of care/duration of stay  If discussed at Long Length of Stay Meetings, dates discussed:    Comments:  07/30/11 Oneda Duffett RN,BSN NCM [phone] CONTACTED [facility]E LIASON) FOLLOWING FOR HHPT.  07/28/11 Anelia Carriveau RN,BSN NCM [phone] [facility] CHOSEN.[facility]BBIE(LIASON) FOLLOWING FOR D/C. PT-HH.HHC AGENCY LIST PROVIDED.
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
## 2011-07-30 NOTE — Discharge Instructions (Addendum)

## 2011-07-30 NOTE — Progress Notes (Signed)
Pt verbalized understanding of discharge instructions.  Pt assessment has not changed from am.  Pt was given prescriptions and d/c forms. 

## 2011-07-30 NOTE — Progress Notes (Addendum)
Subjective: Awake, reports "feeling better". No events during night.   Objective: Vital signs Filed Vitals:   07/29/11 1352 07/29/11 1800 07/29/11 2109 07/30/11 0539  BP: 101/69 110/75 128/85 114/78  Pulse: 90 92 91 83  Temp: 97.7 F (36.5 C) 97.7 F (36.5 C) 98 F (36.7 C) 97.9 F (36.6 C)  TempSrc: Oral Oral Oral Oral  Resp: 18 19 18 18   Height:      Weight:      SpO2: 98% 97% 99% 98%   Weight change:  Last BM Date: 07/28/11  Intake/Output from previous day: 05/08 0701 - 05/09 0700 In: 1730 [P.O.:1080; I.V.:650] Out: 3150 [Urine:3150]     Physical Exam: General: Alert, awake, oriented x3, in no acute distress. HEENT: No bruits, no goiter. Heart: Regular rate and rhythm, without murmurs, rubs, gallops. Lungs: Normal effort. Breath sounds clear to auscultation bilaterally. No wheeze Abdomen:Obese Soft, nontender, nondistended, positive bowel sounds. Extremities: No clubbing cyanosis or edema with positive pedal pulses. Neuro: Grossly intact, nonfocal. Speech clear.    Lab Results: Basic Metabolic Panel:  Basename 07/28/11 0450 07/28/11 0101  NA 138 136  K 3.6 3.8  CL 102 100  CO2 22 21  GLUCOSE 144* 146*  BUN 10 10  CREATININE 0.63 0.59  CALCIUM 8.3* 8.8  MG -- --  PHOS -- --   Liver Function Tests:  Basename 07/28/11 1558  AST 23  ALT 24  ALKPHOS 63  BILITOT 0.4  PROT 7.0  ALBUMIN 3.8   No results found for this basename: LIPASE:2,AMYLASE:2 in the last 72 hours No results found for this basename: AMMONIA:2 in the last 72 hours CBC:  Basename 07/28/11 0450 07/28/11 0101  WBC 10.4 12.1*  NEUTROABS -- --  HGB 11.9* 13.0  HCT 36.6 39.7  MCV 81.5 79.9  PLT 225 260   Cardiac Enzymes:  Basename 07/28/11 1555 07/28/11 0920 07/28/11 0450  CKTOTAL -- -- --  CKMB -- -- --  CKMBINDEX -- -- --  TROPONINI <0.30 <0.30 <0.30   BNP: No results found for this basename: PROBNP:3 in the last 72 hours D-Dimer: No results found for this basename:  DDIMER:2 in the last 72 hours CBG:  Basename 07/30/11 0716 07/29/11 2111 07/29/11 1636 07/29/11 1144 07/29/11 0726 07/28/11 2142  GLUCAP 117* 175* 107* 151* 140* 131*   Hemoglobin A1C:  Basename 07/29/11 0435  HGBA1C 8.5*   Fasting Lipid Panel:  Basename 07/29/11 0435  CHOL 146  HDL 32*  LDLCALC 65  TRIG 914*  CHOLHDL 4.6  LDLDIRECT --   Thyroid Function Tests: No results found for this basename: TSH,T4TOTAL,FREET4,T3FREE,THYROIDAB in the last 72 hours Anemia Panel: No results found for this basename: VITAMINB12,FOLATE,FERRITIN,TIBC,IRON,RETICCTPCT in the last 72 hours Coagulation: No results found for this basename: LABPROT:2,INR:2 in the last 72 hours Urine Drug Screen: Drugs of Abuse     Component Value Date/Time   LABOPIA POSITIVE* 07/28/2011 1113   COCAINSCRNUR NONE DETECTED 07/28/2011 1113   LABBENZ NONE DETECTED 07/28/2011 1113   AMPHETMU NONE DETECTED 07/28/2011 1113   THCU NONE DETECTED 07/28/2011 1113   LABBARB NONE DETECTED 07/28/2011 1113    Alcohol Level: No results found for this basename: ETH:2 in the last 72 hours Urinalysis: No results found for this basename: COLORURINE:2,APPERANCEUR:2,LABSPEC:2,PHURINE:2,GLUCOSEU:2,HGBUR:2,BILIRUBINUR:2,KETONESUR:2,PROTEINUR:2,UROBILINOGEN:2,NITRITE:2,LEUKOCYTESUR:2 in the last 72 hours Misc. Labs:  No results found for this or any previous visit (from the past 240 hour(s)).  Studies/Results: Dg Chest 2 View  07/29/2011  *RADIOLOGY REPORT*  Clinical Data: Chest pain.  CHEST - 2  VIEW  Comparison: Chest radiograph performed 05/08/2010  Findings: The lungs are well-aerated.  There is no evidence of focal opacification, pleural effusion or pneumothorax.  Minimally increased markings at the left lung base are thought to reflect overlying soft tissues.  The heart is normal in size; the mediastinal contour is within normal limits.  No acute osseous abnormalities are seen.  IMPRESSION: No acute cardiopulmonary process seen.  Original  Report Authenticated By: Tonia Ghent, M.D.    Medications: Scheduled Meds:   . amphetamine-dextroamphetamine  30 mg Oral BID  . aspirin  300 mg Rectal Daily   Or  . aspirin  325 mg Oral Daily  . atorvastatin  20 mg Oral q1800  . enoxaparin  40 mg Subcutaneous Q24H  . insulin aspart  0-15 Units Subcutaneous TID WC  . insulin aspart  0-5 Units Subcutaneous QHS  . insulin glargine  40 Units Subcutaneous QHS  . metFORMIN  1,500 mg Oral QPM  . nortriptyline  100 mg Oral QHS  . perphenazine  4 mg Oral TID  . sodium chloride  3 mL Intravenous Q12H  . topiramate  100 mg Oral BID  . verapamil  60 mg Oral BID   Continuous Infusions:   . 0.9 % NaCl with KCl 20 mEq / L 10 mL/hr (07/29/11 1211)   PRN Meds:.butalbital-acetaminophen-caffeine, cyclobenzaprine, promethazine, senna-docusate  Assessment/Plan:  Active Problems:  Hemiplegic migraine  Conversion disorder  Diabetes type 2, uncontrolled  Chest pain  Hemiplegia affecting left nondominant side 1-Hemiplegic migraine: Much improved. Pt reports being ready to go home. Will continue fioricet. Avoid narcotics.  2-Conversion disorder: hx of; will continue treatment as above; symptoms improving. Avoid narcotics. No second gain identified at this point.  3-Diabetes type 2, uncontrolled:continue SSI, lantus and metformin. A1C 8.5  4-Chest pain: non-cardiac in origin; CE'z negative X 3; no telemetry abnormality and no ischemic process on EKG. Most likely 2/2 GERD.  5-Hemiplegia affecting left nondominant side: as above; secondary to migraine. 2decho with EF 55-60%, carotid dopplars without significant stenosis. Working with PT/OT.  6-HLD: continue statins.  7. Likely discharge this afternoon.      LOS: 3 days   [name] [phone] 07/30/2011, 9:20 AM

## 2011-07-30 NOTE — Discharge Summary (Signed)
Physician Discharge Summary  Patient ID: [name] MRN: [id number] DOB/AGE: [birth year] 36 y.o.  Admit date: 07/27/2011 Discharge date: 07/30/2011  Primary Care Physician:  Berenda Morale, MD, MD   Discharge Diagnoses:   1-Hemiplegic migraine 2-Conversion disorder 3-Diabetes type 2, uncontrolled 4-Chest pain (non-cardiac) 5-Hemiplegia affecting left nondominant side 6-HLD   Medication List  As of 07/30/2011 10:30 AM   STOP taking these medications         promethazine 25 MG/ML injection         TAKE these medications         amphetamine-dextroamphetamine 30 MG tablet   Commonly known as: ADDERALL   Take 30 mg by mouth 2 (two) times daily. At 8am and 12pm for concentration      atorvastatin 20 MG tablet   Commonly known as: LIPITOR   Take 20 mg by mouth daily.      butalbital-acetaminophen-caffeine 50-325-40 MG per tablet   Commonly known as: FIORICET, ESGIC   Take 2 tablets by mouth every 6 (six) hours as needed for headache.      cyclobenzaprine 10 MG tablet   Commonly known as: FLEXERIL   Take 10 mg by mouth 2 (two) times daily as needed. For headaches      fenofibrate micronized 200 MG capsule   Commonly known as: LOFIBRA   Take 200 mg by mouth at bedtime.      insulin aspart 100 UNIT/ML injection   Commonly known as: novoLOG   Inject 8-12 Units into the skin 3 (three) times daily before meals. 8units breakfast 10units lunch 12 units dinnertime.      insulin glargine 100 UNIT/ML injection   Commonly known as: LANTUS   Inject 40 Units into the skin at bedtime.      metFORMIN 1000 MG (MOD) 24 hr tablet   Commonly known as: GLUMETZA   Take 1,500 mg by mouth every evening.      nortriptyline 25 MG capsule   Commonly known as: PAMELOR   Take 100 mg by mouth at bedtime.      perphenazine 4 MG tablet   Commonly known as: TRILAFON   Take 4 mg by mouth 3 (three) times daily.      senna-docusate 8.6-50 MG per tablet   Commonly known as: Senokot-S   Take 1  tablet by mouth at bedtime as needed.      topiramate 100 MG tablet   Commonly known as: TOPAMAX   Take 100 mg by mouth 2 (two) times daily.      verapamil 40 MG tablet   Commonly known as: CALAN   Take 60 mg by mouth 2 (two) times daily.             Disposition and Follow-up: Pt medically stable and ready for discharge to home. Will need to follow up with Dr Catalina Lunger in [place] in 1 week. Also follow up by PCP and endocrinologist for further diabetic medication adjustments. Patient advise to follow low carbohydrate diet.  Consults:  neurology   Physical exam  General: Alert, awake, oriented x3, in no acute distress.  HEENT: No bruits, no goiter.  Heart: Regular rate and rhythm, without murmurs, rubs, gallops.  Lungs: Normal effort. Breath sounds clear to auscultation bilaterally. No wheeze  Abdomen:Obese Soft, nontender, nondistended, positive bowel sounds.  Extremities: No clubbing cyanosis or edema with positive pedal pulses.  Neuro: Grossly intact, nonfocal. Speech clear.      Significant Diagnostic Studies:     Labs Reviewed  CBC -  Abnormal; Notable for the following:    WBC 12.1 (*)    All other components within normal limits  BASIC METABOLIC PANEL - Abnormal; Notable for the following:    Glucose, Bld 146 (*)    All other components within normal limits  URINE RAPID DRUG SCREEN (HOSP PERFORMED) - Abnormal; Notable for the following:    Opiates POSITIVE (*)    All other components within normal limits  CBC - Abnormal; Notable for the following:    Hemoglobin 11.9 (*)    All other components within normal limits  BASIC METABOLIC PANEL - Abnormal; Notable for the following:    Glucose, Bld 144 (*)    Calcium 8.3 (*)    All other components within normal limits  GLUCOSE, CAPILLARY - Abnormal; Notable for the following:    Glucose-Capillary 150 (*)    All other components within normal limits  GLUCOSE, CAPILLARY - Abnormal; Notable for the following:     Glucose-Capillary 161 (*)    All other components within normal limits  HEMOGLOBIN A1C - Abnormal; Notable for the following:    Hemoglobin A1C 8.5 (*)    Mean Plasma Glucose 197 (*)    All other components within normal limits  LIPID PANEL - Abnormal; Notable for the following:    Triglycerides 244 (*)    HDL 32 (*)    VLDL 49 (*)    All other components within normal limits  GLUCOSE, CAPILLARY - Abnormal; Notable for the following:    Glucose-Capillary 174 (*)    All other components within normal limits  GLUCOSE, CAPILLARY - Abnormal; Notable for the following:    Glucose-Capillary 131 (*)    All other components within normal limits  GLUCOSE, CAPILLARY - Abnormal; Notable for the following:    Glucose-Capillary 140 (*)    All other components within normal limits  GLUCOSE, CAPILLARY - Abnormal; Notable for the following:    Glucose-Capillary 151 (*)    All other components within normal limits  GLUCOSE, CAPILLARY - Abnormal; Notable for the following:    Glucose-Capillary 107 (*)    All other components within normal limits  GLUCOSE, CAPILLARY - Abnormal; Notable for the following:    Glucose-Capillary 175 (*)    All other components within normal limits  GLUCOSE, CAPILLARY - Abnormal; Notable for the following:    Glucose-Capillary 117 (*)    All other components within normal limits  TROPONIN I  TROPONIN I  TROPONIN I  HEPATIC FUNCTION PANEL        Dg Chest 2 View  07/29/2011  *RADIOLOGY REPORT*  Clinical Data: Chest pain.  CHEST - 2 VIEW  Comparison: Chest radiograph performed 05/08/2010  Findings: The lungs are well-aerated.  There is no evidence of focal opacification, pleural effusion or pneumothorax.  Minimally increased markings at the left lung base are thought to reflect overlying soft tissues.  The heart is normal in size; the mediastinal contour is within normal limits.  No acute osseous abnormalities are seen.  IMPRESSION: No acute cardiopulmonary process  seen.  Original Report Authenticated By: Tonia Ghent, M.D.   Ct Head Wo Contrast  07/27/2011  *RADIOLOGY REPORT*  Clinical Data: Headache with left side paralysis.  CT HEAD WITHOUT CONTRAST  Technique:  Contiguous axial images were obtained from the base of the skull through the vertex without contrast.  Comparison: 05/06/2011  Findings: There is no evidence for acute hemorrhage, hydrocephalus, mass lesion, or abnormal extra-axial fluid collection.  No definite CT evidence of  acute infarct.  Bone windows show the visualized portions of the paranasal sinuses to be clear.  IMPRESSION: Normal exam.  Original Report Authenticated By: ERIC A. MANSELL, M.D.   2-D echo  07/29/11  Study Conclusions  - Left ventricle: The cavity size was normal. Systolic function was normal. The estimated ejection fraction was in the range of 55% to 60%. Wall motion was normal; there were no regional wall motion abnormalities. - Atrial septum: No defect or patent foramen ovale was identified.   Carotid dopplers  07/28/11  Summary: No significant extracranial carotid artery stenosis demonstrated. Veterbrals are patent with antegrade flow       Brief H and P: For complete details please refer to admission H and P, but in brief  This is a 36 year old female with history of migraines with left hemiplegia. With a question of conversion disorder by psychiatry on previous visits. She presented on 07/28/11 with report of a sudden onset migraine headache and left-sided hemiplegia and slurred speech. She stated recognized the symptoms and came to the ER. Per patient's she had the symptoms and several times annually. She additionally stated she had some left-sided chest pains which is new for her. She stated it did not radiate.    Hospital Course:   Active Problems:  Hemiplegic migraine  Conversion disorder  Diabetes type 2, uncontrolled  Chest pain  Hemiplegia affecting left nondominant side 1-Hemiplegic migraine: pt  admitted to tele. Ct head and chest xray as above. Pt refused MRI  recommended based on symptoms. Echo with 55-60% EF and carotid dopplar without stenosis. Pt also seen by neuro who recommended no further narcotics and Trial of IV VPA. Pt neurologist Dr Catalina Lunger in [facility] also consulted by phone and recommended IV magnesium which pt received. Pt gradually improved with resolving hemiplegia and less of headache. At time of discharge pt  Able to initiate ambulation but still difficulty with LLE.  Pt reports being ready to go home.  Reports family will be available for assistance.  Will continue fioricet PRN. Will need OP follow up with neurologist in 1 week.  2-Conversion disorder: hx of; will continue treatment as above. No second gain identified at this point.   3-Diabetes type 2, uncontrolled:continue SSI, lantus and metformin. A1C 8.5 Will need OP follow up with PCP for optimal control  4-Chest pain: non-cardiac in origin; CE'z negative X 3; no telemetry abnormality and no ischemic process on EKG. Most likely 2/2 GERD. Will use protonix  5-Hemiplegia affecting left nondominant side: as above; secondary to migraine. 2decho with EF 55-60%, carotid dopplers without significant stenosis. See #1.   6-HLD: continue statins and low fat diet.   Time spent on Discharge: 40 minutes  Signed: Gwenyth Bender 07/30/2011, 10:31 AM

## 2011-08-03 ENCOUNTER — Ambulatory Visit
Admission: RE | Admit: 2011-08-03 | Discharge: 2011-08-03 | Disposition: A | Discharge status: Home / Self Care | Payer: BC Managed Care – PPO | Source: Ambulatory Visit | Attending: Obstetrics and Gynecology | Admitting: Obstetrics and Gynecology

## 2011-08-03 DIAGNOSIS — N6452 Nipple discharge: Secondary | ICD-10-CM

## 2011-08-04 DIAGNOSIS — S30860A Insect bite (nonvenomous) of lower back and pelvis, initial encounter: Secondary | ICD-10-CM | POA: Diagnosis not present

## 2011-08-11 DIAGNOSIS — D485 Neoplasm of uncertain behavior of skin: Secondary | ICD-10-CM | POA: Diagnosis not present

## 2011-08-11 DIAGNOSIS — L219 Seborrheic dermatitis, unspecified: Secondary | ICD-10-CM | POA: Diagnosis not present

## 2011-08-11 DIAGNOSIS — D239 Other benign neoplasm of skin, unspecified: Secondary | ICD-10-CM | POA: Diagnosis not present

## 2011-08-13 ENCOUNTER — Ambulatory Visit (INDEPENDENT_AMBULATORY_CARE_PROVIDER_SITE_OTHER): Payer: BC Managed Care – PPO | Admitting: General Surgery

## 2011-08-18 DIAGNOSIS — W57XXXA Bitten or stung by nonvenomous insect and other nonvenomous arthropods, initial encounter: Secondary | ICD-10-CM | POA: Diagnosis not present

## 2011-08-18 DIAGNOSIS — G43909 Migraine, unspecified, not intractable, without status migrainosus: Secondary | ICD-10-CM | POA: Diagnosis not present

## 2011-08-18 DIAGNOSIS — IMO0001 Reserved for inherently not codable concepts without codable children: Secondary | ICD-10-CM | POA: Diagnosis not present

## 2011-08-18 DIAGNOSIS — S30860A Insect bite (nonvenomous) of lower back and pelvis, initial encounter: Secondary | ICD-10-CM | POA: Diagnosis not present

## 2011-08-20 ENCOUNTER — Ambulatory Visit (INDEPENDENT_AMBULATORY_CARE_PROVIDER_SITE_OTHER): Payer: BC Managed Care – PPO | Admitting: General Surgery

## 2011-08-20 ENCOUNTER — Encounter (INDEPENDENT_AMBULATORY_CARE_PROVIDER_SITE_OTHER): Payer: Self-pay | Admitting: General Surgery

## 2011-08-20 NOTE — Progress Notes (Signed)
Subjective:  Morbid obesity  Patient ID: [name], female   DOB: March 27, 1975, 36 y.o.   MRN: [id number]  HPI [name] y.o.female referred by Dr Larwance Sachs for consideration for surgical treatment for morbid obesity.  she gives a history of progressive obesity since adolesence despite multiple attempts at medical management.  her weight has been affecting her in a number of ways including difficulty in participating in routine activities with her family.  She is also concerned about her long term health she has been to our initial information seminar, researched surgical options thoroughly and is interested in gastric bypass.She has been through numerous efforts at nonsurgical weight loss with minimal success. Past Medical History  Diagnosis Date  . Depression   . Anxiety   . Diabetes mellitus   . GERD (gastroesophageal reflux disease)   . Migraine   . ADD (attention deficit disorder)   . Hyperlipemia   . Herniated disc   . Sleep apnea     obsructive  . Allergy     Seasonal  . Diabetic neuropathy    Past Surgical History  Procedure Date  . Cholecystectomy   . Cesarean section   . Tonsillectomy   . Laporoscopy 2007   Current Outpatient Prescriptions  Medication Sig Dispense Refill  . amphetamine-dextroamphetamine (ADDERALL) 30 MG tablet Take 30 mg by mouth 2 (two) times daily. At 8am and 12pm for concentration      . atorvastatin (LIPITOR) 20 MG tablet Take 20 mg by mouth daily.      . B-D ULTRAFINE III SHORT PEN 31G X 8 MM MISC       . BEPREVE 1.5 % SOLN       . clobetasol (TEMOVATE) 0.05 % external solution       . cyclobenzaprine (FLEXERIL) 10 MG tablet Take 10 mg by mouth 2 (two) times daily as needed. For headaches      . fenofibrate micronized (LOFIBRA) 200 MG capsule Take 200 mg by mouth at bedtime.       . insulin aspart (NOVOLOG) 100 UNIT/ML injection Inject 8-12 Units into the skin 3 (three) times daily before meals. 8units breakfast 10units lunch 12 units  dinnertime.      . insulin glargine (LANTUS) 100 UNIT/ML injection Inject 40 Units into the skin at bedtime.       Marland Kitchen ketoconazole (NIZORAL) 2 % shampoo       . metFORMIN (GLUMETZA) 1000 MG (MOD) 24 hr tablet Take 1,500 mg by mouth every evening.       Marland Kitchen NASONEX 50 MCG/ACT nasal spray       . nortriptyline (PAMELOR) 25 MG capsule Take 100 mg by mouth at bedtime.      Marland Kitchen perphenazine (TRILAFON) 4 MG tablet Take 4 mg by mouth 3 (three) times daily.      Marland Kitchen PROAIR HFA 108 (90 BASE) MCG/ACT inhaler       . promethazine (PHENERGAN) 6.25 MG/5ML syrup       . topiramate (TOPAMAX) 100 MG tablet Take 100 mg by mouth 2 (two) times daily.      . verapamil (CALAN) 40 MG tablet Take 60 mg by mouth 2 (two) times daily.       Allergies  Allergen Reactions  . Atarax (Hydroxyzine Hcl) Anaphylaxis  . Cephalosporins Anaphylaxis  . Codeine Anaphylaxis  . Compazine Anaphylaxis  . Triptans Anaphylaxis  . Zofran Anaphylaxis  . Decadron (Dexamethasone)   . Demerol   . Metoclopramide Hcl   . Morphine  And Related   . Penicillins   . Sulfa Antibiotics      Review of Systems  Constitutional: Positive for fatigue. Negative for fever and chills.  HENT: Negative for trouble swallowing.   Eyes: Negative.   Respiratory: Negative.   Cardiovascular: Negative.   Gastrointestinal: Positive for nausea. Negative for vomiting, abdominal pain, diarrhea, constipation, blood in stool, abdominal distention and anal bleeding.  Neurological: Positive for speech difficulty, weakness, numbness and headaches.  Psychiatric/Behavioral: The patient is nervous/anxious.        Objective:   Physical Exam BP 126/88  Pulse 78  Temp(Src) 98 F (36.7 C) (Temporal)  Resp 14  Ht 5' 6.75" (1.695 m)  Wt 287 lb 6.4 oz (130.364 kg)  BMI 45.35 kg/m2  General: Alert, morbidly obese Caucasian female, in no distress Skin: Warm and dry without rash or infection. HEENT: No palpable masses or thyromegaly. Sclera nonicteric. Pupils equal  round and reactive. Oropharynx clear. Lymph nodes: No cervical, supraclavicular, or inguinal nodes palpable. Lungs: Breath sounds clear and equal without increased work of breathing Cardiovascular: Regular rate and rhythm without murmur. No JVD or edema. Peripheral pulses intact. Abdomen: Nondistended. Soft and nontender. No masses palpable. No organomegaly. No palpable hernias. Extremities: No edema or joint swelling or deformity. No chronic venous stasis changes. Neurologic: Alert and fully oriented. Gait normal.     Assessment:     Patient with progressive morbid obesity unresponsive to multiple efforts at medical management who presents with a BMI of 45.35 and comorbidities of IDDM and OSA. I believe there would be very significant medical benefit from surgical weight loss. After our discussion of surgical options currently available the patient has decided to proceed with laparoscopic Roux-en-Y gastric bypass due to the reasons above. We have discussed the nature of the problem and the risks of remaining morbidly obese. We discussed laparoscopic Roux-en-Y gastric bypass in detail including the nature of the procedure, expected hospitalization and recovery, and major risks of anesthetic complications, bleeding, blood clots and pulmonary emboli, leakage and infection and long-term risks of stricture, ulceration, bowel obstruction, nutritional deficiencies, and failure to lose weight or weight regain. We discussed these problems could lead to death. The patient was given a complete consent form to review and all questions were answered. We will go ahead with preoperative including psychological and nutrition evaluations, H. pylori testing, ultrasound, bone density, and routine lab and x-rays. I will see the patient back following these studies. Due to her history of nausea and diabetes I'm going to obtain a gastric emptying study as well. She has remarkably severe migraines with episodes of hemi-paresis  but her neurologist feels the surgery would be in her best interest and I do not think this is a contraindication.We discussed that she needs to have her hormonally based IUD removed preoperatively.    Plan:     Proceed with preoperative workup for gastric bypass as above

## 2011-08-21 ENCOUNTER — Other Ambulatory Visit (INDEPENDENT_AMBULATORY_CARE_PROVIDER_SITE_OTHER): Payer: Self-pay

## 2011-08-21 DIAGNOSIS — Z87898 Personal history of other specified conditions: Secondary | ICD-10-CM

## 2011-08-21 DIAGNOSIS — E119 Type 2 diabetes mellitus without complications: Secondary | ICD-10-CM

## 2011-08-31 ENCOUNTER — Ambulatory Visit (HOSPITAL_COMMUNITY): Payer: BC Managed Care – PPO

## 2011-08-31 ENCOUNTER — Ambulatory Visit (HOSPITAL_COMMUNITY)
Admission: RE | Admit: 2011-08-31 | Discharge: 2011-08-31 | Disposition: A | Discharge status: Home / Self Care | Payer: BC Managed Care – PPO | Source: Ambulatory Visit | Attending: General Surgery | Admitting: General Surgery

## 2011-08-31 DIAGNOSIS — Z13820 Encounter for screening for osteoporosis: Secondary | ICD-10-CM | POA: Insufficient documentation

## 2011-09-03 ENCOUNTER — Encounter (HOSPITAL_COMMUNITY)
Admission: RE | Admit: 2011-09-03 | Discharge: 2011-09-03 | Disposition: A | Discharge status: Home / Self Care | Payer: BC Managed Care – PPO | Source: Ambulatory Visit | Attending: General Surgery | Admitting: General Surgery

## 2011-09-03 DIAGNOSIS — Z87898 Personal history of other specified conditions: Secondary | ICD-10-CM

## 2011-09-03 DIAGNOSIS — Z8719 Personal history of other diseases of the digestive system: Secondary | ICD-10-CM | POA: Insufficient documentation

## 2011-09-03 DIAGNOSIS — E119 Type 2 diabetes mellitus without complications: Secondary | ICD-10-CM | POA: Insufficient documentation

## 2011-09-03 MED ORDER — TECHNETIUM TC 99M SULFUR COLLOID
2.0000 | Freq: Once | INTRAVENOUS | Status: AC | PRN
  Administered 2011-09-03: 2 via ORAL

## 2011-09-04 ENCOUNTER — Encounter: Payer: BC Managed Care – PPO | Attending: General Surgery | Admitting: *Deleted

## 2011-09-04 ENCOUNTER — Encounter: Payer: Self-pay | Admitting: *Deleted

## 2011-09-04 DIAGNOSIS — Z01818 Encounter for other preprocedural examination: Secondary | ICD-10-CM | POA: Insufficient documentation

## 2011-09-04 DIAGNOSIS — Z713 Dietary counseling and surveillance: Secondary | ICD-10-CM | POA: Insufficient documentation

## 2011-09-04 NOTE — Patient Instructions (Addendum)
   Follow Pre-Op Nutrition Goals to prepare for Gastric Bypass Surgery.   Call the [facility] at [phone] once you have been given your surgery date to enrolled in the Pre-Op Nutrition Class. You will need to attend this nutrition class 3-4 weeks prior to your surgery. 

## 2011-09-04 NOTE — Progress Notes (Signed)
  Pre-Op Assessment Visit:  Pre-Operative RYGB Surgery  Medical Nutrition Therapy:  Appt start time: 0800   End time:  0900.  Patient was seen on 09/04/2011 for Pre-Operative RYGB Nutrition Assessment. Assessment and letter of approval faxed to [facility] Bariatric Surgery Program coordinator on 09/04/2011.  Approval letter sent to [facility] center and will be available in the chart under the media tab.  Handouts given during visit include:  Pre-Op Goals   Bariatric Protein Shakes handout  Patient to call for Pre-Op and Post-Op Nutrition Education at the [facility] when surgery is scheduled.

## 2011-09-09 ENCOUNTER — Emergency Department (HOSPITAL_COMMUNITY): Payer: BC Managed Care – PPO

## 2011-09-09 ENCOUNTER — Other Ambulatory Visit: Payer: Self-pay

## 2011-09-09 ENCOUNTER — Emergency Department (HOSPITAL_COMMUNITY)
Admission: EM | Admit: 2011-09-09 | Discharge: 2011-09-10 | Disposition: A | Discharge status: Home / Self Care | Payer: BC Managed Care – PPO | Attending: Emergency Medicine | Admitting: Emergency Medicine

## 2011-09-09 ENCOUNTER — Encounter (HOSPITAL_COMMUNITY): Payer: Self-pay | Admitting: *Deleted

## 2011-09-09 DIAGNOSIS — R0602 Shortness of breath: Secondary | ICD-10-CM | POA: Insufficient documentation

## 2011-09-09 DIAGNOSIS — F329 Major depressive disorder, single episode, unspecified: Secondary | ICD-10-CM | POA: Insufficient documentation

## 2011-09-09 DIAGNOSIS — F3289 Other specified depressive episodes: Secondary | ICD-10-CM | POA: Insufficient documentation

## 2011-09-09 DIAGNOSIS — F411 Generalized anxiety disorder: Secondary | ICD-10-CM | POA: Insufficient documentation

## 2011-09-09 DIAGNOSIS — E785 Hyperlipidemia, unspecified: Secondary | ICD-10-CM | POA: Insufficient documentation

## 2011-09-09 DIAGNOSIS — Z794 Long term (current) use of insulin: Secondary | ICD-10-CM | POA: Insufficient documentation

## 2011-09-09 DIAGNOSIS — R5381 Other malaise: Secondary | ICD-10-CM | POA: Insufficient documentation

## 2011-09-09 DIAGNOSIS — Z79899 Other long term (current) drug therapy: Secondary | ICD-10-CM | POA: Insufficient documentation

## 2011-09-09 DIAGNOSIS — E119 Type 2 diabetes mellitus without complications: Secondary | ICD-10-CM | POA: Insufficient documentation

## 2011-09-09 DIAGNOSIS — R5383 Other fatigue: Secondary | ICD-10-CM

## 2011-09-09 LAB — COMPREHENSIVE METABOLIC PANEL
Albumin: 4 g/dL (ref 3.5–5.2)
Alkaline Phosphatase: 64 U/L (ref 39–117)
BUN: 8 mg/dL (ref 6–23)
Chloride: 96 mEq/L (ref 96–112)
Creatinine, Ser: 0.47 mg/dL — ABNORMAL LOW (ref 0.50–1.10)
GFR calc Af Amer: >90 mL/min (ref >90)
Glucose, Bld: 285 mg/dL — ABNORMAL HIGH (ref 70–99)
Potassium: 4 mEq/L (ref 3.5–5.1)
Total Bilirubin: 0.2 mg/dL — ABNORMAL LOW (ref 0.3–1.2)
Total Protein: 7.6 g/dL (ref 6.0–8.3)

## 2011-09-09 LAB — GLUCOSE, CAPILLARY: Glucose-Capillary: 284 mg/dL — ABNORMAL HIGH (ref 70–99)

## 2011-09-09 LAB — CBC
HCT: 41.8 % (ref 36.0–46.0)
Hemoglobin: 13.9 g/dL (ref 12.0–15.0)
MCHC: 33.3 g/dL (ref 30.0–36.0)
MCV: 80.9 fL (ref 78.0–100.0)
RDW: 13.2 % (ref 11.5–15.5)

## 2011-09-09 LAB — URINALYSIS, ROUTINE W REFLEX MICROSCOPIC
Bilirubin Urine: NEGATIVE
Ketones, ur: NEGATIVE mg/dL
Leukocytes, UA: NEGATIVE
Nitrite: NEGATIVE
Protein, ur: NEGATIVE mg/dL
Urobilinogen, UA: 0.2 mg/dL (ref 0.0–1.0)

## 2011-09-09 LAB — D-DIMER, QUANTITATIVE: D-Dimer, Quant: 0.85 ug/mL-FEU — ABNORMAL HIGH (ref 0.00–0.48)

## 2011-09-09 LAB — LIPASE, BLOOD: Lipase: 31 U/L (ref 11–59)

## 2011-09-09 MED ORDER — SODIUM CHLORIDE 0.9 % IV BOLUS (SEPSIS)
1000.0000 mL | Freq: Once | INTRAVENOUS | Status: AC
  Administered 2011-09-09: 1000 mL via INTRAVENOUS

## 2011-09-09 MED ORDER — PROMETHAZINE HCL 25 MG/ML IJ SOLN
12.5000 mg | Freq: Once | INTRAMUSCULAR | Status: AC
  Administered 2011-09-09: 12.5 mg via INTRAVENOUS
  Filled 2011-09-09: qty 1

## 2011-09-09 MED ORDER — SODIUM CHLORIDE 0.9 % IV BOLUS (SEPSIS): 1000.0000 mL | Freq: Once | INTRAVENOUS | Status: DC

## 2011-09-09 MED ORDER — HYDROMORPHONE HCL PF 1 MG/ML IJ SOLN
1.0000 mg | Freq: Once | INTRAMUSCULAR | Status: AC
  Administered 2011-09-09: 1 mg via INTRAVENOUS
  Filled 2011-09-09: qty 1

## 2011-09-09 MED ORDER — PROMETHAZINE HCL 25 MG/ML IJ SOLN
25.0000 mg | Freq: Once | INTRAMUSCULAR | Status: AC
  Administered 2011-09-09: 25 mg via INTRAVENOUS
  Filled 2011-09-09: qty 1

## 2011-09-09 MED ORDER — IOHEXOL 350 MG/ML SOLN
100.0000 mL | Freq: Once | INTRAVENOUS | Status: AC | PRN
  Administered 2011-09-09: 100 mL via INTRAVENOUS

## 2011-09-09 NOTE — ED Provider Notes (Signed)
Medical screening examination/treatment/procedure(s) were performed by non-physician practitioner and as supervising physician I was immediately available for consultation/collaboration.   Gwyneth Sprout, MD 09/09/11 2314

## 2011-09-09 NOTE — ED Notes (Signed)
Per pt: Reports sudden onset of near syncope around 12:00pm today. States she felt chest tightness/discomfort, lightheaded and disoriented. Vomited in waiting room x1, diarrhea multiple times. Has hx of diabetes, states she has not checked BS today. Denies hx of seizures

## 2011-09-09 NOTE — ED Notes (Signed)
CBG 284 

## 2011-09-09 NOTE — ED Provider Notes (Signed)
8:15 PM Patient care resumed from [place], [facility].  Patient presented to emergency department with chief complaint of shortness of breath and fatigue and has thus far had a negative workup.  Previous provider with a low suspicion for cardiac or pulmonary etiology but do to tachycardia and tachypnea D-dimer & 2nd trop pending prior to patient's discharge.  She is currently  Receiving 1L NS bolus, can receive 1 additional. Phenergan rx for home if d/c. on reevaluation patient is sitting comfortably in no acute distress   10:01 PM Elevated D-dimer, CT angio pending to r/o PE.   11:51 PM CTAngio IMPRESSION:  1. No evidence of pulmonary embolism. 2. Mild bilateral dependent atelectasis. 3. Hepatic steatosis.  Ordered pain meds. Pt stable. Pending 2nd trop.   12:39 AM Trop WNL. Patient is stable and in NAD prior to discharge. Diagnosis was discussed with patient who verbalizes understanding and is agreeable to discharge. Dc home w PCP follow up per previous providers plan       Jaci Carrel, PA-C 09/10/11 0040

## 2011-09-09 NOTE — ED Provider Notes (Addendum)
History     CSN: [id number]  Arrival date & time 09/09/11  1511   First MD Initiated Contact with Patient 09/09/11 1742      Chief Complaint  Patient presents with  . Fatigue  . Shortness of Breath    (Consider location/radiation/quality/duration/timing/severity/associated sxs/prior treatment) The history is provided by the patient.   the patient is a 36 year old female past medical history of anxiety, depression, diabetes, hemiplegic migraine, conversion disorder who presents the emergency department with several complaints today to include questionable seizure activity, nausea vomiting and diarrhea for the last one to 2 days, and shortness of breath with dull chest aching since the seizure activity several hours ago.  She describes the seizure activity as an episode lasting an unknown duration where she felt her eyes moving back and forth and felt all of her extremities jerking. She was awake and conscious of the entire event. She did not have enough control to stop the event what was happening. There was no incontinence. There was no tongue injury. When the episode finished, she was unable to move for another several minutes at least and then called her husband who brought her to the emergency department. She has no history of seizure disorder. He has had multiple neurologic and psychiatric evaluations in the past for various complaints. She describes this episode is different from any prior episodes and not similar to her hemiplegic migraine. No treatment has been attempted. Since this episode, she has had mild shortness of breath and a double aching in her chest. No cough or congestion. Pain in the chest as no aggravating or alleviating factors, it is not pleuritic and it is nonexertional. Denies cardiac history.  She has also had diarrhea since yesterday associated with mild mid abdominal pain that is constant. The diarrhea is nonbloody and non-mucous. There have also been several episodes  of emesis today which were nonbloody and nonbilious. There is been no associated fever or chills. No known sick contacts.   Past Medical History  Diagnosis Date  . Depression   . Anxiety   . Diabetes mellitus   . GERD (gastroesophageal reflux disease)   . Migraine   . ADD (attention deficit disorder)   . Hyperlipemia   . Herniated disc   . Sleep apnea     obsructive  . Allergy     Seasonal  . Diabetic neuropathy     Past Surgical History  Procedure Date  . Cholecystectomy   . Cesarean section   . Tonsillectomy   . Laporoscopy 2007    Family History  Problem Relation Age of Onset  . Diabetes type II    . Diabetes Mother   . Hypertension Mother   . Depression Brother   . Cancer Maternal Aunt     ovarian   . Depression Maternal Aunt   . Diabetes Maternal Aunt   . Cancer Maternal Grandmother     Lung  . COPD Maternal Grandmother   . Cancer Maternal Grandfather     Prostate  . Diabetes Maternal Grandfather   . Hearing loss Maternal Grandfather   . Hypertension Maternal Grandfather   . Stroke Maternal Grandfather   . Cancer Paternal Grandmother     Colon    History  Substance Use Topics  . Smoking status: Never Smoker   . Smokeless tobacco: Never Used  . Alcohol Use: No     Review of Systems 10 systems reviewed and are negative for acute change except as noted in the HPI.  Allergies  Atarax; Cephalosporins; Codeine; Compazine; Decadron; Demerol; Metoclopramide hcl; Morphine and related; Penicillins; Sulfa antibiotics; Triptans; and Zofran  Home Medications   Current Outpatient Rx  Name Route Sig Dispense Refill  . AMPHETAMINE-DEXTROAMPHETAMINE 30 MG PO TABS Oral Take 30 mg by mouth 2 (two) times daily. At 8am and 12pm for concentration    . ATORVASTATIN CALCIUM 20 MG PO TABS Oral Take 20 mg by mouth daily.    Marland Kitchen CLOBETASOL PROPIONATE 0.05 % EX SOLN  1 application 2 (two) times daily.     . CYCLOBENZAPRINE HCL 10 MG PO TABS Oral Take 10 mg by mouth 2  (two) times daily as needed. For headaches    . FENOFIBRATE MICRONIZED 200 MG PO CAPS Oral Take 200 mg by mouth at bedtime.     . INSULIN ASPART 100 UNIT/ML [place] SOLN Subcutaneous Inject 8-12 Units into the skin 3 (three) times daily before meals. 8units breakfast 10units lunch 12 units dinnertime.    . INSULIN GLARGINE 100 UNIT/ML [place] SOLN Subcutaneous Inject 40 Units into the skin at bedtime.     Marland Kitchen KETOCONAZOLE 2 % EX SHAM      . METFORMIN HCL ER (MOD) 1000 MG PO TB24 Oral Take 1,500 mg by mouth every evening.     Marland Kitchen NASONEX 50 MCG/ACT NA SUSP      . NORTRIPTYLINE HCL 25 MG PO CAPS Oral Take 100 mg by mouth at bedtime.    Marland Kitchen PERPHENAZINE 4 MG PO TABS Oral Take 4 mg by mouth 3 (three) times daily.    Marland Kitchen PROAIR HFA 108 (90 BASE) MCG/ACT IN AERS  2 puffs every 6 (six) hours as needed. S.O.B    . PROMETHAZINE HCL 6.25 MG/5ML PO SYRP      . TOPIRAMATE 100 MG PO TABS Oral Take 100 mg by mouth 2 (two) times daily.    Marland Kitchen VERAPAMIL HCL 40 MG PO TABS Oral Take 60 mg by mouth 2 (two) times daily.      BP 124/84  Pulse 121  Temp 98 F (36.7 C) (Oral)  SpO2 97%  LMP 08/31/2011  Physical Exam  Nursing note and vitals reviewed. Constitutional: She is oriented to person, place, and time. She appears well-developed and well-nourished.       Appears uncomfortable  HENT:  Head: Normocephalic and atraumatic.  Right Ear: External ear normal.  Left Ear: External ear normal.       MMM  Eyes: Conjunctivae and EOM are normal. Pupils are equal, round, and reactive to light.  Neck: Neck supple.  Cardiovascular:       tachycardia with regular rhythm  Pulmonary/Chest: Effort normal. No respiratory distress. She has no wheezes.       Speaks in complete sentences without trouble  Abdominal: Soft. Bowel sounds are normal. She exhibits no distension. There is tenderness (mild, diffuse).  Musculoskeletal: She exhibits no edema.  Neurological: She is alert and oriented to person, place, and time.       Speech clear.  MAEW. 5/5 strength in all extremities as tested in major muscle groups.   Skin: Skin is warm and dry.  Psychiatric:       Flat affect    ED Course  Procedures (including critical care time)  Labs Reviewed  GLUCOSE, CAPILLARY - Abnormal; Notable for the following:    Glucose-Capillary 284 (*)     All other components within normal limits  CBC - Abnormal; Notable for the following:    WBC 12.0 (*)  RBC 5.17 (*)     All other components within normal limits  COMPREHENSIVE METABOLIC PANEL - Abnormal; Notable for the following:    Sodium 133 (*)     Glucose, Bld 285 (*)     Creatinine, Ser 0.47 (*)     Total Bilirubin 0.2 (*)     All other components within normal limits  URINALYSIS, ROUTINE W REFLEX MICROSCOPIC - Abnormal; Notable for the following:    APPearance CLOUDY (*)     Specific Gravity, Urine 1.036 (*)     Glucose, UA >1000 (*)     All other components within normal limits  URINE MICROSCOPIC-ADD ON - Abnormal; Notable for the following:    Squamous Epithelial / LPF FEW (*)     Bacteria, UA FEW (*)     Crystals CA OXALATE CRYSTALS (*)     All other components within normal limits  LIPASE, BLOOD  POCT PREGNANCY, URINE  TROPONIN I   Dg Chest 2 View  09/09/2011  *RADIOLOGY REPORT*  Clinical Data: Chest pain.  Vertigo.  Nausea.  Diarrhea.  Recent seizure.  CHEST - 2 VIEW  Comparison: 07/29/2011  Findings: Heart size is normal.  Both lungs are clear.  No evidence of pleural effusion.  No mass or lymphadenopathy identified.  No significant change seen compared to prior exam.  IMPRESSION: Stable exam.  No active disease.  Original Report Authenticated By: Danae Orleans, M.D.   Dg Abd 2 Views  09/09/2011  *RADIOLOGY REPORT*  Clinical Data: Abdominal pain.  Vomiting.  Diarrhea.  ABDOMEN - 2 VIEW  Comparison: 08/20/2010  Findings: The bowel gas pattern is normal.  No evidence of air- fluid levels or free intraperitoneal air.  No radiopaque calculi identified.  Right upper  quadrant surgical clips seen from prior cholecystectomy.  IMPRESSION: Negative.  Original Report Authenticated By: Danae Orleans, M.D.    Date: 09/09/2011  Rate: 117  Rhythm: sinus tachycardia  QRS Axis: normal  Intervals: normal  ST/T Wave abnormalities: borderline t abnormalities  Conduction Disutrbances:none  Narrative Interpretation: compared to prior EKG dated 06/22/2010, lead III with slightly depressed t-waves- isolated and non-specific  Old EKG Reviewed: changes noted    No diagnosis found.    MDM  Patient with several presenting complaints. Episode earlier today does not sound like a seizure and she was aware of the entire course. No incontinence and no prior diagnosis of seizures. She has had many CT scans of the head and several brain MRIs without a suggestion of a cause for this type of event. Her labs are reviewed and show no known cause, a d-dimer is pending for evaluation of a pulmonary embolus as cause though she has no exogenous estrogen use, is a nonsmoker, and has no recent prolonged trip- she cannot be ruled out with per criteria as she is tachypneic and tachycardic throughout her visit per VS documentation. Doubt ACS as non-specific EKG findings, neg troponin x 1. Second troponin pending.  She has had not had emesis in ED since my eval. Diarrhea slowed. No evidence of bowel obstruction on abd plain films. On re-exam, abd remains soft with good bowel sounds. No disturbance in lipase, renal function, or hepatic function tests. Slight leukocytosis likely due to 24h diarrheal illness as well as hemoconcentration from mild dehydration as evidenced on UA. Will rehydrate with IV NS and send home with phenergan rx.    PA Paz received report on this patient and will d/c home if neg d-dimer and second  troponin/order further workup if abnormal finding on these.        Shaaron Adler, PA-C 09/09/11 2059  Shaaron Adler, PA-C 10/07/11 1016

## 2011-09-09 NOTE — ED Notes (Signed)
Wheeled pt into restroom for urine sample. Patient unable to provide at this time.

## 2011-09-09 NOTE — ED Notes (Signed)
Patient transported to CT 

## 2011-09-10 LAB — POCT I-STAT TROPONIN I: Troponin i, poc: 0 ng/mL (ref 0.00–0.08)

## 2011-09-10 MED ORDER — PROMETHAZINE HCL 6.25 MG/5ML PO SYRP: 6.2500 mg | ORAL_SOLUTION | Freq: Four times a day (QID) | ORAL | Status: DC | PRN

## 2011-09-10 NOTE — ED Provider Notes (Signed)
Medical screening examination/treatment/procedure(s) were performed by non-physician practitioner and as supervising physician I was immediately available for consultation/collaboration.   Gwyneth Sprout, MD 09/10/11 [phone]

## 2011-09-10 NOTE — Discharge Instructions (Signed)

## 2011-09-11 ENCOUNTER — Encounter (HOSPITAL_COMMUNITY)
Admission: RE | Disposition: A | Discharge status: Home / Self Care | Payer: Self-pay | Source: Ambulatory Visit | Attending: General Surgery

## 2011-09-11 ENCOUNTER — Ambulatory Visit (HOSPITAL_COMMUNITY)
Admission: RE | Admit: 2011-09-11 | Discharge: 2011-09-11 | Disposition: A | Discharge status: Home / Self Care | Payer: BC Managed Care – PPO | Source: Ambulatory Visit | Attending: General Surgery | Admitting: General Surgery

## 2011-09-11 HISTORY — PX: BREATH TEK H PYLORI: SHX5422

## 2011-09-11 SURGERY — BREATH TEST, FOR HELICOBACTER PYLORI

## 2011-09-14 ENCOUNTER — Encounter (HOSPITAL_COMMUNITY): Payer: Self-pay | Admitting: General Surgery

## 2011-09-18 ENCOUNTER — Telehealth (INDEPENDENT_AMBULATORY_CARE_PROVIDER_SITE_OTHER): Payer: Self-pay

## 2011-09-18 NOTE — Telephone Encounter (Signed)
Called patient and left voice message on home and mobile phone numbers recorded in patient's chart.  Not available left message to call for Gastric Emptying Study results- normal results.

## 2011-09-23 DIAGNOSIS — E785 Hyperlipidemia, unspecified: Secondary | ICD-10-CM | POA: Diagnosis not present

## 2011-09-23 DIAGNOSIS — IMO0001 Reserved for inherently not codable concepts without codable children: Secondary | ICD-10-CM | POA: Diagnosis not present

## 2011-09-28 ENCOUNTER — Ambulatory Visit: Payer: BC Managed Care – PPO | Admitting: *Deleted

## 2011-10-01 ENCOUNTER — Encounter: Payer: BC Managed Care – PPO | Attending: General Surgery | Admitting: *Deleted

## 2011-10-01 DIAGNOSIS — Z01818 Encounter for other preprocedural examination: Secondary | ICD-10-CM | POA: Insufficient documentation

## 2011-10-01 DIAGNOSIS — Z713 Dietary counseling and surveillance: Secondary | ICD-10-CM | POA: Insufficient documentation

## 2011-10-02 ENCOUNTER — Other Ambulatory Visit (INDEPENDENT_AMBULATORY_CARE_PROVIDER_SITE_OTHER): Payer: Self-pay | Admitting: General Surgery

## 2011-10-03 NOTE — Progress Notes (Addendum)
  Bariatric Class:  Appt start time: 1830 end time:  1930.  Pre-Operative Nutrition Class  Patient was seen on 10/01/11 for Pre-Operative Bariatric Surgery Education at [facility].   Surgery date: TBD Surgery type: RYGB Start weight @ [facility]: 290.5 lbs  Weight today: 285.9 lbs Weight change: n/a Total weight lost: n/a BMI: 44.8 kg/m^2  Samples given per MNT protocol: Bariatric Advantage Multivitamin Lot #  [id number] Exp: 09/13  Bariatric Advantage Calcium Citrate Lot # [id number] Exp: 09/13  Celebrate Vitamins Multivitamin Lot # [id number] Exp: 11/14  Celebrate Vitamins Calcium Citrate Lot # [id number] Exp: 10/14  Unjury Protein Powder (Chicken Soup Flavor) Lot # [id number] Exp: 09/14  The following the learning objective met by the patient during this course:  Identifies Pre-Op Dietary Goals and will begin 2 weeks pre-operatively  Identifies appropriate sources of fluids and proteins   States protein recommendations and appropriate sources pre and post-operatively  Identifies Post-Operative Dietary Goals and will follow for 2 weeks post-operatively  Identifies appropriate multivitamin and calcium sources  Describes the need for physical activity post-operatively and will follow MD recommendations  States when to call healthcare provider regarding medication questions or post-operative complications  Handouts given during class include:  Pre-Op Bariatric Surgery Diet Handout  Protein Shake Handout  Post-Op Bariatric Surgery Nutrition Handout  BELT Program Information Flyer  Support Group Information Flyer  Follow-Up Plan: Patient will follow-up at [facility] 2 weeks post operatively for diet advancement per MD.

## 2011-10-03 NOTE — Patient Instructions (Signed)
Follow:   Pre-Op Diet per MD 2 weeks prior to surgery  Phase 2- Liquids (clear/full) 2 weeks after surgery  Vitamin/Mineral/Calcium guidelines for purchasing bariatric supplements  Exercise guidelines pre and post-op per MD  Follow-up at [facility] in 2 weeks post-op for diet advancement. Contact Reily Treloar as needed with questions/concerns. 

## 2011-10-06 ENCOUNTER — Emergency Department (HOSPITAL_COMMUNITY)
Admission: EM | Admit: 2011-10-06 | Discharge: 2011-10-07 | Disposition: A | Discharge status: Home / Self Care | Payer: BC Managed Care – PPO | Attending: Emergency Medicine | Admitting: Emergency Medicine

## 2011-10-06 ENCOUNTER — Encounter (HOSPITAL_COMMUNITY): Payer: Self-pay | Admitting: Emergency Medicine

## 2011-10-06 DIAGNOSIS — G43709 Chronic migraine without aura, not intractable, without status migrainosus: Secondary | ICD-10-CM | POA: Diagnosis not present

## 2011-10-06 DIAGNOSIS — E119 Type 2 diabetes mellitus without complications: Secondary | ICD-10-CM | POA: Insufficient documentation

## 2011-10-06 DIAGNOSIS — K219 Gastro-esophageal reflux disease without esophagitis: Secondary | ICD-10-CM | POA: Insufficient documentation

## 2011-10-06 DIAGNOSIS — I6789 Other cerebrovascular disease: Secondary | ICD-10-CM | POA: Diagnosis not present

## 2011-10-06 DIAGNOSIS — Z9089 Acquired absence of other organs: Secondary | ICD-10-CM | POA: Insufficient documentation

## 2011-10-06 DIAGNOSIS — F341 Dysthymic disorder: Secondary | ICD-10-CM | POA: Insufficient documentation

## 2011-10-06 DIAGNOSIS — R51 Headache: Secondary | ICD-10-CM | POA: Diagnosis not present

## 2011-10-06 DIAGNOSIS — G43909 Migraine, unspecified, not intractable, without status migrainosus: Secondary | ICD-10-CM | POA: Insufficient documentation

## 2011-10-06 DIAGNOSIS — F988 Other specified behavioral and emotional disorders with onset usually occurring in childhood and adolescence: Secondary | ICD-10-CM | POA: Insufficient documentation

## 2011-10-06 DIAGNOSIS — Z79899 Other long term (current) drug therapy: Secondary | ICD-10-CM | POA: Insufficient documentation

## 2011-10-06 DIAGNOSIS — I499 Cardiac arrhythmia, unspecified: Secondary | ICD-10-CM | POA: Diagnosis not present

## 2011-10-06 NOTE — ED Notes (Signed)
Patient complains of migraine headache that started 1 week ago. Patient reports that when she has migraines it leaves her weak on the left side as if she had a stroke. Patient reports slurred speech, left sided facial drooping and unable to walk that started today. Patient reports normally these symptoms resolve but has not since this episode has started.

## 2011-10-06 NOTE — ED Notes (Signed)
[id number]<AV> Expected date:10/06/11<BR> Expected time:11:07 PM<BR> Means of arrival:Ambulance<BR> Comments:<BR> Headache

## 2011-10-07 MED ORDER — HYDROMORPHONE HCL PF 1 MG/ML IJ SOLN
1.0000 mg | Freq: Once | INTRAMUSCULAR | Status: AC
  Administered 2011-10-07: 1 mg via INTRAVENOUS
  Filled 2011-10-07: qty 1

## 2011-10-07 MED ORDER — PROMETHAZINE HCL 25 MG/ML IJ SOLN
25.0000 mg | Freq: Once | INTRAMUSCULAR | Status: AC
  Administered 2011-10-07: 25 mg via INTRAVENOUS
  Filled 2011-10-07: qty 1

## 2011-10-07 NOTE — ED Provider Notes (Signed)
Medical screening examination/treatment/procedure(s) were performed by non-physician practitioner and as supervising physician I was immediately available for consultation/collaboration.   Waddell Iten, MD 10/07/11 2252 

## 2011-10-07 NOTE — ED Provider Notes (Signed)
History     CSN: [id number]  Arrival date & time 10/06/11  2322   First MD Initiated Contact with Patient 10/07/11 0248      Chief Complaint  Patient presents with  . Migraine    (Consider location/radiation/quality/duration/timing/severity/associated sxs/prior treatment) HPI Comments: Patient comes in today with a chief complaint of migraine headache.  Headache has been present for the past week and is gradually worsening.  She reports that today her headache became associated with left sided weakness, slurred speech, and facial droop.  She took Fioricet for the Migraine, but did not feel that it helped.  She has had similar headaches and left sided weakness in the past.  She has been evaluated by Neurology for this in the past.  Her symptoms have been thought to be caused by conversion disorder.  She last had a MRI on 06/21/11, which was negative.  She also had a CT head that was negative on 07/27/11.  During her last admission in May she had Carotid Dopplers done that did not show significant stenosis.  2D Echo done at that time showed EF 55-60%.  Her Neurologist is Dr Catalina Lunger.  PMH also significant for Anxiety and Depression.    The history is provided by the patient.    Past Medical History  Diagnosis Date  . Depression   . Anxiety   . Diabetes mellitus   . GERD (gastroesophageal reflux disease)   . Migraine   . ADD (attention deficit disorder)   . Hyperlipemia   . Herniated disc   . Sleep apnea     obsructive  . Allergy     Seasonal  . Diabetic neuropathy     Past Surgical History  Procedure Date  . Cholecystectomy   . Cesarean section   . Tonsillectomy   . Laporoscopy 2007  . Breath tek h pylori 09/11/2011    Procedure: BREATH TEK H PYLORI;  Surgeon: Mariella Saa, MD;  Location: [facility];  Service: General;  Laterality: N/A;    Family History  Problem Relation Age of Onset  . Diabetes type II    . Diabetes Mother   . Hypertension Mother   . Depression  Brother   . Cancer Maternal Aunt     ovarian   . Depression Maternal Aunt   . Diabetes Maternal Aunt   . Cancer Maternal Grandmother     Lung  . COPD Maternal Grandmother   . Cancer Maternal Grandfather     Prostate  . Diabetes Maternal Grandfather   . Hearing loss Maternal Grandfather   . Hypertension Maternal Grandfather   . Stroke Maternal Grandfather   . Cancer Paternal Grandmother     Colon    History  Substance Use Topics  . Smoking status: Never Smoker   . Smokeless tobacco: Never Used  . Alcohol Use: No    OB History    Grav Para Term Preterm Abortions TAB SAB Ect Mult Living                  Review of Systems  Constitutional: Negative for fever and chills.  HENT: Negative for neck pain and neck stiffness.   Eyes: Negative for visual disturbance.  Gastrointestinal: Negative for nausea and vomiting.  Musculoskeletal: Positive for gait problem.  Neurological: Positive for facial asymmetry, speech difficulty, weakness and headaches. Negative for dizziness, syncope and light-headedness.  Psychiatric/Behavioral: Negative for confusion.    Allergies  Atarax; Cephalosporins; Codeine; Compazine; Decadron; Demerol; Metoclopramide hcl; Morphine and related;  Penicillins; Sulfa antibiotics; Triptans; and Zofran  Home Medications   Current Outpatient Rx  Name Route Sig Dispense Refill  . ALPRAZOLAM 1 MG PO TABS Oral Take 1 mg by mouth 4 (four) times daily.    . AMPHETAMINE-DEXTROAMPHETAMINE 30 MG PO TABS Oral Take 30 mg by mouth 2 (two) times daily. At 8am and 12pm for concentration    . ATORVASTATIN CALCIUM 20 MG PO TABS Oral Take 20 mg by mouth daily.    Marland Kitchen CLOBETASOL PROPIONATE 0.05 % EX SOLN  1 application 2 (two) times daily.     . CYCLOBENZAPRINE HCL 10 MG PO TABS Oral Take 10 mg by mouth 2 (two) times daily as needed. For headaches    . FENOFIBRATE MICRONIZED 200 MG PO CAPS Oral Take 200 mg by mouth at bedtime.     . INSULIN ASPART 100 UNIT/ML [place] SOLN  Subcutaneous Inject 8-12 Units into the skin 3 (three) times daily before meals. 8units breakfast 10units lunch 12 units dinnertime.    . INSULIN GLARGINE 100 UNIT/ML [place] SOLN Subcutaneous Inject 40 Units into the skin at bedtime.     Marland Kitchen KETOCONAZOLE 2 % EX SHAM Topical Apply 1 application topically daily.     Marland Kitchen METFORMIN HCL 500 MG/5ML PO SOLN Oral Take 5 mLs by mouth 2 (two) times daily.    Marland Kitchen NASONEX 50 MCG/ACT NA SUSP Nasal Place 1 spray into the nose daily.     Marland Kitchen NORTRIPTYLINE HCL 25 MG PO CAPS Oral Take 100-150 mg by mouth at bedtime.     Marland Kitchen PERPHENAZINE 4 MG PO TABS Oral Take 4 mg by mouth 3 (three) times daily.    Marland Kitchen PROAIR HFA 108 (90 BASE) MCG/ACT IN AERS  2 puffs every 6 (six) hours as needed. S.O.B    . PROMETHAZINE HCL 6.25 MG/5ML PO SYRP Oral Take 25 mg by mouth 4 (four) times daily as needed.     . TOPIRAMATE 100 MG PO TABS Oral Take 100 mg by mouth 2 (two) times daily.    Marland Kitchen VERAPAMIL HCL 40 MG PO TABS Oral Take 60 mg by mouth 2 (two) times daily.      BP 114/50  Pulse 86  Temp 98.2 F (36.8 C) (Oral)  Resp 23  SpO2 100%  Physical Exam  Nursing note and vitals reviewed. Constitutional: She appears well-developed and well-nourished. No distress.  HENT:  Head: Normocephalic and atraumatic.  Mouth/Throat: Uvula is midline, oropharynx is clear and moist and mucous membranes are normal.  Eyes: EOM are normal. Pupils are equal, round, and reactive to light.  Neck: Normal range of motion. Neck supple.  Cardiovascular: Normal rate, regular rhythm, normal heart sounds and intact distal pulses.   Pulmonary/Chest: Effort normal and breath sounds normal. No respiratory distress.  Musculoskeletal: Normal range of motion.  Neurological: She is alert.       Patient will not move left upper and lower extremity Will not let her left hand hit face when held over her head and released. Decreased facial sensation on the left Patient able to raise eyebrows on the right and left Midline  tongue extension Smile symmetric Slurred speech Patient unable to ambulate.    Skin: Skin is warm and dry. She is not diaphoretic.  Psychiatric: She has a normal mood and affect.    ED Course  Procedures (including critical care time)  Labs Reviewed - No data to display No results found.   No diagnosis found.  5:45 AM Patient moved her left  hand while distracted.    6:05 AM Reassessed patient.  She is now moving her left upper and lower extremity.    MDM  Patient with history of conversion disorder and anxiety presenting today with a chief complaint of migraine headache associated with left side hemiplegia.  She has had similar migraines in the past.  During her last admission in May she was evaluated by Neurology and symptoms were thought to be secondary to conversion disorder.  Patient given pain medication and her headache and left sided weakness resolved.  Therefore, patient discharged home.  Patient instructed to follow up with her Neurologist.        Magnus Sinning, PA-C 10/09/11 0130

## 2011-10-07 NOTE — ED Notes (Signed)
Patient ambulatory in room with steady gait. Patient given a meal and fed herself. No weakness to left side noted.

## 2011-10-09 NOTE — ED Provider Notes (Signed)
Medical screening examination/treatment/procedure(s) were performed by non-physician practitioner and as supervising physician I was immediately available for consultation/collaboration.   Hanley Seamen, MD 10/09/11 2241

## 2011-10-25 ENCOUNTER — Encounter: Payer: Self-pay | Admitting: *Deleted

## 2011-10-25 NOTE — Progress Notes (Signed)
Spoke with patient for 55 min this evening. Pt reports she changed to the liquid form of Nortriptyline in preparation for surgery.  Due to rescheduling of surgery to a later date, patient has d/c'd pre-op diet and following a modified version with 30g of carbs per meal and 15-20 g per snack. Will resume on 11/16/11 for surgery 11/30/11.  Reports since starting diet, her FBGs have been ~130 mg/dl (down from 161-096) and 2hPP ~145-148 mg/dl (down from 045-409).  Has decreased insulin to prevent hypoglycemia.  Reviewed patient's allergies and medications. She plans to come in for repeat Tanita scan this week.

## 2011-11-11 DIAGNOSIS — IMO0001 Reserved for inherently not codable concepts without codable children: Secondary | ICD-10-CM | POA: Diagnosis not present

## 2011-11-11 DIAGNOSIS — E785 Hyperlipidemia, unspecified: Secondary | ICD-10-CM | POA: Diagnosis not present

## 2011-11-16 ENCOUNTER — Encounter (HOSPITAL_COMMUNITY): Payer: Self-pay | Admitting: Pharmacy Technician

## 2011-11-17 DIAGNOSIS — G43909 Migraine, unspecified, not intractable, without status migrainosus: Secondary | ICD-10-CM | POA: Diagnosis not present

## 2011-11-17 DIAGNOSIS — E785 Hyperlipidemia, unspecified: Secondary | ICD-10-CM | POA: Diagnosis not present

## 2011-11-17 DIAGNOSIS — G4733 Obstructive sleep apnea (adult) (pediatric): Secondary | ICD-10-CM | POA: Diagnosis not present

## 2011-11-17 DIAGNOSIS — IMO0001 Reserved for inherently not codable concepts without codable children: Secondary | ICD-10-CM | POA: Diagnosis not present

## 2011-11-24 ENCOUNTER — Encounter (HOSPITAL_COMMUNITY): Payer: Self-pay

## 2011-11-24 ENCOUNTER — Encounter (HOSPITAL_COMMUNITY)
Admission: RE | Admit: 2011-11-24 | Discharge: 2011-11-24 | Disposition: A | Discharge status: Home / Self Care | Payer: BC Managed Care – PPO | Source: Ambulatory Visit | Attending: General Surgery | Admitting: General Surgery

## 2011-11-24 LAB — CBC
Platelets: 249 10*3/uL (ref 150–400)
RDW: 13.2 % (ref 11.5–15.5)
WBC: 8.7 10*3/uL (ref 4.0–10.5)

## 2011-11-24 LAB — SURGICAL PCR SCREEN: Staphylococcus aureus: POSITIVE — AB

## 2011-11-24 LAB — HCG, SERUM, QUALITATIVE: Preg, Serum: NEGATIVE

## 2011-11-24 NOTE — Progress Notes (Signed)
1040am on 11/24/11- After blood draw in lab patient stated she felt " little shaky" .  Patient completed protein drink she had been drinking during preop visit with nurse.  Blood pressue- 117/75 .  Obtained patient orange juice x 2 which she drank.  Blood pressure at 1045 am was 81/53 sitting.  At 1047 am 114/79 patient states feeling better.  At 1052 am 107/71, patient voices no complaints states feels better.  1100am blood pressure 115/75.  Obtained patient orange juice x 2 to take with her.  Patient walked to main lobby .  Patient voiced no complaints. Nurse instructed patient to call once at [facility] to eat lunch.  At 1115am - patient called to say she was at del[employer] to eat lunch.  No complaints.  At 1200noon nurse called patient again and patient voiced no complaints. Confirmed she was at therapist appointment at 1200noon by office.

## 2011-11-24 NOTE — Progress Notes (Signed)
1255pm on 11/24/11 spoke with patient regarding status. Patient voiced no complaints. Instructed patient again to contact endocrinologist regarding blood glucose.  Patient voiced understanding and stated she would prior to surgery.

## 2011-11-24 NOTE — Progress Notes (Signed)
Lab results done 8/19 13 on chart from PCP office.  - CMET

## 2011-11-24 NOTE — Patient Instructions (Signed)
20 [name]  11/24/2011   Your procedure is scheduled on:  11/30/11 [id number]  Report to [facility] at 0515 AM.  Call this number if you have problems the morning of surgery: [phone]   Remember:   Do not eat food:After Midnight.  May have clear liquids:until Midnight .    Take these medicines the morning of surgery with A SIP OF WATER:    Do not wear jewelry, make-up or nail polish.  Do not wear lotions, powders, or perfumes. You may wear deodorant.  Do not shave 48 hours prior to surgery. .  Do not bring valuables to the hospital.  Contacts, dentures or bridgework may not be worn into surgery.  Leave suitcase in the car. After surgery it may be brought to your room.  For patients admitted to the hospital, checkout time is 11:00 AM the day of discharge.       Special Instructions: CHG Shower Use Special Wash: 1/2 bottle night before surgery and 1/2 bottle morning of surgery. shower chin to toes with CHG.  Wash face and private parts with regular soap.    Please read over the following fact sheets that you were given: MRSA Information, coughing and deep breathing exercises, leg exercises

## 2011-11-26 ENCOUNTER — Ambulatory Visit (INDEPENDENT_AMBULATORY_CARE_PROVIDER_SITE_OTHER): Payer: BC Managed Care – PPO | Admitting: General Surgery

## 2011-11-26 NOTE — Progress Notes (Signed)
Patient called nurse today to state that she had contacted endocrinologist regarding blood glucose issues.  She stated he told her to decrease insulin dosage.  If insulin dosage does change she will inform nurse on day of surgery.

## 2011-11-27 ENCOUNTER — Ambulatory Visit (INDEPENDENT_AMBULATORY_CARE_PROVIDER_SITE_OTHER): Payer: BC Managed Care – PPO | Admitting: General Surgery

## 2011-11-27 ENCOUNTER — Encounter (INDEPENDENT_AMBULATORY_CARE_PROVIDER_SITE_OTHER): Payer: Self-pay | Admitting: General Surgery

## 2011-11-27 DIAGNOSIS — Z6841 Body Mass Index (BMI) 40.0 and over, adult: Secondary | ICD-10-CM | POA: Diagnosis not present

## 2011-11-27 NOTE — Progress Notes (Signed)
Chief complaint: Morbid obesity, preoperative gastric bypass  History: Patient returns for a preop visit prior to planned laparoscopic Roux-en-Y gastric bypass for morbid obesity with multiple comorbidities including insulin-dependent diabetes mellitus, dyslipidemia and obstructive sleep apnea. Reviewed all her preoperative studies. Concerns vesica nutrition evaluation. Her preoperative lab work showed just expected abnormalities. We reviewed the consent form and all her questions were answered. Past Medical History  Diagnosis Date  . Depression   . Anxiety   . Diabetes mellitus   . GERD (gastroesophageal reflux disease)   . ADD (attention deficit disorder)   . Hyperlipemia   . Herniated disc   . Allergy     Seasonal  . Diabetic neuropathy   . Shortness of breath     with exertion   . Sleep apnea     obsructive settings at 13   . Heart murmur     asa child  . Seizures     hx of / month ago - dehydration   . 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 Cholecystectomy   . Cesarean section   . Tonsillectomy   . Laporoscopy 2007  . Breath tek h pylori 09/11/2011    Procedure: BREATH TEK H PYLORI;  Surgeon: Mariella Saa, MD;  Location: [facility];  Service: General;  Laterality: N/A;   Current Outpatient Prescriptions  Medication Sig Dispense Refill  . ALPRAZolam (XANAX) 1 MG tablet Take 1 mg by mouth 4 (four) times daily.      Marland Kitchen amphetamine-dextroamphetamine (ADDERALL) 30 MG tablet Take 30 mg by mouth 2 (two) times daily. 1 tablet at 8 am and 1 tablet at 12 noon      . atorvastatin (LIPITOR) 20 MG tablet Take 20 mg by mouth every evening.       . clobetasol (TEMOVATE) 0.05 % external solution 1 application 2 (two) times daily.       . cyclobenzaprine (FLEXERIL) 10 MG tablet Take 10 mg by mouth 3 (three) times daily as needed. For headaches      . fenofibrate micronized (LOFIBRA) 200  MG capsule Take 200 mg by mouth at bedtime.       . insulin aspart (NOVOLOG) 100 UNIT/ML injection Inject 8-12 Units into the skin 3 (three) times daily before meals. 10units breakfast 8units lunch 12 units dinnertime.      . insulin glargine (LANTUS) 100 UNIT/ML injection Inject 48 Units into the skin daily after supper.       Marland Kitchen ketoconazole (NIZORAL) 2 % shampoo Apply 1 application topically daily.       Marland Kitchen loratadine (CLARITIN) 10 MG tablet Take 10 mg by mouth daily.      . Metformin HCl (RIOMET) 500 MG/5ML SOLN Take 5-10 mLs by mouth 2 (two) times daily. Takes 5 ml in the morning and 10 mls in the evening      . Multiple Vitamin (MULTIVITAMIN WITH MINERALS) TABS Take 1 tablet by mouth daily.      . mupirocin ointment (BACTROBAN) 2 %       . NASONEX 50 MCG/ACT nasal spray Place 1 spray into the nose daily.       . nortriptyline (PAMELOR) 10 MG/5ML solution Take 150 mg by mouth at bedtime.       Letta Pate DELICA LANCETS MISC       . perphenazine (TRILAFON) 4 MG tablet Take 4 mg  by mouth 3 (three) times daily.      . predniSONE (DELTASONE) 20 MG tablet       . PROAIR HFA 108 (90 BASE) MCG/ACT inhaler 2 puffs every 6 (six) hours as needed. S.O.B      . promethazine (PHENERGAN) 6.25 MG/5ML syrup Take 25 mg by mouth 4 (four) times daily as needed. Pt has both injection and liquid at home      . Promethazine HCl (PHENERGAN IJ) Inject 25 mg as directed 4 (four) times daily as needed. Nausea      . topiramate (TOPAMAX) 25 MG capsule Take 100 mg by mouth as directed. Open capsules and mix in food/drink and consume all at once Patient takes am and pm      . verapamil (CALAN) 40 MG tablet Take 120 mg by mouth 2 (two) times daily.        Allergies  Allergen Reactions  . Atarax (Hydroxyzine Hcl) Anaphylaxis  . Cephalosporins Anaphylaxis  . Codeine Anaphylaxis  . Compazine Anaphylaxis  . Decadron (Dexamethasone) Anaphylaxis  . Demerol Anaphylaxis  . Metoclopramide Hcl Anaphylaxis  . Morphine And  Related Anaphylaxis  . Penicillins Anaphylaxis  . Sulfa Antibiotics Anaphylaxis  . Triptans Anaphylaxis  . Zofran Anaphylaxis  . Pork-Derived Products Other (See Comments)    Religous reasons  . Shellfish-Derived Products Other (See Comments)    Religous reasons   History  Substance Use Topics  . Smoking status: Never Smoker   . Smokeless tobacco: Never Used  . Alcohol Use: No   Exam: BP 122/84  Pulse 78  Temp 97.6 F (36.4 C) (Temporal)  Resp 18  Ht 5\' 7"  (1.702 m)  Wt 286 lb 3.2 oz (129.819 kg)  BMI 44.83 kg/m2  LMP 11/23/2011 General: Alert, morbidly obese Caucasian female, in no distress  Skin: Warm and dry without rash or infection.  HEENT: No palpable masses or thyromegaly. Sclera nonicteric. Pupils equal round and reactive. Oropharynx clear.  Lymph nodes: No cervical, supraclavicular, or inguinal nodes palpable.  Lungs: Breath sounds clear and equal without increased work of breathing  Cardiovascular: Regular rate and rhythm without murmur. No JVD or edema. Peripheral pulses intact.  Abdomen: Nondistended. Soft and nontender. No masses palpable. No organomegaly. No palpable hernias.  Extremities: No edema or joint swelling or deformity. No chronic venous stasis changes.  Neurologic: Alert and fully oriented. Gait normal.   Assessment and plan: Proceed as planned laparoscopic Roux-en-Y gastric bypass the

## 2011-11-30 ENCOUNTER — Encounter (HOSPITAL_COMMUNITY)
Admission: RE | Disposition: A | Discharge status: Home / Self Care | Payer: Self-pay | Source: Ambulatory Visit | Attending: General Surgery

## 2011-11-30 ENCOUNTER — Inpatient Hospital Stay (HOSPITAL_COMMUNITY)
Admission: RE | Admit: 2011-11-30 | Discharge: 2011-12-03 | DRG: 288 | Disposition: A | Discharge status: Home / Self Care | Payer: BC Managed Care – PPO | Source: Ambulatory Visit | Attending: General Surgery | Admitting: General Surgery

## 2011-11-30 ENCOUNTER — Inpatient Hospital Stay (HOSPITAL_COMMUNITY): Payer: BC Managed Care – PPO | Admitting: Anesthesiology

## 2011-11-30 ENCOUNTER — Encounter (HOSPITAL_COMMUNITY): Payer: Self-pay | Admitting: Anesthesiology

## 2011-11-30 ENCOUNTER — Encounter (HOSPITAL_COMMUNITY): Payer: Self-pay | Admitting: *Deleted

## 2011-11-30 DIAGNOSIS — G4730 Sleep apnea, unspecified: Secondary | ICD-10-CM | POA: Diagnosis present

## 2011-11-30 DIAGNOSIS — E119 Type 2 diabetes mellitus without complications: Secondary | ICD-10-CM | POA: Diagnosis present

## 2011-11-30 DIAGNOSIS — Z01812 Encounter for preprocedural laboratory examination: Secondary | ICD-10-CM

## 2011-11-30 DIAGNOSIS — Z6841 Body Mass Index (BMI) 40.0 and over, adult: Secondary | ICD-10-CM

## 2011-11-30 DIAGNOSIS — G4733 Obstructive sleep apnea (adult) (pediatric): Secondary | ICD-10-CM

## 2011-11-30 HISTORY — PX: BOWEL RESECTION: SHX1257

## 2011-11-30 HISTORY — PX: GASTRIC ROUX-EN-Y: SHX5262

## 2011-11-30 LAB — HEMOGLOBIN AND HEMATOCRIT, BLOOD: HCT: 37.5 % (ref 36.0–46.0)

## 2011-11-30 LAB — GLUCOSE, CAPILLARY
Glucose-Capillary: 145 mg/dL — ABNORMAL HIGH (ref 70–99)
Glucose-Capillary: 97 mg/dL (ref 70–99)

## 2011-11-30 SURGERY — LAPAROSCOPIC ROUX-EN-Y GASTRIC
Anesthesia: General | Wound class: Clean Contaminated

## 2011-11-30 MED ORDER — FENTANYL CITRATE 0.05 MG/ML IJ SOLN
INTRAMUSCULAR | Status: DC | PRN
  Administered 2011-11-30 (×2): 50 ug via INTRAVENOUS
  Administered 2011-11-30: 100 ug via INTRAVENOUS
  Administered 2011-11-30 (×6): 50 ug via INTRAVENOUS

## 2011-11-30 MED ORDER — KETOROLAC TROMETHAMINE 30 MG/ML IJ SOLN: 15.0000 mg | Freq: Once | INTRAMUSCULAR | Status: DC | PRN

## 2011-11-30 MED ORDER — POTASSIUM CHLORIDE IN NACL 20-0.9 MEQ/L-% IV SOLN
INTRAVENOUS | Status: DC
  Administered 2011-11-30 – 2011-12-02 (×4): via INTRAVENOUS
  Administered 2011-12-02: 125 mL/h via INTRAVENOUS
  Administered 2011-12-02 – 2011-12-03 (×2): via INTRAVENOUS
  Filled 2011-11-30 (×10): qty 1000

## 2011-11-30 MED ORDER — 0.9 % SODIUM CHLORIDE (POUR BTL) OPTIME
TOPICAL | Status: DC | PRN
  Administered 2011-11-30: 1000 mL

## 2011-11-30 MED ORDER — HYDROMORPHONE HCL PF 1 MG/ML IJ SOLN
1.0000 mg | INTRAMUSCULAR | Status: DC | PRN
  Administered 2011-11-30 (×3): 1 mg via INTRAVENOUS
  Administered 2011-11-30 – 2011-12-01 (×4): 2 mg via INTRAVENOUS
  Administered 2011-12-01 (×2): 1 mg via INTRAVENOUS
  Administered 2011-12-01 – 2011-12-02 (×3): 2 mg via INTRAVENOUS
  Filled 2011-11-30: qty 2
  Filled 2011-11-30: qty 1
  Filled 2011-11-30: qty 2
  Filled 2011-11-30: qty 1
  Filled 2011-11-30: qty 2
  Filled 2011-11-30: qty 1
  Filled 2011-11-30 (×3): qty 2
  Filled 2011-11-30 (×2): qty 1
  Filled 2011-11-30: qty 2

## 2011-11-30 MED ORDER — ACETAMINOPHEN 160 MG/5ML PO SOLN: 650.0000 mg | ORAL | Status: DC | PRN

## 2011-11-30 MED ORDER — VITAMINS A & D EX OINT
TOPICAL_OINTMENT | CUTANEOUS | Status: AC
  Administered 2011-11-30: 16:00:00
  Filled 2011-11-30: qty 5

## 2011-11-30 MED ORDER — HEPARIN SODIUM (PORCINE) 5000 UNIT/ML IJ SOLN
5000.0000 [IU] | INTRAMUSCULAR | Status: AC
  Administered 2011-11-30: 5000 [IU] via SUBCUTANEOUS
  Filled 2011-11-30: qty 1

## 2011-11-30 MED ORDER — TISSEEL VH 10 ML EX KIT
PACK | CUTANEOUS | Status: DC | PRN
  Administered 2011-11-30: 10 mL

## 2011-11-30 MED ORDER — UNJURY VANILLA POWDER
2.0000 [oz_av] | Freq: Four times a day (QID) | ORAL | Status: DC
  Administered 2011-12-02 – 2011-12-03 (×5): 2 [oz_av] via ORAL

## 2011-11-30 MED ORDER — LACTATED RINGERS IV SOLN: INTRAVENOUS | Status: DC

## 2011-11-30 MED ORDER — LACTATED RINGERS IR SOLN
Status: DC | PRN
  Administered 2011-11-30: 3000 mL

## 2011-11-30 MED ORDER — NEOSTIGMINE METHYLSULFATE 1 MG/ML IJ SOLN
INTRAMUSCULAR | Status: DC | PRN
  Administered 2011-11-30: 4 mg via INTRAVENOUS

## 2011-11-30 MED ORDER — ACETAMINOPHEN 10 MG/ML IV SOLN
INTRAVENOUS | Status: DC | PRN
  Administered 2011-11-30: 1000 mg via INTRAVENOUS

## 2011-11-30 MED ORDER — INSULIN ASPART 100 UNIT/ML ~~LOC~~ SOLN
0.0000 [IU] | SUBCUTANEOUS | Status: DC
  Administered 2011-11-30 – 2011-12-02 (×5): 2 [IU] via SUBCUTANEOUS

## 2011-11-30 MED ORDER — OXYCODONE-ACETAMINOPHEN 5-325 MG/5ML PO SOLN
5.0000 mL | ORAL | Status: DC | PRN
  Administered 2011-12-01 – 2011-12-03 (×8): 10 mL via ORAL
  Filled 2011-11-30 (×8): qty 10

## 2011-11-30 MED ORDER — PROMETHAZINE HCL 25 MG/ML IJ SOLN
INTRAMUSCULAR | Status: DC | PRN
  Administered 2011-11-30: 12.5 mg via INTRAVENOUS

## 2011-11-30 MED ORDER — MOXIFLOXACIN HCL IN NACL 400 MG/250ML IV SOLN
400.0000 mg | INTRAVENOUS | Status: AC
  Administered 2011-11-30: 400 mg via INTRAVENOUS
  Filled 2011-11-30: qty 250

## 2011-11-30 MED ORDER — UNJURY CHOCOLATE CLASSIC POWDER: 2.0000 [oz_av] | Freq: Four times a day (QID) | ORAL | Status: DC

## 2011-11-30 MED ORDER — PROPOFOL 10 MG/ML IV BOLUS
INTRAVENOUS | Status: DC | PRN
  Administered 2011-11-30: 200 mg via INTRAVENOUS

## 2011-11-30 MED ORDER — BUPIVACAINE-EPINEPHRINE 0.25% -1:200000 IJ SOLN
INTRAMUSCULAR | Status: DC | PRN
  Administered 2011-11-30: 35 mL

## 2011-11-30 MED ORDER — PROMETHAZINE HCL 25 MG/ML IJ SOLN: 6.2500 mg | INTRAMUSCULAR | Status: DC | PRN

## 2011-11-30 MED ORDER — GLYCOPYRROLATE 0.2 MG/ML IJ SOLN
INTRAMUSCULAR | Status: DC | PRN
  Administered 2011-11-30: .7 mg via INTRAVENOUS

## 2011-11-30 MED ORDER — PROMETHAZINE HCL 25 MG/ML IJ SOLN
12.5000 mg | INTRAMUSCULAR | Status: DC | PRN
  Administered 2011-11-30 – 2011-12-03 (×15): 12.5 mg via INTRAVENOUS
  Filled 2011-11-30 (×15): qty 1

## 2011-11-30 MED ORDER — HEPARIN SODIUM (PORCINE) 5000 UNIT/ML IJ SOLN
5000.0000 [IU] | Freq: Three times a day (TID) | INTRAMUSCULAR | Status: DC
  Administered 2011-11-30 – 2011-12-03 (×8): 5000 [IU] via SUBCUTANEOUS
  Filled 2011-11-30 (×11): qty 1

## 2011-11-30 MED ORDER — LIDOCAINE HCL (CARDIAC) 20 MG/ML IV SOLN
INTRAVENOUS | Status: DC | PRN
  Administered 2011-11-30: 50 mg via INTRAVENOUS

## 2011-11-30 MED ORDER — HYDROMORPHONE HCL PF 1 MG/ML IJ SOLN
INTRAMUSCULAR | Status: DC | PRN
  Administered 2011-11-30 (×2): 1 mg via INTRAVENOUS

## 2011-11-30 MED ORDER — STERILE WATER FOR IRRIGATION IR SOLN
Status: DC | PRN
  Administered 2011-11-30: 1000 mL

## 2011-11-30 MED ORDER — INSULIN GLARGINE 100 UNIT/ML ~~LOC~~ SOLN
15.0000 [IU] | Freq: Every day | SUBCUTANEOUS | Status: DC
  Administered 2011-12-01 – 2011-12-02 (×2): 15 [IU] via SUBCUTANEOUS

## 2011-11-30 MED ORDER — UNJURY CHICKEN SOUP POWDER: 2.0000 [oz_av] | Freq: Four times a day (QID) | ORAL | Status: DC

## 2011-11-30 MED ORDER — LACTATED RINGERS IV SOLN
INTRAVENOUS | Status: DC | PRN
  Administered 2011-11-30 (×2): via INTRAVENOUS

## 2011-11-30 MED ORDER — ROCURONIUM BROMIDE 100 MG/10ML IV SOLN
INTRAVENOUS | Status: DC | PRN
  Administered 2011-11-30 (×3): 10 mg via INTRAVENOUS
  Administered 2011-11-30: 20 mg via INTRAVENOUS
  Administered 2011-11-30: 10 mg via INTRAVENOUS
  Administered 2011-11-30: 50 mg via INTRAVENOUS

## 2011-11-30 MED ORDER — MENTHOL 3 MG MT LOZG
1.0000 | LOZENGE | OROMUCOSAL | Status: DC | PRN
  Filled 2011-11-30 (×2): qty 9

## 2011-11-30 MED ORDER — HYDROMORPHONE HCL PF 1 MG/ML IJ SOLN
0.2500 mg | INTRAMUSCULAR | Status: DC | PRN
  Administered 2011-11-30 (×2): 0.5 mg via INTRAVENOUS

## 2011-11-30 MED ORDER — SUCCINYLCHOLINE CHLORIDE 20 MG/ML IJ SOLN
INTRAMUSCULAR | Status: DC | PRN
  Administered 2011-11-30: 100 mg via INTRAVENOUS

## 2011-11-30 MED ORDER — MIDAZOLAM HCL 5 MG/5ML IJ SOLN
INTRAMUSCULAR | Status: DC | PRN
  Administered 2011-11-30: 2 mg via INTRAVENOUS

## 2011-11-30 SURGICAL SUPPLY — 73 items
APPLICATOR COTTON TIP 6IN STRL (MISCELLANEOUS) IMPLANT
APPLIER CLIP ROT 13.4 12 LRG (CLIP)
BENZOIN TINCTURE PRP APPL 2/3 (GAUZE/BANDAGES/DRESSINGS) IMPLANT
BLADE SURG 15 STRL LF DISP TIS (BLADE) IMPLANT
BLADE SURG 15 STRL SS (BLADE)
BLADE SURG SZ11 CARB STEEL (BLADE) ×3 IMPLANT
CABLE HIGH FREQUENCY MONO STRZ (ELECTRODE) ×3 IMPLANT
CANISTER SUCTION 2500CC (MISCELLANEOUS) ×3 IMPLANT
CHLORAPREP W/TINT 26ML (MISCELLANEOUS) ×9 IMPLANT
CLIP APPLIE ROT 13.4 12 LRG (CLIP) IMPLANT
CLIP SUT LAPRA TY ABSORB (SUTURE) ×6 IMPLANT
CLOTH BEACON ORANGE TIMEOUT ST (SAFETY) ×3 IMPLANT
COVER SURGICAL LIGHT HANDLE (MISCELLANEOUS) ×3 IMPLANT
CUTTER LINEAR ENDO ART 45 ETS (STAPLE) ×3 IMPLANT
DECANTER SPIKE VIAL GLASS SM (MISCELLANEOUS) ×6 IMPLANT
DERMABOND ADVANCED (GAUZE/BANDAGES/DRESSINGS) ×1
DERMABOND ADVANCED .7 DNX12 (GAUZE/BANDAGES/DRESSINGS) ×2 IMPLANT
DEVICE SUTURE ENDOST 10MM (ENDOMECHANICALS) ×3 IMPLANT
DISSECTOR BLUNT TIP ENDO 5MM (MISCELLANEOUS) IMPLANT
DRAIN PENROSE 18X1/4 LTX STRL (WOUND CARE) ×3 IMPLANT
DRAPE CAMERA CLOSED 9X96 (DRAPES) ×3 IMPLANT
ELECT REM PT RETURN 9FT ADLT (ELECTROSURGICAL) ×3
ELECTRODE REM PT RTRN 9FT ADLT (ELECTROSURGICAL) ×2 IMPLANT
GAUZE SPONGE 4X4 16PLY XRAY LF (GAUZE/BANDAGES/DRESSINGS) ×3 IMPLANT
GLOVE BIOGEL PI IND STRL 7.0 (GLOVE) ×2 IMPLANT
GLOVE BIOGEL PI INDICATOR 7.0 (GLOVE) ×1
GOWN STRL NON-REIN LRG LVL3 (GOWN DISPOSABLE) IMPLANT
GOWN STRL REIN XL XLG (GOWN DISPOSABLE) ×12 IMPLANT
HEMOSTAT SURGICEL 4X8 (HEMOSTASIS) IMPLANT
HOVERMATT SINGLE USE (MISCELLANEOUS) ×3 IMPLANT
KIT BASIN OR (CUSTOM PROCEDURE TRAY) ×3 IMPLANT
KIT GASTRIC LAVAGE 34FR ADT (SET/KITS/TRAYS/PACK) ×3 IMPLANT
NEEDLE SPNL 22GX3.5 QUINCKE BK (NEEDLE) ×3 IMPLANT
NS IRRIG 1000ML POUR BTL (IV SOLUTION) ×3 IMPLANT
PACK CARDIOVASCULAR III (CUSTOM PROCEDURE TRAY) ×3 IMPLANT
PEN SKIN MARKING BROAD (MISCELLANEOUS) ×3 IMPLANT
POUCH SPECIMEN RETRIEVAL 10MM (ENDOMECHANICALS) IMPLANT
RELOAD 45 VASCULAR/THIN (ENDOMECHANICALS) ×3 IMPLANT
RELOAD BLUE (STAPLE) ×9 IMPLANT
RELOAD ENDO STITCH 2.0 (ENDOMECHANICALS) ×9
RELOAD GOLD (STAPLE) ×3 IMPLANT
RELOAD STAPLE TA45 3.5 REG BLU (ENDOMECHANICALS) ×3 IMPLANT
RELOAD WHITE ECR60W (STAPLE) ×6 IMPLANT
SCALPEL HARMONIC ACE (MISCELLANEOUS) ×3 IMPLANT
SCISSORS LAP 5X35 DISP (ENDOMECHANICALS) ×3 IMPLANT
SEALANT SURGICAL APPL DUAL CAN (MISCELLANEOUS) ×3 IMPLANT
SET IRRIG TUBING LAPAROSCOPIC (IRRIGATION / IRRIGATOR) ×3 IMPLANT
SLEEVE ADV FIXATION 12X100MM (TROCAR) ×6 IMPLANT
SLEEVE ADV FIXATION 5X100MM (TROCAR) ×3 IMPLANT
SOLUTION ANTI FOG 6CC (MISCELLANEOUS) ×3 IMPLANT
SPONGE GAUZE 4X4 12PLY (GAUZE/BANDAGES/DRESSINGS) IMPLANT
STAPLER STANDARD HANDLE (STAPLE) ×3 IMPLANT
STAPLER VISISTAT 35W (STAPLE) ×3 IMPLANT
STRIP CLOSURE SKIN 1/2X4 (GAUZE/BANDAGES/DRESSINGS) IMPLANT
SUT ETHILON 3 0 PS 1 (SUTURE) IMPLANT
SUT MNCRL AB 4-0 PS2 18 (SUTURE) ×3 IMPLANT
SUT RELOAD ENDO STITCH 2 48X1 (ENDOMECHANICALS) ×10
SUT RELOAD ENDO STITCH 2.0 (ENDOMECHANICALS) ×8
SUT VIC AB 2-0 SH 27 (SUTURE) ×1
SUT VIC AB 2-0 SH 27X BRD (SUTURE) ×2 IMPLANT
SUTURE RELOAD END STTCH 2 48X1 (ENDOMECHANICALS) ×10 IMPLANT
SUTURE RELOAD ENDO STITCH 2.0 (ENDOMECHANICALS) ×8 IMPLANT
SYR 20CC LL (SYRINGE) ×3 IMPLANT
SYR 50ML LL SCALE MARK (SYRINGE) ×3 IMPLANT
SYR CONTROL 10ML LL (SYRINGE) ×3 IMPLANT
TOWEL OR 17X26 10 PK STRL BLUE (TOWEL DISPOSABLE) ×3 IMPLANT
TRAY FOLEY CATH 14FRSI W/METER (CATHETERS) ×3 IMPLANT
TROCAR ADV FIXATION 12X100MM (TROCAR) ×3 IMPLANT
TROCAR XCEL 12X100 BLDLESS (ENDOMECHANICALS) ×3 IMPLANT
TROCAR Z-THREAD FIOS 5X100MM (TROCAR) ×3 IMPLANT
TUBING ENDO SMARTCAP (MISCELLANEOUS) ×3 IMPLANT
TUBING FILTER THERMOFLATOR (ELECTROSURGICAL) ×3 IMPLANT
WATER STERILE IRR 1500ML POUR (IV SOLUTION) ×3 IMPLANT

## 2011-11-30 NOTE — Transfer of Care (Signed)
Immediate Anesthesia Transfer of Care Note  Patient: [name]  Procedure(s) Performed: Procedure(s) (LRB): LAPAROSCOPIC ROUX-EN-Y GASTRIC (N/A) UPPER GI ENDOSCOPY () SMALL BOWEL RESECTION ()  Patient Location: PACU  Anesthesia Type: General  Level of Consciousness: sedated, patient cooperative and responds to stimulaton  Airway & Oxygen Therapy: Patient Spontanous Breathing and Patient connected to face mask oxgen  Post-op Assessment: Report given to PACU RN and Post -op Vital signs reviewed and stable  Post vital signs: Reviewed and stable  Complications: No apparent anesthesia complications

## 2011-11-30 NOTE — Op Note (Signed)
Preop diagnosis: Morbid obesity  Postop diagnosis: Morbid obesity  Body mass index is 43.85 kg/(m^2).  Surgical procedure: Laparoscopic Roux-en-Y gastric bypass with small bowel resection  Surgeon: Sharlet Salina T.Maximilian Tallo M.D.  Asst.: Lodema Pilot D.O.  Anesthesia: General  Complications:  None  EBL: Minimal  Drains: None  Disposition: PACU in good condition  Description of procedure: Patient is brought to the operating room and general anesthesia induced. She had received preoperative broad-spectrum IV antibiotics and subcutaneous heparin. The abdomen was widely sterilely prepped and draped. Patient timeout was performed and correct patient and procedure confirmed. Access was obtained with a 12 mm Optiview trocar in the left upper quadrant and pneumoperitoneum established without difficulty. Under direct vision 12 mm trocars were placed laterally in the right upper quadrant, right upper quadrant midclavicular line, and to the left and above the umbilicus for the camera port. A 5 mm trocar was placed laterally in the left upper quadrant. The omentum was brought into the upper abdomen and the transverse mesocolon elevated and the ligament of Treitz clearly identified. A 40 cm biliopancreatic limb was then carefully measured from the ligament of Treitz.  Just distal to this was an approximately 1 cm discreet mass in the wall of the small intestine.  We elected to resect this.The small intestine was divided at this point with a single firing of the white load linear stapler. A very short segment containing the mass was then resected from the end of the Roux limb with an additional firing of the white load Echelon stapler, placed in an Endocatch bag and removed. A Penrose drain was sutured to the end of the Roux-en-Y limb for later identification. A 100 cm Roux-en-Y limb was then carefully measured. At this point a side-to-side anastomosis was created between the Roux limb and the end of the  biliopancreatic limb. This was accomplished with a single firing of the 45 mm white load linear stapler. The common enterotomy was closed with a running 2-0 Vicryl begun at either end of the enterotomy and tied centrally. The mesenteric defect was then closed with running 2-0 silk. The omentum was then divided with the harmonic scalpel up towards the transverse colon to allow mobility of the Roux limb toward the gastric pouch. The patient was then placed in steep reversed Trendelenburg. Through a 5 mm subxiphoid site the [facility] retractor was placed and the left lobe of the liver elevated with excellent exposure of the upper stomach and hiatus. The angle of Hiss was then mobilized with the harmonic scalpel. A 4 cm gastric pouch was then carefully measured along the lesser curve of the stomach. Dissection was carried along the lesser curve at this point with the Harmonic scalpel working carefully back toward the lesser sac at right angles to the lesser curve. The free lesser sac was then entered. After being sure all tubes were removed from the stomach an initial firing of the gold load 60 mm linear stapler was fired at right angles across the lesser curve for about 4 cm. The gastric pouch was further mobilized posteriorly and then the pouch was completed with 2 further firings of the 60 mm blue load linear stapler up through the previously dissected angle of His. It was ensured that the pouch was completely mobilized away from the gastric remnant. This created a nice tubular 4-5 cm gastric pouch. The staple line of the gastric remnant was then oversewn with 2-0 silk for hemostasis. The Roux limb was then brought up in an antecolic fashion  with the candycane facing to the patient's left without undue tension. The gastrojejunostomy was created with an initial posterior row of 2-0 Vicryl between the Roux limb and the staple line of the gastric pouch. Enterotomies were then made in the gastric pouch and the Roux limb  with the harmonic scalpel and at approximately 2-2-1/2 cm anastomosis was created with a single firing of the blue load linear stapler. The staple line was inspected and was intact without bleeding. The common enterotomy was then closed with running 2-0 Vicryl begun at either end and tied centrally. The wall tube was then easily passed through the anastomosis and an outer anterior layer of running 2-0 Vicryl was placed. The Ewald tube was removed. With the outlet of the gastrojejunostomy clamped and under saline irrigation the assistant performed upper endoscopy and with the gastric pouch tensely distended with air there was no evidence of leak. The pouch was desufflated. The Vonita Moss defect was closed with running 2-0 silk. The abdomen was inspected for any evidence of bleeding or bowel injury and everything looked fine. The Nathanson retractor was removed under direct vision after coating the anastomosis with Tisseel tissue sealant. All CO2 was evacuated and trochars removed. Skin incisions were closed with staples. Sponge needle and instrument counts were correct. The patient was taken to the PACU in good condition.     Mariella Saa MD, FACS  11/30/2011, 10:37 AM

## 2011-11-30 NOTE — Progress Notes (Signed)
Patient has ambulated in the room to the bathroom.

## 2011-11-30 NOTE — Interval H&P Note (Signed)
History and Physical Interval Note:  11/30/2011 7:16 AM  [name]  has presented today for surgery, with the diagnosis of morbid obesity   The various methods of treatment have been discussed with the patient and family. After consideration of risks, benefits and other options for treatment, the patient has consented to  Procedure(s) (LRB) with comments: LAPAROSCOPIC ROUX-EN-Y GASTRIC (N/A) as a surgical intervention .  The patient's history has been reviewed, patient examined, no change in status, stable for surgery.  I have reviewed the patient's chart and labs.  Questions were answered to the patient's satisfaction.     Roopa Graver T

## 2011-11-30 NOTE — H&P (View-Only) (Signed)
Chief complaint: Morbid obesity, preoperative gastric bypass  History: Patient returns for a preop visit prior to planned laparoscopic Roux-en-Y gastric bypass for morbid obesity with multiple comorbidities including insulin-dependent diabetes mellitus, dyslipidemia and obstructive sleep apnea. Reviewed all her preoperative studies. Concerns vesica nutrition evaluation. Her preoperative lab work showed just expected abnormalities. We reviewed the consent form and all her questions were answered. Past Medical History  Diagnosis Date  . Depression   . Anxiety   . Diabetes mellitus   . GERD (gastroesophageal reflux disease)   . ADD (attention deficit disorder)   . Hyperlipemia   . Herniated disc   . Allergy     Seasonal  . Diabetic neuropathy   . Shortness of breath     with exertion   . Sleep apnea     obsructive settings at 13   . Heart murmur     asa child  . Seizures     hx of / month ago - dehydration   . 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 Cholecystectomy   . Cesarean section   . Tonsillectomy   . Laporoscopy 2007  . Breath tek h pylori 09/11/2011    Procedure: BREATH TEK H PYLORI;  Surgeon: Victorina Kable T Freddy Kinne, MD;  Location: [facility];  Service: General;  Laterality: N/A;   Current Outpatient Prescriptions  Medication Sig Dispense Refill  . ALPRAZolam (XANAX) 1 MG tablet Take 1 mg by mouth 4 (four) times daily.      . amphetamine-dextroamphetamine (ADDERALL) 30 MG tablet Take 30 mg by mouth 2 (two) times daily. 1 tablet at 8 am and 1 tablet at 12 noon      . atorvastatin (LIPITOR) 20 MG tablet Take 20 mg by mouth every evening.       . clobetasol (TEMOVATE) 0.05 % external solution 1 application 2 (two) times daily.       . cyclobenzaprine (FLEXERIL) 10 MG tablet Take 10 mg by mouth 3 (three) times daily as needed. For headaches      . fenofibrate micronized (LOFIBRA) 200  MG capsule Take 200 mg by mouth at bedtime.       . insulin aspart (NOVOLOG) 100 UNIT/ML injection Inject 8-12 Units into the skin 3 (three) times daily before meals. 10units breakfast 8units lunch 12 units dinnertime.      . insulin glargine (LANTUS) 100 UNIT/ML injection Inject 48 Units into the skin daily after supper.       . ketoconazole (NIZORAL) 2 % shampoo Apply 1 application topically daily.       . loratadine (CLARITIN) 10 MG tablet Take 10 mg by mouth daily.      . Metformin HCl (RIOMET) 500 MG/5ML SOLN Take 5-10 mLs by mouth 2 (two) times daily. Takes 5 ml in the morning and 10 mls in the evening      . Multiple Vitamin (MULTIVITAMIN WITH MINERALS) TABS Take 1 tablet by mouth daily.      . mupirocin ointment (BACTROBAN) 2 %       . NASONEX 50 MCG/ACT nasal spray Place 1 spray into the nose daily.       . nortriptyline (PAMELOR) 10 MG/5ML solution Take 150 mg by mouth at bedtime.       . ONETOUCH DELICA LANCETS MISC       . perphenazine (TRILAFON) 4 MG tablet Take 4 mg   by mouth 3 (three) times daily.      . predniSONE (DELTASONE) 20 MG tablet       . PROAIR HFA 108 (90 BASE) MCG/ACT inhaler 2 puffs every 6 (six) hours as needed. S.O.B      . promethazine (PHENERGAN) 6.25 MG/5ML syrup Take 25 mg by mouth 4 (four) times daily as needed. Pt has both injection and liquid at home      . Promethazine HCl (PHENERGAN IJ) Inject 25 mg as directed 4 (four) times daily as needed. Nausea      . topiramate (TOPAMAX) 25 MG capsule Take 100 mg by mouth as directed. Open capsules and mix in food/drink and consume all at once Patient takes am and pm      . verapamil (CALAN) 40 MG tablet Take 120 mg by mouth 2 (two) times daily.        Allergies  Allergen Reactions  . Atarax (Hydroxyzine Hcl) Anaphylaxis  . Cephalosporins Anaphylaxis  . Codeine Anaphylaxis  . Compazine Anaphylaxis  . Decadron (Dexamethasone) Anaphylaxis  . Demerol Anaphylaxis  . Metoclopramide Hcl Anaphylaxis  . Morphine And  Related Anaphylaxis  . Penicillins Anaphylaxis  . Sulfa Antibiotics Anaphylaxis  . Triptans Anaphylaxis  . Zofran Anaphylaxis  . Pork-Derived Products Other (See Comments)    Religous reasons  . Shellfish-Derived Products Other (See Comments)    Religous reasons   History  Substance Use Topics  . Smoking status: Never Smoker   . Smokeless tobacco: Never Used  . Alcohol Use: No   Exam: BP 122/84  Pulse 78  Temp 97.6 F (36.4 C) (Temporal)  Resp 18  Ht 5' 7" (1.702 m)  Wt 286 lb 3.2 oz (129.819 kg)  BMI 44.83 kg/m2  LMP 11/23/2011 General: Alert, morbidly obese Caucasian female, in no distress  Skin: Warm and dry without rash or infection.  HEENT: No palpable masses or thyromegaly. Sclera nonicteric. Pupils equal round and reactive. Oropharynx clear.  Lymph nodes: No cervical, supraclavicular, or inguinal nodes palpable.  Lungs: Breath sounds clear and equal without increased work of breathing  Cardiovascular: Regular rate and rhythm without murmur. No JVD or edema. Peripheral pulses intact.  Abdomen: Nondistended. Soft and nontender. No masses palpable. No organomegaly. No palpable hernias.  Extremities: No edema or joint swelling or deformity. No chronic venous stasis changes.  Neurologic: Alert and fully oriented. Gait normal.   Assessment and plan: Proceed as planned laparoscopic Roux-en-Y gastric bypass the  

## 2011-11-30 NOTE — Anesthesia Postprocedure Evaluation (Signed)
  Anesthesia Post-op Note  Patient: [name]  Procedure(s) Performed: Procedure(s) (LRB): LAPAROSCOPIC ROUX-EN-Y GASTRIC (N/A) UPPER GI ENDOSCOPY () SMALL BOWEL RESECTION ()  Patient Location: PACU  Anesthesia Type: General  Level of Consciousness: awake and alert   Airway and Oxygen Therapy: Patient Spontanous Breathing  Post-op Pain: mild  Post-op Assessment: Post-op Vital signs reviewed, Patient's Cardiovascular Status Stable, Respiratory Function Stable, Patent Airway and No signs of Nausea or vomiting  Post-op Vital Signs: stable  Complications: No apparent anesthesia complications

## 2011-11-30 NOTE — Anesthesia Preprocedure Evaluation (Signed)
Anesthesia Evaluation  Patient identified by MRN, date of birth, ID band Patient awake    Reviewed: Allergy & Precautions, H&P , NPO status , Patient's Chart, lab work & pertinent test results  Airway Mallampati: III TM Distance: <3 FB Neck ROM: Full    Dental No notable dental hx.    Pulmonary sleep apnea and Continuous Positive Airway Pressure Ventilation ,    + decreased breath sounds      Cardiovascular negative cardio ROS  Rhythm:Regular Rate:Normal     Neuro/Psych negative neurological ROS  negative psych ROS   GI/Hepatic negative GI ROS, Neg liver ROS,   Endo/Other  diabetes, Oral Hypoglycemic AgentsMorbid obesity  Renal/GU negative Renal ROS  negative genitourinary   Musculoskeletal negative musculoskeletal ROS (+)   Abdominal   Peds negative pediatric ROS (+)  Hematology negative hematology ROS (+)   Anesthesia Other Findings   Reproductive/Obstetrics negative OB ROS                           Anesthesia Physical Anesthesia Plan  ASA: III  Anesthesia Plan: General   Post-op Pain Management:    Induction: Intravenous  Airway Management Planned: Oral ETT  Additional Equipment:   Intra-op Plan:   Post-operative Plan: Extubation in OR  Informed Consent: I have reviewed the patients History and Physical, chart, labs and discussed the procedure including the risks, benefits and alternatives for the proposed anesthesia with the patient or authorized representative who has indicated his/her understanding and acceptance.   Dental advisory given  Plan Discussed with: CRNA and Surgeon  Anesthesia Plan Comments:         Anesthesia Quick Evaluation

## 2011-12-01 ENCOUNTER — Inpatient Hospital Stay (HOSPITAL_COMMUNITY): Payer: BC Managed Care – PPO

## 2011-12-01 ENCOUNTER — Encounter (HOSPITAL_COMMUNITY): Payer: Self-pay | Admitting: General Surgery

## 2011-12-01 DIAGNOSIS — Z09 Encounter for follow-up examination after completed treatment for conditions other than malignant neoplasm: Secondary | ICD-10-CM | POA: Diagnosis not present

## 2011-12-01 LAB — CBC WITH DIFFERENTIAL/PLATELET
Basophils Relative: 0 % (ref 0–1)
Hemoglobin: 11.7 g/dL — ABNORMAL LOW (ref 12.0–15.0)
Lymphs Abs: 1.7 10*3/uL (ref 0.7–4.0)
Monocytes Relative: 5 % (ref 3–12)
Neutro Abs: 6.7 10*3/uL (ref 1.7–7.7)
Neutrophils Relative %: 75 % (ref 43–77)
RBC: 4.43 MIL/uL (ref 3.87–5.11)
WBC: 9 10*3/uL (ref 4.0–10.5)

## 2011-12-01 LAB — GLUCOSE, CAPILLARY
Glucose-Capillary: 101 mg/dL — ABNORMAL HIGH (ref 70–99)
Glucose-Capillary: 106 mg/dL — ABNORMAL HIGH (ref 70–99)
Glucose-Capillary: 150 mg/dL — ABNORMAL HIGH (ref 70–99)
Glucose-Capillary: 99 mg/dL (ref 70–99)

## 2011-12-01 MED ORDER — SODIUM CHLORIDE 0.9 % IJ SOLN: 10.0000 mL | INTRAMUSCULAR | Status: DC | PRN

## 2011-12-01 MED ORDER — NORTRIPTYLINE HCL 25 MG PO CAPS
100.0000 mg | ORAL_CAPSULE | Freq: Every day | ORAL | Status: DC
  Filled 2011-12-01 (×3): qty 4

## 2011-12-01 MED ORDER — NORTRIPTYLINE HCL 25 MG PO CAPS
50.0000 mg | ORAL_CAPSULE | Freq: Every day | ORAL | Status: DC
  Filled 2011-12-01 (×3): qty 2

## 2011-12-01 MED ORDER — IOHEXOL 300 MG/ML  SOLN: 20.0000 mL | Freq: Once | INTRAMUSCULAR | Status: AC | PRN

## 2011-12-01 MED FILL — Fibrin Sealant Component Kit 10 ML: CUTANEOUS | Qty: 1 | Status: AC

## 2011-12-01 NOTE — Progress Notes (Signed)
UGI results called to Dr. Johna Sheriff; orders received to begin POD #2 diet; Kennitrish, RN notified of results and orders. Talmadge Chad, RN Bariatric Nurse Coordinator

## 2011-12-01 NOTE — Progress Notes (Signed)
Patient ID: [name], female   DOB: Jul 22, 1975, 36 y.o.   MRN: [id number] 1 Day Post-Op  Subjective: Some epigastric pain, reasonably controlled with meds. Some HA, not severe.  IV access difficult  Objective: Vital signs in last 24 hours: Temp:  [97.5 F (36.4 C)-100.2 F (37.9 C)] 98.3 F (36.8 C) (09/10 0607) Pulse Rate:  [94-120] 102  (09/10 0607) Resp:  [10-18] 18  (09/10 0607) BP: (94-132)/(53-74) 101/65 mmHg (09/10 0607) SpO2:  [96 %-100 %] 100 % (09/10 0607) Weight:  [280 lb (127.007 kg)] 280 lb (127.007 kg) (09/09 1238)    Intake/Output from previous day: 09/09 0701 - 09/10 0700 In: 3575 [I.V.:3575] Out: 1730 [Urine:1730] Intake/Output this shift:    General appearance: alert and no distress Resp: clear to auscultation bilaterally GI: abnormal findings:  mild tenderness in the epigastrium Incision/Wound: Clean and dry  Lab Results:   Basename 12/01/11 0425 11/30/11 1928  WBC 9.0 --  HGB 11.7* 12.1  HCT 36.5 37.5  PLT 204 --   BMET No results found for this basename: NA:2,K:2,CL:2,CO2:2,GLUCOSE:2,BUN:2,CREATININE:2,CALCIUM:2 in the last 72 hours   Studies/Results: No results found.  Anti-infectives: Anti-infectives     Start     Dose/Rate Route Frequency Ordered Stop   11/30/11 0512   moxifloxacin (AVELOX) IVPB 400 mg        400 mg 250 mL/hr over 60 Minutes Intravenous 60 min pre-op 11/30/11 0512 11/30/11 0735          Assessment/Plan: s/p Procedure(s): LAPAROSCOPIC ROUX-EN-Y GASTRIC UPPER GI ENDOSCOPY SMALL BOWEL RESECTION Stable post op For gastrograffin swallow this AM Poor IV access- will request PICC line   LOS: 1 day    Doug Bucklin T 12/01/2011

## 2011-12-01 NOTE — Op Note (Signed)
NAMEEYLIN, PONTARELLI NO.:  [id number]  MEDICAL RECORD NO.:  [id number]  LOCATION:  1536                         FACILITY:  [facility]  PHYSICIAN:  Lodema Pilot, MD       DATE OF BIRTH:  [birth year]  DATE OF PROCEDURE:  11/30/2011 DATE OF DISCHARGE:                              OPERATIVE REPORT   PROCEDURE:  Intraoperative upper endoscopy.  PREOPERATIVE DIAGNOSIS:  Morbid obesity.  POSTOPERATIVE DIAGNOSIS:  Morbid obesity.  SURGEON:  Lodema Pilot, MD  ASSISTANT:  Lorne Skeens. Hoxworth, M.D.  ANESTHESIA:  General endotracheal anesthesia.  ESTIMATED BLOOD LOSS:  None.  FLUIDS:  Please see anesthesia record.  SPECIMENS:  None.  COMPLICATIONS:  None apparent.  FINDINGS:  Normal esophagus, normal appearing gastric pouch measuring 4- 5 cm, and no evidence of intraoperative air leak.  INDICATION FOR PROCEDURE:  Morbid obesity and need for intraoperative evaluation of the gastric pouch.  OPERATIVE DETAILS:  Ms. Gitto was already under general anesthesia for performance of a laparoscopic Roux-en-Y gastric bypass by Dr. Johna Sheriff. She was asked to perform intraoperative upper endoscopy for evaluation of gastric pouch.  After the gastrojejunal anastomosis was created, I passed a well-lubricated fiberoptic endoscope into the oropharynx and into the esophagus on the first attempt, and the scope was advanced down to the GE junction, which was located at approximately 43 cm from the incisors.  The Z-line appeared normal and the gastric pouch was insufflated and while the pouch was submerged under water and I insufflated the pouch, re-evaluated for any intraoperative air leakage and none was identified.  The gastric pouch appeared to be healthy in appearance and measured about 4-5 cm in length.  With the anastomosis at approximately 47 cm from the incisors, there was no evidence of intraluminal bleeding, and the scope was advanced through the anastomosis and into  the Roux limb.  There was no evidence of stricture at the anastomosis, and the air was then suctioned.  Then, the scope was withdrawn.  There were no apparent complications.  The patient tolerated the procedure and Dr. Johna Sheriff completed the procedure.  The remainder of the operative details, please refer to the dictated operative note.          ______________________________ Lodema Pilot, MD     BL/MEDQ  D:  11/30/2011  T:  12/01/2011  Job:  [id number]

## 2011-12-01 NOTE — Progress Notes (Signed)
Peripherally Inserted Central Catheter/Midline Placement  The IV Nurse has discussed with the patient and/or persons authorized to consent for the patient, the purpose of this procedure and the potential benefits and risks involved with this procedure.  The benefits include less needle sticks, lab draws from the catheter and patient may be discharged home with the catheter.  Risks include, but not limited to, infection, bleeding, blood clot (thrombus formation), and puncture of an artery; nerve damage and irregular heat beat.  Alternatives to this procedure were also discussed.  PICC/Midline Placement Documentation        [name] 12/01/2011, 4:29 PM

## 2011-12-01 NOTE — Progress Notes (Signed)
*  Preliminary Results* Bilateral lower extremity venous duplex completed. Bilateral lower extremities are negative for deep vein thrombosis.  12/01/2011 1:20 PM Gertie Fey, RDMS, RDCS

## 2011-12-01 NOTE — Care Management Note (Signed)
    Page 1 of 1   12/03/2011     11:46:58 AM   CARE MANAGEMENT NOTE 12/03/2011  Patient:  [name], [name]   Account Number:  [id number]  Date Initiated:  12/01/2011  Documentation initiated by:  Lorenda Ishihara  Subjective/Objective Assessment:   36 yo female admitted s/p gastric bypass with SB resection. PTA lived at home with spouse.     Action/Plan:   Anticipated DC Date:  12/02/2011   Anticipated DC Plan:  HOME/SELF CARE      DC Planning Services  CM consult      Choice offered to / List presented to:             Status of service:  Completed, signed off Medicare Important Message given?   (If response is "NO", the following Medicare IM given date fields will be blank) Date Medicare IM given:   Date Additional Medicare IM given:    Discharge Disposition:  HOME/SELF CARE  Per UR Regulation:  Reviewed for med. necessity/level of care/duration of stay  If discussed at Long Length of Stay Meetings, dates discussed:    Comments:

## 2011-12-01 NOTE — Progress Notes (Signed)
Pt alert and oriented; CPAP on; VSS; pt c/o headache, using ice pack to neck; remains NPO; awaiting UGI and doppler; c/o nausea but denies vomiting at this time; c/o abdominal pain and verbalized some relief with prn meds; denies flatus or BM; cathed earlier and not voided since; ambulating to room; using incentive spirometer; encouraged ambulation; notified RN of need for pain and nausea medication; pt already has follow up appts with [facility] and [facility]; discharge instructions given for pt to review. GASTRIC BYPASS/SLEEVE DISCHARGE INSTRUCTIONS  Drs. Fredrik Rigger, Hoxworth, Wilson, and  Call if you have any problems.   Call [phone] and ask for the surgeon on call.    If you need immediate assistance come to the ER at [facility] Tell the ER personnel that you are a new post-op gastric bypass patient. Signs and symptoms to report:   Severe vomiting or nausea. If you cannot tolerate clear liquids for longer than 1 day, you need to call your surgeon.    Abdominal pain which does not get better after taking your pain medication   Fever greater than 101 F degree   Difficulty breathing   Chest pain    Redness, swelling, drainage, or foul odor at incision sites    If your incisions open or pull apart   Swelling or pain in calf (lower leg)   Diarrhea, frequent watery, uncontrolled bowel movements.   Constipation, (no bowel movements for 3 days) if this occurs, Take Milk of Magnesia, 2 tablespoons by mouth, 3 times a day for 2 days if needed.  Call your doctor if constipation continues. Stop taking Milk of Magnesia once you have had a bowel movement. You may also use Miralax according to the label instructions.   Anything you consider "abnormal for you".   Normal side effects after Surgery:   Unable to sleep at night or concentrate   Irritability   Being tearful (crying) or depressed   These are common complaints, possibly related to your anesthesia, stress of surgery and change in  lifestyle, that usually go away a few weeks after surgery.  If these feelings continue, call your medical doctor.  Wound Care You may have surgical glue, steri-strips, or staples over your incisions after surgery.  Surgical glue:  Looks like a clear film over your incisions and will wear off gradually. Steri-strips: Strips of tape over your incisions. You may notice a yellowish color on the skin underneath the steri-strips. This is a substance used to make the steri-strips stick better. Do not pull the steri-strips off - let them fall off.  Staples: Cherlynn Polo may be removed before you leave the hospital. If you go home with staples, call [facility] [phone]) for an appointment with your surgeon's nurse to have staples removed in 7 - 10 days. Showering: You may shower two days after your surgery unless otherwise instructed by your surgeon. Wash gently around wounds with warm soapy water, rinse well, and gently pat dry.  If you have a drain, you may need someone to hold this while you shower. Avoid tub baths until staples are removed and incisions are healed.    Medications   Medications should be liquid or crushed if larger than the size of a dime.  Extended release pills should not be crushed.   Depending on the size and number of medications you take, you may need to stagger/change the time you take your medications so that you do not over-fill your pouch.    Make sure you  follow-up with your primary care physician to make medication adjustments needed during rapid weight loss and life-style adjustment.   If you are diabetic, follow up with the doctor that prescribes your diabetes medication(s) within one week after surgery and check your blood sugar regularly.   Do not drive while taking narcotics!   Do not take acetaminophen (Tylenol) and Roxicet or Lortab Elixir at the same time since these pain medications contain acetaminophen.  Diet at home: (First 2 Weeks) You will see the  nutritionist two weeks after your surgery. She will advance your diet if you are tolerating liquids well. Once at home, if you have severe vomiting or nausea and cannot tolerate clear liquids lasting longer than 1 day, call your surgeon.  Begin high protein shake 2 ounces every 3 hours, 5 - 6 times per day.  Gradually increase the amount you drink as tolerated.  You may find it easier to slowly sip shakes throughout the day.  It is important to get your proteins in first.   Protein Shake   Drink at least 2 ounces of shake 5-6 times per day   Each serving of protein shakes should have a minimum of 15 grams of protein and no more than 5 grams of carbohydrate    Increase the amount of protein shake you drink as tolerated   Protein powder may be added to fluids such as non-fat milk or Lactaid milk (limit to 20 grams added protein powder per serving   The initial goal is to drink at least 8 ounces of protein shake/drink per day (or as directed by the nutritionist). Some examples of protein shakes are [employer], [employer], EAS Edge HP, and [employer]jury. Hydration   Gradually increase the amount of water and other liquids as tolerated (See Acceptable Fluids)   Gradually increase the amount of protein shake as tolerated     Sip fluids slowly and throughout the day   May use Sugar substitutes, use sparingly (limit to 6 - 8 packets per day). Your fluid goal is 64 ounces of fluid daily. It may take a few weeks to build up to this.         32 oz (or more) should be clear liquids and 32 oz (or more) should be full liquids.         Liquids should not contain sugar, caffeine, or carbonation! Acceptable Fluids Clear Liquids:   Water or Sugar-free flavored water, Fruit H2O   Decaffeinated coffee or tea (sugar-free)   Crystal Lite, Wyler's Lite, Minute Maid Lite   Sugar-free Jell-O   Bouillon or broth   Sugar-free Popsicle:   *Less than 20 calories each; Limit 1 per day   Full Liquids:               Protein Shakes/Drinks + 2 choices per day of other full liquids shown below.    Other full liquids must be: No more than 12 grams of Carbs per serving,  No more than 3 grams of Fat per serving   Strained low-fat cream soup   Non-Fat milk   Fat-free Lactaid Milk   Sugar-free yogurt (Dannon Lite & Fit) Vitamins and Minerals (Start 1 day after surgery unless otherwise directed)   2 Chewable Multivitamin / Multimineral Supplement (i.e. Centrum for Adults)   Chewable Calcium Citrate with Vitamin D-3. Take 1500 mg each day.           (Example: 3 Chewable Calcium Plus 600 with Vitamin D-3 can be found  at [facility])         Vitamin B-12, 350 - 500 micrograms (oral tablet) each day   Do not mix multivitamins containing iron with calcium supplements; take 2 hours   apart   Do not substitute Tums (calcium carbonate) for your calcium   Menstruating women and those at risk for anemia may need extra iron. Talk with your doctor to see if you need additional iron.    If you need extra iron:  Total daily Iron recommendations (including Vitamins) = 50 - 100 mg Iron/day Do not stop taking or change any vitamins or minerals until you talk to your nutritionist or surgeon. Your nutritionist and / or physician must approve all vitamin and mineral supplements. Exercise For maximum success, begin exercising as soon as your doctor recommends. Make sure your physician approves any physical activity.   Depending on fitness level, begin with a simple walking program   Walk 5-15 minutes each day, 7 days per week.    Slowly increase until you are walking 30-45 minutes per day   Consider joining our BELT program. [phone] or email belt@[employer] .edu Things to remember:    You may have sexual relations when you feel comfortable. It is VERY important for female patients to use a reliable birth control method. Fertility often increases after surgery. Do not get pregnant for at least 18 months.   It is very important to keep all  follow up appointments with your surgeon, nutritionist, primary care physician, and behavioral health practitioner. After the first year, please follow up with your bariatric surgeon at least once a year in order to maintain best weight loss results.  [facility]: [phone] [facility]: [phone]   Free counseling is available for you and your family through collaboration between [facility] and [place]. Please call [phone] and leave a message.    Consider purchasing a medical alert bracelet that says you had gastric bypass surgery.    [facility] has a free Bariatric Surgery Support Group that meets monthly, the 3rd Thursday, 6 pm, Classroom #1, [place]. You may register online at [email or web address], but registration is not necessary. Select Classes and Support Groups, Bariatric Surgery, or Call [phone]   Do not return to work or drive until cleared by your surgeon   Use your CPAP when sleeping if applicable   Do not lift anything greater than ten pounds for at least two weeks  Talmadge Chad, RN Bariatric Nurse Coordinator

## 2011-12-01 NOTE — Progress Notes (Signed)
Call placed to Dr Biagio Quint to notify him of patient experiencing elevated temperature, IV infiltration.  IV nurse came to a second to restart IV on patient's left hand index finger. Patient was in- and- out cath x 2.  Received order to leave foley if patient unable to void.

## 2011-12-02 LAB — CBC WITH DIFFERENTIAL/PLATELET
Basophils Absolute: 0 10*3/uL (ref 0.0–0.1)
Basophils Relative: 0 % (ref 0–1)
Hemoglobin: 10.9 g/dL — ABNORMAL LOW (ref 12.0–15.0)
Lymphocytes Relative: 27 % (ref 12–46)
MCHC: 31.8 g/dL (ref 30.0–36.0)
Monocytes Relative: 9 % (ref 3–12)
Neutro Abs: 4.5 10*3/uL (ref 1.7–7.7)
Neutrophils Relative %: 61 % (ref 43–77)
RDW: 13.2 % (ref 11.5–15.5)
WBC: 7.4 10*3/uL (ref 4.0–10.5)

## 2011-12-02 LAB — GLUCOSE, CAPILLARY
Glucose-Capillary: 114 mg/dL — ABNORMAL HIGH (ref 70–99)
Glucose-Capillary: 111 mg/dL — ABNORMAL HIGH (ref 70–99)

## 2011-12-02 MED ORDER — INFLUENZA VIRUS VACC SPLIT PF IM SUSP
0.5000 mL | INTRAMUSCULAR | Status: AC
  Administered 2011-12-03: 0.5 mL via INTRAMUSCULAR
  Filled 2011-12-02: qty 0.5

## 2011-12-02 NOTE — Progress Notes (Signed)
Patient ID: [name], female   DOB: April 27, 1975, 36 y.o.   MRN: [id number] 2 Days Post-Op  Subjective: Pain better, controlled with meds.  Ambulatory.  Tol H2O  Objective: Vital signs in last 24 hours: Temp:  [97.7 F (36.5 C)-98.6 F (37 C)] 97.7 F (36.5 C) (09/11 0612) Pulse Rate:  [91-97] 91  (09/11 0612) Resp:  [18] 18  (09/11 0612) BP: (100-122)/(62-80) 103/65 mmHg (09/11 0612) SpO2:  [98 %-100 %] 99 % (09/11 0612)    Intake/Output from previous day: 09/10 0701 - 09/11 0700 In: 3005.8 [P.O.:60; I.V.:2945.8] Out: 2850 [Urine:2850] Intake/Output this shift:    General appearance: alert and no distress GI: normal findings: soft, non-tender Incision/Wound: Clean and dry  Lab Results:   [facility] 12/02/11 0444 12/01/11 0425  WBC 7.4 9.0  HGB 10.9* 11.7*  HCT 34.3* 36.5  PLT 170 204   CBG (last 3)   Basename 12/02/11 0357 12/02/11 0037 12/01/11 2020  GLUCAP 114* 121* 101*      BMET No results found for this basename: NA:2,K:2,CL:2,CO2:2,GLUCOSE:2,BUN:2,CREATININE:2,CALCIUM:2 in the last 72 hours   Studies/Results: Dg Ugi W/water Sol Cm  12/01/2011  *RADIOLOGY REPORT*  Clinical Data:  Postop gastric bypass.  UPPER GI SERIES WITH KUB  Technique:  Routine upper GI series was performed with water soluble contrast  Fluoroscopy Time: 2.1 minutes  Comparison:  Plain film 09/09/2011  Findings: Initial KUB demonstrates no bowel obstruction. Cholecystectomy clips right upper quadrant.  Surgical clips in the left upper quadrant cyst with bypass.  The patient ingested a small quantity of water-soluble contrast (20 ml).  There is a small gastric pouch noted.  Contrast did not pass through the gastrojejunostomy initially with retained contrast within the esophagus.  Ultimately contrasted passed through the jejunostomy without evidence of leak or obstruction.  There is residual contrast within the esophagus at the end of the study.  IMPRESSION:  1.  No evidence of high-grade  obstruction or leak at the gastrojejunostomy. 2.  Thin stream of contrast passing through the jejunostomy with some retention of oral contrast in the esophagus.   Original Report Authenticated By: Genevive Bi, M.D.     Anti-infectives: Anti-infectives     Start     Dose/Rate Route Frequency Ordered Stop   11/30/11 0512   moxifloxacin (AVELOX) IVPB 400 mg        400 mg 250 mL/hr over 60 Minutes Intravenous 60 min pre-op 11/30/11 0512 11/30/11 0735          Assessment/Plan: s/p Procedure(s): LAPAROSCOPIC ROUX-EN-Y GASTRIC UPPER GI ENDOSCOPY SMALL BOWEL RESECTION Doing well. Start POD 2 diet.  Anticipate discharge tomorrow   LOS: 2 days    Rayhaan Huster T 12/02/2011

## 2011-12-02 NOTE — Progress Notes (Signed)
Pt alert and oriented; VSS; husband at bedside; pt c/o headache and neck pain with some relief from prn meds; c/o some nausea but denies vomiting; tolerating water well; will advance to protein shakes today; denies flatus or BM; voiding without difficulty; ambulating in hallways without difficulty; c/o some abdominal soreness with relief from prn meds; using incentive spirometer; pt already has follow up appts with [facility] and [facility]; questions answered; anticipate discharge tomorrow; continue current plan of care. Talmadge Chad, RN Bariatric Nurse Coordinator

## 2011-12-03 MED ORDER — INSULIN GLARGINE 100 UNIT/ML ~~LOC~~ SOLN: 20.0000 [IU] | Freq: Every day | SUBCUTANEOUS | Status: DC

## 2011-12-03 MED ORDER — OXYCODONE-ACETAMINOPHEN 5-325 MG/5ML PO SOLN: 5.0000 mL | ORAL | Status: DC | PRN

## 2011-12-03 NOTE — Progress Notes (Signed)
Pt alert and oriented; VSS; tolerating water and protein shakes well; slight nausea; denies vomiting; +flatus; +BM; voiding without difficulty; ambulating without difficulty; still has mild headache and neck ache with relief from prn meds; c/o some abdominal soreness with relief from prn meds; using incentive spirometer; pt already has follow up appts with [facility] and [facility]; aware of support group; Rx given; discharge instructions reviewed and pt and husband verbalized understanding of.  GASTRIC BYPASS/SLEEVE DISCHARGE INSTRUCTIONS  Drs. Fredrik Rigger, Hoxworth, Wilson, and [place] Call if you have any problems.   Call [phone] and ask for the surgeon on call.    If you need immediate assistance come to the ER at [facility]. Tell the ER personnel that you are a new post-op gastric bypass patient. Signs and symptoms to report:   Severe vomiting or nausea. If you cannot tolerate clear liquids for longer than 1 day, you need to call your surgeon.    Abdominal pain which does not get better after taking your pain medication   Fever greater than 101 F degree   Difficulty breathing   Chest pain    Redness, swelling, drainage, or foul odor at incision sites    If your incisions open or pull apart   Swelling or pain in calf (lower leg)   Diarrhea, frequent watery, uncontrolled bowel movements.   Constipation, (no bowel movements for 3 days) if this occurs, Take Milk of Magnesia, 2 tablespoons by mouth, 3 times a day for 2 days if needed.  Call your doctor if constipation continues. Stop taking Milk of Magnesia once you have had a bowel movement. You may also use Miralax according to the label instructions.   Anything you consider "abnormal for you".   Normal side effects after Surgery:   Unable to sleep at night or concentrate   Irritability   Being tearful (crying) or depressed   These are common complaints, possibly related to your anesthesia, stress of surgery and change in lifestyle, that  usually go away a few weeks after surgery.  If these feelings continue, call your medical doctor.  Wound Care You may have surgical glue, steri-strips, or staples over your incisions after surgery.  Surgical glue:  Looks like a clear film over your incisions and will wear off gradually. Steri-strips: Strips of tape over your incisions. You may notice a yellowish color on the skin underneath the steri-strips. This is a substance used to make the steri-strips stick better. Do not pull the steri-strips off - let them fall off.  Staples: Cherlynn Polo may be removed before you leave the hospital. If you go home with staples, call [facility] [phone]) for an appointment with your surgeon's nurse to have staples removed in 7 - 10 days. Showering: You may shower two days after your surgery unless otherwise instructed by your surgeon. Wash gently around wounds with warm soapy water, rinse well, and gently pat dry.  If you have a drain, you may need someone to hold this while you shower. Avoid tub baths until staples are removed and incisions are healed.    Medications   Medications should be liquid or crushed if larger than the size of a dime.  Extended release pills should not be crushed.   Depending on the size and number of medications you take, you may need to stagger/change the time you take your medications so that you do not over-fill your pouch.    Make sure you follow-up with your primary care physician to make medication  adjustments needed during rapid weight loss and life-style adjustment.   If you are diabetic, follow up with the doctor that prescribes your diabetes medication(s) within one week after surgery and check your blood sugar regularly.   Do not drive while taking narcotics!   Do not take acetaminophen (Tylenol) and Roxicet or Lortab Elixir at the same time since these pain medications contain acetaminophen.  Diet at home: (First 2 Weeks) You will see the nutritionist  two weeks after your surgery. She will advance your diet if you are tolerating liquids well. Once at home, if you have severe vomiting or nausea and cannot tolerate clear liquids lasting longer than 1 day, call your surgeon.  Begin high protein shake 2 ounces every 3 hours, 5 - 6 times per day.  Gradually increase the amount you drink as tolerated.  You may find it easier to slowly sip shakes throughout the day.  It is important to get your proteins in first.   Protein Shake   Drink at least 2 ounces of shake 5-6 times per day   Each serving of protein shakes should have a minimum of 15 grams of protein and no more than 5 grams of carbohydrate    Increase the amount of protein shake you drink as tolerated   Protein powder may be added to fluids such as non-fat milk or Lactaid milk (limit to 20 grams added protein powder per serving   The initial goal is to drink at least 8 ounces of protein shake/drink per day (or as directed by the nutritionist). Some examples of protein shakes are [employer], [employer], EAS Edge HP, and [employer]jury. Hydration   Gradually increase the amount of water and other liquids as tolerated (See Acceptable Fluids)   Gradually increase the amount of protein shake as tolerated     Sip fluids slowly and throughout the day   May use Sugar substitutes, use sparingly (limit to 6 - 8 packets per day). Your fluid goal is 64 ounces of fluid daily. It may take a few weeks to build up to this.         32 oz (or more) should be clear liquids and 32 oz (or more) should be full liquids.         Liquids should not contain sugar, caffeine, or carbonation! Acceptable Fluids Clear Liquids:   Water or Sugar-free flavored water, Fruit H2O   Decaffeinated coffee or tea (sugar-free)   Crystal Lite, Wyler's Lite, Minute Maid Lite   Sugar-free Jell-O   Bouillon or broth   Sugar-free Popsicle:   *Less than 20 calories each; Limit 1 per day   Full Liquids:              Protein  Shakes/Drinks + 2 choices per day of other full liquids shown below.    Other full liquids must be: No more than 12 grams of Carbs per serving,  No more than 3 grams of Fat per serving   Strained low-fat cream soup   Non-Fat milk   Fat-free Lactaid Milk   Sugar-free yogurt (Dannon Lite & Fit) Vitamins and Minerals (Start 1 day after surgery unless otherwise directed)   2 Chewable Multivitamin / Multimineral Supplement (i.e. Centrum for Adults)   Chewable Calcium Citrate with Vitamin D-3. Take 1500 mg each day.           (Example: 3 Chewable Calcium Plus 600 with Vitamin D-3 can be found at [facility])  Vitamin B-12, 350 - 500 micrograms (oral tablet) each day   Do not mix multivitamins containing iron with calcium supplements; take 2 hours   apart   Do not substitute Tums (calcium carbonate) for your calcium   Menstruating women and those at risk for anemia may need extra iron. Talk with your doctor to see if you need additional iron.    If you need extra iron:  Total daily Iron recommendations (including Vitamins) = 50 - 100 mg Iron/day Do not stop taking or change any vitamins or minerals until you talk to your nutritionist or surgeon. Your nutritionist and / or physician must approve all vitamin and mineral supplements. Exercise For maximum success, begin exercising as soon as your doctor recommends. Make sure your physician approves any physical activity.   Depending on fitness level, begin with a simple walking program   Walk 5-15 minutes each day, 7 days per week.    Slowly increase until you are walking 30-45 minutes per day   Consider joining our BELT program. [phone] or email belt@[employer] .edu Things to remember:    You may have sexual relations when you feel comfortable. It is VERY important for female patients to use a reliable birth control method. Fertility often increases after surgery. Do not get pregnant for at least 18 months.   It is very important to keep all follow  up appointments with your surgeon, nutritionist, primary care physician, and behavioral health practitioner. After the first year, please follow up with your bariatric surgeon at least once a year in order to maintain best weight loss results.  [facility]: [phone] [facility]: [phone]   Free counseling is available for you and your family through collaboration between [facility] and [place]. Please call [phone] and leave a message.    Consider purchasing a medical alert bracelet that says you had gastric bypass surgery.    [facility] has a free Bariatric Surgery Support Group that meets monthly, the 3rd Thursday, 6 pm, Classroom #1, [place]. You may register online at [email or web address], but registration is not necessary. Select Classes and Support Groups, Bariatric Surgery, or Call [phone]   Do not return to work or drive until cleared by your surgeon   Use your CPAP when sleeping if applicable   Do not lift anything greater than ten pounds for at least two weeks  Talmadge Chad, RN Bariatric Nurse coordinator

## 2011-12-03 NOTE — Progress Notes (Signed)
Discharge instructions given and reapeated with clear understanding.  Pt verbalizes understanding. Copy of discharge instructions given per md order and follow up appointment in place.  Husband to transport pt home

## 2011-12-03 NOTE — Progress Notes (Signed)
Patient ID: [name], female   DOB: Sep 30, 1975, 36 y.o.   MRN: [id number] 3 Days Post-Op  Subjective: Just sore, no other C/O.  Tol proteinshakes, + BM  Objective: Vital signs in last 24 hours: Temp:  [98.1 F (36.7 C)-98.7 F (37.1 C)] 98.4 F (36.9 C) (09/12 0600) Pulse Rate:  [77-109] 99  (09/12 0600) Resp:  [18-20] 18  (09/12 0600) BP: (110-132)/(62-74) 110/69 mmHg (09/12 0600) SpO2:  [92 %-95 %] 95 % (09/12 0600) Last BM Date: 12/02/11  Intake/Output from previous day: 09/11 0701 - 09/12 0700 In: 180 [P.O.:180] Out: 2100 [Urine:2100] Intake/Output this shift:    General appearance: alert and no distress GI: normal findings: soft, non-tender Incision/Wound: Clean without infection  Lab Results:   Basename 12/02/11 0444 12/01/11 0425  WBC 7.4 9.0  HGB 10.9* 11.7*  HCT 34.3* 36.5  PLT 170 204   CBG (last 3)   Basename 12/03/11 0732 12/03/11 0410 12/02/11 2347  GLUCAP 114* 113* 113*    BMET No results found for this basename: NA:2,K:2,CL:2,CO2:2,GLUCOSE:2,BUN:2,CREATININE:2,CALCIUM:2 in the last 72 hours   Studies/Results: Dg Ugi W/water Sol Cm  12/01/2011  *RADIOLOGY REPORT*  Clinical Data:  Postop gastric bypass.  UPPER GI SERIES WITH KUB  Technique:  Routine upper GI series was performed with water soluble contrast  Fluoroscopy Time: 2.1 minutes  Comparison:  Plain film 09/09/2011  Findings: Initial KUB demonstrates no bowel obstruction. Cholecystectomy clips right upper quadrant.  Surgical clips in the left upper quadrant cyst with bypass.  The patient ingested a small quantity of water-soluble contrast (20 ml).  There is a small gastric pouch noted.  Contrast did not pass through the gastrojejunostomy initially with retained contrast within the esophagus.  Ultimately contrasted passed through the jejunostomy without evidence of leak or obstruction.  There is residual contrast within the esophagus at the end of the study.  IMPRESSION:  1.  No evidence of  high-grade obstruction or leak at the gastrojejunostomy. 2.  Thin stream of contrast passing through the jejunostomy with some retention of oral contrast in the esophagus.   Original Report Authenticated By: Genevive Bi, M.D.     Anti-infectives: Anti-infectives     Start     Dose/Rate Route Frequency Ordered Stop   11/30/11 0512   moxifloxacin (AVELOX) IVPB 400 mg        400 mg 250 mL/hr over 60 Minutes Intravenous 60 min pre-op 11/30/11 0512 11/30/11 0735          Assessment/Plan: s/p Procedure(s): LAPAROSCOPIC ROUX-EN-Y GASTRIC UPPER GI ENDOSCOPY SMALL BOWEL RESECTION Doing very well.  OK for discharge   LOS: 3 days    Aino Heckert T 12/03/2011

## 2011-12-03 NOTE — Progress Notes (Signed)
Per MD order, PICC line removed. Cath intact at 42cm. Vaseline pressure gauze to site, pressure held x 5min. No bleeding to site. Pt instructed to keep dressing CDI x 24 hours. Avoid heavy lifting, pushing or pulling x 24 hours,  If bleeding occurs hold pressure, if bleeding does not stop contact MD or go to the ED. Pt does not have any questions. [name] M 

## 2011-12-03 NOTE — Discharge Summary (Signed)
Patient ID: [name] [id number] 36 y.o. 1975-08-27  11/30/2011  Discharge date and time: 12/03/2011   Admitting Physician: Glenna Fellows T  Discharge Physician: Glenna Fellows T  Admission Diagnoses: MORBID OBESITY  Discharge Diagnoses: Same  Operations: Procedure(s): LAPAROSCOPIC ROUX-EN-Y GASTRIC UPPER GI ENDOSCOPY SMALL BOWEL RESECTION  Admission Condition: good  Discharged Condition: good  Indication for Admission: For elective RYGB  Hospital Course: Uneventful procedure and hospital course with out complication. At discharge pain well controlled, tol protien shake, abd exam benign   Disposition: Home  Patient Instructions:   [name], [place]  Home Medication Instructions NWG:[id number]   Printed on:12/03/11 0856  Medication Information                    amphetamine-dextroamphetamine (ADDERALL) 30 MG tablet Take 30 mg by mouth 2 (two) times daily. 1 tablet at 8 am and 1 tablet at 12 noon           insulin aspart (NOVOLOG) 100 UNIT/ML injection Inject 8-12 Units into the skin 3 (three) times daily before meals. 10units breakfast 8units lunch 12 units dinnertime.           fenofibrate micronized (LOFIBRA) 200 MG capsule Take 200 mg by mouth at bedtime.            atorvastatin (LIPITOR) 20 MG tablet Take 20 mg by mouth every evening.            verapamil (CALAN) 40 MG tablet Take 120 mg by mouth 2 (two) times daily.            perphenazine (TRILAFON) 4 MG tablet Take 4 mg by mouth 3 (three) times daily.           cyclobenzaprine (FLEXERIL) 10 MG tablet Take 10 mg by mouth 3 (three) times daily as needed. For headaches           PROAIR HFA 108 (90 BASE) MCG/ACT inhaler 2 puffs every 6 (six) hours as needed. S.O.B           clobetasol (TEMOVATE) 0.05 % external solution 1 application 2 (two) times daily.            ketoconazole (NIZORAL) 2 % shampoo Apply 1 application topically daily.            NASONEX 50 MCG/ACT nasal spray Place 1  spray into the nose daily.            promethazine (PHENERGAN) 6.25 MG/5ML syrup Take 25 mg by mouth 4 (four) times daily as needed. Pt has both injection and liquid at home           Metformin HCl (RIOMET) 500 MG/5ML SOLN Take 5-10 mLs by mouth 2 (two) times daily. Takes 5 ml in the morning and 10 mls in the evening           ALPRAZolam (XANAX) 1 MG tablet Take 1 mg by mouth 4 (four) times daily.           loratadine (CLARITIN) 10 MG tablet Take 10 mg by mouth daily.           nortriptyline (PAMELOR) 10 MG/5ML solution Take 150 mg by mouth at bedtime.            topiramate (TOPAMAX) 25 MG capsule Take 100 mg by mouth as directed. Open capsules and mix in food/drink and consume all at once Patient takes am and pm           Promethazine HCl (PHENERGAN IJ)  Inject 25 mg as directed 4 (four) times daily as needed. Nausea           Multiple Vitamin (MULTIVITAMIN WITH MINERALS) TABS Take 1 tablet by mouth daily.           ONETOUCH DELICA LANCETS MISC            mupirocin ointment (BACTROBAN) 2 %            predniSONE (DELTASONE) 20 MG tablet            insulin glargine (LANTUS) 100 UNIT/ML injection Inject 20 Units into the skin daily after supper.           oxyCODONE-acetaminophen (ROXICET) 5-325 MG/5ML solution Take 5-10 mLs by mouth every 4 (four) hours as needed.             Activity: activity as tolerated Diet: Protein shakes Wound Care: none needed  Follow-up:  With Dr Johna Sheriff in 2 weeks.  Signed: Mariella Saa MD, FACS  12/03/2011, 8:56 AM

## 2011-12-05 ENCOUNTER — Encounter: Payer: Self-pay | Admitting: *Deleted

## 2011-12-05 NOTE — Progress Notes (Signed)
Pt requested I visit her in the hospital after surgery. I stopped by pt's room on 9/12 at approximately 4:30pm. Spent 45-50 min visiting with pt and husband. Pt had multiple questions regarding schedule of supplements, protein shakes and advancing amounts, and medications post-op. Advised pt to ask medication questions to Dr. Johna Sheriff. Discussed sample schedule for vits/calcium and different types of protein shakes as well as other full liquids (i.e. Yogurt, etc) she could try when home. Pt verbalized understanding to contact myself or Dr. Johna Sheriff with any problems.

## 2011-12-11 ENCOUNTER — Encounter (INDEPENDENT_AMBULATORY_CARE_PROVIDER_SITE_OTHER): Payer: Self-pay | Admitting: General Surgery

## 2011-12-11 ENCOUNTER — Ambulatory Visit (INDEPENDENT_AMBULATORY_CARE_PROVIDER_SITE_OTHER): Payer: BC Managed Care – PPO | Admitting: General Surgery

## 2011-12-11 NOTE — Progress Notes (Signed)
History: Patient returns for her first office visit 3 weeks following laparoscopic Roux-en-Y gastric bypass. She has been getting along very well without any major complaints. She did have some rash around her incisions and was probably allergic to the Dermabond. This is nearly resolved. She is tolerating her protein shakes without difficulty and her energy level is very good. No palpable pain or nausea or fever.  Exam: BP 138/76  Pulse 84  Temp 97.7 F (36.5 C) (Temporal)  Resp 16  Ht 5\' 7"  (1.702 m)  Wt 265 lb 3.2 oz (120.294 kg)  BMI 41.54 kg/m2  LMP 11/23/2011 General: Appears well Abdomen: Soft and nontender. Wounds are healing well.  Assessment and plan: Doing very well following her gastric bypass without complication identified. She is already down to just 20 units of Lantus at night. She is to return in 3 weeks.

## 2011-12-15 ENCOUNTER — Telehealth (INDEPENDENT_AMBULATORY_CARE_PROVIDER_SITE_OTHER): Payer: Self-pay | Admitting: General Surgery

## 2011-12-15 ENCOUNTER — Encounter: Payer: Self-pay | Admitting: *Deleted

## 2011-12-15 ENCOUNTER — Encounter: Payer: BC Managed Care – PPO | Attending: General Surgery | Admitting: *Deleted

## 2011-12-15 DIAGNOSIS — Z01818 Encounter for other preprocedural examination: Secondary | ICD-10-CM | POA: Insufficient documentation

## 2011-12-15 DIAGNOSIS — Z713 Dietary counseling and surveillance: Secondary | ICD-10-CM | POA: Insufficient documentation

## 2011-12-15 NOTE — Patient Instructions (Signed)
Patient to follow Phase 3A-Soft, High Protein Diet and follow-up at [facility] in 6 weeks for 2 months post-op nutrition visit for diet advancement. 

## 2011-12-15 NOTE — Telephone Encounter (Signed)
Pt called to ask if she needs an antibiotic for dental cleaning.  Informed she does not need one.

## 2011-12-15 NOTE — Progress Notes (Signed)
Bariatric Class:  Appt start time: 1600 end time:  1700.  2 Week Post-Operative Nutrition Class  Patient was seen on 12/15/2011 for Post[facility]ative [facility]   Surgery date: 11/30/11 Surgery type: RYGB Start weight @ [facility]: 290.5 lbs  Weight today: 269.5 lbs Weight change: 16.4 lbs Total weight lost: 16.4 lbs BMI: 42.2 kg/m^2  TANITA  BODY COMP RESULTS  09/04/11 12/15/11   %Fat 54.0% 50.0%   Fat Mass (lbs) 157.0 135.0   Fat Free Mass (lbs) 133.5 134.5   Total Body Water (lbs) 97.5 98.5   The following the learning objectives were met by the patient during this course:   Identifies Phase 3A (Soft, High Proteins) Dietary Goals and will begin from 2 weeks post-operatively to 2 months post-operatively  Identifies appropriate sources of fluids and proteins   States protein recommendations and appropriate sources post-operatively  Identifies the need for appropriate texture modifications, mastication, and bite sizes when consuming solids  Identifies appropriate multivitamin and calcium sources post-operatively  Describes the need for physical activity post-operatively and will follow MD recommendations  States when to call healthcare provider regarding medication questions or post-operative complications  Handouts given during class include:  Phase 3A: Soft, High Protein Diet Handoutt  Follow-Up Plan: Patient will follow-up at [facility] in 6 weeks for 2 months post-op nutrition visit for diet advancement per MD.

## 2011-12-22 ENCOUNTER — Telehealth (INDEPENDENT_AMBULATORY_CARE_PROVIDER_SITE_OTHER): Payer: Self-pay | Admitting: General Surgery

## 2011-12-22 ENCOUNTER — Encounter (HOSPITAL_COMMUNITY): Payer: Self-pay | Admitting: Emergency Medicine

## 2011-12-22 ENCOUNTER — Emergency Department (HOSPITAL_COMMUNITY)
Admission: EM | Admit: 2011-12-22 | Discharge: 2011-12-22 | Disposition: A | Discharge status: Home / Self Care | Payer: BC Managed Care – PPO | Attending: Emergency Medicine | Admitting: Emergency Medicine

## 2011-12-22 DIAGNOSIS — Z885 Allergy status to narcotic agent status: Secondary | ICD-10-CM | POA: Insufficient documentation

## 2011-12-22 DIAGNOSIS — Z882 Allergy status to sulfonamides status: Secondary | ICD-10-CM | POA: Insufficient documentation

## 2011-12-22 DIAGNOSIS — Z794 Long term (current) use of insulin: Secondary | ICD-10-CM | POA: Insufficient documentation

## 2011-12-22 DIAGNOSIS — Z79899 Other long term (current) drug therapy: Secondary | ICD-10-CM | POA: Insufficient documentation

## 2011-12-22 DIAGNOSIS — IMO0001 Reserved for inherently not codable concepts without codable children: Secondary | ICD-10-CM | POA: Insufficient documentation

## 2011-12-22 DIAGNOSIS — E1142 Type 2 diabetes mellitus with diabetic polyneuropathy: Secondary | ICD-10-CM | POA: Insufficient documentation

## 2011-12-22 DIAGNOSIS — Z881 Allergy status to other antibiotic agents status: Secondary | ICD-10-CM | POA: Insufficient documentation

## 2011-12-22 DIAGNOSIS — Z888 Allergy status to other drugs, medicaments and biological substances status: Secondary | ICD-10-CM | POA: Insufficient documentation

## 2011-12-22 DIAGNOSIS — K219 Gastro-esophageal reflux disease without esophagitis: Secondary | ICD-10-CM | POA: Insufficient documentation

## 2011-12-22 DIAGNOSIS — Z91013 Allergy to seafood: Secondary | ICD-10-CM | POA: Insufficient documentation

## 2011-12-22 DIAGNOSIS — Z880 Allergy status to penicillin: Secondary | ICD-10-CM | POA: Insufficient documentation

## 2011-12-22 DIAGNOSIS — E785 Hyperlipidemia, unspecified: Secondary | ICD-10-CM | POA: Insufficient documentation

## 2011-12-22 DIAGNOSIS — E1149 Type 2 diabetes mellitus with other diabetic neurological complication: Secondary | ICD-10-CM | POA: Insufficient documentation

## 2011-12-22 DIAGNOSIS — M791 Myalgia, unspecified site: Secondary | ICD-10-CM

## 2011-12-22 DIAGNOSIS — Z91018 Allergy to other foods: Secondary | ICD-10-CM | POA: Insufficient documentation

## 2011-12-22 DIAGNOSIS — R350 Frequency of micturition: Secondary | ICD-10-CM | POA: Insufficient documentation

## 2011-12-22 LAB — CBC WITH DIFFERENTIAL/PLATELET
Basophils Absolute: 0 10*3/uL (ref 0.0–0.1)
HCT: 44.8 % (ref 36.0–46.0)
Lymphocytes Relative: 38 % (ref 12–46)
Lymphs Abs: 3.1 10*3/uL (ref 0.7–4.0)
Monocytes Absolute: 0.5 10*3/uL (ref 0.1–1.0)
Neutro Abs: 4.4 10*3/uL (ref 1.7–7.7)
Platelets: 224 10*3/uL (ref 150–400)
RBC: 5.53 MIL/uL — ABNORMAL HIGH (ref 3.87–5.11)
RDW: 13.9 % (ref 11.5–15.5)
WBC: 8.3 10*3/uL (ref 4.0–10.5)

## 2011-12-22 LAB — BASIC METABOLIC PANEL
Chloride: 99 mEq/L (ref 96–112)
Creatinine, Ser: 0.63 mg/dL (ref 0.50–1.10)
GFR calc Af Amer: >90 mL/min (ref >90)
Sodium: 137 mEq/L (ref 135–145)

## 2011-12-22 LAB — URINALYSIS, ROUTINE W REFLEX MICROSCOPIC
Leukocytes, UA: NEGATIVE
Nitrite: NEGATIVE
Specific Gravity, Urine: 1.018 (ref 1.005–1.030)
pH: 8 (ref 5.0–8.0)

## 2011-12-22 LAB — PREGNANCY, URINE: Preg Test, Ur: NEGATIVE

## 2011-12-22 MED ORDER — HYDROMORPHONE HCL 4 MG PO TABS: 4.0000 mg | ORAL_TABLET | ORAL | Status: DC | PRN

## 2011-12-22 MED ORDER — HYDROMORPHONE HCL PF 1 MG/ML IJ SOLN
1.0000 mg | Freq: Once | INTRAMUSCULAR | Status: AC
  Administered 2011-12-22: 1 mg via INTRAVENOUS
  Filled 2011-12-22: qty 1

## 2011-12-22 MED ORDER — PROMETHAZINE HCL 25 MG/ML IJ SOLN
25.0000 mg | Freq: Once | INTRAMUSCULAR | Status: AC
  Administered 2011-12-22: 25 mg via INTRAVENOUS
  Filled 2011-12-22: qty 1

## 2011-12-22 MED ORDER — SODIUM CHLORIDE 0.9 % IV SOLN
INTRAVENOUS | Status: DC
  Administered 2011-12-22: 14:00:00 via INTRAVENOUS

## 2011-12-22 MED ORDER — ONDANSETRON HCL 4 MG/2ML IJ SOLN: 4.0000 mg | Freq: Once | INTRAMUSCULAR | Status: DC

## 2011-12-22 NOTE — ED Notes (Signed)
Pt states that she is 3 weeks post op gastric bypass surgery.  States that for the past 2 days she feels like she has the flu--generalized body aches and joint pain. C/o NVD and frequency of urination.  States she has not been able to keep anything down for the past 24 hours.

## 2011-12-22 NOTE — ED Provider Notes (Addendum)
History     CSN: [id number]  Arrival date & time 12/22/11  1105   First MD Initiated Contact with Patient 12/22/11 1141      Chief Complaint  Patient presents with  . Generalized Body Aches  . Joint Pain  . Urinary Frequency    (Consider location/radiation/quality/duration/timing/severity/associated sxs/prior treatment) Patient is a 36 y.o. female presenting with frequency. The history is provided by the patient.  Urinary Frequency Pertinent negatives include no chest pain, no abdominal pain, no headaches and no shortness of breath.   36 year old, morbidly obese, female, who had a gastric bypass 3 weeks ago, presents to emergency department complaining of myalgias, nausea, vomiting, sore throat, and urinary frequency since yesterday.  She denies fevers, chills, cough, shortness of breath.  She denies dysuria, or or hematuria.  She does, state she's had a little bit of diarrhea.  Past Medical History  Diagnosis Date  . Depression   . Anxiety   . Diabetes mellitus   . GERD (gastroesophageal reflux disease)   . ADD (attention deficit disorder)   . Hyperlipemia   . Herniated disc   . Allergy     Seasonal  . Diabetic neuropathy   . Shortness of breath     with exertion   . Sleep apnea     obsructive settings at 13   . Heart murmur     asa child  . Seizures     hx of / month ago - dehydration   . 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 Cholecystectomy   . Cesarean section   . Tonsillectomy   . Laporoscopy 2007  . Breath tek h pylori 09/11/2011    Procedure: BREATH TEK H PYLORI;  Surgeon: Mariella Saa, MD;  Location: [facility];  Service: General;  Laterality: N/A;  . 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 Bowel resection 11/30/2011   Procedure: SMALL BOWEL RESECTION;  Surgeon: Mariella Saa, MD;  Location: [facility];  Service: General;;    Family History  Problem Relation Age of Onset  . Diabetes type II    . Diabetes Mother   . Hypertension Mother   . Depression Brother   . Cancer Maternal Aunt     ovarian   . Depression Maternal Aunt   . Diabetes Maternal Aunt   . Cancer Maternal Grandmother     Lung  . COPD Maternal Grandmother   . Cancer Maternal Grandfather     Prostate  . Diabetes Maternal Grandfather   . Hearing loss Maternal Grandfather   . Hypertension Maternal Grandfather   . Stroke Maternal Grandfather   . Cancer Paternal Grandmother     Colon    History  Substance Use Topics  . Smoking status: Never Smoker   . Smokeless tobacco: Never Used  . Alcohol Use: No    OB History    Grav Para Term Preterm Abortions TAB SAB Ect Mult Living                  Review of Systems  Constitutional: Negative for fever, chills and diaphoresis.  HENT: Negative for neck pain.   Respiratory: Negative for shortness of breath.   Cardiovascular: Negative for chest pain.  Gastrointestinal: Positive for nausea, vomiting  and diarrhea. Negative for abdominal pain.  Genitourinary: Positive for frequency. Negative for dysuria, hematuria and flank pain.  Musculoskeletal: Positive for myalgias. Negative for back pain.  Neurological: Negative for headaches.  Hematological: Does not bruise/bleed easily.  Psychiatric/Behavioral: Negative for confusion.  All other systems reviewed and are negative.    Allergies  Atarax; Cephalosporins; Codeine; Compazine; Decadron; Demerol; Metoclopramide hcl; Morphine and related; Penicillins; Sulfa antibiotics; Triptans; Zofran; Pork-derived products; and Shellfish-derived products  Home Medications   Current Outpatient Rx  Name Route Sig Dispense Refill  . ALBUTEROL SULFATE HFA 108 (90 BASE) MCG/ACT IN AERS Inhalation Inhale 2 puffs into the lungs every 6 (six) hours as  needed. For shortness of breath.    . ALPRAZOLAM 1 MG PO TABS Oral Take 1 mg by mouth 4 (four) times daily.    . CYCLOBENZAPRINE HCL 10 MG PO TABS Oral Take 10 mg by mouth 3 (three) times daily as needed. For headaches    . INSULIN ASPART 100 UNIT/ML [place] SOLN Subcutaneous Inject 8-12 Units into the skin 3 (three) times daily before meals. 10units breakfast 8units lunch 12 units dinnertime.    . INSULIN GLARGINE 100 UNIT/ML [place] SOLN Subcutaneous Inject 20 Units into the skin daily after supper. 10 mL 0  . KETOCONAZOLE 2 % EX SHAM Topical Apply 1 application topically daily as needed. For scalp itching/pain.    Marland Kitchen LORATADINE 10 MG PO TABS Oral Take 10 mg by mouth daily as needed. For allergies.    . METHYLPHENIDATE HCL ER 25 MG/5ML PO SUSR Oral Take 60 mg by mouth daily.    . ADULT GUMMY PO CHEW Oral Chew 2 tablets by mouth daily.    Marland Kitchen PERPHENAZINE 4 MG PO TABS Oral Take 4 mg by mouth 3 (three) times daily.    Marland Kitchen PROMETHAZINE HCL 6.25 MG/5ML PO SYRP Oral Take 25 mg by mouth 4 (four) times daily as needed. For nausea.    Marland Kitchen PHENERGAN IJ Injection Inject 25 mg as directed 4 (four) times daily as needed. Nausea    . TOPIRAMATE 25 MG PO CPSP Oral Take 100 mg by mouth 2 (two) times daily. Open capsules and mix in food/drink and consume all at once    . VERAPAMIL HCL 40 MG PO TABS Oral Take 120 mg by mouth 2 (two) times daily.     Letta Pate DELICA LANCETS MISC        BP 127/82  Pulse 97  Temp 97.8 F (36.6 C) (Oral)  Resp 18  SpO2 98%  LMP 11/23/2011  Physical Exam  Nursing note and vitals reviewed. Constitutional: She is oriented to person, place, and time. No distress.       Morbidly obese  HENT:  Head: Normocephalic and atraumatic.  Eyes: Conjunctivae normal and EOM are normal.  Neck: Normal range of motion. Neck supple. No tracheal deviation present.       Left occipital lymphadenopathy without tenderness  Cardiovascular: Normal rate, regular rhythm and intact distal pulses.   No murmur  heard. Pulmonary/Chest: Effort normal and breath sounds normal. No respiratory distress. She has no rales.  Abdominal: Bowel sounds are normal. She exhibits no distension. There is no tenderness.  Musculoskeletal: Normal range of motion. She exhibits no edema.  Lymphadenopathy:    She has cervical adenopathy.  Neurological: She is alert and oriented to person, place, and time.  Skin: Skin is warm and dry. No erythema.  Psychiatric: She has a normal mood and affect. Thought content normal.  ED Course  Procedures (including critical care time) 36 year old, female, with generalized myalgias, nausea, vomiting, and frequency of urination.  Her physical examination is really unremarkable.  We will establish an IV and give her analgesics, and antiemetics.  For her symptoms, and perform generalized lab testing, including blood and urine.   Labs Reviewed  BASIC METABOLIC PANEL  CBC WITH DIFFERENTIAL  URINALYSIS, ROUTINE W REFLEX MICROSCOPIC  PREGNANCY, URINE   No results found.   No diagnosis found.  3:54 PM Feels better.  Says she is ready to go home.  MDM  Myalgias        Cheri Guppy, MD 12/22/11 1206  Cheri Guppy, MD 12/22/11 [phone]

## 2011-12-22 NOTE — Telephone Encounter (Signed)
Pt called because she is vomiting now; has had chills and body aches for 2 days.  She denies fever.  Advised her to call her PCP for appt.  She understands and will comply.

## 2011-12-28 DIAGNOSIS — J309 Allergic rhinitis, unspecified: Secondary | ICD-10-CM | POA: Diagnosis not present

## 2011-12-28 DIAGNOSIS — H1045 Other chronic allergic conjunctivitis: Secondary | ICD-10-CM | POA: Diagnosis not present

## 2011-12-28 DIAGNOSIS — R0602 Shortness of breath: Secondary | ICD-10-CM | POA: Diagnosis not present

## 2011-12-30 ENCOUNTER — Encounter: Payer: Self-pay | Admitting: *Deleted

## 2011-12-30 DIAGNOSIS — G43709 Chronic migraine without aura, not intractable, without status migrainosus: Secondary | ICD-10-CM | POA: Diagnosis not present

## 2011-12-30 NOTE — Progress Notes (Signed)
Tynika returned on 12/29/11 for repeat Tanita scan. Results below.  TANITA BODY COMP RESULTS   09/04/11  12/15/11  12/29/11  %Fat  54.0%  50.0%  52.2%  Fat Mass (lbs)  157.0  135.0  133.5  Fat Free Mass (lbs)  133.5  134.5  122.5  Total Body Water (lbs)  97.5  98.5  89.5   CHANGES FROM PREVIOUS VISIT: FM: - 1.5 lbs FFM:  - 12.0 lbs TBW:  - 9.0 lbs

## 2012-01-07 DIAGNOSIS — E785 Hyperlipidemia, unspecified: Secondary | ICD-10-CM | POA: Diagnosis not present

## 2012-01-07 DIAGNOSIS — E119 Type 2 diabetes mellitus without complications: Secondary | ICD-10-CM | POA: Diagnosis not present

## 2012-01-11 ENCOUNTER — Telehealth (INDEPENDENT_AMBULATORY_CARE_PROVIDER_SITE_OTHER): Payer: Self-pay

## 2012-01-11 NOTE — Telephone Encounter (Signed)
The patient had gastric bypass on 9/9.  She is scheduled tomorrow to have the Mirena placed.  They instructed her to take 800 mg Ibuprofen prior to the procedure to help with the pain.  She asked if this is ok.  I told her just the one time is ok but she should not take it long term.  She said it is just right before the procedure.

## 2012-01-13 ENCOUNTER — Encounter (INDEPENDENT_AMBULATORY_CARE_PROVIDER_SITE_OTHER): Payer: Self-pay | Admitting: General Surgery

## 2012-01-13 ENCOUNTER — Ambulatory Visit (INDEPENDENT_AMBULATORY_CARE_PROVIDER_SITE_OTHER): Payer: BC Managed Care – PPO | Admitting: General Surgery

## 2012-01-13 NOTE — Progress Notes (Signed)
History: patient returns now 6 weeks following laparoscopic Roux-en-Y gastric bypass for morbid obesity and comorbidities of insulin-dependent diabetes mellitus, obstructive sleep apnea hyperlipidemia. She had one episode of vomiting about 2 weeks ago but this was isolated and she's otherwise tolerating a small volume solid food without pain or nausea. No other complaints. She has been exercising. She is very pleased with how she is doing. She is now only on minimal insulin.  Exam: BP 128/68  Pulse 100  Temp 97.1 F (36.2 C) (Temporal)  Resp 16  Ht 5\' 7"  (1.702 m)  Wt 248 lb (112.492 kg)  BMI 38.84 kg/m2 Total weight loss 39 pounds, 17 pounds since last visit General: Appears well Abdomen: Soft and nontender. Lungs: Clear equal breath sounds  Assessment and plan: Doing well following her gastric bypass without complication identified and good early improvement in her comorbidities. We discussed diet exercise stretches and she is to return in 6 weeks.

## 2012-01-19 ENCOUNTER — Emergency Department (HOSPITAL_COMMUNITY): Payer: BC Managed Care – PPO

## 2012-01-19 ENCOUNTER — Encounter (HOSPITAL_COMMUNITY): Payer: Self-pay | Admitting: Emergency Medicine

## 2012-01-19 ENCOUNTER — Observation Stay (HOSPITAL_COMMUNITY)
Admission: EM | Admit: 2012-01-19 | Discharge: 2012-01-20 | Disposition: A | Discharge status: Home / Self Care | Payer: BC Managed Care – PPO | Attending: Internal Medicine | Admitting: Internal Medicine

## 2012-01-19 DIAGNOSIS — G8194 Hemiplegia, unspecified affecting left nondominant side: Secondary | ICD-10-CM

## 2012-01-19 DIAGNOSIS — G8190 Hemiplegia, unspecified affecting unspecified side: Principal | ICD-10-CM | POA: Insufficient documentation

## 2012-01-19 DIAGNOSIS — G4733 Obstructive sleep apnea (adult) (pediatric): Secondary | ICD-10-CM | POA: Diagnosis not present

## 2012-01-19 DIAGNOSIS — Z9989 Dependence on other enabling machines and devices: Secondary | ICD-10-CM

## 2012-01-19 DIAGNOSIS — F411 Generalized anxiety disorder: Secondary | ICD-10-CM | POA: Insufficient documentation

## 2012-01-19 DIAGNOSIS — E1149 Type 2 diabetes mellitus with other diabetic neurological complication: Secondary | ICD-10-CM | POA: Insufficient documentation

## 2012-01-19 DIAGNOSIS — R197 Diarrhea, unspecified: Secondary | ICD-10-CM

## 2012-01-19 DIAGNOSIS — F449 Dissociative and conversion disorder, unspecified: Secondary | ICD-10-CM

## 2012-01-19 DIAGNOSIS — E119 Type 2 diabetes mellitus without complications: Secondary | ICD-10-CM | POA: Diagnosis not present

## 2012-01-19 DIAGNOSIS — G43409 Hemiplegic migraine, not intractable, without status migrainosus: Secondary | ICD-10-CM | POA: Diagnosis not present

## 2012-01-19 DIAGNOSIS — E1142 Type 2 diabetes mellitus with diabetic polyneuropathy: Secondary | ICD-10-CM | POA: Insufficient documentation

## 2012-01-19 DIAGNOSIS — F3289 Other specified depressive episodes: Secondary | ICD-10-CM | POA: Insufficient documentation

## 2012-01-19 DIAGNOSIS — E785 Hyperlipidemia, unspecified: Secondary | ICD-10-CM | POA: Insufficient documentation

## 2012-01-19 DIAGNOSIS — Z8669 Personal history of other diseases of the nervous system and sense organs: Secondary | ICD-10-CM

## 2012-01-19 DIAGNOSIS — Z9884 Bariatric surgery status: Secondary | ICD-10-CM | POA: Insufficient documentation

## 2012-01-19 DIAGNOSIS — R112 Nausea with vomiting, unspecified: Secondary | ICD-10-CM | POA: Insufficient documentation

## 2012-01-19 DIAGNOSIS — E1165 Type 2 diabetes mellitus with hyperglycemia: Secondary | ICD-10-CM

## 2012-01-19 DIAGNOSIS — F329 Major depressive disorder, single episode, unspecified: Secondary | ICD-10-CM | POA: Insufficient documentation

## 2012-01-19 DIAGNOSIS — K219 Gastro-esophageal reflux disease without esophagitis: Secondary | ICD-10-CM | POA: Insufficient documentation

## 2012-01-19 LAB — COMPREHENSIVE METABOLIC PANEL
ALT: 31 U/L (ref 0–35)
Alkaline Phosphatase: 68 U/L (ref 39–117)
CO2: 24 mEq/L (ref 19–32)
Chloride: 102 mEq/L (ref 96–112)
GFR calc Af Amer: >90 mL/min (ref >90)
Glucose, Bld: 114 mg/dL — ABNORMAL HIGH (ref 70–99)
Potassium: 4.5 mEq/L (ref 3.5–5.1)
Sodium: 136 mEq/L (ref 135–145)
Total Protein: 7.1 g/dL (ref 6.0–8.3)

## 2012-01-19 LAB — CBC WITH DIFFERENTIAL/PLATELET
Eosinophils Absolute: 0.1 10*3/uL (ref 0.0–0.7)
Lymphocytes Relative: 25 % (ref 12–46)
Lymphs Abs: 2.4 10*3/uL (ref 0.7–4.0)
Neutro Abs: 6.4 10*3/uL (ref 1.7–7.7)
Neutrophils Relative %: 67 % (ref 43–77)
Platelets: 244 10*3/uL (ref 150–400)
RBC: 5.02 MIL/uL (ref 3.87–5.11)
WBC: 9.5 10*3/uL (ref 4.0–10.5)

## 2012-01-19 MED ORDER — HYDROMORPHONE HCL PF 1 MG/ML IJ SOLN
1.0000 mg | Freq: Once | INTRAMUSCULAR | Status: AC
  Administered 2012-01-19: 1 mg via INTRAVENOUS
  Filled 2012-01-19: qty 1

## 2012-01-19 MED ORDER — PROMETHAZINE HCL 6.25 MG/5ML PO SYRP
25.0000 mg | ORAL_SOLUTION | Freq: Four times a day (QID) | ORAL | Status: DC | PRN
  Filled 2012-01-19: qty 20

## 2012-01-19 MED ORDER — ALPRAZOLAM 0.5 MG PO TABS
1.0000 mg | ORAL_TABLET | Freq: Three times a day (TID) | ORAL | Status: DC | PRN
  Administered 2012-01-20: 1 mg via ORAL
  Filled 2012-01-19: qty 2

## 2012-01-19 MED ORDER — PROMETHAZINE HCL 25 MG PO TABS
25.0000 mg | ORAL_TABLET | Freq: Once | ORAL | Status: DC
  Filled 2012-01-19: qty 1

## 2012-01-19 MED ORDER — CYCLOBENZAPRINE HCL 10 MG PO TABS: 10.0000 mg | ORAL_TABLET | Freq: Three times a day (TID) | ORAL | Status: DC | PRN

## 2012-01-19 MED ORDER — SODIUM CHLORIDE 0.9 % IV SOLN
INTRAVENOUS | Status: AC
  Administered 2012-01-20: 02:00:00 via INTRAVENOUS

## 2012-01-19 MED ORDER — SODIUM CHLORIDE 0.9 % IV BOLUS (SEPSIS)
1000.0000 mL | Freq: Once | INTRAVENOUS | Status: AC
  Administered 2012-01-19: 1000 mL via INTRAVENOUS

## 2012-01-19 MED ORDER — ALBUTEROL SULFATE HFA 108 (90 BASE) MCG/ACT IN AERS: 2.0000 | INHALATION_SPRAY | Freq: Four times a day (QID) | RESPIRATORY_TRACT | Status: DC | PRN

## 2012-01-19 MED ORDER — PROMETHAZINE HCL 25 MG/ML IJ SOLN
25.0000 mg | Freq: Once | INTRAMUSCULAR | Status: AC
  Administered 2012-01-19: 25 mg via INTRAVENOUS
  Filled 2012-01-19: qty 1

## 2012-01-19 MED ORDER — INSULIN ASPART 100 UNIT/ML ~~LOC~~ SOLN: 0.0000 [IU] | Freq: Three times a day (TID) | SUBCUTANEOUS | Status: DC

## 2012-01-19 MED ORDER — PERPHENAZINE 4 MG PO TABS: 4.0000 mg | ORAL_TABLET | Freq: Three times a day (TID) | ORAL | Status: DC | PRN

## 2012-01-19 MED ORDER — VERAPAMIL HCL 120 MG PO TABS
120.0000 mg | ORAL_TABLET | Freq: Two times a day (BID) | ORAL | Status: DC
  Administered 2012-01-20 (×2): 120 mg via ORAL
  Filled 2012-01-19 (×3): qty 1

## 2012-01-19 MED ORDER — TOPIRAMATE 100 MG PO TABS
100.0000 mg | ORAL_TABLET | Freq: Two times a day (BID) | ORAL | Status: DC
  Administered 2012-01-20 (×2): 100 mg via ORAL
  Filled 2012-01-19 (×3): qty 1

## 2012-01-19 MED ORDER — SODIUM CHLORIDE 0.9 % IV SOLN
Freq: Once | INTRAVENOUS | Status: AC
  Administered 2012-01-19: 20:00:00 via INTRAVENOUS

## 2012-01-19 MED ORDER — TOPIRAMATE 25 MG PO CPSP: 100.0000 mg | ORAL_CAPSULE | Freq: Two times a day (BID) | ORAL | Status: DC

## 2012-01-19 MED ORDER — INSULIN GLARGINE 100 UNIT/ML ~~LOC~~ SOLN
16.0000 [IU] | Freq: Every day | SUBCUTANEOUS | Status: DC
  Administered 2012-01-20: 16 [IU] via SUBCUTANEOUS

## 2012-01-19 NOTE — H&P (Signed)
[name] is an 36 y.o. female.   Patient was seen and examined on December 30, 2011. PCP - Dr. Madelin Rear. Chief Complaint: Left-sided weakness with headache and dizziness. HPI: 36 year-old female who has recently had a gastric bypass surgery last month obesity, history of diabetes mellitus type 2, OSA on CPAP started experiencing some lightheadedness while she was shopping. She felt dizzy but did not lose consciousness or did not fall. She also had weakness of the left upper and lower extremity and was brought to the ER. Subsequent to have weakness on the left side patient developed headache right frontal area and temporal area typical of her migraine. Patient also states he has some difficulty seeing the left eye. CT head was negative. Neurologist was consulted and at this time neurologist recommend MRI of the brain and feels patient symptoms may be more related to her migraine. Patient is able to move right upper and lower extremity has not had any difficulty swallowing or speaking denies any chest pain or shortness of breath nausea vomiting or diarrhea.  Past Medical History  Diagnosis Date  . Depression   . Anxiety   . Diabetes mellitus   . GERD (gastroesophageal reflux disease)   . ADD (attention deficit disorder)   . Hyperlipemia   . Herniated disc   . Allergy     Seasonal  . Diabetic neuropathy   . Shortness of breath     with exertion   . Sleep apnea     obsructive settings at 13   . Heart murmur     asa child  . Seizures     hx of / month ago - dehydration   . 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 Cholecystectomy   . Cesarean section   . Tonsillectomy   . Laporoscopy 2007  . Breath tek h pylori 09/11/2011    Procedure: BREATH TEK H PYLORI;  Surgeon: Mariella Saa, MD;  Location: [facility];  Service: General;  Laterality: N/A;  . Gastric roux-en-y 11/30/2011   Procedure: LAPAROSCOPIC ROUX-EN-Y GASTRIC;  Surgeon: Mariella Saa, MD;  Location: [facility];  Service: General;  Laterality: N/A;  . Bowel resection 11/30/2011    Procedure: SMALL BOWEL RESECTION;  Surgeon: Mariella Saa, MD;  Location: [facility];  Service: General;;    Family History  Problem Relation Age of Onset  . Diabetes type II    . Diabetes Mother   . Hypertension Mother   . Depression Brother   . Cancer Maternal Aunt     ovarian   . Depression Maternal Aunt   . Diabetes Maternal Aunt   . Cancer Maternal Grandmother     Lung  . COPD Maternal Grandmother   . Cancer Maternal Grandfather     Prostate  . Diabetes Maternal Grandfather   . Hearing loss Maternal Grandfather   . Hypertension Maternal Grandfather   . Stroke Maternal Grandfather   . Cancer Paternal Grandmother     Colon   Social History:  reports that she has never smoked. She has never used smokeless tobacco. She reports that she does not drink alcohol or use illicit drugs.  Allergies:  Allergies  Allergen Reactions  . Atarax (Hydroxyzine Hcl) Anaphylaxis  . Cephalosporins Anaphylaxis  . Codeine Anaphylaxis    Tolerates Dilaudid and Percocet.  . Compazine Anaphylaxis    Tolerates  Phenergan.  . Decadron (Dexamethasone) Anaphylaxis  . Demerol Anaphylaxis  . Metoclopramide Hcl Anaphylaxis  . Morphine And Related Anaphylaxis    Tolerates Dilaudid and Percocet.  Marland Kitchen Penicillins Anaphylaxis  . Sulfa Antibiotics Anaphylaxis  . Triptans Anaphylaxis  . Zofran Anaphylaxis  . Pork-Derived Products Other (See Comments)    Religous reasons--per pt ok to take Heparin when medically necessary  . Shellfish-Derived Products Other (See Comments)    Religous reasons     (Not in a hospital admission)  Results for orders placed during the hospital encounter of 01/19/12 (from the past 48 hour(s))  CBC WITH DIFFERENTIAL     Status: Normal   Collection Time   01/19/12  4:43 PM      Component Value Range Comment     WBC 9.5  4.0 - 10.5 K/uL    RBC 5.02  3.87 - 5.11 MIL/uL    Hemoglobin 13.3  12.0 - 15.0 g/dL    HCT 81.1  91.4 - 78.2 %    MCV 81.1  78.0 - 100.0 fL    MCH 26.5  26.0 - 34.0 pg    MCHC 32.7  30.0 - 36.0 g/dL    RDW 95.6  21.3 - 08.6 %    Platelets 244  150 - 400 K/uL    Neutrophils Relative 67  43 - 77 %    Neutro Abs 6.4  1.7 - 7.7 K/uL    Lymphocytes Relative 25  12 - 46 %    Lymphs Abs 2.4  0.7 - 4.0 K/uL    Monocytes Relative 6  3 - 12 %    Monocytes Absolute 0.6  0.1 - 1.0 K/uL    Eosinophils Relative 1  0 - 5 %    Eosinophils Absolute 0.1  0.0 - 0.7 K/uL    Basophils Relative 0  0 - 1 %    Basophils Absolute 0.0  0.0 - 0.1 K/uL   COMPREHENSIVE METABOLIC PANEL     Status: Abnormal   Collection Time   01/19/12  4:43 PM      Component Value Range Comment   Sodium 136  135 - 145 mEq/L    Potassium 4.5  3.5 - 5.1 mEq/L    Chloride 102  96 - 112 mEq/L    CO2 24  19 - 32 mEq/L    Glucose, Bld 114 (*) 70 - 99 mg/dL    BUN 11  6 - 23 mg/dL    Creatinine, Ser 5.78  0.50 - 1.10 mg/dL    Calcium 9.2  8.4 - 46.9 mg/dL    Total Protein 7.1  6.0 - 8.3 g/dL    Albumin 3.7  3.5 - 5.2 g/dL    AST 20  0 - 37 U/L    ALT 31  0 - 35 U/L    Alkaline Phosphatase 68  39 - 117 U/L    Total Bilirubin 0.2 (*) 0.3 - 1.2 mg/dL    GFR calc non Af Amer >90  >90 mL/min    GFR calc Af Amer >90  >90 mL/min   LIPASE, BLOOD     Status: Normal   Collection Time   01/19/12  4:43 PM      Component Value Range Comment   Lipase 25  11 - 59 U/L    Ct Head Wo Contrast  01/19/2012  *RADIOLOGY REPORT*  Clinical Data: Headache, left hemiplegia  CT HEAD WITHOUT CONTRAST  Technique:  Contiguous axial images were obtained from the  base of the skull through the vertex without contrast.  Comparison: 07/27/2011  Findings: Mild frontal atrophy. There is no evidence of acute intracranial hemorrhage, brain edema, mass lesion, acute infarction,   mass effect, or midline shift. Acute infarct may be inapparent on  noncontrast CT.  No other intra-axial abnormalities are seen, and the ventricles and sulci are within normal limits in size and symmetry.   No abnormal extra-axial fluid collections or masses are identified.  No significant calvarial abnormality.  IMPRESSION: 1. Negative for bleed or other acute intracranial process.   Original Report Authenticated By: Osa Craver, M.D.     Review of Systems  Constitutional: Negative.   Eyes: Negative.   Respiratory: Negative.   Cardiovascular: Negative.   Gastrointestinal: Negative.   Genitourinary: Negative.   Musculoskeletal: Negative.   Skin: Negative.   Neurological: Positive for dizziness and headaches.       Left sided weakness.  Endo/Heme/Allergies: Negative.   Psychiatric/Behavioral: Negative.     Blood pressure 99/70, pulse 89, temperature 99 F (37.2 C), temperature source Oral, resp. rate 25, last menstrual period 12/29/2011, SpO2 100.00%. Physical Exam  Constitutional: She is oriented to person, place, and time. She appears well-developed and well-nourished. No distress.  HENT:  Head: Normocephalic and atraumatic.  Right Ear: External ear normal.  Left Ear: External ear normal.  Nose: Nose normal.  Mouth/Throat: Oropharynx is clear and moist. No oropharyngeal exudate.  Eyes: Conjunctivae normal are normal. Pupils are equal, round, and reactive to light. Right eye exhibits no discharge. Left eye exhibits no discharge. No scleral icterus.  Neck: Normal range of motion. Neck supple.  Cardiovascular: Normal rate and regular rhythm.   Respiratory: Effort normal and breath sounds normal. No respiratory distress. She has no wheezes. She has no rales.  GI: Soft. Bowel sounds are normal. She exhibits no distension. There is no tenderness. There is no rebound.  Musculoskeletal: She exhibits no edema and no tenderness.  Neurological: She is alert and oriented to person, place, and time.       Moves her right upper and lower extremities.  But does not move her left upper and lower extremities. No facial asymmetry. Not able to see thru left eye.  Skin: Skin is warm and dry. She is not diaphoretic.     Assessment/Plan #1. Left-sided weakness with dizziness -  Patient will be placed on neurochecks. MRI of the brain has been ordered. Patient's symptoms may be related to her migraine or possible conversion disorder. If MRI is positive for any stroke then further stroke workup. #2. Diabetes mellitus2 - continue home medications with sliding-scale coverage #3. OSA on CPAP - CPAP per respiratory. #4. History of ADD - continue home medications. #5. Recent gastric bypass surgery - no acute issues.   CODE STATUS - full code.  Ariellah Faust N. 01/19/2012, 10:14 PM

## 2012-01-19 NOTE — Consult Note (Addendum)
NEURO HOSPITALIST CONSULT NOTE    Reason for Consult:  New-onset left-sided weakness.  HPI:                                                                                                                                          [name] is an 36 y.o. female with a history of diabetes mellitus, hyperlipidemia, anxiety and depression, sleep apnea, migraine headaches and recent gastric bypass surgery, presenting with new onset headache and left-sided weakness which started early this morning. Patient also complained of feeling dizzy. Headache intensity is very from 5-7/10. Headache is milder than her typical migraine headaches. She's had no difficulty with swallowing normal speech. She indicates at this point she is unable to move her left extremities. CT scan of her head showed no acute intracranial abnormality.  Past Medical History  Diagnosis Date  . Depression   . Anxiety   . Diabetes mellitus   . GERD (gastroesophageal reflux disease)   . ADD (attention deficit disorder)   . Hyperlipemia   . Herniated disc   . Allergy     Seasonal  . Diabetic neuropathy   . Shortness of breath     with exertion   . Sleep apnea     obsructive settings at 13   . Heart murmur     asa child  . Seizures     hx of / month ago - dehydration   . 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 Cholecystectomy   . Cesarean section   . Tonsillectomy   . Laporoscopy 2007  . Breath tek h pylori 09/11/2011    Procedure: BREATH TEK H PYLORI;  Surgeon: Mariella Saa, MD;  Location: [facility];  Service: General;  Laterality: N/A;  . 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 Bowel resection 11/30/2011    Procedure: SMALL BOWEL RESECTION;  Surgeon: Mariella Saa, MD;  Location: [facility];  Service: General;;    Family History  Problem Relation Age of Onset  . Diabetes type II    . Diabetes Mother   . Hypertension Mother   . Depression Brother   . Cancer Maternal Aunt     ovarian   . Depression Maternal Aunt   . Diabetes Maternal Aunt   . Cancer Maternal Grandmother     Lung  . COPD Maternal Grandmother   . Cancer Maternal Grandfather     Prostate  . Diabetes Maternal Grandfather   . Hearing loss Maternal Grandfather   . Hypertension  Maternal Grandfather   . Stroke Maternal Grandfather   . Cancer Paternal Grandmother     Colon    Social History:  reports that she has never smoked. She has never used smokeless tobacco. She reports that she does not drink alcohol or use illicit drugs.  Allergies  Allergen Reactions  . Atarax (Hydroxyzine Hcl) Anaphylaxis  . Cephalosporins Anaphylaxis  . Codeine Anaphylaxis    Tolerates Dilaudid and Percocet.  . Compazine Anaphylaxis    Tolerates Phenergan.  . Decadron (Dexamethasone) Anaphylaxis  . Demerol Anaphylaxis  . Metoclopramide Hcl Anaphylaxis  . Morphine And Related Anaphylaxis    Tolerates Dilaudid and Percocet.  Marland Kitchen Penicillins Anaphylaxis  . Sulfa Antibiotics Anaphylaxis  . Triptans Anaphylaxis  . Zofran Anaphylaxis  . Pork-Derived Products Other (See Comments)    Religous reasons--per pt ok to take Heparin when medically necessary  . Shellfish-Derived Products Other (See Comments)    Religous reasons    MEDICATIONS:                                                                                                                     Albuterol 2 puffs every 6 hours when necessary Xanax 1 mg 3 times a day when necessary Bepreve eyedrops 1.5% 3 times a day when necessary Flexeril 10 mg 3 times a day when necessary for headache Lantus 16 units at bedtime Nizoral 2% shampoo when necessary Methylphenidate 60 mg per day Multivitamin 2 per day Trilafon 4 mg 3 times a day when  necessary Phenergan 6.25 mg 4 teaspoons 4 times a day when necessary Topamax 100 mg twice a day Verapamil 120 mg twice a day  Blood pressure 104/60, pulse 99, temperature 98.8 F (37.1 C), temperature source Oral, resp. rate 18, last menstrual period 12/29/2011, SpO2 100.00%.   Neurologic Examination:                                                                                                      Mental Status: Alert, oriented, thought content appropriate.  No acute distress. Speech fluent without evidence of aphasia. Able to follow commands without difficulty. Cranial Nerves: II-Visual fields were normal. III/IV/VI-Pupils were equal and reacted. Extraocular movements were full and conjugate.    V/VII-subjective decrease in facial sensation left; no facial weakness. VIII-normal. X-normal speech and symmetrical palatal movement. Motor: Able to move left extremities when distracted but refuses to move left extremities on command. Patient was able to maintain left upper extremity against gravity until she became aware that she was doing so. Muscle tone was normal throughout. Strength of right extremities  was normal. Sensory: Normal throughout. Deep Tendon Reflexes: 2+ and symmetric. Plantars: Mute on the left and flexor on the right. Cerebellar: Normal finger-to-nose testing with use of right upper extremity. Carotid auscultation: Normal     Ct Head Wo Contrast  01/19/2012  *RADIOLOGY REPORT*  Clinical Data: Headache, left hemiplegia  CT HEAD WITHOUT CONTRAST  Technique:  Contiguous axial images were obtained from the base of the skull through the vertex without contrast.  Comparison: 07/27/2011  Findings: Mild frontal atrophy. There is no evidence of acute intracranial hemorrhage, brain edema, mass lesion, acute infarction,   mass effect, or midline shift. Acute infarct may be inapparent on noncontrast CT.  No other intra-axial abnormalities are seen, and the ventricles and sulci are  within normal limits in size and symmetry.   No abnormal extra-axial fluid collections or masses are identified.  No significant calvarial abnormality.  IMPRESSION: 1. Negative for bleed or other acute intracranial process.   Original Report Authenticated By: Osa Craver, M.D.     Assessment/Plan: No objective signs of an acute neurological deficit. Patient's lack of voluntary movement of her left side appears to be psychophysiologic in etiology. No indication of significant distress from headache. Stroke or TIA is unlikely.  Recommendations: 1. MRI of the brain without contrast. 2. Stroke workup if MRI shows indication of acute ischemic abnormality; otherwise, no further workup is indicated. 3. Aspirin 81 mg per day if not contraindicated.   Venetia Maxon M.D. [facility] [phone]  01/19/2012, 7:49 PM

## 2012-01-19 NOTE — ED Notes (Signed)
Pt resting with eyes closed, easily arousable. Pt INAD, resp e/u, RN introduced self to pt with plan of care updated. Will continue to monitor pt, pt has dimmed lights for comfort measures.

## 2012-01-19 NOTE — ED Notes (Signed)
Pt request for RN to "call my husband and give him an update". RN informed pt it is again HIPPA violation to given out information over the phone. Pt states "well they have done it before when i give the authorization". Pt informed RN will not given out information over the phone inspite of past experience due to violation of HIPPA and unaware of person relation over the phone. Inform pt RN or MD can update her as a patient and she can update spouse. Pt states "can I speak to a charge nurse", Charge RN [place] made aware and to follow up.

## 2012-01-19 NOTE — ED Provider Notes (Signed)
History     CSN: [id number]  Arrival date & time 01/19/12  1604   None     No chief complaint on file.   (Consider location/radiation/quality/duration/timing/severity/associated sxs/prior treatment) HPI Comments: History of complicated migraines and hemiplegic migraines.  Has had symptoms since last night including generalized and left-sided weakness, nausea, vomiting x1, and just prior to arrival while shopping at [employer] she had a presyncopal episode per EMS.  Per patient she became lightheaded and had to sit down because she felt like she was going to blackout.  Afterwards she then had a right-sided dull 7/10 headache that is similar to prior headaches.  On presentation here she says she cannot move her left leg or left arm.  She has had this before but she states that it feels slightly different this time.  Patient is a 36 y.o. female presenting with neurologic complaint. The history is provided by the patient and the EMS personnel. No language interpreter was used.  Neurologic Problem The primary symptoms include headaches, visual change, paresthesias, focal weakness, speech change, nausea and vomiting. Primary symptoms do not include syncope, loss of consciousness, altered mental status, seizures, dizziness, loss of sensation, memory loss or fever. The symptoms began 6 to 12 hours ago. The episode lasted 1 day. The symptoms are worsening. The neurological symptoms are focal.  The headache began today. The headache developed suddenly. Headache is a recurrent problem. The headache is present intermittently. The pain from the headache is at a severity of 7/10. Location/region(s) of the headache: right unilateral. The headache is associated with photophobia, visual change, paresthesias, weakness (generalized but worse on left) and loss of balance. The headache is not associated with neck stiffness.  The visual change includes photophobia.  Additional symptoms include weakness (generalized  but worse on left), loss of balance and photophobia. Additional symptoms do not include neck stiffness or lower back pain. Medical issues also include diabetes and recent surgery. Medical issues do not include seizures or cerebral vascular accident. Workup history includes CT scan.    Past Medical History  Diagnosis Date  . Depression   . Anxiety   . Diabetes mellitus   . GERD (gastroesophageal reflux disease)   . ADD (attention deficit disorder)   . Hyperlipemia   . Herniated disc   . Allergy     Seasonal  . Diabetic neuropathy   . Shortness of breath     with exertion   . Sleep apnea     obsructive settings at 13   . Heart murmur     asa child  . Seizures     hx of / month ago - dehydration   . 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 Cholecystectomy   . Cesarean section   . Tonsillectomy   . Laporoscopy 2007  . Breath tek h pylori 09/11/2011    Procedure: BREATH TEK H PYLORI;  Surgeon: Mariella Saa, MD;  Location: [facility];  Service: General;  Laterality: N/A;  . 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 Bowel resection 11/30/2011    Procedure: SMALL BOWEL RESECTION;  Surgeon: Mariella Saa, MD;  Location: [facility];  Service: General;;    Family History  Problem Relation Age of Onset  .  Diabetes type II    . Diabetes Mother   . Hypertension Mother   . Depression Brother   . Cancer Maternal Aunt     ovarian   . Depression Maternal Aunt   . Diabetes Maternal Aunt   . Cancer Maternal Grandmother     Lung  . COPD Maternal Grandmother   . Cancer Maternal Grandfather     Prostate  . Diabetes Maternal Grandfather   . Hearing loss Maternal Grandfather   . Hypertension Maternal Grandfather   . Stroke Maternal Grandfather   . Cancer  Paternal Grandmother     Colon    History  Substance Use Topics  . Smoking status: Never Smoker   . Smokeless tobacco: Never Used  . Alcohol Use: No    OB History    Grav Para Term Preterm Abortions TAB SAB Ect Mult Living                  Review of Systems  Constitutional: Negative for fever, chills, activity change and appetite change.  HENT: Negative for congestion, rhinorrhea, neck pain, neck stiffness and sinus pressure.   Eyes: Positive for photophobia. Negative for discharge and visual disturbance.  Respiratory: Negative for cough, chest tightness, shortness of breath, wheezing and stridor.   Cardiovascular: Negative for chest pain, leg swelling and syncope.  Gastrointestinal: Positive for nausea and vomiting. Negative for abdominal pain, diarrhea and abdominal distention.  Genitourinary: Negative for decreased urine volume and difficulty urinating.  Musculoskeletal: Negative for back pain and arthralgias.  Skin: Negative for color change and pallor.  Neurological: Positive for speech change, focal weakness, weakness (generalized but worse on left), headaches, paresthesias and loss of balance. Negative for dizziness, seizures, loss of consciousness and light-headedness.  Psychiatric/Behavioral: Negative for memory loss, behavioral problems, agitation and altered mental status.  All other systems reviewed and are negative.    Allergies  Atarax; Cephalosporins; Codeine; Compazine; Decadron; Demerol; Metoclopramide hcl; Morphine and related; Penicillins; Sulfa antibiotics; Triptans; Zofran; Pork-derived products; and Shellfish-derived products  Home Medications   Current Outpatient Rx  Name Route Sig Dispense Refill  . ALBUTEROL SULFATE HFA 108 (90 BASE) MCG/ACT IN AERS Inhalation Inhale 2 puffs into the lungs every 6 (six) hours as needed. For shortness of breath.    . ALPRAZOLAM 1 MG PO TABS Oral Take 1 mg by mouth 4 (four) times daily.    Marland Kitchen BEPOTASTINE BESILATE 1.5 %  OP SOLN      . CLOBETASOL PROPIONATE 0.05 % EX CREA Topical Apply topically 2 (two) times daily.    . CYCLOBENZAPRINE HCL 10 MG PO TABS Oral Take 10 mg by mouth 3 (three) times daily as needed. For headaches    . INSULIN GLARGINE 100 UNIT/ML [place] SOLN Subcutaneous Inject 20 Units into the skin daily after supper. 10 mL 0  . KETOCONAZOLE 2 % EX SHAM Topical Apply 1 application topically daily as needed. For scalp itching/pain.    . METHYLPHENIDATE HCL ER 25 MG/5ML PO SUSR Oral Take 60 mg by mouth daily.    . ADULT GUMMY PO CHEW Oral Chew 2 tablets by mouth daily.    Letta Pate DELICA LANCETS MISC      . PERPHENAZINE 4 MG PO TABS Oral Take 4 mg by mouth 3 (three) times daily.    Marland Kitchen PROMETHAZINE HCL 6.25 MG/5ML PO SYRP Oral Take 25 mg by mouth 4 (four) times daily as needed. For nausea.    Marland Kitchen PHENERGAN IJ Injection Inject 25 mg as directed  4 (four) times daily as needed. Nausea    . TOPIRAMATE 25 MG PO CPSP Oral Take 100 mg by mouth 2 (two) times daily. Open capsules and mix in food/drink and consume all at once    . VERAPAMIL HCL 40 MG PO TABS Oral Take 120 mg by mouth 2 (two) times daily.       There were no vitals taken for this visit.  Physical Exam  Nursing note and vitals reviewed. Constitutional: She is oriented to person, place, and time. She appears well-developed and well-nourished. No distress.  HENT:  Head: Normocephalic and atraumatic.  Mouth/Throat: No oropharyngeal exudate.  Eyes: EOM are normal. Pupils are equal, round, and reactive to light. Right eye exhibits no discharge. Left eye exhibits no discharge.  Neck: Normal range of motion. Neck supple. No JVD present.  Cardiovascular: Normal rate, regular rhythm and normal heart sounds.   Pulmonary/Chest: Effort normal and breath sounds normal. No stridor. No respiratory distress. She exhibits no tenderness.  Abdominal: Soft. Bowel sounds are normal. She exhibits no distension. There is no tenderness. There is no guarding.    Musculoskeletal: Normal range of motion. She exhibits no edema and no tenderness.  Neurological: She is alert and oriented to person, place, and time. A cranial nerve deficit (bilateral symmetric facial weakness, has difficulty smiling and opening mouth completely, difficulty sticking tongue out completely but it does not deviate to one side.) is present. She exhibits abnormal muscle tone (0/5 LUE and LLE. also w/ sensory deficit to pain and sharp sensations over entire extremities on left. 5/5 RUE and RLE).  Skin: Skin is warm and dry. No rash noted. She is not diaphoretic.  Psychiatric: She has a normal mood and affect. Her behavior is normal. Judgment and thought content normal.    ED Course  Procedures (including critical care time)  Labs Reviewed  COMPREHENSIVE METABOLIC PANEL - Abnormal; Notable for the following:    Glucose, Bld 114 (*)     Total Bilirubin 0.2 (*)     All other components within normal limits  CBC WITH DIFFERENTIAL  LIPASE, BLOOD  URINALYSIS, ROUTINE W REFLEX MICROSCOPIC  COMPREHENSIVE METABOLIC PANEL  CBC WITH DIFFERENTIAL  TSH  URINE RAPID DRUG SCREEN (HOSP PERFORMED)  HEMOGLOBIN A1C  LIPID PANEL   Ct Head Wo Contrast  01/19/2012  *RADIOLOGY REPORT*  Clinical Data: Headache, left hemiplegia  CT HEAD WITHOUT CONTRAST  Technique:  Contiguous axial images were obtained from the base of the skull through the vertex without contrast.  Comparison: 07/27/2011  Findings: Mild frontal atrophy. There is no evidence of acute intracranial hemorrhage, brain edema, mass lesion, acute infarction,   mass effect, or midline shift. Acute infarct may be inapparent on noncontrast CT.  No other intra-axial abnormalities are seen, and the ventricles and sulci are within normal limits in size and symmetry.   No abnormal extra-axial fluid collections or masses are identified.  No significant calvarial abnormality.  IMPRESSION: 1. Negative for bleed or other acute intracranial process.    Original Report Authenticated By: Thora Lance III, M.D.      1. Hemiplegic migraine   2. Diabetes mellitus   3. Hemiplegia affecting left nondominant side   4. History of migraine   5. OSA on CPAP       MDM  The patient was initially worked up for a possible stroke versus a complicated migraine.  She had a normal head CT, unremarkable labs, and remained stable during her ED stay.  Neurology  was consult for due to her deficits and they planned to observe the patient.  The patient was admitted to medicine since she was not back to baseline and continued to have weakness of her left arm and left leg.  The patient also continued to request dilaudid for her headache but I explained that because of her borderline blood pressure and drowsiness we would have to withhold narcotic pain medicine at this time.  Admitted in stable condition.  Likely complicated migraine versus conversion disorder.  I doubt she had a stroke, but doubt subarachnoid hemorrhage, doubt meningitis or encephalitis, doubt dural sinus venous thrombosis.        Warrick Parisian, MD 01/20/12 [phone]

## 2012-01-19 NOTE — ED Notes (Signed)
Attempted to call report, RN unavailable and to call back for pt report. Will continue to monitor pt. 

## 2012-01-19 NOTE — ED Notes (Addendum)
Per EMS pt wakes up and went to the bathroom and noticed unsteadiness and weakness at 0600 this morning. 1 hr prior to coming to the hospital she went to the store and had a near syncope episode. She reports a headache on the rt side that is dull, rates pain a 7/10 and constant. Has a hx of migraines but this migraine is different because the symptoms were more acute and more drastic. Left sided facial droop, with weakness in left extremities.

## 2012-01-19 NOTE — ED Notes (Signed)
Admitting MD at bedside, pt awaiting inpt beds assignment.  

## 2012-01-19 NOTE — ED Notes (Signed)
Pt informed urine is needed, pt states "i cant pee right now". Pt is hypotensive and requested for pain medications. Pt is informed of vital signs, MD is made aware of pt request and vital signs with verbal order for NS to be given. Pt INAD, plan of care is updated with verbal understanding and will continue to monitor pt.

## 2012-01-20 ENCOUNTER — Observation Stay (HOSPITAL_COMMUNITY): Payer: BC Managed Care – PPO

## 2012-01-20 DIAGNOSIS — E119 Type 2 diabetes mellitus without complications: Secondary | ICD-10-CM | POA: Diagnosis not present

## 2012-01-20 DIAGNOSIS — F449 Dissociative and conversion disorder, unspecified: Secondary | ICD-10-CM

## 2012-01-20 DIAGNOSIS — G43409 Hemiplegic migraine, not intractable, without status migrainosus: Secondary | ICD-10-CM | POA: Diagnosis not present

## 2012-01-20 DIAGNOSIS — IMO0001 Reserved for inherently not codable concepts without codable children: Secondary | ICD-10-CM | POA: Diagnosis not present

## 2012-01-20 LAB — CBC WITH DIFFERENTIAL/PLATELET
Eosinophils Absolute: 0.2 10*3/uL (ref 0.0–0.7)
Eosinophils Relative: 2 % (ref 0–5)
HCT: 40.8 % (ref 36.0–46.0)
Lymphocytes Relative: 37 % (ref 12–46)
Lymphs Abs: 3.4 10*3/uL (ref 0.7–4.0)
MCH: 26.2 pg (ref 26.0–34.0)
MCV: 82.8 fL (ref 78.0–100.0)
Monocytes Absolute: 0.4 10*3/uL (ref 0.1–1.0)
RBC: 4.93 MIL/uL (ref 3.87–5.11)
WBC: 9.1 10*3/uL (ref 4.0–10.5)

## 2012-01-20 LAB — GLUCOSE, CAPILLARY: Glucose-Capillary: 160 mg/dL — ABNORMAL HIGH (ref 70–99)

## 2012-01-20 LAB — COMPREHENSIVE METABOLIC PANEL
AST: 19 U/L (ref 0–37)
Albumin: 3.4 g/dL — ABNORMAL LOW (ref 3.5–5.2)
Alkaline Phosphatase: 64 U/L (ref 39–117)
BUN: 8 mg/dL (ref 6–23)
Creatinine, Ser: 0.59 mg/dL (ref 0.50–1.10)
Potassium: 3.8 mEq/L (ref 3.5–5.1)
Total Protein: 6.3 g/dL (ref 6.0–8.3)

## 2012-01-20 LAB — HEMOGLOBIN A1C: Mean Plasma Glucose: 134 mg/dL — ABNORMAL HIGH (ref <117)

## 2012-01-20 LAB — URINALYSIS, ROUTINE W REFLEX MICROSCOPIC
Bilirubin Urine: NEGATIVE
Hgb urine dipstick: NEGATIVE
Specific Gravity, Urine: 1.016 (ref 1.005–1.030)
Urobilinogen, UA: 0.2 mg/dL (ref 0.0–1.0)

## 2012-01-20 LAB — URINE MICROSCOPIC-ADD ON

## 2012-01-20 LAB — LIPID PANEL
Cholesterol: 153 mg/dL (ref 0–200)
Total CHOL/HDL Ratio: 5.5 RATIO
Triglycerides: 199 mg/dL — ABNORMAL HIGH (ref <150)
VLDL: 40 mg/dL (ref 0–40)

## 2012-01-20 LAB — RAPID URINE DRUG SCREEN, HOSP PERFORMED
Amphetamines: NOT DETECTED
Barbiturates: NOT DETECTED
Benzodiazepines: POSITIVE — AB
Tetrahydrocannabinol: NOT DETECTED

## 2012-01-20 LAB — PREGNANCY, URINE: Preg Test, Ur: NEGATIVE

## 2012-01-20 MED ORDER — PROMETHAZINE HCL 25 MG/ML IJ SOLN
12.5000 mg | Freq: Four times a day (QID) | INTRAMUSCULAR | Status: DC | PRN
  Administered 2012-01-20 (×2): 12.5 mg via INTRAVENOUS
  Filled 2012-01-20 (×2): qty 1

## 2012-01-20 MED ORDER — HYDROMORPHONE HCL PF 1 MG/ML IJ SOLN
0.5000 mg | INTRAMUSCULAR | Status: DC | PRN
  Administered 2012-01-20 (×3): 0.5 mg via INTRAVENOUS
  Filled 2012-01-20 (×3): qty 1

## 2012-01-20 MED ORDER — OXYCODONE-ACETAMINOPHEN 5-325 MG PO TABS: 1.0000 | ORAL_TABLET | ORAL | Status: DC | PRN

## 2012-01-20 NOTE — Progress Notes (Signed)
Discharge orders received.  Pt stable and education is complete.  Pt to be discharged home with husband. [name], [name]

## 2012-01-20 NOTE — Progress Notes (Signed)
   CARE MANAGEMENT NOTE 01/20/2012  Patient:  [name], [name]   Account Number:  [id number]  Date Initiated:  01/20/2012  Documentation initiated by:  [facility]  Subjective/Objective Assessment:   migraines, Gastric Bypass, syncopal episode     Action/Plan:   lives at home with husband   Anticipated DC Date:  01/20/2012   Anticipated DC Plan:  HOME W HOME HEALTH SERVICES      DC Planning Services  CM consult      [facility] Choice  HOME HEALTH   Choice offered to / List presented to:  C-1 Patient        HH arranged  HH-2 PT  HH-3 OT      [facility] agency  [facility]   Status of service:  Completed, signed off Medicare Important Message given?   (If response is "NO", the following Medicare IM given date fields will be blank) Date Medicare IM given:   Date Additional Medicare IM given:    Discharge Disposition:  HOME W HOME HEALTH SERVICES  Per UR Regulation:    If discussed at Long Length of Stay Meetings, dates discussed:    Comments:  01/20/2012 1130 [facility] cannot accept referral. [facility] notified for [facility] for scheduled d/c home today. NCM made patient aware of [facility] cannot accept referral at this time due to insurance agency cap for the week. [facility] contact info added to d/c instructions. Isidoro Donning RN CCM Case Mgmt phone [phone]  01/20/2012 1100 NCM spoke to pt and states she has all her DME at home. Has RW at home. She is on disability for her migraine headaches. She follows up with surgeon, Psychiatrist and Endocrinologist. States she has not see her PCP, Dr Larwance Sachs since 03/2011 has an appt in 02/2012. NCM states she will need to schedule an appt to see PCP post d/c hospital stay. States she had [place] for [facility] in the past. Explained NCM will follow with [place] for availability for [facility]. Isidoro Donning RN CCM Case Mgmt phone [phone]

## 2012-01-20 NOTE — ED Provider Notes (Signed)
I saw and evaluated the patient, reviewed the resident's note and I agree with the findings and plan.   Alajia Schmelzer, MD 01/20/12 0044 

## 2012-01-20 NOTE — Progress Notes (Signed)
[facility]  Patient Status: New  [facility] is providing the following services: PT and OT  If patient discharges after hours, please call [phone].   [name] 01/20/2012, 12:16 PM

## 2012-01-20 NOTE — Progress Notes (Signed)
Placed pt on CPAP at a pressure of 12 cmH2O per home settings stated by the pt. Will continue to monitor.

## 2012-01-20 NOTE — Discharge Summary (Signed)
Physician Discharge Summary  [name] FAO:[id number] DOB: Mar 11, 1976 DOA: 01/19/2012  PCP: Berenda Morale, MD  Admit date: 01/19/2012 Discharge date: 01/20/2012  Time spent: > 35 minutes. Medical decision making, placing orders, discussing results with patient and husband, calling pcp office to have them set up post hospital f/u appointment.  Recommendations for Outpatient Follow-up:  1. F/u on blood pressures and adjust medications as needed 2. Avoid narcotics for treatment of migraines per Neurologist recommendations 3. See if patient will require prolonged PT/OT therapy 4. F/u with blood sugars.  Discharge Diagnoses:  Active Problems:  Hemiplegia affecting left nondominant side  Diabetes mellitus  OSA on CPAP  History of migraine   Discharge Condition: stable  Diet recommendation: bariatric  Filed Weights   01/19/12 2300  Weight: 115.3 kg (254 lb 3.1 oz)    History of present illness:  From original HPI: 36 year-old female who has recently had a gastric bypass surgery last month obesity, history of diabetes mellitus type 2, OSA on CPAP started experiencing some lightheadedness while she was shopping. She felt dizzy but did not lose consciousness or did not fall. She also had weakness of the left upper and lower extremity and was brought to the ER. Subsequent to have weakness on the left side patient developed headache right frontal area and temporal area typical of her migraine. Patient also states he has some difficulty seeing the left eye. CT head was negative. Neurologist was consulted and at this time neurologist recommend MRI of the brain and feels patient symptoms may be more related to her migraine. Patient is able to move right upper and lower extremity has not had any difficulty swallowing or speaking denies any chest pain or shortness of breath nausea vomiting or diarrhea.   Hospital Course:  Left sided hemiparesis - Neurologist has evaluated patient. Their  assessment/plan is as follows: Hemiplegia affecting left nondominant side (07/28/2011) Assessment: Continues to have effort dependent left sided weakness. MRI negative for acute infarct.  Plan: Continue PT/OT  Assessment: Continues to have 8/10 HA.  Plan: Continue to treat HA symptomatically. Would avoid Narcotics if possible. As per previous chart note 07/27/11, Patient had seen Dr. Catalina Lunger in [facility] as out patient for HA . During consultation by phone he had described narcotic seeking behavior. May consider trial of Depakote 500 mg IV over 15 minutes X1.   Hypertension: Patient is on verapamil at home.  Given her lower normal blood pressure reading will plan on holding on discharge.  Have discussed with her husband who verbalizes agreement.   - Patient had episode of hypotension after receiving dilaudid IV while in house. Blood pressure came back up and has gone as high as 122/78 while in house. - Last 6 blood pressure readings have been WNL's.  Will not d/c on dilaudid and will hold verapamil as such will plan on discharging patient at this juncture.  Procedures:  MRI and CT of head  Consultations:  Neurology: Felicie Morn PA  Discharge Exam: Filed Vitals:   01/19/12 2300 01/20/12 0200 01/20/12 0600 01/20/12 1035  BP: 122/78 117/76 114/67 99/55  Pulse: 88 79 77 92  Temp: 97.8 F (36.6 C) 97.3 F (36.3 C) 97.8 F (36.6 C) 97.9 F (36.6 C)  TempSrc: Oral Oral Oral Oral  Resp: 22 20 20 20   Height: 5\' 7"  (1.702 m)     Weight: 115.3 kg (254 lb 3.1 oz)     SpO2: 97% 100% 100% 93%    General: Pt in NAD, Alert  and awake Cardiovascular: RRR, No MRG Respiratory: CTA BL, no wheezes Abdomen: NT, ND  Discharge Instructions  Discharge Orders    Future Appointments: Provider: Department: Dept Phone: Center:   01/26/2012 3:00 PM Orvil Feil Himmelrich, MS, RD [facility] [phone] NDM   03/04/2012 4:45 PM Mariella Saa, MD [facility] [phone] None     Future  Orders Please Complete By Expires   Diet - low sodium heart healthy      Increase activity slowly      Discharge instructions      Comments:   Please be sure to f/u with PCP in 1-2 weeks or sooner should there be any new concerns.  Also please stop taking your blood pressure medication and log your blood pressures daily.  Discuss your results with your primary care physicain for further recommendations.  Discussed with primary care secretary who will contact patient for follow up appointment.   Call MD for:  temperature >100.4      Call MD for:  persistant nausea and vomiting      Call MD for:  severe uncontrolled pain      Call MD for:  difficulty breathing, headache or visual disturbances      Call MD for:  extreme fatigue          Medication List     As of 01/20/2012 11:22 AM    STOP taking these medications         PHENERGAN IJ      verapamil 40 MG tablet   Commonly known as: CALAN      TAKE these medications         ADULT GUMMY Chew   Chew 2 tablets by mouth daily.      albuterol 108 (90 BASE) MCG/ACT inhaler   Commonly known as: PROVENTIL HFA;VENTOLIN HFA   Inhale 2 puffs into the lungs every 6 (six) hours as needed. For shortness of breath.      ALPRAZolam 1 MG tablet   Commonly known as: XANAX   Take 1 mg by mouth 3 (three) times daily as needed. For anxiety      BEPREVE 1.5 % Soln   Generic drug: Bepotastine Besilate   Apply 1 drop to eye 3 (three) times daily as needed. Itching/allergies      cyclobenzaprine 10 MG tablet   Commonly known as: FLEXERIL   Take 10 mg by mouth 3 (three) times daily as needed. For headaches      insulin glargine 100 UNIT/ML injection   Commonly known as: LANTUS   Inject 16 Units into the skin at bedtime.      ketoconazole 2 % shampoo   Commonly known as: NIZORAL   Apply 1 application topically daily as needed. For scalp itching/pain.      perphenazine 4 MG tablet   Commonly known as: TRILAFON   Take 4 mg by mouth 3  (three) times daily as needed. Nausea/vomiting      promethazine 6.25 MG/5ML syrup   Commonly known as: PHENERGAN   Take 25 mg by mouth 4 (four) times daily as needed. For nausea.      QUILLIVANT XR PO   Take 60 mg by mouth daily.      topiramate 25 MG capsule   Commonly known as: TOPAMAX   Take 100 mg by mouth 2 (two) times daily. Open capsules and mix in food/drink and consume all at once          The  results of significant diagnostics from this hospitalization (including imaging, microbiology, ancillary and laboratory) are listed below for reference.    Significant Diagnostic Studies: Ct Head Wo Contrast  01/19/2012  *RADIOLOGY REPORT*  Clinical Data: Headache, left hemiplegia  CT HEAD WITHOUT CONTRAST  Technique:  Contiguous axial images were obtained from the base of the skull through the vertex without contrast.  Comparison: 07/27/2011  Findings: Mild frontal atrophy. There is no evidence of acute intracranial hemorrhage, brain edema, mass lesion, acute infarction,   mass effect, or midline shift. Acute infarct may be inapparent on noncontrast CT.  No other intra-axial abnormalities are seen, and the ventricles and sulci are within normal limits in size and symmetry.   No abnormal extra-axial fluid collections or masses are identified.  No significant calvarial abnormality.  IMPRESSION: 1. Negative for bleed or other acute intracranial process.   Original Report Authenticated By: Osa Craver, M.D.    Mri Brain Without Contrast  01/20/2012  *RADIOLOGY REPORT*  Clinical Data: Headache.  Left hemiplegia  MRI HEAD WITHOUT CONTRAST  Technique:  Multiplanar, multiecho pulse sequences of the brain and surrounding structures were obtained according to standard protocol without intravenous contrast.  Comparison: MRI 06/21/2010  Findings: Negative for acute infarct.  No significant chronic ischemic changes.  Negative for demyelinating disease.  Cerebral white matter is normal.  Basal  ganglia, brainstem, and cerebellum are normal.  Negative for intracranial hemorrhage or mass.  No edema is present. Vessels at the base of the brain are patent.  Paranasal sinuses show mild mucosal edema in the left maxillary sinus.  IMPRESSION: Normal MRI of the brain.  Mild chronic sinusitis.   Original Report Authenticated By: Camelia Phenes, M.D.     Microbiology: No results found for this or any previous visit (from the past 240 hour(s)).   Labs: Basic Metabolic Panel:  Lab 01/20/12 2841 01/19/12 1643  NA 135 136  K 3.8 4.5  CL 103 102  CO2 22 24  GLUCOSE 109* 114*  BUN 8 11  CREATININE 0.59 0.58  CALCIUM 8.5 9.2  MG -- --  PHOS -- --   Liver Function Tests:  Lab 01/20/12 0620 01/19/12 1643  AST 19 20  ALT 26 31  ALKPHOS 64 68  BILITOT 0.2* 0.2*  PROT 6.3 7.1  ALBUMIN 3.4* 3.7    Lab 01/19/12 1643  LIPASE 25  AMYLASE --   No results found for this basename: AMMONIA:5 in the last 168 hours CBC:  Lab 01/20/12 0620 01/19/12 1643  WBC 9.1 9.5  NEUTROABS 5.2 6.4  HGB 12.9 13.3  HCT 40.8 40.7  MCV 82.8 81.1  PLT 226 244   Cardiac Enzymes: No results found for this basename: CKTOTAL:5,CKMB:5,CKMBINDEX:5,TROPONINI:5 in the last 168 hours BNP: BNP (last 3 results) No results found for this basename: PROBNP:3 in the last 8760 hours CBG:  Lab 01/20/12 0641 01/20/12 0027  GLUCAP 104* 111*       Signed:  Penny Pia  [facility] 01/20/2012, 11:22 AM

## 2012-01-20 NOTE — Progress Notes (Signed)
NEURO HOSPITALIST PROGRESS NOTE   SUBJECTIVE:                                                                                                                         Patients [name] is stated to be a 8 1/2 out of 10.  She continues to have left arm and leg weakness and decreased sensation.    OBJECTIVE:                                                                                                                           Vital signs in last 24 hours: Temp:  [97.3 F (36.3 C)-99 F (37.2 C)] 97.8 F (36.6 C) (10/30 0600) Pulse Rate:  [77-114] 77  (10/30 0600) Resp:  [18-25] 20  (10/30 0600) BP: (79-122)/(40-78) 114/67 mmHg (10/30 0600) SpO2:  [97 %-100 %] 100 % (10/30 0600) Weight:  [115.3 kg (254 lb 3.1 oz)] 115.3 kg (254 lb 3.1 oz) (10/29 2300)  Intake/Output from previous day:   Intake/Output this shift:   Nutritional status:    Past Medical History  Diagnosis Date  . Depression   . Anxiety   . Diabetes mellitus   . GERD (gastroesophageal reflux disease)   . ADD (attention deficit disorder)   . Hyperlipemia   . Herniated disc   . Allergy     Seasonal  . Diabetic neuropathy   . Shortness of breath     with exertion   . Sleep apnea     obsructive settings at 13   . Heart murmur     asa child  . Seizures     hx of / month ago - dehydration   . Migraine     hemiplegic migraine versus TIA in 2010, lst migraine 7/16 13 in ER see EPIC    Neurologic ROS negative with exception of above left sided arm and leg weakness. Musculoskeletal ROS none  Neurologic Exam:  Mental Status: Alert, oriented, thought content appropriate.  Speech fluent without evidence of aphasia.  Able to follow 3 step commands without difficulty. Cranial Nerves: II: Discs flat bilaterally; Visual fields grossly normal, pupils equal, round, reactive to light and accommodation III,IV, VI: ptosis present left eye, extra-ocular motions intact bilaterally V,VII:  Smile symmetric when patient is not paying attention but when asked to smile she will pull right corner of her mouth but not her left. facial light touch sensation decreased on the left.  VIII: hearing normal bilaterally IX,X: gag reflex present XI: bilateral shoulder shrug--full strength with turning head to the right and shows no effort to turn head to the left.  XII: midline tongue extension Motor: Right : Upper extremity   5/5    Left:     Upper extremity   2/5 when asked but gives no effort.   Lower extremity   5/5     Lower extremity   2/5 when asked but gives no effort.  Tone and bulk:normal tone throughout; no atrophy noted Sensory: Decreased on the left arm and leg.  Patient states she can only feel slight pressure but also will withdraw to noxious stimuli to both finer and toe nailbed pressure.  Deep Tendon Reflexes: 2+ and symmetric throughout Plantars: Right: downgoing   Left: downgoing Cerebellar: Right F-N and right H-S normal CV: pulses palpable throughout     Lab Results: Lab Results  Component Value Date/Time   CHOL 153 01/20/2012  6:20 AM   Lipid Panel  Basename 01/20/12 0620  CHOL 153  TRIG 199*  HDL 28*  CHOLHDL 5.5  VLDL 40  LDLCALC 85    Studies/Results: Ct Head Wo Contrast  01/19/2012  *RADIOLOGY REPORT*  Clinical Data: Headache, left hemiplegia  CT HEAD WITHOUT CONTRAST  Technique:  Contiguous axial images were obtained from the base of the skull through the vertex without contrast.  Comparison: 07/27/2011  Findings: Mild frontal atrophy. There is no evidence of acute intracranial hemorrhage, brain edema, mass lesion, acute infarction,   mass effect, or midline shift. Acute infarct may be inapparent on noncontrast CT.  No other intra-axial abnormalities are seen, and the ventricles and sulci are within normal limits in size and symmetry.   No abnormal extra-axial fluid collections or masses are identified.  No significant calvarial abnormality.   IMPRESSION: 1. Negative for bleed or other acute intracranial process.   Original Report Authenticated By: Osa Craver, M.D.    Mri Brain Without Contrast  01/20/2012  *RADIOLOGY REPORT*  Clinical Data: Headache.  Left hemiplegia  MRI HEAD WITHOUT CONTRAST  Technique:  Multiplanar, multiecho pulse sequences of the brain and surrounding structures were obtained according to standard protocol without intravenous contrast.  Comparison: MRI 06/21/2010  Findings: Negative for acute infarct.  No significant chronic ischemic changes.  Negative for demyelinating disease.  Cerebral white matter is normal.  Basal ganglia, brainstem, and cerebellum are normal.  Negative for intracranial hemorrhage or mass.  No edema is present. Vessels at the base of the brain are patent.  Paranasal sinuses show mild mucosal edema in the left maxillary sinus.  IMPRESSION: Normal MRI of the brain.  Mild chronic sinusitis.   Original Report Authenticated By: Camelia Phenes, M.D.     MEDICATIONS  Scheduled:   . sodium chloride   Intravenous Once  .  HYDROmorphone (DILAUDID) injection  1 mg Intravenous Once  . insulin aspart  0-9 Units Subcutaneous TID WC  . insulin glargine  16 Units Subcutaneous QHS  . promethazine  25 mg Intravenous Once  . sodium chloride  1,000 mL Intravenous Once  . topiramate  100 mg Oral BID  . verapamil  120 mg Oral BID  . DISCONTD: promethazine  25 mg Oral Once  . DISCONTD: topiramate  100 mg Oral BID    ASSESSMENT/PLAN:                                                                                                               Patient Active Hospital Problem List: Hemiplegia affecting left nondominant side (07/28/2011)   Assessment: Continues to have effort dependent left sided weakness. MRI negative for acute infarct.    Plan: Continue PT/OT  History of migraine  (01/19/2012)   Assessment: Continues to have 8/10 [name].     Plan: Continue to treat [name] symptomatically.  Would avoid Narcotics if possible. As per previous chart note 07/27/11, Patient had seen Dr. Catalina Lunger in [facility] as out patient for [name] . During consultation by phone he had described narcotic seeking behavior.  May consider trial of Depakote 500 mg IV over 15 minutes X1.   Neurology will S/O    Felicie Morn PA-C [facility] [phone]  01/20/2012, 10:18 AM    Patient with functional left sided weakness. She splits the midline and has inconsistent weakness. We will sign off at this time. Could consider psychiatry consult, though the benefit of an inpatient evaluation is not clear.   I have seen and evaluated the patient. I have reviewed the above note and agree with the findings.   Ritta Slot, MD [facility] [phone]  If 7pm- 7am, please page neurology on call at [phone].

## 2012-01-26 ENCOUNTER — Encounter: Payer: BC Managed Care – PPO | Attending: General Surgery | Admitting: *Deleted

## 2012-01-26 ENCOUNTER — Encounter: Payer: Self-pay | Admitting: *Deleted

## 2012-01-26 DIAGNOSIS — Z01818 Encounter for other preprocedural examination: Secondary | ICD-10-CM | POA: Insufficient documentation

## 2012-01-26 DIAGNOSIS — Z713 Dietary counseling and surveillance: Secondary | ICD-10-CM | POA: Insufficient documentation

## 2012-01-26 NOTE — Progress Notes (Addendum)
Follow-up visit:  8 Weeks Post-Operative RYGB Surgery  Medical Nutrition Therapy:  Appt start time: 1500 end time:  1545.  Primary concerns today: Post-operative Bariatric Surgery Nutrition Management. [name] returns today with 20 lb weight loss.  Reports trip to ED recently with suspected TIA. Doing well otherwise; no difficulty chewing or swallowing. A1c down to 6.3% (01/20/12).   Surgery date: 11/30/11 Surgery type: RYGB Start weight @ [facility]: 290.5 lbs  Weight today: 249.5 lbs Weight change: 20.0 lbs Total weight lost: 41.0 lbs BMI: 39.1 kg/m^2  Weight loss goal: 175 lbs % goal met: 35%  TANITA BODY COMP RESULTS   09/04/11  12/15/11  12/29/11 01/26/12  %Fat  54.0%  50.0%  52.2% 49.7%  Fat Mass (lbs)  157.0  135.0  133.5 124.0  Fat Free Mass (lbs)  133.5  134.5  122.5 125.5  Total Body Water (lbs)  97.5  98.5  89.5 92.0   Fluid intake:  64+ oz Estimated total protein intake:  Pro bars (20g x 2), chicken (2 oz) = 60-70g    Medications: See medication list. Not currently taking Albuterol, Bepreve, or Nizoral Supplementation: Taking as directed  CBG monitoring: Daily Average CBG per patient: 80-110 mg/dL  Lab Results  Component Value Date   HGBA1C 6.3* 01/20/2012   Using straws: No Drinking while eating: Usually no Hair loss: Mild Carbonated beverages: No N/V/D/C: Constipation Dumping syndrome: None  Recent physical activity:  Some walking; not much d/t migraines, possible recent TIA  Progress Towards Goal(s):  In progress.  Handouts given during visit include:  Phase 3B: High Protein + Non-Starchy Vegetables   Nutritional Diagnosis:  [place]-3.3 Overweight/obesity As related to recent RYGB surgery.  As evidenced by patient attempting to follow post-op nutritional guidelines for continued weight loss.    Intervention:  Nutrition education/diet advancement.  Monitoring/Evaluation:  Dietary intake, exercise, and body weight. Follow up in 1 months for 3 month post-op  visit.

## 2012-01-26 NOTE — Patient Instructions (Addendum)
Goals:  Follow Phase 3B: High Protein + Non-Starchy Vegetables  Eat 3-6 small meals/snacks, every 3-5 hrs  Increase lean protein foods to meet 60-80g goal  Increase fluid intake to 64oz +  Avoid drinking 15 minutes before, during and 30 minutes after eating  Aim for >30 min of physical activity daily 

## 2012-02-01 ENCOUNTER — Telehealth (INDEPENDENT_AMBULATORY_CARE_PROVIDER_SITE_OTHER): Payer: Self-pay

## 2012-02-01 NOTE — Telephone Encounter (Signed)
Medical Release form for [facility] faxed to : [phone]

## 2012-02-16 ENCOUNTER — Emergency Department (HOSPITAL_COMMUNITY): Payer: BC Managed Care – PPO

## 2012-02-16 ENCOUNTER — Emergency Department (HOSPITAL_COMMUNITY)
Admission: EM | Admit: 2012-02-16 | Discharge: 2012-02-17 | Disposition: A | Discharge status: Home / Self Care | Payer: BC Managed Care – PPO | Attending: Emergency Medicine | Admitting: Emergency Medicine

## 2012-02-16 ENCOUNTER — Encounter (HOSPITAL_COMMUNITY): Payer: Self-pay | Admitting: *Deleted

## 2012-02-16 DIAGNOSIS — Z794 Long term (current) use of insulin: Secondary | ICD-10-CM | POA: Insufficient documentation

## 2012-02-16 DIAGNOSIS — E1142 Type 2 diabetes mellitus with diabetic polyneuropathy: Secondary | ICD-10-CM | POA: Diagnosis not present

## 2012-02-16 DIAGNOSIS — F3289 Other specified depressive episodes: Secondary | ICD-10-CM | POA: Diagnosis not present

## 2012-02-16 DIAGNOSIS — Z8679 Personal history of other diseases of the circulatory system: Secondary | ICD-10-CM | POA: Insufficient documentation

## 2012-02-16 DIAGNOSIS — G43909 Migraine, unspecified, not intractable, without status migrainosus: Secondary | ICD-10-CM | POA: Diagnosis not present

## 2012-02-16 DIAGNOSIS — Z79899 Other long term (current) drug therapy: Secondary | ICD-10-CM | POA: Diagnosis not present

## 2012-02-16 DIAGNOSIS — F411 Generalized anxiety disorder: Secondary | ICD-10-CM | POA: Diagnosis not present

## 2012-02-16 DIAGNOSIS — G4733 Obstructive sleep apnea (adult) (pediatric): Secondary | ICD-10-CM | POA: Insufficient documentation

## 2012-02-16 DIAGNOSIS — F329 Major depressive disorder, single episode, unspecified: Secondary | ICD-10-CM | POA: Diagnosis not present

## 2012-02-16 DIAGNOSIS — E785 Hyperlipidemia, unspecified: Secondary | ICD-10-CM | POA: Diagnosis not present

## 2012-02-16 DIAGNOSIS — K219 Gastro-esophageal reflux disease without esophagitis: Secondary | ICD-10-CM | POA: Insufficient documentation

## 2012-02-16 DIAGNOSIS — Z8739 Personal history of other diseases of the musculoskeletal system and connective tissue: Secondary | ICD-10-CM | POA: Diagnosis not present

## 2012-02-16 DIAGNOSIS — E1149 Type 2 diabetes mellitus with other diabetic neurological complication: Secondary | ICD-10-CM | POA: Diagnosis not present

## 2012-02-16 DIAGNOSIS — F988 Other specified behavioral and emotional disorders with onset usually occurring in childhood and adolescence: Secondary | ICD-10-CM | POA: Diagnosis not present

## 2012-02-16 LAB — CBC WITH DIFFERENTIAL/PLATELET
Basophils Relative: 1 % (ref 0–1)
Hemoglobin: 12.7 g/dL (ref 12.0–15.0)
Lymphs Abs: 3.8 10*3/uL (ref 0.7–4.0)
Monocytes Relative: 6 % (ref 3–12)
Neutro Abs: 6.9 10*3/uL (ref 1.7–7.7)
Neutrophils Relative %: 60 % (ref 43–77)
Platelets: 290 10*3/uL (ref 150–400)
RBC: 4.85 MIL/uL (ref 3.87–5.11)
WBC: 11.5 10*3/uL — ABNORMAL HIGH (ref 4.0–10.5)

## 2012-02-16 LAB — BASIC METABOLIC PANEL
BUN: 11 mg/dL (ref 6–23)
Chloride: 99 mEq/L (ref 96–112)
GFR calc Af Amer: >90 mL/min (ref >90)
GFR calc non Af Amer: >90 mL/min (ref >90)
Glucose, Bld: 75 mg/dL (ref 70–99)
Potassium: 3.7 mEq/L (ref 3.5–5.1)
Sodium: 138 mEq/L (ref 135–145)

## 2012-02-16 MED ORDER — VALPROATE SODIUM 500 MG/5ML IV SOLN
500.0000 mg | Freq: Once | INTRAVENOUS | Status: DC
  Filled 2012-02-16: qty 5

## 2012-02-16 MED ORDER — DEXTROSE 5 % IV SOLN
500.0000 mg | Freq: Once | INTRAVENOUS | Status: AC
  Administered 2012-02-16: 500 mg via INTRAVENOUS
  Filled 2012-02-16: qty 5

## 2012-02-16 MED ORDER — PROMETHAZINE HCL 25 MG/ML IJ SOLN
25.0000 mg | Freq: Once | INTRAMUSCULAR | Status: AC
  Administered 2012-02-16: 25 mg via INTRAVENOUS
  Filled 2012-02-16 (×2): qty 1

## 2012-02-16 NOTE — ED Notes (Signed)
While in restroom with patient, patient used left hand to prop herself up when she wiped herself. When patient got back into bed she used left foot to push herself up in the bed.

## 2012-02-16 NOTE — ED Notes (Signed)
IV team at bedside 

## 2012-02-16 NOTE — ED Notes (Signed)
Pt present with c/o migraine starting today.  Pt has hx migraines.  Pt husband at bedside.  Pt aaox3.  Pt left side flaccid.  Pt smile asymmetrical.  Pt speech is slurred.  Pt reports n/v.  Pt denies photosensitivity.  Pt husband reports, "my wife has a history of hemiplegic migraines that are very rare, with some of her migraines, she gets paralyzed on her left side that may last for days.  She usually gets whatever the doctor order in the emergency room and it makes her feels better and resolves her symptoms.  We try to monitor her triggers and between the weather and relocating, I think is a lot for her."

## 2012-02-16 NOTE — ED Provider Notes (Signed)
History     CSN: [id number]  Arrival date & time 02/16/12  1814   First MD Initiated Contact with Patient 02/16/12 1823      Chief Complaint  Patient presents with  . Migraine    (Consider location/radiation/quality/duration/timing/severity/associated sxs/prior treatment) HPI Comments: [name] is a 36 y.o. female who had onset of headache and left-sided weakness about one hour prior to coming here. She's had this frequently in the past. She has numerous allergies to medications to treat migraines. On her last hospital admission it was stated that she should not get narcotics for her headaches, at the request of her neurologist. No prodrome. For this headache. She did try a perphenazine pill without relief. She did not have fever, chills, nausea, vomiting, weakness, or dizziness prior to the incident, today. There are no other modifying factors. She came for evaluation by EMS. She requested transport to [facility] long ED, because the last time. [facility] "unnecessary evaluation for stroke".  Patient is a 35 y.o. female presenting with migraines. The history is provided by the patient and the spouse.  Migraine    Past Medical History  Diagnosis Date  . Depression   . Anxiety   . Diabetes mellitus   . GERD (gastroesophageal reflux disease)   . ADD (attention deficit disorder)   . Hyperlipemia   . Herniated disc   . Allergy     Seasonal  . Diabetic neuropathy   . Shortness of breath     with exertion   . Sleep apnea     obsructive settings at 13   . Heart murmur     asa child  . Seizures     hx of / month ago - dehydration   . 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 Cholecystectomy   . Cesarean section   . Tonsillectomy   . Laporoscopy 2007  . Breath tek h pylori 09/11/2011    Procedure: BREATH TEK H PYLORI;  Surgeon: Mariella Saa, MD;  Location:  [facility];  Service: General;  Laterality: N/A;  . 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 Bowel resection 11/30/2011    Procedure: SMALL BOWEL RESECTION;  Surgeon: Mariella Saa, MD;  Location: [facility];  Service: General;;    Family History  Problem Relation Age of Onset  . Diabetes type II    . Diabetes Mother   . Hypertension Mother   . Depression Brother   . Cancer Maternal Aunt     ovarian   . Depression Maternal Aunt   . Diabetes Maternal Aunt   . Cancer Maternal Grandmother     Lung  . COPD Maternal Grandmother   . Cancer Maternal Grandfather     Prostate  . Diabetes Maternal Grandfather   . Hearing loss Maternal Grandfather   . Hypertension Maternal Grandfather   . Stroke Maternal Grandfather   . Cancer Paternal Grandmother     Colon    History  Substance Use Topics  . Smoking status: Never Smoker   . Smokeless tobacco: Never Used  . Alcohol Use: No    OB History    Grav Para Term Preterm Abortions TAB SAB Ect Mult Living  Review of Systems  All other systems reviewed and are negative.    Allergies  Atarax; Cephalosporins; Codeine; Compazine; Decadron; Demerol; Metoclopramide hcl; Morphine and related; Penicillins; Sulfa antibiotics; Triptans; Zofran; Pork-derived products; and Shellfish-derived products  Home Medications   Current Outpatient Rx  Name  Route  Sig  Dispense  Refill  . ALBUTEROL SULFATE HFA 108 (90 BASE) MCG/ACT IN AERS   Inhalation   Inhale 2 puffs into the lungs every 6 (six) hours as needed. For shortness of breath.         . ALPRAZOLAM 1 MG PO TABS   Oral   Take 1 mg by mouth 3 (three) times daily as needed. For anxiety         . BEPOTASTINE BESILATE 1.5 % OP SOLN   Ophthalmic   Apply 1 drop to eye 3 (three) times daily as needed. Itching/allergies         .  CYCLOBENZAPRINE HCL 10 MG PO TABS   Oral   Take 10 mg by mouth 3 (three) times daily as needed. For headaches         . INSULIN GLARGINE 100 UNIT/ML [place] SOLN   Subcutaneous   Inject 16 Units into the skin at bedtime.         Marland Kitchen KETOCONAZOLE 2 % EX SHAM   Topical   Apply 1 application topically daily as needed. For scalp itching/pain.         Lynnda Shields XR PO   Oral   Take 60 mg by mouth daily.         . ADULT GUMMY PO CHEW   Oral   Chew 2 tablets by mouth daily.         Marland Kitchen PERPHENAZINE 4 MG PO TABS   Oral   Take 4 mg by mouth 3 (three) times daily as needed. Nausea/vomiting         . PROMETHAZINE HCL 25 MG/ML IJ SOLN   Intravenous   Inject 25 mg into the vein 4 (four) times daily as needed.         Marland Kitchen PROMETHAZINE HCL 6.25 MG/5ML PO SYRP   Oral   Take 25 mg by mouth 4 (four) times daily as needed. For nausea.         . TOPIRAMATE 25 MG PO CPSP   Oral   Take 100 mg by mouth 2 (two) times daily. Open capsules and mix in food/drink and consume all at once           BP 105/52  Pulse 91  Temp 98.5 F (36.9 C) (Oral)  Resp 23  SpO2 97%  LMP 01/18/2012  Physical Exam  Nursing note and vitals reviewed. Constitutional: She is oriented to person, place, and time. She appears well-developed and well-nourished.  HENT:  Head: Normocephalic and atraumatic.  Eyes: Conjunctivae normal and EOM are normal. Pupils are equal, round, and reactive to light.  Neck: Normal range of motion and phonation normal. Neck supple.  Cardiovascular: Normal rate, regular rhythm and intact distal pulses.   Pulmonary/Chest: Effort normal and breath sounds normal. No respiratory distress. She exhibits no tenderness.  Abdominal: Soft. She exhibits no distension. There is no tenderness. There is no guarding.  Musculoskeletal: Normal range of motion.  Neurological: She is alert and oriented to person, place, and time. She has normal strength. She exhibits normal muscle tone.        Peculiar dysarthria; characterized by a slow, drawn out, and slurred speech pattern. Left  hemiparesis, excluding the face.  Skin: Skin is warm and dry.  Psychiatric: Judgment and thought content normal.       She appears depressed    ED Course  Procedures (including critical care time)  First department treatment: IV, valproate, and Phenergan  Reevaluation: 21:00- she states no relief from the headache or weakness.  A repeat dose of valproate was ordered. She refused it.  Patient stated that she had to urinate. She was assisted to the bathroom in a wheelchair. She then started up on her right leg, and manipulate herself onto the commode. She then urinated, and stood up to wipe herself. She supported her self with her left hand as she did this.    Reevaluation:00:55- patient feels better, and would like to go home. Her speech has almost normalized.  Nursing notes, applicable records and vitals reviewed.  Radiologic Images/Reports reviewed.   Labs Reviewed  CBC WITH DIFFERENTIAL - Abnormal; Notable for the following:    WBC 11.5 (*)     All other components within normal limits  BASIC METABOLIC PANEL   Ct Head Wo Contrast  02/16/2012  *RADIOLOGY REPORT*  Clinical Data: Weakness.  CT HEAD WITHOUT CONTRAST  Technique:  Contiguous axial images were obtained from the base of the skull through the vertex without contrast.  Comparison: Head CT 01/19/2012.  Findings: No acute intracranial abnormalities.  Specifically, no definite signs of acute/subacute cerebral ischemia, no evidence of acute intracerebral hemorrhage, no mass, mass effect, hydrocephalus or abnormal intra or extra-axial fluid collections.  Visualized paranasal sinuses and mastoids are well pneumatized.  IMPRESSION: 1.  No acute intracranial abnormalities. 2.  Appearance of the brain is normal.   Original Report Authenticated By: Trudie Reed, M.D.      1. Migraine       MDM  Migraine headache with left hemiparesis.  Patient improved, with observation and treatment, Depakote. She frequently requested, Dilaudid, but this was not given. She continues to show elements of drug-seeking behavior.    Plan: Home Medications- usuale Treatments- restcommended follow up- PCP and Neurology prn          Flint Melter, MD 02/17/12 [id number]

## 2012-02-16 NOTE — ED Notes (Signed)
Pt observed slightly bending left arm and being able to bear weight on left leg while transferring the pt from the wheelchair to the bed.  During the transfer, pt was able to slightly pivot and turn onto bed.

## 2012-02-16 NOTE — ED Notes (Signed)
XBJ:[id number]<NF> Expected date:<BR> Expected time:<BR> Means of arrival:Ambulance<BR> Comments:<BR> EMS- 36yof migraine/left sided weakness

## 2012-02-16 NOTE — ED Notes (Signed)
IV re-paged to reassess pt for IV start.

## 2012-02-16 NOTE — ED Notes (Signed)
Pt present via ems with c/o hemipalgeic migraine.  Pt reports unilateral paralysis to left side due to migraine.  Pt repots, dr Catalina Lunger, pt neurologists requsted pt to be seen at [facility] ED.  Pt reported to EMS, that migraine symptoms mimic a stroke.  Pt reports its not a stroke.  Pt hx of hemipalegic migraines and was seen at [facility] ED.  Pt family reported to EMS that she was treated for a stroke at [facility] unnecessarily and thats why they requsted [employer].  Pt is aaox3.  Per EMS, pt having difficulty speaking, pt was unable to ambulate. EMS reports left side flaccid.  Pt vitals 128/76 HR 88, NSR, resp 18, unlabored.  cbg 88.

## 2012-02-16 NOTE — ED Notes (Signed)
Pt taken to the restroom via wheelchair. Pt states she is still unable to move the left side of her body. However when she was assisted to the toilet, I noticed the pt moving her left arm from hanging position to on top of her thigh.

## 2012-02-16 NOTE — ED Notes (Signed)
Attempted Iv, unsuccessful.  I v team notified.

## 2012-02-16 NOTE — ED Notes (Signed)
IV Team at bedside.  Iv team staes "I cant find anything, I will notify the next shift and see if they can come attempt."

## 2012-02-16 NOTE — ED Notes (Signed)
EDP notified of pt refusal to take second dose of Depacon

## 2012-02-17 NOTE — ED Notes (Signed)
Pt expressing desire to go home despite pt having difficulty ambulating.  Dr. Effie Shy made aware.  Pt made aware pt can leave AMA.  Pt states husband will be able to come and pick pt up.

## 2012-02-23 ENCOUNTER — Ambulatory Visit: Payer: BC Managed Care – PPO | Admitting: *Deleted

## 2012-02-23 ENCOUNTER — Other Ambulatory Visit: Payer: Self-pay | Admitting: Dermatology

## 2012-02-23 DIAGNOSIS — D485 Neoplasm of uncertain behavior of skin: Secondary | ICD-10-CM | POA: Diagnosis not present

## 2012-02-23 DIAGNOSIS — L650 Telogen effluvium: Secondary | ICD-10-CM | POA: Diagnosis not present

## 2012-02-23 DIAGNOSIS — L219 Seborrheic dermatitis, unspecified: Secondary | ICD-10-CM | POA: Diagnosis not present

## 2012-02-23 DIAGNOSIS — D239 Other benign neoplasm of skin, unspecified: Secondary | ICD-10-CM | POA: Diagnosis not present

## 2012-02-25 ENCOUNTER — Encounter: Payer: Self-pay | Admitting: *Deleted

## 2012-02-25 ENCOUNTER — Encounter: Payer: BC Managed Care – PPO | Attending: General Surgery | Admitting: *Deleted

## 2012-02-25 DIAGNOSIS — Z713 Dietary counseling and surveillance: Secondary | ICD-10-CM | POA: Insufficient documentation

## 2012-02-25 DIAGNOSIS — Z01818 Encounter for other preprocedural examination: Secondary | ICD-10-CM | POA: Insufficient documentation

## 2012-02-25 NOTE — Patient Instructions (Addendum)
Goals:  Follow Phase 3B: High Protein + Non-Starchy Vegetables  Increase lean protein foods to meet 60-80g goal  Increase fluid intake to 64oz +  Add 15 grams of carbohydrate (fruit, whole grain, starchy vegetable) with meals  Avoid drinking 15 minutes before, during and 30 minutes after eating  Aim for >30 min of physical activity daily

## 2012-02-25 NOTE — Progress Notes (Signed)
Follow-up visit:  12 Weeks Post-Operative RYGB Surgery  Medical Nutrition Therapy:  Appt start time: 1000 end time:  1030.  Primary concerns today: Post-operative Bariatric Surgery Nutrition Management. Doing very well with no problems.  Continues to have migraines; mainly with weather changes. Total weight loss (since assessment) of 55 lbs. Total loss of 45 lbs of FAT MASS!!  Surgery date: 11/30/11 Surgery type: RYGB Start weight @ [facility]: 290.5 lbs (09/04/11)  Weight today: 235.5 lbs Weight change: 14.0 lbs Total weight lost: 55.0 lbs BMI: 36.9 kg/m^2  Weight loss goal: 175 lbs % goal met: 48%  TANITA BODY COMP RESULTS   09/04/11  12/15/11  12/29/11 01/26/12 02/25/12  Fat Mass (lbs)  157.0  135.0  133.5 124.0 112.0  Fat Free Mass (lbs)  133.5  134.5  122.5 125.5 123.5  Total Body Water (lbs)  97.5  98.5  89.5 92.0 90.5   Fluid intake:  64+ oz Estimated total protein intake:  [employer] pro bars (20g x 2-3), chicken (2 oz) = 60-80g    Medications: See medication list.  Supplementation: Taking as directed  CBG monitoring: No longer checking Average CBG per patient: "normal" per pt  Lab Results  Component Value Date   HGBA1C 6.3* 01/20/2012   Using straws: No Drinking while eating: Usually not Hair loss: Moderate Carbonated beverages: No N/V/D/C: Vomiting at Thanksgiving - ate too much Dumping syndrome: None  Recent physical activity:  BELT program  Progress Towards Goal(s):  In progress.   Nutritional Diagnosis:  [place]-3.3 Overweight/obesity As related to recent RYGB surgery.  As evidenced by patient attempting to follow post-op nutritional guidelines for continued weight loss.    Intervention:  Nutrition education/diet advancement.  Monitoring/Evaluation:  Dietary intake, exercise, and body weight. Follow up in 6 weeks for 4-5 month post-op visit.

## 2012-03-04 ENCOUNTER — Ambulatory Visit (INDEPENDENT_AMBULATORY_CARE_PROVIDER_SITE_OTHER): Payer: BC Managed Care – PPO | Admitting: General Surgery

## 2012-03-04 ENCOUNTER — Encounter (INDEPENDENT_AMBULATORY_CARE_PROVIDER_SITE_OTHER): Payer: Self-pay | Admitting: General Surgery

## 2012-03-04 VITALS — BP 116/76 | HR 101 | Temp 97.4°F | Resp 16 | Ht 67.0 in | Wt 227.4 lb

## 2012-03-04 DIAGNOSIS — Z9884 Bariatric surgery status: Secondary | ICD-10-CM

## 2012-03-04 DIAGNOSIS — R110 Nausea: Secondary | ICD-10-CM | POA: Diagnosis not present

## 2012-03-04 DIAGNOSIS — K912 Postsurgical malabsorption, not elsewhere classified: Secondary | ICD-10-CM

## 2012-03-04 LAB — IRON AND TIBC
%SAT: 12 % — ABNORMAL LOW (ref 20–55)
Iron: 42 ug/dL (ref 42–145)

## 2012-03-04 LAB — CBC WITH DIFFERENTIAL/PLATELET
Basophils Relative: 1 % (ref 0–1)
HCT: 42.7 % (ref 36.0–46.0)
Hemoglobin: 14.3 g/dL (ref 12.0–15.0)
Lymphs Abs: 3.6 10*3/uL (ref 0.7–4.0)
MCHC: 33.5 g/dL (ref 30.0–36.0)
Monocytes Absolute: 0.6 10*3/uL (ref 0.1–1.0)
Monocytes Relative: 6 % (ref 3–12)
Neutro Abs: 6.3 10*3/uL (ref 1.7–7.7)
Neutrophils Relative %: 58 % (ref 43–77)
RBC: 5.51 MIL/uL — ABNORMAL HIGH (ref 3.87–5.11)

## 2012-03-04 LAB — COMPREHENSIVE METABOLIC PANEL
Albumin: 4.8 g/dL (ref 3.5–5.2)
BUN: 12 mg/dL (ref 6–23)
CO2: 26 mEq/L (ref 19–32)
Calcium: 9.6 mg/dL (ref 8.4–10.5)
Chloride: 104 mEq/L (ref 96–112)
Glucose, Bld: 87 mg/dL (ref 70–99)
Potassium: 3.8 mEq/L (ref 3.5–5.3)
Sodium: 139 mEq/L (ref 135–145)
Total Protein: 7.3 g/dL (ref 6.0–8.3)

## 2012-03-04 LAB — HEMOGLOBIN A1C
Hgb A1c MFr Bld: 6.3 % — ABNORMAL HIGH (ref <5.7)
Mean Plasma Glucose: 134 mg/dL — ABNORMAL HIGH (ref <117)

## 2012-03-04 LAB — LIPID PANEL
Cholesterol: 160 mg/dL (ref 0–200)
HDL: 35 mg/dL — ABNORMAL LOW (ref >39)
LDL Cholesterol: 91 mg/dL (ref 0–99)
Triglycerides: 172 mg/dL — ABNORMAL HIGH (ref <150)

## 2012-03-04 NOTE — Progress Notes (Signed)
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 She had been doing very well until last night. She states that overall she has been tolerating her high-protein diet well without GI complaints. She has had low hair loss. She however when after dinner last night and this included beets which tasted very sweet to her and she developed nausea and his left upper quadrant abdominal pain after eating.this has continued for the past 24 hours. She has been nauseated without vomiting. She gave herself with Phenergan injection today which he keeps due for migraine headaches. The pain has been significant in the left upper quadrant but not markedly severe. She has been recently doing a lot of moving and lifting.  Past Medical History  Diagnosis Date  . Depression   . Anxiety   . Diabetes mellitus   . GERD (gastroesophageal reflux disease)   . ADD (attention deficit disorder)   . Hyperlipemia   . Herniated disc   . Allergy     Seasonal  . Diabetic neuropathy   . Shortness of breath     with exertion   . Sleep apnea     obsructive settings at 13   . Heart murmur     asa child  . Seizures     hx of / month ago - dehydration   . 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 Cholecystectomy   . Cesarean section   . Tonsillectomy   . Laporoscopy 2007  . Breath tek h pylori 09/11/2011    Procedure: BREATH TEK H PYLORI;  Surgeon: Mariella Saa, MD;  Location: [facility];  Service: General;  Laterality: N/A;  . 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 Bowel resection 11/30/2011    Procedure: SMALL BOWEL RESECTION;  Surgeon: Mariella Saa, MD;  Location: [facility];  Service: General;;   Current  Outpatient Prescriptions  Medication Sig Dispense Refill  . albuterol (PROVENTIL HFA;VENTOLIN HFA) 108 (90 BASE) MCG/ACT inhaler Inhale 2 puffs into the lungs every 6 (six) hours as needed. For shortness of breath.      . ALPRAZolam (XANAX) 1 MG tablet Take 1 mg by mouth 3 (three) times daily as needed. For anxiety      . Bepotastine Besilate (BEPREVE) 1.5 % SOLN Apply 1 drop to eye 3 (three) times daily as needed. Itching/allergies      . cyclobenzaprine (FLEXERIL) 10 MG tablet Take 10 mg by mouth 3 (three) times daily as needed. For headaches      . ketoconazole (NIZORAL) 2 % shampoo Apply 1 application topically daily as needed. For scalp itching/pain.      . Methylphenidate HCl (QUILLIVANT XR PO) Take 60 mg by mouth daily.      . Multiple Vitamins-Minerals (ADULT GUMMY) CHEW Chew 2 tablets by mouth daily.      Marland Kitchen perphenazine (TRILAFON) 4 MG tablet Take 8 mg by mouth 3 (three) times daily as needed. Nausea/vomiting      . promethazine (PHENERGAN) 25 MG/ML injection Inject 25 mg into the vein 4 (four) times daily as needed.      . promethazine (PHENERGAN) 6.25 MG/5ML syrup Take 25 mg by mouth 4 (four) times daily as needed. For nausea.      Marland Kitchen  topiramate (TOPAMAX) 25 MG capsule Take 100 mg by mouth 2 (two) times daily. Open capsules and mix in food/drink and consume all at once      Exam: BP 116/76  Pulse 101  Temp 97.4 F (36.3 C) (Temporal)  Resp 16  Ht 5\' 7"  (1.702 m)  Wt 227 lb 6.4 oz (103.148 kg)  BMI 35.62 kg/m2  LMP 01/18/2012 Total weight loss 59 pounds, 21 pounds since last visit General: Does not appear acutely ill Skin: Dry for infection Lungs: Clear equal breath sounds Abdomen: Active bowel sounds. Nondistended. There is no discernible tenderness or guarding. No masses or organomegaly.  Assessment and plan: Overall doing very well following gastric bypass with remission of diabetes. She however has 24 hours of abdominal pain and nausea after dinner last night. Her abdomen is  benign on exam. I recommend at this point we check lab work. She will limit her diet to clear liquids over the weekend. I gave her a small prescription for Roxicet that she has Phenergan at home. She will check with [place] Monday we will review her lab work and see how her she is feeling at that time. She understands that she feels at all worse she will need to go to the [facility] emergency room over the weekend.

## 2012-03-04 NOTE — Patient Instructions (Signed)
Clear liquid diet only over the weekend.  Call us Monday to let us know how you're feeling.  Ifyou start to feel worse over the weekend with increasing pain or vomiting you will need to go to the [facility] emergency room.

## 2012-03-05 LAB — VITAMIN B12: Vitamin B-12: >2000 pg/mL — ABNORMAL HIGH (ref 211–911)

## 2012-03-05 LAB — T4: T4, Total: 9.3 ug/dL (ref 5.0–12.5)

## 2012-03-07 ENCOUNTER — Telehealth (INDEPENDENT_AMBULATORY_CARE_PROVIDER_SITE_OTHER): Payer: Self-pay | Admitting: General Surgery

## 2012-03-07 ENCOUNTER — Encounter (INDEPENDENT_AMBULATORY_CARE_PROVIDER_SITE_OTHER): Payer: Self-pay

## 2012-03-07 NOTE — Telephone Encounter (Signed)
Pt called to report on progress to Dr. Johna Sheriff and check on labs. I placed her on hold to speak with Christy. Line dropped after 2 minutes. I gave phone #s to [facility] to contact pt/gy

## 2012-03-08 ENCOUNTER — Other Ambulatory Visit (INDEPENDENT_AMBULATORY_CARE_PROVIDER_SITE_OTHER): Payer: Self-pay

## 2012-03-08 ENCOUNTER — Encounter (HOSPITAL_COMMUNITY): Payer: Self-pay | Admitting: Emergency Medicine

## 2012-03-08 ENCOUNTER — Emergency Department (HOSPITAL_COMMUNITY): Payer: BC Managed Care – PPO

## 2012-03-08 ENCOUNTER — Telehealth (INDEPENDENT_AMBULATORY_CARE_PROVIDER_SITE_OTHER): Payer: Self-pay

## 2012-03-08 ENCOUNTER — Emergency Department (HOSPITAL_COMMUNITY)
Admission: EM | Admit: 2012-03-08 | Discharge: 2012-03-08 | Disposition: A | Discharge status: Home / Self Care | Payer: BC Managed Care – PPO | Attending: Emergency Medicine | Admitting: Emergency Medicine

## 2012-03-08 DIAGNOSIS — R110 Nausea: Secondary | ICD-10-CM | POA: Diagnosis not present

## 2012-03-08 DIAGNOSIS — R319 Hematuria, unspecified: Secondary | ICD-10-CM

## 2012-03-08 DIAGNOSIS — Z8709 Personal history of other diseases of the respiratory system: Secondary | ICD-10-CM | POA: Insufficient documentation

## 2012-03-08 DIAGNOSIS — E1149 Type 2 diabetes mellitus with other diabetic neurological complication: Secondary | ICD-10-CM | POA: Diagnosis not present

## 2012-03-08 DIAGNOSIS — Z8669 Personal history of other diseases of the nervous system and sense organs: Secondary | ICD-10-CM | POA: Insufficient documentation

## 2012-03-08 DIAGNOSIS — Z8719 Personal history of other diseases of the digestive system: Secondary | ICD-10-CM | POA: Diagnosis not present

## 2012-03-08 DIAGNOSIS — R011 Cardiac murmur, unspecified: Secondary | ICD-10-CM | POA: Diagnosis not present

## 2012-03-08 DIAGNOSIS — Z79899 Other long term (current) drug therapy: Secondary | ICD-10-CM | POA: Insufficient documentation

## 2012-03-08 DIAGNOSIS — R109 Unspecified abdominal pain: Secondary | ICD-10-CM

## 2012-03-08 DIAGNOSIS — R1012 Left upper quadrant pain: Secondary | ICD-10-CM | POA: Insufficient documentation

## 2012-03-08 DIAGNOSIS — E785 Hyperlipidemia, unspecified: Secondary | ICD-10-CM | POA: Insufficient documentation

## 2012-03-08 DIAGNOSIS — G40909 Epilepsy, unspecified, not intractable, without status epilepticus: Secondary | ICD-10-CM | POA: Diagnosis not present

## 2012-03-08 DIAGNOSIS — Z3202 Encounter for pregnancy test, result negative: Secondary | ICD-10-CM | POA: Diagnosis not present

## 2012-03-08 DIAGNOSIS — N200 Calculus of kidney: Secondary | ICD-10-CM | POA: Insufficient documentation

## 2012-03-08 DIAGNOSIS — Z8659 Personal history of other mental and behavioral disorders: Secondary | ICD-10-CM | POA: Diagnosis not present

## 2012-03-08 DIAGNOSIS — E1142 Type 2 diabetes mellitus with diabetic polyneuropathy: Secondary | ICD-10-CM | POA: Insufficient documentation

## 2012-03-08 DIAGNOSIS — Z9884 Bariatric surgery status: Secondary | ICD-10-CM

## 2012-03-08 DIAGNOSIS — Z8739 Personal history of other diseases of the musculoskeletal system and connective tissue: Secondary | ICD-10-CM | POA: Diagnosis not present

## 2012-03-08 DIAGNOSIS — Z8679 Personal history of other diseases of the circulatory system: Secondary | ICD-10-CM | POA: Diagnosis not present

## 2012-03-08 LAB — BASIC METABOLIC PANEL
BUN: 7 mg/dL (ref 6–23)
Chloride: 100 mEq/L (ref 96–112)
Glucose, Bld: 113 mg/dL — ABNORMAL HIGH (ref 70–99)
Potassium: 3.9 mEq/L (ref 3.5–5.1)

## 2012-03-08 LAB — CBC WITH DIFFERENTIAL/PLATELET
Basophils Relative: 0 % (ref 0–1)
Eosinophils Relative: 2 % (ref 0–5)
HCT: 39.8 % (ref 36.0–46.0)
Hemoglobin: 12.8 g/dL (ref 12.0–15.0)
Lymphocytes Relative: 30 % (ref 12–46)
MCHC: 32.2 g/dL (ref 30.0–36.0)
MCV: 80.1 fL (ref 78.0–100.0)
Monocytes Absolute: 0.5 10*3/uL (ref 0.1–1.0)
Monocytes Relative: 6 % (ref 3–12)
Neutro Abs: 5.4 10*3/uL (ref 1.7–7.7)

## 2012-03-08 LAB — URINALYSIS, ROUTINE W REFLEX MICROSCOPIC
Bilirubin Urine: NEGATIVE
Specific Gravity, Urine: 1.016 (ref 1.005–1.030)
pH: 6 (ref 5.0–8.0)

## 2012-03-08 LAB — LIPASE, BLOOD: Lipase: 22 U/L (ref 11–59)

## 2012-03-08 LAB — URINE MICROSCOPIC-ADD ON

## 2012-03-08 MED ORDER — SUCRALFATE 1 GM/10ML PO SUSP: 1.0000 g | Freq: Four times a day (QID) | ORAL | Status: DC | PRN

## 2012-03-08 MED ORDER — FAMOTIDINE 20 MG PO TABS
20.0000 mg | ORAL_TABLET | Freq: Once | ORAL | Status: AC
  Administered 2012-03-08: 20 mg via ORAL
  Filled 2012-03-08: qty 1

## 2012-03-08 MED ORDER — SODIUM CHLORIDE 0.9 % IV SOLN: 1000.0000 mL | Freq: Once | INTRAVENOUS | Status: DC

## 2012-03-08 MED ORDER — GI COCKTAIL ~~LOC~~
30.0000 mL | Freq: Once | ORAL | Status: AC
  Administered 2012-03-08: 30 mL via ORAL
  Filled 2012-03-08: qty 30

## 2012-03-08 MED ORDER — PROMETHAZINE HCL 25 MG/ML IJ SOLN
25.0000 mg | Freq: Once | INTRAMUSCULAR | Status: AC
  Administered 2012-03-08: 25 mg via INTRAMUSCULAR
  Filled 2012-03-08: qty 1

## 2012-03-08 MED ORDER — FAMOTIDINE 20 MG PO TABS: 20.0000 mg | ORAL_TABLET | Freq: Two times a day (BID) | ORAL | Status: DC

## 2012-03-08 MED ORDER — HYDROMORPHONE HCL PF 1 MG/ML IJ SOLN
0.5000 mg | Freq: Once | INTRAMUSCULAR | Status: DC
  Filled 2012-03-08: qty 1

## 2012-03-08 MED ORDER — FAMOTIDINE IN NACL 20-0.9 MG/50ML-% IV SOLN: 20.0000 mg | Freq: Once | INTRAVENOUS | Status: DC

## 2012-03-08 MED ORDER — ONDANSETRON HCL 4 MG/2ML IJ SOLN: 4.0000 mg | Freq: Once | INTRAMUSCULAR | Status: DC

## 2012-03-08 MED ORDER — PROMETHAZINE HCL 25 MG/ML IJ SOLN
25.0000 mg | Freq: Four times a day (QID) | INTRAMUSCULAR | Status: DC | PRN
  Filled 2012-03-08: qty 1

## 2012-03-08 MED ORDER — HYDROMORPHONE HCL PF 1 MG/ML IJ SOLN
0.5000 mg | Freq: Once | INTRAMUSCULAR | Status: AC
  Administered 2012-03-08: 0.5 mg via INTRAMUSCULAR

## 2012-03-08 NOTE — Telephone Encounter (Signed)
Reviewed labs with Dr. Johna Sheriff.  Rec'd verbal order for CT abd.  Called [facility] to schedule CT for patient, while speaking with scheduling noticed patient was currently admitted.  Place [facility] on hold and called patient on mobile phone, patient reports she is currently at Emergency Room.  Patient ask if Dr. Johna Sheriff would order pain medication for her, patient made aware that the ER Physician will be the ordering Physician for medications if felt the patient need's pain medication.  Since patient was registering at the ED, I did not continue with scheduling her CT ABD.

## 2012-03-08 NOTE — ED Notes (Signed)
When pt saw that her temp was 98.5, she stated "That's a fever for me".

## 2012-03-08 NOTE — Telephone Encounter (Signed)
Patient calling in to report an increase in RUQ pain, patient states the Roxicet not helping with pain.  Patient lab results from 03/04/12 are available, will page Dr. Johna Sheriff to review.

## 2012-03-08 NOTE — ED Notes (Signed)
See triage note. Pt reports gastric bipass surgery 3 months ago. Has had constant upper left abdominal pain 9/10 since Friday. Normal bowel movement. Reports nausea and vomiting x2 today. Denies dysuria or blood in stool.

## 2012-03-08 NOTE — ED Notes (Signed)
Pt c/o lt sided flank pain since Friday.  States that she is 3 months post op from gastric bypass.  Normal BMs.  Denies dysuria.

## 2012-03-08 NOTE — ED Notes (Signed)
rn unsuccessful IV attempt

## 2012-03-08 NOTE — ED Provider Notes (Signed)
History     CSN: [id number]  Arrival date & time 03/08/12  1657   First MD Initiated Contact with Patient 03/08/12 1834      Chief Complaint  Patient presents with  . Abdominal Pain  . gastric bypass surgery 3 months ago     HPI   The patient presents with new left upper quadrant, with severity.  Symptoms began approximately 3 days ago.  Since onset symptoms have been worsening.  The pain is nonradiating, sharp, worse with palpation.  There is concurrent nausea.  No vomiting, no diarrhea.  No dysuria.  The patient does complain of chills.  She is anorexic. Notably, the patient's history of gastric bypass, approximately 3 months ago.   Past Medical History  Diagnosis Date  . Depression   . Anxiety   . Diabetes mellitus   . GERD (gastroesophageal reflux disease)   . ADD (attention deficit disorder)   . Hyperlipemia   . Herniated disc   . Allergy     Seasonal  . Diabetic neuropathy   . Shortness of breath     with exertion   . Sleep apnea     obsructive settings at 13   . Heart murmur     asa child  . Seizures     hx of / month ago - dehydration   . 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 Cholecystectomy   . Cesarean section   . Tonsillectomy   . Laporoscopy 2007  . Breath tek h pylori 09/11/2011    Procedure: BREATH TEK H PYLORI;  Surgeon: Mariella Saa, MD;  Location: [facility];  Service: General;  Laterality: N/A;  . 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 Bowel resection 11/30/2011    Procedure: SMALL BOWEL RESECTION;  Surgeon: Mariella Saa, MD;  Location: [facility];  Service: General;;    Family History  Problem Relation Age of Onset  . Diabetes type II    . Diabetes Mother   . Hypertension Mother   . Depression Brother   .  Cancer Maternal Aunt     ovarian   . Depression Maternal Aunt   . Diabetes Maternal Aunt   . Cancer Maternal Grandmother     Lung  . COPD Maternal Grandmother   . Cancer Maternal Grandfather     Prostate  . Diabetes Maternal Grandfather   . Hearing loss Maternal Grandfather   . Hypertension Maternal Grandfather   . Stroke Maternal Grandfather   . Cancer Paternal Grandmother     Colon    History  Substance Use Topics  . Smoking status: Never Smoker   . Smokeless tobacco: Never Used  . Alcohol Use: No    OB History    Grav Para Term Preterm Abortions TAB SAB Ect Mult Living                  Review of Systems  Constitutional:       Per HPI, otherwise negative  HENT:       Per HPI, otherwise negative  Eyes: Negative.   Respiratory:       Per HPI, otherwise negative  Cardiovascular:       Per HPI, otherwise negative  Gastrointestinal: Negative for vomiting.  Genitourinary: Negative.   Musculoskeletal:       Per HPI, otherwise negative  Skin: Negative.   Neurological: Negative for syncope.    Allergies  Atarax; Cephalosporins; Codeine; Compazine; Decadron; Demerol; Metoclopramide hcl; Morphine and related; Penicillins; Sulfa antibiotics; Triptans; Zofran; Pork-derived products; and Shellfish-derived products  Home Medications   Current Outpatient Rx  Name  Route  Sig  Dispense  Refill  . ALBUTEROL SULFATE HFA 108 (90 BASE) MCG/ACT IN AERS   Inhalation   Inhale 2 puffs into the lungs every 6 (six) hours as needed. For shortness of breath.         . ALPRAZOLAM 1 MG PO TABS   Oral   Take 1 mg by mouth 4 (four) times daily as needed. For anxiety         . BEPOTASTINE BESILATE 1.5 % OP SOLN   Ophthalmic   Apply 1 drop to eye 3 (three) times daily as needed. Itching/allergies         . CYCLOBENZAPRINE HCL 10 MG PO TABS   Oral   Take 10 mg by mouth 3 (three) times daily as needed. For headaches         . KETOCONAZOLE 2 % EX SHAM   Topical   Apply 1  application topically daily as needed. For scalp itching/pain.         . ADULT GUMMY PO CHEW   Oral   Chew 2 tablets by mouth daily.         Marland Kitchen PERPHENAZINE 4 MG PO TABS   Oral   Take 8 mg by mouth 3 (three) times daily as needed. Nausea/vomiting         . PROMETHAZINE HCL 25 MG/ML IJ SOLN   Intravenous   Inject 25 mg into the vein 4 (four) times daily as needed.         Marland Kitchen PROMETHAZINE HCL 6.25 MG/5ML PO SYRP   Oral   Take 25 mg by mouth 4 (four) times daily as needed. For nausea.         . TOPIRAMATE 25 MG PO CPSP   Oral   Take 100 mg by mouth 2 (two) times daily. Open capsules and mix in food/drink and consume all at once           BP 108/66  Pulse 103  Temp 98.5 F (36.9 C) (Oral)  Resp 18  SpO2 98%  LMP 01/18/2012  Physical Exam  Nursing note and vitals reviewed. Constitutional: She is oriented to person, place, and time. She appears well-developed and well-nourished. No distress.  HENT:  Head: Normocephalic and atraumatic.  Eyes: Conjunctivae normal and EOM are normal.  Cardiovascular: Regular rhythm.  Tachycardia present.   Pulmonary/Chest: Effort normal and breath sounds normal. No stridor. No respiratory distress.  Abdominal: She exhibits no distension. There is tenderness in the left upper quadrant. There is no rigidity, no rebound and no guarding.  Musculoskeletal: She exhibits no edema.  Neurological: She is alert and oriented to person, place, and time. No cranial nerve deficit.  Skin: Skin is warm and dry.  Psychiatric: She has a normal mood and affect.    ED Course  Procedures (including critical care time)  Labs Reviewed  CBC WITH DIFFERENTIAL - Abnormal; Notable for the following:    MCH 25.8 (*)     All other components within normal limits  URINALYSIS, ROUTINE W REFLEX MICROSCOPIC - Abnormal; Notable for the following:    APPearance CLOUDY (*)  Hgb urine dipstick LARGE (*)     Ketones, ur TRACE (*)     Leukocytes, UA LARGE (*)      All other components within normal limits  BASIC METABOLIC PANEL - Abnormal; Notable for the following:    Glucose, Bld 113 (*)     All other components within normal limits  URINE MICROSCOPIC-ADD ON - Abnormal; Notable for the following:    Squamous Epithelial / LPF FEW (*)     Bacteria, UA MANY (*)     All other components within normal limits  POCT PREGNANCY, URINE  URINE CULTURE  LIPASE, BLOOD   No results found.   No diagnosis found.  Pulse ox 99% room air normal  9:25 PM Patient feels largely better.  Vital signs remained stable. MDM  This patient with a recent history of gastric bypass now presents with left upper quadrant pain.  On exam she is in no distress.  Patient is mildly tachycardic, but otherwise has unremarkable vital signs.  Following analgesics she was improved.  With the description of left upper quadrant pain, her hematuria, nephrolithiasis was a consideration, in addition to the postsurgical concerns of infection versus obstruction.  The patient's CT scan was unremarkable.  Given the persistence of her discomfort she started on any H2 blocker, provided GI cocktail, analgesics.  As a distress, fever, leukocytosis she is appropriate for discharge with follow up with her surgeon, which was advised        Gerhard Munch, MD 03/08/12 2127

## 2012-03-08 NOTE — ED Notes (Addendum)
md at bedside  Pt alert and oriented x4. Respirations even and unlabored, bilateral symmetrical rise and fall of chest. Skin warm and dry. In no acute distress. Denies needs.   

## 2012-03-09 LAB — VITAMIN B1: Vitamin B1 (Thiamine): 17 nmol/L (ref 8–30)

## 2012-03-10 LAB — URINE CULTURE

## 2012-03-10 NOTE — Telephone Encounter (Signed)
Pt was seen in ED for severe LUQ pain.  CT scan was normal.  Pt was sent home with Rx for Carafate, which the pt states is not relieving her pain.

## 2012-03-11 ENCOUNTER — Inpatient Hospital Stay (HOSPITAL_COMMUNITY)
Admission: AD | Admit: 2012-03-11 | Discharge: 2012-03-15 | DRG: 176 | Disposition: A | Discharge status: Home / Self Care | Payer: BC Managed Care – PPO | Source: Ambulatory Visit | Attending: General Surgery | Admitting: General Surgery

## 2012-03-11 ENCOUNTER — Encounter (INDEPENDENT_AMBULATORY_CARE_PROVIDER_SITE_OTHER): Payer: Self-pay | Admitting: General Surgery

## 2012-03-11 ENCOUNTER — Ambulatory Visit (INDEPENDENT_AMBULATORY_CARE_PROVIDER_SITE_OTHER): Payer: BC Managed Care – PPO | Admitting: General Surgery

## 2012-03-11 ENCOUNTER — Encounter (HOSPITAL_COMMUNITY): Payer: Self-pay | Admitting: Surgery

## 2012-03-11 VITALS — BP 118/78 | HR 84 | Temp 99.0°F | Resp 20 | Ht 67.0 in | Wt 231.0 lb

## 2012-03-11 DIAGNOSIS — R1012 Left upper quadrant pain: Secondary | ICD-10-CM

## 2012-03-11 DIAGNOSIS — F988 Other specified behavioral and emotional disorders with onset usually occurring in childhood and adolescence: Secondary | ICD-10-CM | POA: Diagnosis present

## 2012-03-11 DIAGNOSIS — Z880 Allergy status to penicillin: Secondary | ICD-10-CM

## 2012-03-11 DIAGNOSIS — F411 Generalized anxiety disorder: Secondary | ICD-10-CM | POA: Diagnosis present

## 2012-03-11 DIAGNOSIS — E785 Hyperlipidemia, unspecified: Secondary | ICD-10-CM | POA: Diagnosis present

## 2012-03-11 DIAGNOSIS — Z9884 Bariatric surgery status: Secondary | ICD-10-CM

## 2012-03-11 DIAGNOSIS — K219 Gastro-esophageal reflux disease without esophagitis: Secondary | ICD-10-CM | POA: Diagnosis present

## 2012-03-11 DIAGNOSIS — E1142 Type 2 diabetes mellitus with diabetic polyneuropathy: Secondary | ICD-10-CM | POA: Diagnosis present

## 2012-03-11 DIAGNOSIS — Z885 Allergy status to narcotic agent status: Secondary | ICD-10-CM

## 2012-03-11 DIAGNOSIS — F329 Major depressive disorder, single episode, unspecified: Secondary | ICD-10-CM | POA: Diagnosis present

## 2012-03-11 DIAGNOSIS — E1149 Type 2 diabetes mellitus with other diabetic neurological complication: Secondary | ICD-10-CM | POA: Diagnosis present

## 2012-03-11 DIAGNOSIS — Z882 Allergy status to sulfonamides status: Secondary | ICD-10-CM

## 2012-03-11 DIAGNOSIS — G4733 Obstructive sleep apnea (adult) (pediatric): Secondary | ICD-10-CM | POA: Diagnosis present

## 2012-03-11 DIAGNOSIS — Z881 Allergy status to other antibiotic agents status: Secondary | ICD-10-CM

## 2012-03-11 DIAGNOSIS — F3289 Other specified depressive episodes: Secondary | ICD-10-CM | POA: Diagnosis present

## 2012-03-11 DIAGNOSIS — K289 Gastrojejunal ulcer, unspecified as acute or chronic, without hemorrhage or perforation: Principal | ICD-10-CM | POA: Diagnosis present

## 2012-03-11 DIAGNOSIS — E119 Type 2 diabetes mellitus without complications: Secondary | ICD-10-CM | POA: Diagnosis present

## 2012-03-11 DIAGNOSIS — R627 Adult failure to thrive: Secondary | ICD-10-CM | POA: Diagnosis present

## 2012-03-11 LAB — GLUCOSE, CAPILLARY: Glucose-Capillary: 92 mg/dL (ref 70–99)

## 2012-03-11 MED ORDER — SODIUM CHLORIDE 0.9 % IJ SOLN
10.0000 mL | INTRAMUSCULAR | Status: DC | PRN
  Administered 2012-03-12 – 2012-03-14 (×3): 10 mL

## 2012-03-11 MED ORDER — POTASSIUM CHLORIDE IN NACL 20-0.45 MEQ/L-% IV SOLN
INTRAVENOUS | Status: DC
  Administered 2012-03-11 – 2012-03-15 (×10): via INTRAVENOUS
  Filled 2012-03-11 (×13): qty 1000

## 2012-03-11 MED ORDER — HYDROMORPHONE HCL PF 1 MG/ML IJ SOLN
1.0000 mg | INTRAMUSCULAR | Status: DC | PRN
  Administered 2012-03-11 (×2): 1 mg via INTRAVENOUS
  Administered 2012-03-12 (×2): 2 mg via INTRAVENOUS
  Administered 2012-03-12 (×2): 1 mg via INTRAVENOUS
  Administered 2012-03-12 – 2012-03-15 (×27): 2 mg via INTRAVENOUS
  Administered 2012-03-15: 1 mg via INTRAVENOUS
  Administered 2012-03-15: 2 mg via INTRAVENOUS
  Administered 2012-03-15: 1 mg via INTRAVENOUS
  Administered 2012-03-15: 2 mg via INTRAVENOUS
  Filled 2012-03-11: qty 1
  Filled 2012-03-11: qty 2
  Filled 2012-03-11: qty 1
  Filled 2012-03-11 (×4): qty 2
  Filled 2012-03-11 (×2): qty 1
  Filled 2012-03-11: qty 2
  Filled 2012-03-11: qty 1
  Filled 2012-03-11 (×3): qty 2
  Filled 2012-03-11: qty 1
  Filled 2012-03-11 (×9): qty 2
  Filled 2012-03-11: qty 1
  Filled 2012-03-11: qty 2
  Filled 2012-03-11: qty 1
  Filled 2012-03-11 (×6): qty 2
  Filled 2012-03-11: qty 1
  Filled 2012-03-11 (×4): qty 2
  Filled 2012-03-11: qty 1

## 2012-03-11 MED ORDER — HYDROMORPHONE HCL PF 1 MG/ML IJ SOLN
1.0000 mg | INTRAMUSCULAR | Status: DC | PRN
  Administered 2012-03-11: 2 mg via INTRAMUSCULAR
  Filled 2012-03-11 (×2): qty 2
  Filled 2012-03-11 (×2): qty 1

## 2012-03-11 MED ORDER — PANTOPRAZOLE SODIUM 40 MG IV SOLR
40.0000 mg | Freq: Two times a day (BID) | INTRAVENOUS | Status: DC
  Administered 2012-03-11 – 2012-03-14 (×7): 40 mg via INTRAVENOUS
  Filled 2012-03-11 (×9): qty 40

## 2012-03-11 MED ORDER — ENOXAPARIN SODIUM 40 MG/0.4ML ~~LOC~~ SOLN
40.0000 mg | SUBCUTANEOUS | Status: DC
  Administered 2012-03-12 – 2012-03-14 (×3): 40 mg via SUBCUTANEOUS
  Filled 2012-03-11 (×5): qty 0.4

## 2012-03-11 MED ORDER — PROMETHAZINE HCL 25 MG/ML IJ SOLN
12.5000 mg | Freq: Four times a day (QID) | INTRAMUSCULAR | Status: DC | PRN
  Administered 2012-03-12 (×3): 12.5 mg via INTRAVENOUS
  Administered 2012-03-12: 15 mg via INTRAVENOUS
  Administered 2012-03-13 – 2012-03-15 (×9): 12.5 mg via INTRAVENOUS
  Filled 2012-03-11 (×15): qty 1

## 2012-03-11 MED ORDER — PROMETHAZINE HCL 25 MG/ML IJ SOLN
12.5000 mg | Freq: Four times a day (QID) | INTRAMUSCULAR | Status: DC | PRN
  Administered 2012-03-11: 12.5 mg via INTRAMUSCULAR
  Filled 2012-03-11: qty 1

## 2012-03-11 MED ORDER — INSULIN ASPART 100 UNIT/ML ~~LOC~~ SOLN: 0.0000 [IU] | SUBCUTANEOUS | Status: DC

## 2012-03-11 NOTE — Progress Notes (Signed)
Peripherally Inserted Central Catheter/Midline Placement  The IV Nurse has discussed with the patient and/or persons authorized to consent for the patient, the purpose of this procedure and the potential benefits and risks involved with this procedure.  The benefits include less needle sticks, lab draws from the catheter and patient may be discharged home with the catheter.  Risks include, but not limited to, infection, bleeding, blood clot (thrombus formation), and puncture of an artery; nerve damage and irregular heat beat.  Alternatives to this procedure were also discussed.  PICC/Midline Placement Documentation  PICC / Midline Single Lumen 03/11/12 PICC Right Basilic (Active)  Indication for Insertion or Continuance of Line Poor Vasculature-patient has had multiple peripheral attempts or PIVs lasting less than 24 hours 03/11/2012  8:33 PM  Length mark (cm) 4 cm 03/11/2012  8:33 PM  Site Assessment Clean;Dry;Intact 03/11/2012  8:33 PM  Line Status Flushed;Saline locked;Capped (central line) 03/11/2012  8:33 PM  Dressing Type Transparent 03/11/2012  8:33 PM  Dressing Status Clean;Intact;Dry 03/11/2012  9:09 PM  Dressing Change Due 03/18/12 03/11/2012  8:33 PM       Joana Reamer, Berton Lan 03/11/2012, 9:22 PM

## 2012-03-11 NOTE — Progress Notes (Signed)
Pt was admitted to hospital - pls see h&p note.

## 2012-03-11 NOTE — Patient Instructions (Signed)
Go to [facility] for admission  

## 2012-03-11 NOTE — Progress Notes (Signed)
Dr. Andrey Campanile notified regarding unsuccessful attempts with obtaining ordered blood work and iv starts.  New orders received and to be initiated.  Francene Finders, RN

## 2012-03-11 NOTE — H&P (Signed)
[name] is an 36 y.o. female.   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 Johna Sheriff comes in to the urgent office today complaining of persistent left upper quadrant pain.she last saw Dr. Johna Sheriff on December 13. At that time she mentioned that she developed acute onset of left upper quadrant pain after dinner the night before that appointment. She did not appear toxic at that time. She was noted to have some mild tenderness on exam and her vital signs are stable. Labs were checked which were normal. The patient presented to the emergency department on the 17th complaining of persistent pain constant left upper quadrant pain. She underwent laboratory evaluation as well as a CT scan of her abdomen and pelvis. Her labs were normal. Her CT scan was unremarkable. She was given IV fluids, GI cocktail, anti-reflux medication and discharged home. She comes in today complaining of the same pain which has been persistent. She is now having difficulty tolerating liquids. She states that any oral intake makes her nauseous. She tried to eat some eggs this morning and vomited them up. She states that she had something very cold to drink a few hours ago and it caused the pain to increase. It does not radiate. Nothing has really alleviated the pain. She describes it as an 8/10. She really hasn't been able to eat or drink much over the past several days. She denies any melena or hematochezia. She denies any hematemesis. She denies any fevers or chills. She denies any NSAID use.  Past Medical History  Diagnosis Date  . Depression   . Anxiety   . Diabetes mellitus   . GERD (gastroesophageal reflux disease)   . ADD (attention deficit disorder)   . Hyperlipemia   . Herniated disc   . Allergy     Seasonal  . Diabetic neuropathy   . Shortness of breath     with exertion   . Sleep apnea     obsructive settings at 13     . Heart murmur     asa child  . Seizures     hx of / month ago - dehydration   . 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 Cholecystectomy   . Cesarean section   . Tonsillectomy   . Laporoscopy 2007  . Breath tek h pylori 09/11/2011    Procedure: BREATH TEK H PYLORI;  Surgeon: Mariella Saa, MD;  Location: [facility];  Service: General;  Laterality: N/A;  . 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 Bowel resection 11/30/2011    Procedure: SMALL BOWEL RESECTION;  Surgeon: Mariella Saa, MD;  Location: [facility];  Service: General;;    Family History  Problem Relation Age of Onset  . Diabetes type II    . Diabetes Mother   . Hypertension Mother   . Depression Brother   . Cancer Maternal Aunt     ovarian   . Depression Maternal Aunt   . Diabetes Maternal Aunt   . Cancer Maternal Grandmother     Lung  . COPD Maternal Grandmother   . Cancer Maternal Grandfather     Prostate  . Diabetes Maternal Grandfather   .  Hearing loss Maternal Grandfather   . Hypertension Maternal Grandfather   . Stroke Maternal Grandfather   . Cancer Paternal Grandmother     Colon   Social History:  reports that she has never smoked. She has never used smokeless tobacco. She reports that she does not drink alcohol or use illicit drugs.  Allergies:  Allergies  Allergen Reactions  . Atarax (Hydroxyzine Hcl) Anaphylaxis  . Cephalosporins Anaphylaxis  . Codeine Anaphylaxis    Tolerates Dilaudid and Percocet.  . Compazine Anaphylaxis    Tolerates Phenergan.  . Decadron (Dexamethasone) Anaphylaxis  . Demerol Anaphylaxis  . Metoclopramide Hcl Anaphylaxis  . Morphine And Related Anaphylaxis    Tolerates Dilaudid and Percocet.  Marland Kitchen Penicillins Anaphylaxis  . Sulfa Antibiotics  Anaphylaxis  . Triptans Anaphylaxis  . Zofran Anaphylaxis  . Pork-Derived Products Other (See Comments)    Religous reasons--per pt ok to take Heparin when medically necessary  . Shellfish-Derived Products Other (See Comments)    Religous reasons    Medications Prior to Admission  Medication Sig Dispense Refill  . albuterol (PROVENTIL HFA;VENTOLIN HFA) 108 (90 BASE) MCG/ACT inhaler Inhale 2 puffs into the lungs every 6 (six) hours as needed. For shortness of breath.      . ALPRAZolam (XANAX) 1 MG tablet Take 1 mg by mouth 4 (four) times daily as needed. For anxiety      . Bepotastine Besilate (BEPREVE) 1.5 % SOLN Apply 1 drop to eye 3 (three) times daily as needed. Itching/allergies      . calcium-vitamin D (OSCAL WITH D) 500-200 MG-UNIT per tablet Take 1 tablet by mouth daily.      . cyclobenzaprine (FLEXERIL) 10 MG tablet Take 10 mg by mouth 3 (three) times daily as needed. For headaches      . famotidine (PEPCID) 20 MG tablet Take 1 tablet (20 mg total) by mouth 2 (two) times daily.  30 tablet  0  . ketoconazole (NIZORAL) 2 % shampoo Apply 1 application topically daily as needed. For scalp itching/pain.      . Multiple Vitamins-Minerals (ADULT GUMMY) CHEW Chew 2 tablets by mouth daily.      Marland Kitchen oxyCODONE-acetaminophen (ROXICET) 5-325 MG/5ML solution Take 5-10 mLs by mouth every 4 (four) hours as needed. For pain      . perphenazine (TRILAFON) 4 MG tablet Take 8 mg by mouth 3 (three) times daily as needed. Nausea/vomiting      . promethazine (PHENERGAN) 25 MG/ML injection Inject 25 mg into the vein 4 (four) times daily as needed.      . topiramate (TOPAMAX) 25 MG capsule Take 100 mg by mouth 2 (two) times daily. Open capsules and mix in food/drink and consume all at once        No results found for this or any previous visit (from the past 48 hour(s)). No results found.  Review of Systems  Constitutional: Positive for malaise/fatigue. Negative for fever and chills.  HENT: Negative for  hearing loss, nosebleeds, sore throat and neck pain.   Eyes: Negative for blurred vision and double vision.  Respiratory: Negative for shortness of breath and wheezing.   Cardiovascular: Negative for chest pain, orthopnea and leg swelling.  Gastrointestinal: Positive for nausea, vomiting and abdominal pain. Negative for heartburn, blood in stool and melena.  Genitourinary: Negative for dysuria, urgency and frequency.  Musculoskeletal: Negative for back pain.  Skin: Negative for rash.  Neurological: Positive for weakness. Negative for seizures and loss of consciousness.  Endo/Heme/Allergies: Does not bruise/bleed easily.  Psychiatric/Behavioral: Negative for suicidal ideas and hallucinations.    Blood pressure 123/78, pulse 86, temperature 97.8 F (36.6 C), temperature source Oral, resp. rate 18, last menstrual period 03/07/2012, SpO2 98.00%. Physical Exam  Vitals reviewed. Constitutional: She is oriented to person, place, and time. Vital signs are normal. She appears well-developed. She is cooperative. She has a sickly appearance. No distress.  HENT:  Head: Normocephalic and atraumatic.  Right Ear: External ear normal.  Eyes: Conjunctivae normal are normal. No scleral icterus.  Neck: Neck supple. No tracheal deviation present. No thyromegaly present.  Cardiovascular: Normal rate, regular rhythm and normal heart sounds.   Respiratory: Effort normal and breath sounds normal. No stridor. No respiratory distress. She has no wheezes.  GI: Soft. Bowel sounds are normal. She exhibits no distension. There is tenderness in the left upper quadrant. There is no rigidity, no rebound and no guarding.         multiple well healed trocar incisions  Musculoskeletal: She exhibits no edema and no tenderness.  Neurological: She is alert and oriented to person, place, and time.  Skin: Skin is warm and dry. No rash noted. There is pallor.  Psychiatric: She has a normal mood and affect. Her behavior is  normal. Judgment and thought content normal.     Assessment/Plan Status post laparoscopic Roux-en-Y gastric bypass Left upper quadrant pain Failure to thrive  I reviewed her CT scan and labs from the other evening. Her CT scan looked grossly normal to me. I discussed her case with Dr. Johna Sheriff. Based on her inability to keep liquids down I recommended admission for IV fluids. We'll repeat her labs what she has IV access. This is most consistent with a marginal ulcer. However there can be other factors at play. At this point with a normal CAT scan I believe the patient needs an upper endoscopy or an upper GI. There is no sign of obstruction. We will try to medically manage her and treat her empirically for an ulcer.  [name]. Andrey Campanile, MD, FACS General, Bariatric, & Minimally Invasive Surgery [facility], [place]   [facility] M 03/11/2012, 6:03 PM

## 2012-03-12 ENCOUNTER — Encounter: Payer: Self-pay | Admitting: *Deleted

## 2012-03-12 DIAGNOSIS — R1012 Left upper quadrant pain: Secondary | ICD-10-CM | POA: Diagnosis not present

## 2012-03-12 LAB — GLUCOSE, CAPILLARY
Glucose-Capillary: 81 mg/dL (ref 70–99)
Glucose-Capillary: 117 mg/dL — ABNORMAL HIGH (ref 70–99)
Glucose-Capillary: 111 mg/dL — ABNORMAL HIGH (ref 70–99)

## 2012-03-12 LAB — BASIC METABOLIC PANEL
BUN: 4 mg/dL — ABNORMAL LOW (ref 6–23)
CO2: 28 mEq/L (ref 19–32)
Chloride: 99 mEq/L (ref 96–112)
Glucose, Bld: 118 mg/dL — ABNORMAL HIGH (ref 70–99)
Potassium: 4.1 mEq/L (ref 3.5–5.1)
Sodium: 134 mEq/L — ABNORMAL LOW (ref 135–145)

## 2012-03-12 MED ORDER — MAGNESIUM HYDROXIDE 400 MG/5ML PO SUSP
5.0000 mL | Freq: Every day | ORAL | Status: DC | PRN
  Administered 2012-03-15: 5 mL via ORAL
  Filled 2012-03-12 (×2): qty 30

## 2012-03-12 MED ORDER — SUCRALFATE 1 GM/10ML PO SUSP
1.0000 g | Freq: Four times a day (QID) | ORAL | Status: DC
  Administered 2012-03-12 – 2012-03-15 (×10): 1 g via ORAL
  Filled 2012-03-12 (×19): qty 10

## 2012-03-12 MED ORDER — DEXTROSE 50 % IV SOLN: 25.0000 mL | Freq: Once | INTRAVENOUS | Status: AC | PRN

## 2012-03-12 MED ORDER — SUCRALFATE 1 GM/10ML PO SUSP: 1.0000 g | Freq: Three times a day (TID) | ORAL | Status: DC

## 2012-03-12 MED ORDER — DEXTROSE 50 % IV SOLN
INTRAVENOUS | Status: AC
  Administered 2012-03-12: 25 mL
  Filled 2012-03-12: qty 50

## 2012-03-12 NOTE — Progress Notes (Signed)
cpap setup for pt at 13cm h2o per home settings.  Pt has her tubing and full face mask from home.  Sterile water added to humidity chamber.

## 2012-03-12 NOTE — Progress Notes (Signed)
  Subjective: Still with LUQ pain. +flatus. Nausea after po. Had to have PICC inserted secondary to poor pIV access  Objective: Vital signs in last 24 hours: Temp:  [97.8 F (36.6 C)-99 F (37.2 C)] 98.1 F (36.7 C) (12/21 0610) Pulse Rate:  [82-93] 82  (12/21 0610) Resp:  [18-20] 18  (12/21 0610) BP: (115-123)/(69-81) 115/81 mmHg (12/21 0610) SpO2:  [95 %-99 %] 99 % (12/21 0610) Weight:  [229 lb 6.4 oz (104.055 kg)-231 lb (104.781 kg)] 229 lb 6.4 oz (104.055 kg) (12/20 1624) Last BM Date: 03/10/12  Intake/Output from previous day: 12/20 0701 - 12/21 0700 In: 1229.2 [I.V.:1229.2] Out: -  Intake/Output this shift:   Alert, nad cta b/l Reg Soft, nd,+LUQ TTP - no rt, no guarding -same as yesterday   Lab Results:  No results found for this basename: WBC:2,HGB:2,HCT:2,PLT:2 in the last 72 hours BMET  [facility] 03/12/12 0452  NA 134*  K 4.1  CL 99  CO2 28  GLUCOSE 118*  BUN 4*  CREATININE 0.50  CALCIUM 8.7    Anti-infectives: Anti-infectives    None      Assessment/Plan: s/p * No surgery found * S/p LRYGB 11/2011 LUQ pain - ?marginal ulcer Cont pain med Cont PPI Add carafate Tentative EGD first of the week CPAP for OSA Blood sugars ok VTE prophylaxis Cont nutritional labs in am  [name]. Andrey Campanile, MD, FACS General, Bariatric, & Minimally Invasive Surgery [facility], [place]    LOS: 1 day    Atilano Ina 03/12/2012

## 2012-03-13 DIAGNOSIS — R1012 Left upper quadrant pain: Secondary | ICD-10-CM | POA: Diagnosis not present

## 2012-03-13 DIAGNOSIS — Z9884 Bariatric surgery status: Secondary | ICD-10-CM

## 2012-03-13 LAB — GLUCOSE, CAPILLARY
Glucose-Capillary: 100 mg/dL — ABNORMAL HIGH (ref 70–99)
Glucose-Capillary: 98 mg/dL (ref 70–99)

## 2012-03-13 LAB — COMPREHENSIVE METABOLIC PANEL
ALT: 15 U/L (ref 0–35)
AST: 17 U/L (ref 0–37)
Albumin: 3.4 g/dL — ABNORMAL LOW (ref 3.5–5.2)
Calcium: 8.8 mg/dL (ref 8.4–10.5)
Sodium: 135 mEq/L (ref 135–145)
Total Protein: 6.2 g/dL (ref 6.0–8.3)

## 2012-03-13 NOTE — Progress Notes (Signed)
  Subjective: Didn't eat or drink much yesterday. When tried to drink broth - it made LUQ pain worse and increased nausea. +BM. Cut leg shaving  Objective: Vital signs in last 24 hours: Temp:  [97.7 F (36.5 C)-98.5 F (36.9 C)] 98.5 F (36.9 C) (12/22 0602) Pulse Rate:  [80-94] 87  (12/22 0602) Resp:  [16-20] 16  (12/22 0602) BP: (98-126)/(53-77) 98/64 mmHg (12/22 0602) SpO2:  [94 %-99 %] 94 % (12/22 0602) Last BM Date: 03/12/12  Intake/Output from previous day: 12/21 0701 - 12/22 0700 In: 3272.5 [P.O.:360; I.V.:2912.5] Out: 1 [Stool:1] Intake/Output this shift:    Alert, NAD cta ant  Reg Soft, still with same LUQ TTP. No RT/guarding No edema  Lab Results:   BMET  Basename 03/13/12 0510 03/12/12 0452  NA 135 134*  K 4.0 4.1  CL 99 99  CO2 28 28  GLUCOSE 92 118*  BUN 3* 4*  CREATININE 0.51 0.50  CALCIUM 8.8 8.7   A/P:  S/p LRYGB OSA DM - blood sugars excellent LUQ pain - ?etiology. CT the other day looked good. Presumed marginal ulcer. Cont carafate, protonix. Tentative upper endoscopy in AM. Npo p MN Ambulate  [name]. Andrey Campanile, MD, FACS General, Bariatric, & Minimally Invasive Surgery [facility], [place]   LOS: 2 days    [name] 03/13/2012

## 2012-03-13 NOTE — Progress Notes (Signed)
Spoke with patient about wearing CPAP tonight and patient states she will place on herself when she is ready. Encouraged to call if she has questions or needs any help. RT will continue to monitor.

## 2012-03-14 ENCOUNTER — Encounter (HOSPITAL_COMMUNITY): Admission: AD | Disposition: A | Discharge status: Home / Self Care | Payer: Self-pay | Source: Ambulatory Visit

## 2012-03-14 ENCOUNTER — Encounter (HOSPITAL_COMMUNITY): Payer: Self-pay | Admitting: *Deleted

## 2012-03-14 DIAGNOSIS — K289 Gastrojejunal ulcer, unspecified as acute or chronic, without hemorrhage or perforation: Secondary | ICD-10-CM

## 2012-03-14 HISTORY — PX: ESOPHAGOGASTRODUODENOSCOPY: SHX5428

## 2012-03-14 LAB — GLUCOSE, CAPILLARY
Glucose-Capillary: 101 mg/dL — ABNORMAL HIGH (ref 70–99)
Glucose-Capillary: 102 mg/dL — ABNORMAL HIGH (ref 70–99)
Glucose-Capillary: 77 mg/dL (ref 70–99)
Glucose-Capillary: 80 mg/dL (ref 70–99)
Glucose-Capillary: 83 mg/dL (ref 70–99)
Glucose-Capillary: 86 mg/dL (ref 70–99)
Glucose-Capillary: 98 mg/dL (ref 70–99)

## 2012-03-14 SURGERY — EGD (ESOPHAGOGASTRODUODENOSCOPY)
Anesthesia: Moderate Sedation

## 2012-03-14 MED ORDER — FENTANYL CITRATE 0.05 MG/ML IJ SOLN
INTRAMUSCULAR | Status: DC | PRN
  Administered 2012-03-14 (×3): 25 ug via INTRAVENOUS

## 2012-03-14 MED ORDER — MIDAZOLAM HCL 10 MG/2ML IJ SOLN
INTRAMUSCULAR | Status: AC
  Filled 2012-03-14: qty 2

## 2012-03-14 MED ORDER — FENTANYL CITRATE 0.05 MG/ML IJ SOLN
INTRAMUSCULAR | Status: AC
  Filled 2012-03-14: qty 2

## 2012-03-14 MED ORDER — MIDAZOLAM HCL 10 MG/2ML IJ SOLN
INTRAMUSCULAR | Status: DC | PRN
  Administered 2012-03-14: 2 mg via INTRAVENOUS
  Administered 2012-03-14: 1 mg via INTRAVENOUS
  Administered 2012-03-14: 2 mg via INTRAVENOUS

## 2012-03-14 MED ORDER — BUTAMBEN-TETRACAINE-BENZOCAINE 2-2-14 % EX AERO
INHALATION_SPRAY | CUTANEOUS | Status: DC | PRN
  Administered 2012-03-14: 2 via TOPICAL

## 2012-03-14 NOTE — Op Note (Signed)
03/11/2012 - 03/14/2012  4:01 PM  PATIENT:  [name], 36 y.o., female, MRN: [id number]  PREOP DIAGNOSIS:  Left upper quadrant pain  POSTOP DIAGNOSIS:   Marginal ulcer x 2 at GE junction  PROCEDURE:  Esophagogastrojejunoscopy  SURGEON:   Ovidio Kin, M.D.  ANESTHESIA:   Fentanyl  75 mcg   Versed 5 mg  INDICATIONS FOR PROCEDURE:  [name] is a 36 y.o. (DOB: 11-24-1975)  white female whose primary care physician is [facility], MD and comes for upper endoscopy to evaluate left upper quadrant abdominal pain.  The patient had a RYGB on 11/30/2011 by Dr. Leonard Schwartz. [place].   The indications and risks of the endoscopy were explained to the patient.  The risks include, but are not limited to, perforation, bleeding, or injury to the bowel.  If balloon dilatation is needed, the risk of perforation is higher.  PROCEDURE:  The patient was in room 4 at [facility] endoscopy unit.  The patient was monitored with a pulse oximetry, BP cuff, and EKG.  The patient has nasal O2 flowing during the procedure.   The back of the throat was anesthestized with Ceticaine x 3.  The patient was positioned in the left lateral decubitus position.  The patient was given Fentanyl and Versed.  A flexible Pentax endoscope was passed down the throat without difficulty.   Findings include:   Esophagus:   Normal   GE junction at:  40 cm   Stomach pouch: Normal   Gastrojejunal anastomosis:   45 cm.  She had two ulcers on opposite walls of the anastomosis ("kissing").  Each ulcer is about 1 cm.  The anastomosis opening was about 2.5 cm and widely patent.  Photos were taken.   Efferent jejunal limb:  Normal.   Afferent jejunal limb:  Normal   CLO test:  Pending (though is was slightly positive while in the endo suite)  PLAN:   Photos taken and given to patient.   She is admitted, on carafate and protonix.  She will spend the night and probably home tomorrow.  Ovidio Kin, MD, [facility] Pager:  [phone] Office phone:  [phone]

## 2012-03-14 NOTE — Progress Notes (Signed)
Patient ID: [name], female   DOB: Jan 25, 1976, 36 y.o.   MRN: [id number]    Subjective: Still with persistent LUQ pain, a little better since admission  Objective: Vital signs in last 24 hours: Temp:  [97.5 F (36.4 C)-98.3 F (36.8 C)] 98.3 F (36.8 C) (12/23 0610) Pulse Rate:  [79-84] 79  (12/23 0610) Resp:  [18-20] 18  (12/23 0610) BP: (115-126)/(54-74) 115/66 mmHg (12/23 0610) SpO2:  [98 %-99 %] 99 % (12/23 0610) Last BM Date: 03/12/12  Intake/Output from previous day: 12/22 0701 - 12/23 0700 In: 3215.8 [P.O.:720; I.V.:2495.8] Out: 2900 [Urine:2900] Intake/Output this shift: Total I/O In: 10 [I.V.:10] Out: -   General appearance: alert, cooperative and no distress GI: abnormal findings:  moderate tenderness in the LUQ  Lab Results:  No results found for this basename: WBC:2,HGB:2,HCT:2,PLT:2 in the last 72 hours BMET  [facility] 03/13/12 0510 03/12/12 0452  NA 135 134*  K 4.0 4.1  CL 99 99  CO2 28 28  GLUCOSE 92 118*  BUN 3* 4*  CREATININE 0.51 0.50  CALCIUM 8.8 8.7     Studies/Results: No results found.  Anti-infectives: Anti-infectives    None      Assessment/Plan: Persistent LUQ pain 3 months p Roux Y gastric bypass W/U so far (CT, lab) neg R/O marginal ulcer For EGD today     LOS: 3 days    Erdine Hulen T 03/14/2012

## 2012-03-14 NOTE — Progress Notes (Signed)
Pt will place self on CPAP when ready. RT will assist as needed.

## 2012-03-14 NOTE — Progress Notes (Signed)
Persistent LUQ pain post RYGB. See previous notes.  I discussed planned endo with patient.  The risks include, but are not limited to, perforation, bleeding, or injury to bowel.  I tried to answer the patient's questions.  Plan endo this afternoon.  Ovidio Kin, MD, [facility] Surgery Pager: [phone] Office phone:  [phone]

## 2012-03-15 ENCOUNTER — Encounter (HOSPITAL_COMMUNITY): Payer: Self-pay | Admitting: Surgery

## 2012-03-15 LAB — GLUCOSE, CAPILLARY: Glucose-Capillary: 116 mg/dL — ABNORMAL HIGH (ref 70–99)

## 2012-03-15 MED ORDER — PANTOPRAZOLE SODIUM 40 MG PO TBEC: 40.0000 mg | DELAYED_RELEASE_TABLET | Freq: Two times a day (BID) | ORAL | Status: DC

## 2012-03-15 MED ORDER — SUCRALFATE 1 GM/10ML PO SUSP: 1.0000 g | Freq: Four times a day (QID) | ORAL | Status: DC

## 2012-03-15 MED ORDER — OXYCODONE-ACETAMINOPHEN 5-325 MG/5ML PO SOLN: 5.0000 mL | ORAL | Status: DC | PRN

## 2012-03-15 NOTE — Progress Notes (Signed)
Order received from MD [facility] for picc line removal, order entered Means, Myrtie Hawk RN 03-15-2012 9:24am

## 2012-03-15 NOTE — Progress Notes (Signed)
Discharge instructions reviewed with patient, patient to follow up with MD within 3weeks to a month, patient tolerating diet with no complaints of nausea and vomiting, prescription given and questions and concerns answered Means, Myrtie Hawk RN 03-15-2012 10:49am

## 2012-03-15 NOTE — Discharge Summary (Signed)
Patient ID: SEIDY LABRECK [id number] 36 y.o. 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 She initially did very well without complication with good weight loss and improvement in her comorbidities. She now however presents with about 2 weeks of persistent left upper quadrant abdominal pain worse by eating. Outpatient workup included a negative CT scan and unremarkable lab work. She however had worsening pain and was admitted for further treatment and workup.   Hospital Course:  Initially she was admitted and treated with IV hydration and pain and nausea medication. Upper endoscopy was recommended to rule out marginal ulcer and was performed. This did show to 1 cm marginal ulcers. She was treated with protonix and Carafate slurry throughout her hospitalization with improvement of her abdominal pain. CLO test appeared faintly positive at the time of her endoscopy and is pending. At this point she is feeling somewhat better and stable, tolerating her diet and ready for discharge.  Consults: None  Significant Diagnostic Studies: EGD showing two 1 cm marginal ulcers  Treatments: IV fluids, proton pump inhibitors IV and Carafate  Disposition: Home  Patient Instructions:   [place], [place]  Home Medication Instructions WUJ:[id number]   Printed on:03/15/12 0934  Medication Information                    perphenazine (TRILAFON) 4 MG tablet Take 8 mg by mouth 3 (three) times daily as needed. Nausea/vomiting            cyclobenzaprine (FLEXERIL) 10 MG tablet Take 10 mg by mouth 3 (three) times daily as needed. For headaches           ketoconazole (NIZORAL) 2 % shampoo Apply 1 application topically daily as needed. For scalp itching/pain.           ALPRAZolam (XANAX) 1 MG tablet Take 1 mg by mouth 4 (four) times daily as needed. For anxiety           topiramate (TOPAMAX) 25 MG capsule Take 100 mg by mouth 2 (two) times daily. Open capsules and mix in food/drink and consume all at once           Multiple Vitamins-Minerals (ADULT GUMMY) CHEW Chew 2 tablets by mouth daily.           albuterol (PROVENTIL HFA;VENTOLIN HFA) 108 (90 BASE) MCG/ACT inhaler Inhale 2 puffs into the lungs every 6 (six) hours as needed. For shortness of breath.           Bepotastine Besilate (BEPREVE) 1.5 % SOLN Apply 1 drop to eye 3 (three) times daily as needed. Itching/allergies           promethazine (PHENERGAN) 25 MG/ML injection Inject 25 mg into the vein 4 (four) times daily as needed.           famotidine (PEPCID) 20 MG tablet Take 1 tablet (20 mg total) by mouth 2 (two) times daily.           calcium-vitamin D (OSCAL WITH D) 500-200 MG-UNIT per tablet Take 1 tablet by mouth daily.           oxyCODONE-acetaminophen (ROXICET) 5-325 MG/5ML  solution Take 5-10 mLs by mouth every 4 (four) hours as needed. For pain           pantoprazole (PROTONIX) 40 MG tablet Take 1 tablet (40 mg total) by mouth 2 times daily at 12 noon and 4 pm.           sucralfate (CARAFATE) 1 GM/10ML suspension Take 10 mLs (1 g total) by mouth every 6 (six) hours.             Activity: activity as tolerated Diet: ppost bypass, bland Wound Care: none needed  Follow-up:  With Dr. Johna Sheriff in 1 month.  Signed: Mariella Saa MD, FACS  03/15/2012, 9:34 AM

## 2012-03-16 NOTE — Progress Notes (Signed)
Took the following samples to [facility] in the hospital on 03/13/12:    VerioIQ Meter starter kit: 1 ea  Lot # [id number]  Exp: 08/14   VerioIQ strips: 1 box (10 strips)  Lot # [id number]  Exp: 04/14   Freedavite Multivitamin: 3 bottles (5 tabs/bottle)  Lot # [id number]  Exp: 01/17

## 2012-03-18 ENCOUNTER — Telehealth (INDEPENDENT_AMBULATORY_CARE_PROVIDER_SITE_OTHER): Payer: Self-pay | Admitting: General Surgery

## 2012-03-18 ENCOUNTER — Telehealth (INDEPENDENT_AMBULATORY_CARE_PROVIDER_SITE_OTHER): Payer: Self-pay

## 2012-03-18 NOTE — Telephone Encounter (Signed)
Spoke to patient regarding H-Pylori results (negative)  Patient reports daughter has flu, and concerned and would like to know if she should see her PCP for Tamiflu RX.  Patient need's to see PCP for further evaluation of the FLU.  Patient advised to continue taking Protonix BID, Carafate QID and Roxicet for LUQ Pain.  Patient will follow up in office on Friday 04/22/12 @ 1:30pm w/Dr. Johna Sheriff.

## 2012-03-18 NOTE — Telephone Encounter (Signed)
Pt is home now from recent hospitalization.  She called to report she is "not better at all."  Still having LUQ pain, nausea and diarrhea.  Reports she is taking Protonix BID, Carafate QID and Roxicet for pain.  She has not yet heard the results of the H pylori test done at the hospital, either.  Is there anything else to offer her?  Call her home number first, but may be reachable later on the cell phone.

## 2012-03-24 ENCOUNTER — Telehealth (INDEPENDENT_AMBULATORY_CARE_PROVIDER_SITE_OTHER): Payer: Self-pay | Admitting: General Surgery

## 2012-03-24 NOTE — Telephone Encounter (Signed)
Call the patient and left message regarding her recent concern

## 2012-03-28 ENCOUNTER — Encounter (INDEPENDENT_AMBULATORY_CARE_PROVIDER_SITE_OTHER): Payer: Self-pay

## 2012-03-28 NOTE — Progress Notes (Signed)
The patient walked in for a prescription that she thought Dr Johna Sheriff had left her Thursday or Friday.  I didn't see it on the chart.  I paged  Dr Johna Sheriff and he gave an order for Roxicet 5-325 mg / 5 ml dispense take 5- 10 mls po q 4 prn.  I wrote the prescription and had Dr Gerrit Friends to sign it for her.  The patient asked to be weighed and I did that for her while she was here.

## 2012-03-30 DIAGNOSIS — G43709 Chronic migraine without aura, not intractable, without status migrainosus: Secondary | ICD-10-CM | POA: Diagnosis not present

## 2012-04-04 ENCOUNTER — Telehealth (INDEPENDENT_AMBULATORY_CARE_PROVIDER_SITE_OTHER): Payer: Self-pay | Admitting: General Surgery

## 2012-04-04 NOTE — Telephone Encounter (Signed)
Pt called for advice: has body aches, fever 99.5, vomiting x1 and diarrhea.  Admits she was exposed to flu recently.  Advised her to call her PCP for possible Tamiflu prescription.  Further advised her to push po fluids AMAP, especially water and gatorade.  She states she will call.

## 2012-04-07 ENCOUNTER — Encounter (HOSPITAL_COMMUNITY): Payer: Self-pay | Admitting: Emergency Medicine

## 2012-04-07 ENCOUNTER — Telehealth (INDEPENDENT_AMBULATORY_CARE_PROVIDER_SITE_OTHER): Payer: Self-pay

## 2012-04-07 ENCOUNTER — Encounter: Payer: BC Managed Care – PPO | Attending: General Surgery | Admitting: *Deleted

## 2012-04-07 ENCOUNTER — Encounter: Payer: Self-pay | Admitting: *Deleted

## 2012-04-07 ENCOUNTER — Emergency Department (HOSPITAL_COMMUNITY): Payer: BC Managed Care – PPO

## 2012-04-07 ENCOUNTER — Emergency Department (HOSPITAL_COMMUNITY)
Admission: EM | Admit: 2012-04-07 | Discharge: 2012-04-08 | Disposition: A | Discharge status: Home / Self Care | Payer: BC Managed Care – PPO | Attending: Emergency Medicine | Admitting: Emergency Medicine

## 2012-04-07 DIAGNOSIS — R197 Diarrhea, unspecified: Secondary | ICD-10-CM | POA: Insufficient documentation

## 2012-04-07 DIAGNOSIS — G43909 Migraine, unspecified, not intractable, without status migrainosus: Secondary | ICD-10-CM | POA: Diagnosis not present

## 2012-04-07 DIAGNOSIS — Z01818 Encounter for other preprocedural examination: Secondary | ICD-10-CM | POA: Insufficient documentation

## 2012-04-07 DIAGNOSIS — E785 Hyperlipidemia, unspecified: Secondary | ICD-10-CM | POA: Diagnosis not present

## 2012-04-07 DIAGNOSIS — R112 Nausea with vomiting, unspecified: Secondary | ICD-10-CM | POA: Insufficient documentation

## 2012-04-07 DIAGNOSIS — G40909 Epilepsy, unspecified, not intractable, without status epilepticus: Secondary | ICD-10-CM | POA: Diagnosis not present

## 2012-04-07 DIAGNOSIS — Z9889 Other specified postprocedural states: Secondary | ICD-10-CM | POA: Diagnosis not present

## 2012-04-07 DIAGNOSIS — F329 Major depressive disorder, single episode, unspecified: Secondary | ICD-10-CM | POA: Insufficient documentation

## 2012-04-07 DIAGNOSIS — Z8739 Personal history of other diseases of the musculoskeletal system and connective tissue: Secondary | ICD-10-CM | POA: Diagnosis not present

## 2012-04-07 DIAGNOSIS — F411 Generalized anxiety disorder: Secondary | ICD-10-CM | POA: Diagnosis not present

## 2012-04-07 DIAGNOSIS — E119 Type 2 diabetes mellitus without complications: Secondary | ICD-10-CM | POA: Diagnosis not present

## 2012-04-07 DIAGNOSIS — K219 Gastro-esophageal reflux disease without esophagitis: Secondary | ICD-10-CM | POA: Diagnosis not present

## 2012-04-07 DIAGNOSIS — Z79899 Other long term (current) drug therapy: Secondary | ICD-10-CM | POA: Insufficient documentation

## 2012-04-07 DIAGNOSIS — E1149 Type 2 diabetes mellitus with other diabetic neurological complication: Secondary | ICD-10-CM | POA: Insufficient documentation

## 2012-04-07 DIAGNOSIS — Z713 Dietary counseling and surveillance: Secondary | ICD-10-CM | POA: Diagnosis not present

## 2012-04-07 DIAGNOSIS — R109 Unspecified abdominal pain: Secondary | ICD-10-CM

## 2012-04-07 DIAGNOSIS — Z872 Personal history of diseases of the skin and subcutaneous tissue: Secondary | ICD-10-CM | POA: Diagnosis not present

## 2012-04-07 DIAGNOSIS — I10 Essential (primary) hypertension: Secondary | ICD-10-CM | POA: Diagnosis not present

## 2012-04-07 DIAGNOSIS — R630 Anorexia: Secondary | ICD-10-CM | POA: Diagnosis not present

## 2012-04-07 DIAGNOSIS — E7800 Pure hypercholesterolemia, unspecified: Secondary | ICD-10-CM | POA: Diagnosis not present

## 2012-04-07 DIAGNOSIS — R011 Cardiac murmur, unspecified: Secondary | ICD-10-CM | POA: Diagnosis not present

## 2012-04-07 DIAGNOSIS — F909 Attention-deficit hyperactivity disorder, unspecified type: Secondary | ICD-10-CM | POA: Diagnosis not present

## 2012-04-07 DIAGNOSIS — J309 Allergic rhinitis, unspecified: Secondary | ICD-10-CM | POA: Insufficient documentation

## 2012-04-07 DIAGNOSIS — E1142 Type 2 diabetes mellitus with diabetic polyneuropathy: Secondary | ICD-10-CM | POA: Insufficient documentation

## 2012-04-07 DIAGNOSIS — G8929 Other chronic pain: Secondary | ICD-10-CM | POA: Diagnosis not present

## 2012-04-07 DIAGNOSIS — F3289 Other specified depressive episodes: Secondary | ICD-10-CM | POA: Insufficient documentation

## 2012-04-07 DIAGNOSIS — M549 Dorsalgia, unspecified: Secondary | ICD-10-CM | POA: Insufficient documentation

## 2012-04-07 DIAGNOSIS — Z9049 Acquired absence of other specified parts of digestive tract: Secondary | ICD-10-CM | POA: Insufficient documentation

## 2012-04-07 LAB — URINALYSIS, DIPSTICK ONLY
Bilirubin Urine: NEGATIVE
Glucose, UA: NEGATIVE mg/dL
Ketones, ur: NEGATIVE mg/dL
Nitrite: NEGATIVE
Protein, ur: NEGATIVE mg/dL
pH: 7.5 (ref 5.0–8.0)

## 2012-04-07 LAB — CBC WITH DIFFERENTIAL/PLATELET
Basophils Relative: 0 % (ref 0–1)
Eosinophils Absolute: 0.1 10*3/uL (ref 0.0–0.7)
Eosinophils Relative: 1 % (ref 0–5)
Hemoglobin: 12.6 g/dL (ref 12.0–15.0)
Lymphs Abs: 2.5 10*3/uL (ref 0.7–4.0)
MCH: 26.8 pg (ref 26.0–34.0)
MCHC: 32.4 g/dL (ref 30.0–36.0)
MCV: 82.6 fL (ref 78.0–100.0)
Monocytes Absolute: 0.5 10*3/uL (ref 0.1–1.0)
Monocytes Relative: 5 % (ref 3–12)
RBC: 4.71 MIL/uL (ref 3.87–5.11)

## 2012-04-07 LAB — URINALYSIS, ROUTINE W REFLEX MICROSCOPIC
Bilirubin Urine: NEGATIVE
Hgb urine dipstick: NEGATIVE
Ketones, ur: NEGATIVE mg/dL
Specific Gravity, Urine: 1.014 (ref 1.005–1.030)
pH: 7.5 (ref 5.0–8.0)

## 2012-04-07 LAB — COMPREHENSIVE METABOLIC PANEL
ALT: 17 U/L (ref 0–35)
BUN: 6 mg/dL (ref 6–23)
CO2: 28 mEq/L (ref 19–32)
Calcium: 9.5 mg/dL (ref 8.4–10.5)
Creatinine, Ser: 0.63 mg/dL (ref 0.50–1.10)
GFR calc Af Amer: >90 mL/min (ref >90)
GFR calc non Af Amer: >90 mL/min (ref >90)
Glucose, Bld: 89 mg/dL (ref 70–99)
Sodium: 141 mEq/L (ref 135–145)
Total Protein: 7 g/dL (ref 6.0–8.3)

## 2012-04-07 LAB — LIPASE, BLOOD: Lipase: 23 U/L (ref 11–59)

## 2012-04-07 MED ORDER — IOHEXOL 300 MG/ML  SOLN
100.0000 mL | Freq: Once | INTRAMUSCULAR | Status: AC | PRN
  Administered 2012-04-07: 100 mL via INTRAVENOUS

## 2012-04-07 MED ORDER — HYDROMORPHONE HCL PF 1 MG/ML IJ SOLN
0.5000 mg | Freq: Once | INTRAMUSCULAR | Status: AC
  Administered 2012-04-07: 0.5 mg via INTRAVENOUS
  Filled 2012-04-07: qty 1

## 2012-04-07 MED ORDER — PANTOPRAZOLE SODIUM 40 MG IV SOLR
40.0000 mg | Freq: Once | INTRAVENOUS | Status: AC
  Administered 2012-04-07: 40 mg via INTRAVENOUS
  Filled 2012-04-07: qty 40

## 2012-04-07 MED ORDER — SODIUM CHLORIDE 0.9 % IV SOLN
Freq: Once | INTRAVENOUS | Status: AC
  Administered 2012-04-07: 20:00:00 via INTRAVENOUS

## 2012-04-07 MED ORDER — PROMETHAZINE HCL 25 MG/ML IJ SOLN
25.0000 mg | Freq: Once | INTRAMUSCULAR | Status: AC
  Administered 2012-04-07: 25 mg via INTRAMUSCULAR
  Filled 2012-04-07: qty 1

## 2012-04-07 MED ORDER — FAMOTIDINE 20 MG PO TABS: 20.0000 mg | ORAL_TABLET | Freq: Two times a day (BID) | ORAL | Status: DC

## 2012-04-07 NOTE — Telephone Encounter (Signed)
Pt calling into the office b/c she is having so much pain in her stomach which she thinks is coming from her stomach ulcer. The pt stated that her pain level is about a 9 at the moment. The pt is requesting a refill on her Roxicet. The pt said she is being follwed by Dr Johna Sheriff and she is needing some pain medicine. I went over her last phone note of her calling in on 1/13 stating that she thought she had the flu with her symptoms. The pt was supposed to call her PCP and the pt replied that all of her symptoms went away. I spoke to Palmer about the pt and she said if the pt is having that much pain she needed to go to the ER. Dr Johna Sheriff is pm off. The pt is going to the ER now.

## 2012-04-07 NOTE — Patient Instructions (Addendum)
Goals:  Follow Phase 3B: High Protein + Non-Starchy Vegetables  Increase lean protein foods to meet 60-80g goal  Continue fluid intake of 64oz +  Add 15 grams of carbohydrate (fruit, whole grain, starchy vegetable) with meals  Aim for >30 min of physical activity daily

## 2012-04-07 NOTE — Progress Notes (Addendum)
Follow-up visit:  4 Months Post-Operative RYGB Surgery  Medical Nutrition Therapy:  Appt start time: 1145  End time:  1215.  Primary concerns today:  Post-operative Bariatric Surgery Nutrition Management. Reports her ulcers are still causing her chronic pain in ULQ (with or without food). Is out of pain medicine. Dietary intake includes <60g protein. Drinking decaf coffee, tea, and water = ~64+ oz; 4 oz filet w/ asparagus and 1/2 c sweet potatoes one night recently. Getting protein in, though not tolerating chicken or steak. Participating in BELT program.    Surgery date: 11/30/11 Surgery type: RYGB Start weight @ [facility]: 290.5 lbs (09/04/11)  Weight today: 216.0 lbs Weight change: 19.5 lbs Total weight lost: 74.5 lbs BMI: 33.8 kg/m^2  Weight loss goal: 175 lbs % goal met: 65%  TANITA BODY COMP RESULTS   09/04/11  12/15/11  12/29/11 01/26/12 02/25/12 04/07/12  Fat Mass (lbs)  157.0  135.0  133.5 124.0 112.0 98.5  Fat Free Mass (lbs)  133.5  134.5  122.5 125.5 123.5 117.5  Total Body Water (lbs)  97.5  98.5  89.5 92.0 90.5 86.0   Fluid intake:  64+ oz Estimated total protein intake:  [employer] pro bars (20g x 1), lean protein (4 oz) = 60g    Medications: See medication list.  Supplementation: Taking as directed  CBG monitoring: No longer checking Average CBG per patient: "normal" per pt Last HgA1c (in hospital 03/13/12 per pt): 6.1%  Lab Results  Component Value Date   HGBA1C 6.3* 03/04/2012   Using straws: No Drinking while eating: Usually not Hair loss: Mild to Moderate Carbonated beverages: No N/V/D/C:  Vomited bagel thin w/ cream cheese; hibachi steak and chicken not well tolerated. Dumping syndrome: None  Recent physical activity:  2-3 days/wk BELT program - migraines keep her from going consistently  Progress Towards Goal(s):  In progress.   Nutritional Diagnosis:  [place]-3.3 Overweight/obesity As related to recent RYGB surgery.  As evidenced by patient attempting to follow  post-op nutritional guidelines for continued weight loss.    Samples given during visit include:   Freedavite MVI: 2 bottles/5 tabs per bottle Lot # [id number]; Exp: 01/17  Corliss Marcus Protein Powder: 3 pkts Lot # [id number]; Exp: 10/14  Intervention:  Nutrition education/diet advancement.  Monitoring/Evaluation:  Dietary intake, exercise, and body weight. Follow up in 6 weeks for 5-6 month post-op visit.

## 2012-04-07 NOTE — ED Notes (Signed)
Pt complains of abdominal pain and nausea x 1 day

## 2012-04-07 NOTE — ED Provider Notes (Signed)
History     CSN: [id number]  Arrival date & time 04/07/12  1618   First MD Initiated Contact with Patient 04/07/12 1853      Chief Complaint  Patient presents with  . Abdominal Pain    (Consider location/radiation/quality/duration/timing/severity/associated sxs/prior treatment) Patient is a 37 y.o. female presenting with abdominal pain. The history is provided by the patient.  Abdominal Pain The primary symptoms of the illness include abdominal pain.  She had been admitted to the hospital 3 weeks ago for abdominal pain which was found to be a marginal ulcer at the site of Roux-en-Y bypass. She says the pain had not gone away but had improved. Since discharge, she complains of anorexia and diarrhea. This morning, she developed a severe pain in the same location which is described as sharp and without radiation. The pain is in the left upper quadrant. She rates the pain a 10/10. There's been associated nausea and vomiting. Nothing makes the pain better. It is worse with trying to walk. She has chronic back pain which is unchanged. She has not taken any medication at home for  Past Medical History  Diagnosis Date  . Depression   . Anxiety   . Diabetes mellitus   . GERD (gastroesophageal reflux disease)   . ADD (attention deficit disorder)   . Hyperlipemia   . Herniated disc   . Allergy     Seasonal  . Diabetic neuropathy   . Shortness of breath     with exertion   . Sleep apnea     obsructive settings at 13   . Heart murmur     asa child  . Seizures     hx of / month ago - dehydration   . 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 Cholecystectomy   . Cesarean section   . Tonsillectomy   . Laporoscopy 2007  . Breath tek h pylori 09/11/2011    Procedure: BREATH TEK H PYLORI;  Surgeon: Mariella Saa, MD;  Location: [facility];  Service: General;  Laterality: N/A;  . Gastric  roux-en-y 11/30/2011    Procedure: LAPAROSCOPIC ROUX-EN-Y GASTRIC;  Surgeon: Mariella Saa, MD;  Location: [facility];  Service: General;  Laterality: N/A;  . Bowel resection 11/30/2011    Procedure: SMALL BOWEL RESECTION;  Surgeon: Mariella Saa, MD;  Location: [facility];  Service: General;;  . Esophagogastroduodenoscopy 03/14/2012    Procedure: ESOPHAGOGASTRODUODENOSCOPY (EGD);  Surgeon: Kandis Cocking, MD;  Location: [facility];  Service: General;  Laterality: N/A;    Family History  Problem Relation Age of Onset  . Diabetes type II    . Diabetes Mother   . Hypertension Mother   . Depression Brother   . Cancer Maternal Aunt     ovarian   . Depression Maternal Aunt   . Diabetes Maternal Aunt   . Cancer Maternal Grandmother     Lung  . COPD Maternal Grandmother   . Cancer Maternal Grandfather     Prostate  . Diabetes Maternal Grandfather   . Hearing loss Maternal Grandfather   . Hypertension Maternal Grandfather   . Stroke Maternal Grandfather   . Cancer Paternal Grandmother     Colon    History  Substance Use Topics  . Smoking status: Never Smoker   . Smokeless tobacco: Never Used  . Alcohol Use: No  OB History    Grav Para Term Preterm Abortions TAB SAB Ect Mult Living                  Review of Systems  Gastrointestinal: Positive for abdominal pain.  All other systems reviewed and are negative.    Allergies  Atarax; Cephalosporins; Codeine; Compazine; Decadron; Demerol; Metoclopramide hcl; Morphine and related; Penicillins; Sulfa antibiotics; Triptans; Zofran; Pork-derived products; and Shellfish-derived products  Home Medications   Current Outpatient Rx  Name  Route  Sig  Dispense  Refill  . ALPRAZOLAM 1 MG PO TABS   Oral   Take 1 mg by mouth 4 (four) times daily as needed. For anxiety         . CALCIUM CARBONATE-VITAMIN D 500-200 MG-UNIT PO TABS   Oral   Take 1 tablet by mouth daily.         . CYCLOBENZAPRINE HCL 10 MG PO TABS   Oral    Take 10 mg by mouth 3 (three) times daily as needed. For headaches         . KETOCONAZOLE 2 % EX SHAM   Topical   Apply 1 application topically daily as needed. For scalp itching/pain.         . METHYLPHENIDATE HCL ER 25 MG/5ML PO SUSR   Oral   Take 12 mLs by mouth daily.         . ADULT GUMMY PO CHEW   Oral   Chew 2 tablets by mouth daily.         . OXYCODONE-ACETAMINOPHEN 5-325 MG/5ML PO SOLN   Oral   Take 5-10 mLs by mouth every 4 (four) hours as needed. For pain         . PANTOPRAZOLE SODIUM 40 MG PO TBEC   Oral   Take 40 mg by mouth 2 times daily at 12 noon and 4 pm. Takes at 12p and 4p         . PERPHENAZINE 4 MG PO TABS   Oral   Take 8 mg by mouth 3 (three) times daily as needed. Nausea/vomiting         . PROMETHAZINE HCL 25 MG/ML IJ SOLN   Intravenous   Inject 25 mg into the vein 4 (four) times daily as needed.         . SUCRALFATE 1 GM/10ML PO SUSP   Oral   Take 1 g by mouth every 6 (six) hours.         . TOPIRAMATE 25 MG PO CPSP   Oral   Take 100 mg by mouth 2 (two) times daily. Open capsules and mix in food/drink and consume all at once         . TRAZODONE HCL 50 MG PO TABS   Oral   Take 50-200 mg by mouth at bedtime.         Marland Kitchen VITAMIN B-12 1000 MCG PO TABS   Oral   Take 2,500 mcg by mouth at bedtime.         . ALBUTEROL SULFATE HFA 108 (90 BASE) MCG/ACT IN AERS   Inhalation   Inhale 2 puffs into the lungs every 6 (six) hours as needed. For shortness of breath.         . BEPOTASTINE BESILATE 1.5 % OP SOLN   Ophthalmic   Apply 1 drop to eye 3 (three) times daily as needed. Itching/allergies           BP 113/67  Pulse 96  Temp 98.6 F (37 C) (Oral)  Resp 20  SpO2 100%  Physical Exam  Nursing note and vitals reviewed.  37 year old female, resting comfortably and in no acute distress. Vital signs are normal. Oxygen saturation is 100%, which is normal. Head is normocephalic and atraumatic. PERRLA, EOMI. Oropharynx is  clear. Neck is nontender and supple without adenopathy or JVD. Back is nontender and there is no CVA tenderness. Lungs are clear without rales, wheezes, or rhonchi. Chest is nontender. Heart has regular rate and rhythm without murmur. Abdomen is soft, flat, with a mild to moderate tenderness in the left upper quadrant without rebound or guarding. There are no masses or hepatosplenomegaly and peristalsis is normoactive. Extremities have no cyanosis or edema, full range of motion is present. Skin is warm and dry without rash. Neurologic: Mental status is normal, cranial nerves are intact, there are no motor or sensory deficits.  ED Course  Procedures (including critical care time)  Results for orders placed during the hospital encounter of 04/07/12  URINALYSIS, DIPSTICK ONLY      Component Value Range   Specific Gravity, Urine 1.014  1.005 - 1.030   pH 7.5  5.0 - 8.0   Glucose, UA NEGATIVE  NEGATIVE mg/dL   Hgb urine dipstick NEGATIVE  NEGATIVE   Bilirubin Urine NEGATIVE  NEGATIVE   Ketones, ur NEGATIVE  NEGATIVE mg/dL   Protein, ur NEGATIVE  NEGATIVE mg/dL   Urobilinogen, UA 0.2  0.0 - 1.0 mg/dL   Nitrite NEGATIVE  NEGATIVE   Leukocytes, UA TRACE (*) NEGATIVE  URINALYSIS, ROUTINE W REFLEX MICROSCOPIC      Component Value Range   Color, Urine YELLOW  YELLOW   APPearance CLOUDY (*) CLEAR   Specific Gravity, Urine 1.014  1.005 - 1.030   pH 7.5  5.0 - 8.0   Glucose, UA NEGATIVE  NEGATIVE mg/dL   Hgb urine dipstick NEGATIVE  NEGATIVE   Bilirubin Urine NEGATIVE  NEGATIVE   Ketones, ur NEGATIVE  NEGATIVE mg/dL   Protein, ur NEGATIVE  NEGATIVE mg/dL   Urobilinogen, UA 0.2  0.0 - 1.0 mg/dL   Nitrite NEGATIVE  NEGATIVE   Leukocytes, UA TRACE (*) NEGATIVE  CBC WITH DIFFERENTIAL      Component Value Range   WBC 9.3  4.0 - 10.5 K/uL   RBC 4.71  3.87 - 5.11 MIL/uL   Hemoglobin 12.6  12.0 - 15.0 g/dL   HCT 40.9  81.1 - 91.4 %   MCV 82.6  78.0 - 100.0 fL   MCH 26.8  26.0 - 34.0 pg    MCHC 32.4  30.0 - 36.0 g/dL   RDW 78.2  95.6 - 21.3 %   Platelets 245  150 - 400 K/uL   Neutrophils Relative 66  43 - 77 %   Neutro Abs 6.1  1.7 - 7.7 K/uL   Lymphocytes Relative 27  12 - 46 %   Lymphs Abs 2.5  0.7 - 4.0 K/uL   Monocytes Relative 5  3 - 12 %   Monocytes Absolute 0.5  0.1 - 1.0 K/uL   Eosinophils Relative 1  0 - 5 %   Eosinophils Absolute 0.1  0.0 - 0.7 K/uL   Basophils Relative 0  0 - 1 %   Basophils Absolute 0.0  0.0 - 0.1 K/uL  PREGNANCY, URINE      Component Value Range   Preg Test, Ur NEGATIVE  NEGATIVE  COMPREHENSIVE METABOLIC PANEL      Component Value Range  Sodium 141  135 - 145 mEq/L   Potassium 3.3 (*) 3.5 - 5.1 mEq/L   Chloride 101  96 - 112 mEq/L   CO2 28  19 - 32 mEq/L   Glucose, Bld 89  70 - 99 mg/dL   BUN 6  6 - 23 mg/dL   Creatinine, Ser 1.19  0.50 - 1.10 mg/dL   Calcium 9.5  8.4 - 14.7 mg/dL   Total Protein 7.0  6.0 - 8.3 g/dL   Albumin 3.8  3.5 - 5.2 g/dL   AST 15  0 - 37 U/L   ALT 17  0 - 35 U/L   Alkaline Phosphatase 74  39 - 117 U/L   Total Bilirubin 0.2 (*) 0.3 - 1.2 mg/dL   GFR calc non Af Amer >90  >90 mL/min   GFR calc Af Amer >90  >90 mL/min  LIPASE, BLOOD      Component Value Range   Lipase 23  11 - 59 U/L  URINE MICROSCOPIC-ADD ON      Component Value Range   Squamous Epithelial / LPF RARE  RARE   Ct Abdomen Pelvis W Contrast  04/07/2012  *RADIOLOGY REPORT*  Clinical Data: Nausea, abdominal pain.  CT ABDOMEN AND PELVIS WITH CONTRAST  Technique:  Multidetector CT imaging of the abdomen and pelvis was performed following the standard protocol during bolus administration of intravenous contrast.  Contrast: OMNIPAQUE IOHEXOL 300 MG/ML  SOLN  Comparison: 03/08/2012  Findings: Stable dependent atelectasis or scarring in the posterior aspect of the visualized lung bases.  Staple lines around the stomach as before.  Vascular clips in the gallbladder fossa. Unremarkable liver, spleen, adrenal glands, kidneys, pancreas. Stomach,  small bowel, and colon decompressed.  Appendix not identified.  IUD in the uterus.  Urinary bladder incompletely distended.  No ascites.  No free air. There is increased number of sub centimeter lymph nodes in the right lower quadrant mesentery, stable since prior study.  Portal vein patent. The lumbar spine unremarkable.  IMPRESSION:  1.  Stable postoperative changes.  No acute abnormality.   Original Report Authenticated By: D. Andria Rhein, MD       1. Abdominal pain       MDM  Left upper quadrant pain which is probably an exacerbation of her marginal ulcer. Old records are reviewed confirming that she had been admitted 3 weeks ago and endoscopy showed a marginal ulcer. Because of a new pain and ongoing problems with pain, CT scan will be obtained to rule out other pathology. She is already on a proton pump inhibitor.  Laboratory workup and CT scan are unremarkable. She was given a dose of his ondansetron hydromorphone in the ED and then requested additional hydromorphone. She is advised of the results of workup. I suspect her pain is due to the marginal ulcer which had been seen previously. She is alert and maximum dose of proton pump inhibitor. I will try adding an H2 blocker and she's given a prescription for Pepcid. She is to followup with her surgeon and her PCP.     Dione Booze, MD 04/07/12 [phone]

## 2012-04-12 ENCOUNTER — Telehealth (INDEPENDENT_AMBULATORY_CARE_PROVIDER_SITE_OTHER): Payer: Self-pay | Admitting: General Surgery

## 2012-04-12 NOTE — Telephone Encounter (Signed)
Pt called to report she went to the ER yesterday for "pain from ulcers."  They did a CT scan (negative) and labs (CBC, CMET and Ua---all negative.)  While there she received small dose of Dilaudid with Protonix and phenergan, none of which helded her pain.  Pt calling today to ask Dr. Johna Sheriff for additional pain medicine.  Paged Dr. Johna Sheriff for advice.

## 2012-04-13 ENCOUNTER — Other Ambulatory Visit (INDEPENDENT_AMBULATORY_CARE_PROVIDER_SITE_OTHER): Payer: Self-pay | Admitting: General Surgery

## 2012-04-14 ENCOUNTER — Ambulatory Visit (INDEPENDENT_AMBULATORY_CARE_PROVIDER_SITE_OTHER): Payer: BC Managed Care – PPO | Admitting: General Surgery

## 2012-04-15 ENCOUNTER — Other Ambulatory Visit (INDEPENDENT_AMBULATORY_CARE_PROVIDER_SITE_OTHER): Payer: Self-pay

## 2012-04-15 ENCOUNTER — Encounter (INDEPENDENT_AMBULATORY_CARE_PROVIDER_SITE_OTHER): Payer: Self-pay | Admitting: General Surgery

## 2012-04-15 ENCOUNTER — Ambulatory Visit (INDEPENDENT_AMBULATORY_CARE_PROVIDER_SITE_OTHER): Payer: BC Managed Care – PPO | Admitting: General Surgery

## 2012-04-15 VITALS — BP 136/86 | HR 100 | Temp 97.8°F | Resp 18 | Ht 67.0 in | Wt 213.0 lb

## 2012-04-15 DIAGNOSIS — Z9884 Bariatric surgery status: Secondary | ICD-10-CM

## 2012-04-15 DIAGNOSIS — Z09 Encounter for follow-up examination after completed treatment for conditions other than malignant neoplasm: Secondary | ICD-10-CM | POA: Diagnosis not present

## 2012-04-15 DIAGNOSIS — K912 Postsurgical malabsorption, not elsewhere classified: Secondary | ICD-10-CM | POA: Diagnosis not present

## 2012-04-15 DIAGNOSIS — K289 Gastrojejunal ulcer, unspecified as acute or chronic, without hemorrhage or perforation: Secondary | ICD-10-CM | POA: Diagnosis not present

## 2012-04-15 MED ORDER — OXYCODONE-ACETAMINOPHEN 5-325 MG/5ML PO SOLN: 5.0000 mL | Freq: Four times a day (QID) | ORAL | Status: DC | PRN

## 2012-04-15 NOTE — Patient Instructions (Signed)
Please check with your neurologist regarding unsteadiness and dizziness Use pain medication only when not driving or caring for children
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
## 2012-04-19 ENCOUNTER — Encounter (INDEPENDENT_AMBULATORY_CARE_PROVIDER_SITE_OTHER): Payer: Self-pay

## 2012-04-20 ENCOUNTER — Encounter (INDEPENDENT_AMBULATORY_CARE_PROVIDER_SITE_OTHER): Payer: Self-pay

## 2012-04-22 ENCOUNTER — Telehealth (INDEPENDENT_AMBULATORY_CARE_PROVIDER_SITE_OTHER): Payer: Self-pay | Admitting: General Surgery

## 2012-04-22 ENCOUNTER — Encounter (HOSPITAL_COMMUNITY): Payer: Self-pay | Admitting: Emergency Medicine

## 2012-04-22 ENCOUNTER — Ambulatory Visit (INDEPENDENT_AMBULATORY_CARE_PROVIDER_SITE_OTHER): Payer: BC Managed Care – PPO | Admitting: General Surgery

## 2012-04-22 ENCOUNTER — Emergency Department (HOSPITAL_COMMUNITY)
Admission: EM | Admit: 2012-04-22 | Discharge: 2012-04-23 | Disposition: A | Discharge status: Home / Self Care | Payer: BC Managed Care – PPO | Attending: Emergency Medicine | Admitting: Emergency Medicine

## 2012-04-22 DIAGNOSIS — Z8719 Personal history of other diseases of the digestive system: Secondary | ICD-10-CM | POA: Diagnosis not present

## 2012-04-22 DIAGNOSIS — Z8673 Personal history of transient ischemic attack (TIA), and cerebral infarction without residual deficits: Secondary | ICD-10-CM | POA: Insufficient documentation

## 2012-04-22 DIAGNOSIS — R011 Cardiac murmur, unspecified: Secondary | ICD-10-CM | POA: Diagnosis not present

## 2012-04-22 DIAGNOSIS — F411 Generalized anxiety disorder: Secondary | ICD-10-CM | POA: Diagnosis not present

## 2012-04-22 DIAGNOSIS — G40909 Epilepsy, unspecified, not intractable, without status epilepticus: Secondary | ICD-10-CM | POA: Insufficient documentation

## 2012-04-22 DIAGNOSIS — Z79899 Other long term (current) drug therapy: Secondary | ICD-10-CM | POA: Insufficient documentation

## 2012-04-22 DIAGNOSIS — E1149 Type 2 diabetes mellitus with other diabetic neurological complication: Secondary | ICD-10-CM | POA: Insufficient documentation

## 2012-04-22 DIAGNOSIS — Z8739 Personal history of other diseases of the musculoskeletal system and connective tissue: Secondary | ICD-10-CM | POA: Diagnosis not present

## 2012-04-22 DIAGNOSIS — G43409 Hemiplegic migraine, not intractable, without status migrainosus: Secondary | ICD-10-CM | POA: Insufficient documentation

## 2012-04-22 DIAGNOSIS — G4733 Obstructive sleep apnea (adult) (pediatric): Secondary | ICD-10-CM | POA: Diagnosis not present

## 2012-04-22 DIAGNOSIS — F329 Major depressive disorder, single episode, unspecified: Secondary | ICD-10-CM | POA: Insufficient documentation

## 2012-04-22 DIAGNOSIS — R197 Diarrhea, unspecified: Secondary | ICD-10-CM | POA: Diagnosis not present

## 2012-04-22 DIAGNOSIS — R42 Dizziness and giddiness: Secondary | ICD-10-CM | POA: Diagnosis not present

## 2012-04-22 DIAGNOSIS — K219 Gastro-esophageal reflux disease without esophagitis: Secondary | ICD-10-CM | POA: Insufficient documentation

## 2012-04-22 DIAGNOSIS — R0602 Shortness of breath: Secondary | ICD-10-CM | POA: Diagnosis not present

## 2012-04-22 DIAGNOSIS — R112 Nausea with vomiting, unspecified: Secondary | ICD-10-CM | POA: Insufficient documentation

## 2012-04-22 DIAGNOSIS — F3289 Other specified depressive episodes: Secondary | ICD-10-CM | POA: Insufficient documentation

## 2012-04-22 DIAGNOSIS — Z9089 Acquired absence of other organs: Secondary | ICD-10-CM | POA: Diagnosis not present

## 2012-04-22 DIAGNOSIS — F988 Other specified behavioral and emotional disorders with onset usually occurring in childhood and adolescence: Secondary | ICD-10-CM | POA: Diagnosis not present

## 2012-04-22 DIAGNOSIS — Z9884 Bariatric surgery status: Secondary | ICD-10-CM | POA: Diagnosis not present

## 2012-04-22 DIAGNOSIS — E1142 Type 2 diabetes mellitus with diabetic polyneuropathy: Secondary | ICD-10-CM | POA: Insufficient documentation

## 2012-04-22 LAB — COMPREHENSIVE METABOLIC PANEL
ALT: 32 U/L (ref 0–35)
Alkaline Phosphatase: 83 U/L (ref 39–117)
BUN: 9 mg/dL (ref 6–23)
CO2: 28 mEq/L (ref 19–32)
GFR calc Af Amer: >90 mL/min (ref >90)
GFR calc non Af Amer: >90 mL/min (ref >90)
Glucose, Bld: 90 mg/dL (ref 70–99)
Potassium: 4.1 mEq/L (ref 3.5–5.1)
Total Protein: 6.9 g/dL (ref 6.0–8.3)

## 2012-04-22 LAB — URINALYSIS, MICROSCOPIC ONLY
Hgb urine dipstick: NEGATIVE
Nitrite: NEGATIVE
Protein, ur: NEGATIVE mg/dL
Specific Gravity, Urine: 1.025 (ref 1.005–1.030)
Urobilinogen, UA: 0.2 mg/dL (ref 0.0–1.0)

## 2012-04-22 LAB — CBC WITH DIFFERENTIAL/PLATELET
Eosinophils Absolute: 0.3 10*3/uL (ref 0.0–0.7)
Eosinophils Relative: 4 % (ref 0–5)
Hemoglobin: 12.7 g/dL (ref 12.0–15.0)
Lymphocytes Relative: 41 % (ref 12–46)
Lymphs Abs: 3.4 10*3/uL (ref 0.7–4.0)
MCH: 26.6 pg (ref 26.0–34.0)
MCV: 83.3 fL (ref 78.0–100.0)
Monocytes Relative: 6 % (ref 3–12)
RBC: 4.78 MIL/uL (ref 3.87–5.11)
WBC: 8.2 10*3/uL (ref 4.0–10.5)

## 2012-04-22 LAB — LIPASE, BLOOD: Lipase: 16 U/L (ref 11–59)

## 2012-04-22 MED ORDER — PROMETHAZINE HCL 25 MG/ML IJ SOLN
12.5000 mg | Freq: Once | INTRAMUSCULAR | Status: AC
  Administered 2012-04-22: 12.5 mg via INTRAVENOUS
  Filled 2012-04-22 (×2): qty 1

## 2012-04-22 MED ORDER — HYDROMORPHONE HCL PF 1 MG/ML IJ SOLN
1.0000 mg | Freq: Once | INTRAMUSCULAR | Status: AC
  Administered 2012-04-22: 1 mg via INTRAVENOUS
  Filled 2012-04-22: qty 1

## 2012-04-22 MED ORDER — SODIUM CHLORIDE 0.9 % IV SOLN: 1000.0000 mL | Freq: Once | INTRAVENOUS | Status: DC

## 2012-04-22 MED ORDER — SODIUM CHLORIDE 0.9 % IV SOLN
1000.0000 mL | Freq: Once | INTRAVENOUS | Status: AC
  Administered 2012-04-22: 1000 mL via INTRAVENOUS

## 2012-04-22 NOTE — ED Notes (Signed)
Pt presenting to ed with c/o abdominal pain with positive nausea and vomiting. Pt states she has an ulcer on her left side where the pain is. Pt states positive diarrhea.

## 2012-04-22 NOTE — ED Notes (Signed)
Pain protocols unable to be followed due to patient's extensive allergy list.

## 2012-04-22 NOTE — ED Notes (Signed)
Patient rating pain 8-9/10. Patient barely able to keep eyes open, and slurring her words.

## 2012-04-22 NOTE — ED Provider Notes (Signed)
History     CSN: [id number]  Arrival date & time 04/22/12  1743   First MD Initiated Contact with Patient 04/22/12 1953      Chief Complaint  Patient presents with  . Abdominal Pain    (Consider location/radiation/quality/duration/timing/severity/associated sxs/prior treatment) HPI Comments: Patient has had N/V/D for the past 2 days  Is unable to tolerate any PO   Has Hx of gastric bipass and recently was dx with ulcers that are being monitored by her surgeon and gastroenterologist   Patient is a 37 y.o. female presenting with abdominal pain. The history is provided by the patient.  Abdominal Pain The primary symptoms of the illness include abdominal pain, shortness of breath, nausea, vomiting and diarrhea. The primary symptoms of the illness do not include fever. The current episode started more than 2 days ago. The onset of the illness was gradual.  The patient states that she believes she is currently not pregnant. The patient has not had a change in bowel habit. Symptoms associated with the illness do not include chills, anorexia, constipation or back pain.    Past Medical History  Diagnosis Date  . Depression   . Anxiety   . Diabetes mellitus   . GERD (gastroesophageal reflux disease)   . ADD (attention deficit disorder)   . Hyperlipemia   . Herniated disc   . Allergy     Seasonal  . Diabetic neuropathy   . Shortness of breath     with exertion   . Sleep apnea     obsructive settings at 13   . Heart murmur     asa child  . Seizures     hx of / month ago - dehydration   . 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 Cholecystectomy   . Cesarean section   . Tonsillectomy   . Laporoscopy 2007  . Breath tek h pylori 09/11/2011    Procedure: BREATH TEK H PYLORI;  Surgeon: Mariella Saa, MD;  Location: [facility];  Service: General;  Laterality: N/A;  . Gastric  roux-en-y 11/30/2011    Procedure: LAPAROSCOPIC ROUX-EN-Y GASTRIC;  Surgeon: Mariella Saa, MD;  Location: [facility];  Service: General;  Laterality: N/A;  . Bowel resection 11/30/2011    Procedure: SMALL BOWEL RESECTION;  Surgeon: Mariella Saa, MD;  Location: [facility];  Service: General;;  . Esophagogastroduodenoscopy 03/14/2012    Procedure: ESOPHAGOGASTRODUODENOSCOPY (EGD);  Surgeon: Kandis Cocking, MD;  Location: [facility];  Service: General;  Laterality: N/A;    Family History  Problem Relation Age of Onset  . Diabetes type II    . Diabetes Mother   . Hypertension Mother   . Depression Brother   . Cancer Maternal Aunt     ovarian   . Depression Maternal Aunt   . Diabetes Maternal Aunt   . Cancer Maternal Grandmother     Lung  . COPD Maternal Grandmother   . Cancer Maternal Grandfather     Prostate  . Diabetes Maternal Grandfather   . Hearing loss Maternal Grandfather   . Hypertension Maternal Grandfather   . Stroke Maternal Grandfather   . Cancer Paternal Grandmother     Colon    History  Substance Use Topics  . Smoking status: Never Smoker   . Smokeless tobacco: Never Used  . Alcohol Use: No    OB  History    Grav Para Term Preterm Abortions TAB SAB Ect Mult Living                  Review of Systems  Constitutional: Negative for fever and chills.  HENT: Negative.   Eyes: Negative.   Respiratory: Positive for shortness of breath. Negative for cough.   Cardiovascular: Negative for chest pain and leg swelling.  Gastrointestinal: Positive for nausea, vomiting, abdominal pain and diarrhea. Negative for constipation and anorexia.  Musculoskeletal: Negative for back pain.  Skin: Negative for rash and wound.  Neurological: Positive for dizziness. Negative for weakness.    Allergies  Atarax; Cephalosporins; Codeine; Compazine; Decadron; Demerol; Metoclopramide hcl; Morphine and related; Penicillins; Sulfa antibiotics; Triptans; Zofran; Pork-derived  products; and Shellfish-derived products  Home Medications   Current Outpatient Rx  Name  Route  Sig  Dispense  Refill  . ALBUTEROL SULFATE HFA 108 (90 BASE) MCG/ACT IN AERS   Inhalation   Inhale 2 puffs into the lungs every 6 (six) hours as needed. For shortness of breath.         . ALPRAZOLAM 1 MG PO TABS   Oral   Take 1 mg by mouth 4 (four) times daily as needed. For anxiety         . BEPOTASTINE BESILATE 1.5 % OP SOLN   Ophthalmic   Apply 1 drop to eye 3 (three) times daily as needed. Itching/allergies         . CALCIUM CARBONATE-VITAMIN D 500-200 MG-UNIT PO TABS   Oral   Take 1 tablet by mouth daily.         . CYCLOBENZAPRINE HCL 10 MG PO TABS   Oral   Take 10 mg by mouth 3 (three) times daily as needed. For headaches         . FAMOTIDINE 20 MG PO TABS   Oral   Take 1 tablet (20 mg total) by mouth 2 (two) times daily.   20 tablet   0   . KETOCONAZOLE 2 % EX SHAM   Topical   Apply 1 application topically daily as needed. For scalp itching/pain.         . METHYLPHENIDATE HCL ER 25 MG/5ML PO SUSR   Oral   Take 12 mLs by mouth daily.         . ADULT GUMMY PO CHEW   Oral   Chew 2 tablets by mouth daily.         . OXYCODONE-ACETAMINOPHEN 5-325 MG/5ML PO SOLN   Oral   Take 5-10 mLs by mouth every 4 (four) hours as needed. For pain         . OXYCODONE-ACETAMINOPHEN 5-325 MG/5ML PO SOLN   Oral   Take 5 mLs by mouth every 6 (six) hours as needed for pain.   500 mL   0   . PANTOPRAZOLE SODIUM 40 MG PO TBEC   Oral   Take 40 mg by mouth 2 times daily at 12 noon and 4 pm. Takes at 12p and 4p         . PERPHENAZINE 4 MG PO TABS   Oral   Take 8 mg by mouth 3 (three) times daily as needed. Nausea/vomiting         . PROMETHAZINE HCL 25 MG/ML IJ SOLN   Intravenous   Inject 25 mg into the vein 4 (four) times daily as needed.         . SUCRALFATE 1 GM/10ML PO SUSP  Oral   Take 1 g by mouth every 6 (six) hours.         . TOPIRAMATE 25 MG  PO CPSP   Oral   Take 100 mg by mouth 2 (two) times daily. Open capsules and mix in food/drink and consume all at once         . TRAZODONE HCL 50 MG PO TABS   Oral   Take 50-200 mg by mouth at bedtime.         Marland Kitchen VITAMIN B-12 1000 MCG PO TABS   Oral   Take 2,500 mcg by mouth at bedtime.           BP 108/59  Pulse 98  Temp 98.7 F (37.1 C) (Oral)  Resp 20  SpO2 100%  Physical Exam  Constitutional: She is oriented to person, place, and time. She appears well-developed and well-nourished.       obese  HENT:  Head: Normocephalic and atraumatic.  Mouth/Throat: Oropharynx is clear and moist.  Eyes: Pupils are equal, round, and reactive to light.  Neck: Normal range of motion.  Cardiovascular: Normal rate and regular rhythm.   Pulmonary/Chest: Effort normal and breath sounds normal. No respiratory distress. She has no wheezes.  Abdominal: Soft. Bowel sounds are normal. She exhibits no distension. There is generalized tenderness.  Musculoskeletal: Normal range of motion.  Neurological: She is alert and oriented to person, place, and time.  Skin: Skin is warm. There is pallor.    ED Course  Procedures (including critical care time)  Labs Reviewed  COMPREHENSIVE METABOLIC PANEL - Abnormal; Notable for the following:    Total Bilirubin 0.2 (*)     All other components within normal limits  URINALYSIS, MICROSCOPIC ONLY - Abnormal; Notable for the following:    APPearance CLOUDY (*)     Glucose, UA 100 (*)     Bilirubin Urine SMALL (*)     Ketones, ur TRACE (*)     Leukocytes, UA MODERATE (*)     Bacteria, UA FEW (*)     Squamous Epithelial / LPF FEW (*)     Crystals CA OXALATE CRYSTALS (*)     All other components within normal limits  CBC WITH DIFFERENTIAL  LIPASE, BLOOD  URINE CULTURE   No results found.   No diagnosis found.    MDM   Labs reviewed-- within norma limits will treat symptoms  Tolerating fluids      Arman Filter, NP 04/23/12 [phone]

## 2012-04-22 NOTE — Telephone Encounter (Signed)
Pt called to report she is feeling really weak and asked if she needed to go to the hospital for IVFs.  Denies fever, nausea, vomiting or diarrhea.  Recommended instead she drink Gatorade to bolster her fluid status if she thinks she needs fluids.  Pt understands.

## 2012-04-23 NOTE — ED Notes (Signed)
Fluid challenge completed. Patient denies nausea.

## 2012-04-23 NOTE — ED Provider Notes (Signed)
Medical screening examination/treatment/procedure(s) were performed by non-physician practitioner and as supervising physician I was immediately available for consultation/collaboration.  Davidlee Jeanbaptiste R. Kris No, MD 04/23/12 1459 

## 2012-04-24 ENCOUNTER — Encounter (HOSPITAL_COMMUNITY): Payer: Self-pay

## 2012-04-24 ENCOUNTER — Observation Stay (HOSPITAL_COMMUNITY): Payer: BC Managed Care – PPO

## 2012-04-24 ENCOUNTER — Inpatient Hospital Stay (HOSPITAL_COMMUNITY)
Admission: EM | Admit: 2012-04-24 | Discharge: 2012-04-27 | DRG: 176 | Disposition: A | Discharge status: Home / Self Care | Payer: BC Managed Care – PPO | Attending: General Surgery | Admitting: General Surgery

## 2012-04-24 ENCOUNTER — Telehealth (INDEPENDENT_AMBULATORY_CARE_PROVIDER_SITE_OTHER): Payer: Self-pay | Admitting: General Surgery

## 2012-04-24 DIAGNOSIS — Z885 Allergy status to narcotic agent status: Secondary | ICD-10-CM

## 2012-04-24 DIAGNOSIS — R112 Nausea with vomiting, unspecified: Secondary | ICD-10-CM | POA: Diagnosis not present

## 2012-04-24 DIAGNOSIS — E860 Dehydration: Secondary | ICD-10-CM | POA: Diagnosis present

## 2012-04-24 DIAGNOSIS — R1012 Left upper quadrant pain: Secondary | ICD-10-CM | POA: Diagnosis present

## 2012-04-24 DIAGNOSIS — IMO0002 Reserved for concepts with insufficient information to code with codable children: Secondary | ICD-10-CM | POA: Diagnosis present

## 2012-04-24 DIAGNOSIS — F411 Generalized anxiety disorder: Secondary | ICD-10-CM | POA: Diagnosis present

## 2012-04-24 DIAGNOSIS — K289 Gastrojejunal ulcer, unspecified as acute or chronic, without hemorrhage or perforation: Principal | ICD-10-CM | POA: Diagnosis present

## 2012-04-24 DIAGNOSIS — F988 Other specified behavioral and emotional disorders with onset usually occurring in childhood and adolescence: Secondary | ICD-10-CM | POA: Diagnosis present

## 2012-04-24 DIAGNOSIS — E1149 Type 2 diabetes mellitus with other diabetic neurological complication: Secondary | ICD-10-CM | POA: Diagnosis present

## 2012-04-24 DIAGNOSIS — R1013 Epigastric pain: Secondary | ICD-10-CM | POA: Diagnosis present

## 2012-04-24 DIAGNOSIS — J309 Allergic rhinitis, unspecified: Secondary | ICD-10-CM | POA: Diagnosis present

## 2012-04-24 DIAGNOSIS — F3289 Other specified depressive episodes: Secondary | ICD-10-CM | POA: Diagnosis present

## 2012-04-24 DIAGNOSIS — F329 Major depressive disorder, single episode, unspecified: Secondary | ICD-10-CM | POA: Diagnosis present

## 2012-04-24 DIAGNOSIS — Z9049 Acquired absence of other specified parts of digestive tract: Secondary | ICD-10-CM

## 2012-04-24 DIAGNOSIS — K912 Postsurgical malabsorption, not elsewhere classified: Secondary | ICD-10-CM | POA: Diagnosis not present

## 2012-04-24 DIAGNOSIS — Z882 Allergy status to sulfonamides status: Secondary | ICD-10-CM

## 2012-04-24 DIAGNOSIS — G4733 Obstructive sleep apnea (adult) (pediatric): Secondary | ICD-10-CM | POA: Diagnosis present

## 2012-04-24 DIAGNOSIS — Z9884 Bariatric surgery status: Secondary | ICD-10-CM

## 2012-04-24 DIAGNOSIS — Z888 Allergy status to other drugs, medicaments and biological substances status: Secondary | ICD-10-CM

## 2012-04-24 DIAGNOSIS — Z9089 Acquired absence of other organs: Secondary | ICD-10-CM

## 2012-04-24 DIAGNOSIS — Z09 Encounter for follow-up examination after completed treatment for conditions other than malignant neoplasm: Secondary | ICD-10-CM

## 2012-04-24 DIAGNOSIS — Z880 Allergy status to penicillin: Secondary | ICD-10-CM

## 2012-04-24 DIAGNOSIS — E1142 Type 2 diabetes mellitus with diabetic polyneuropathy: Secondary | ICD-10-CM | POA: Diagnosis present

## 2012-04-24 DIAGNOSIS — E785 Hyperlipidemia, unspecified: Secondary | ICD-10-CM | POA: Diagnosis present

## 2012-04-24 DIAGNOSIS — K219 Gastro-esophageal reflux disease without esophagitis: Secondary | ICD-10-CM | POA: Diagnosis present

## 2012-04-24 LAB — CBC WITH DIFFERENTIAL/PLATELET
Basophils Relative: 1 % (ref 0–1)
Eosinophils Absolute: 0.2 10*3/uL (ref 0.0–0.7)
Eosinophils Relative: 3 % (ref 0–5)
Lymphs Abs: 2.1 10*3/uL (ref 0.7–4.0)
MCH: 26.5 pg (ref 26.0–34.0)
MCHC: 31.7 g/dL (ref 30.0–36.0)
MCV: 83.7 fL (ref 78.0–100.0)
Neutrophils Relative %: 56 % (ref 43–77)
Platelets: 268 10*3/uL (ref 150–400)
RBC: 4.71 MIL/uL (ref 3.87–5.11)

## 2012-04-24 LAB — URINE MICROSCOPIC-ADD ON

## 2012-04-24 LAB — COMPREHENSIVE METABOLIC PANEL
ALT: 26 U/L (ref 0–35)
Albumin: 3.9 g/dL (ref 3.5–5.2)
BUN: 6 mg/dL (ref 6–23)
Calcium: 9 mg/dL (ref 8.4–10.5)
GFR calc Af Amer: >90 mL/min (ref >90)
Glucose, Bld: 99 mg/dL (ref 70–99)
Potassium: 3.4 mEq/L — ABNORMAL LOW (ref 3.5–5.1)
Sodium: 138 mEq/L (ref 135–145)
Total Protein: 7.1 g/dL (ref 6.0–8.3)

## 2012-04-24 LAB — URINE CULTURE: Colony Count: 30000

## 2012-04-24 LAB — URINALYSIS, ROUTINE W REFLEX MICROSCOPIC
Ketones, ur: NEGATIVE mg/dL
Nitrite: NEGATIVE
Protein, ur: NEGATIVE mg/dL
Specific Gravity, Urine: 1.03 (ref 1.005–1.030)
Urobilinogen, UA: 0.2 mg/dL (ref 0.0–1.0)

## 2012-04-24 MED ORDER — SUCRALFATE 1 GM/10ML PO SUSP
1.0000 g | Freq: Four times a day (QID) | ORAL | Status: DC
  Administered 2012-04-24 – 2012-04-27 (×12): 1 g via ORAL
  Filled 2012-04-24 (×15): qty 10

## 2012-04-24 MED ORDER — ALBUTEROL SULFATE HFA 108 (90 BASE) MCG/ACT IN AERS: 2.0000 | INHALATION_SPRAY | Freq: Four times a day (QID) | RESPIRATORY_TRACT | Status: DC | PRN

## 2012-04-24 MED ORDER — PANTOPRAZOLE SODIUM 40 MG PO TBEC
40.0000 mg | DELAYED_RELEASE_TABLET | Freq: Two times a day (BID) | ORAL | Status: DC
  Administered 2012-04-24 – 2012-04-25 (×2): 40 mg via ORAL
  Filled 2012-04-24 (×5): qty 1

## 2012-04-24 MED ORDER — PROMETHAZINE HCL 25 MG/ML IJ SOLN
25.0000 mg | Freq: Four times a day (QID) | INTRAMUSCULAR | Status: DC | PRN
  Administered 2012-04-24 – 2012-04-25 (×3): 25 mg via INTRAMUSCULAR
  Filled 2012-04-24 (×4): qty 1

## 2012-04-24 MED ORDER — HYDROMORPHONE HCL PF 1 MG/ML IJ SOLN
0.5000 mg | INTRAMUSCULAR | Status: DC | PRN
  Administered 2012-04-24: 0.5 mg via INTRAVENOUS
  Filled 2012-04-24: qty 1

## 2012-04-24 MED ORDER — PROMETHAZINE HCL 25 MG/ML IJ SOLN
12.5000 mg | Freq: Once | INTRAMUSCULAR | Status: AC
  Administered 2012-04-24: 12.5 mg via INTRAVENOUS
  Filled 2012-04-24: qty 1

## 2012-04-24 MED ORDER — THIAMINE HCL 100 MG/ML IJ SOLN
100.0000 mg | Freq: Every day | INTRAMUSCULAR | Status: DC
  Administered 2012-04-24: 100 mg via INTRAVENOUS
  Filled 2012-04-24: qty 2

## 2012-04-24 MED ORDER — HYDROMORPHONE HCL PF 1 MG/ML IJ SOLN
0.5000 mg | Freq: Once | INTRAMUSCULAR | Status: AC
  Administered 2012-04-24: 0.5 mg via INTRAVENOUS
  Filled 2012-04-24: qty 1

## 2012-04-24 MED ORDER — FAMOTIDINE 20 MG PO TABS
20.0000 mg | ORAL_TABLET | Freq: Two times a day (BID) | ORAL | Status: DC
  Administered 2012-04-24 – 2012-04-27 (×6): 20 mg via ORAL
  Filled 2012-04-24 (×7): qty 1

## 2012-04-24 MED ORDER — SODIUM CHLORIDE 0.9 % IV BOLUS (SEPSIS)
1000.0000 mL | Freq: Once | INTRAVENOUS | Status: AC
  Administered 2012-04-24: 1000 mL via INTRAVENOUS

## 2012-04-24 MED ORDER — HYDROMORPHONE HCL PF 1 MG/ML IJ SOLN
1.0000 mg | INTRAMUSCULAR | Status: DC | PRN
  Administered 2012-04-24: 1 mg via INTRAVENOUS
  Administered 2012-04-24: 0.5 mg via INTRAVENOUS
  Administered 2012-04-24 – 2012-04-25 (×4): 1 mg via INTRAVENOUS
  Filled 2012-04-24 (×6): qty 1

## 2012-04-24 MED ORDER — THIAMINE HCL 100 MG/ML IJ SOLN
100.0000 mg | Freq: Three times a day (TID) | INTRAMUSCULAR | Status: DC
  Administered 2012-04-24 – 2012-04-25 (×2): 100 mg via INTRAVENOUS
  Filled 2012-04-24 (×5): qty 1

## 2012-04-24 MED ORDER — OXYCODONE-ACETAMINOPHEN 5-325 MG/5ML PO SOLN
5.0000 mL | ORAL | Status: DC | PRN
  Administered 2012-04-24 – 2012-04-26 (×6): 10 mL via ORAL
  Filled 2012-04-24 (×6): qty 10

## 2012-04-24 MED ORDER — POTASSIUM CHLORIDE IN NACL 20-0.9 MEQ/L-% IV SOLN
INTRAVENOUS | Status: DC
  Administered 2012-04-24 – 2012-04-27 (×7): via INTRAVENOUS
  Filled 2012-04-24 (×10): qty 1000

## 2012-04-24 MED ORDER — HYDROMORPHONE HCL PF 1 MG/ML IJ SOLN
1.0000 mg | Freq: Once | INTRAMUSCULAR | Status: AC
  Administered 2012-04-24: 1 mg via INTRAVENOUS
  Filled 2012-04-24: qty 1

## 2012-04-24 MED ORDER — ALPRAZOLAM 1 MG PO TABS
1.0000 mg | ORAL_TABLET | Freq: Four times a day (QID) | ORAL | Status: DC | PRN
  Administered 2012-04-24 – 2012-04-27 (×4): 1 mg via ORAL
  Filled 2012-04-24 (×4): qty 1

## 2012-04-24 MED ORDER — ENOXAPARIN SODIUM 40 MG/0.4ML ~~LOC~~ SOLN
40.0000 mg | SUBCUTANEOUS | Status: DC
  Administered 2012-04-24 – 2012-04-26 (×3): 40 mg via SUBCUTANEOUS
  Filled 2012-04-24 (×4): qty 0.4

## 2012-04-24 MED ORDER — TRAZODONE HCL 50 MG PO TABS
50.0000 mg | ORAL_TABLET | Freq: Every day | ORAL | Status: DC
  Administered 2012-04-24: 50 mg via ORAL
  Administered 2012-04-25 – 2012-04-26 (×2): 100 mg via ORAL
  Filled 2012-04-24 (×4): qty 4

## 2012-04-24 MED ORDER — TOPIRAMATE 100 MG PO TABS
100.0000 mg | ORAL_TABLET | Freq: Two times a day (BID) | ORAL | Status: DC
  Administered 2012-04-25 – 2012-04-27 (×5): 100 mg via ORAL
  Filled 2012-04-24 (×8): qty 1

## 2012-04-24 NOTE — ED Notes (Signed)
Patient transported to X-ray 

## 2012-04-24 NOTE — Telephone Encounter (Signed)
She calls saying that she is in excruciating pain and unable to keep food or protein shakes or even liquids down.  She is on maximal ulcer treatments and on roxicet without relief.  I recommended that she come to the ER for evaluation and possible admission if unable to keep the fluids down.

## 2012-04-24 NOTE — ED Notes (Signed)
Pt sts that she is tolerating the juice, but "feels like it is stuck in my throat."  PA notified.

## 2012-04-24 NOTE — H&P (Signed)
[name] is an 37 y.o. female.  HPI: this patient is known to our service for prior Roux-en-Y gastric bypass by Dr. Johna Sheriff a few months ago. This has been complicated by a recently diagnosed anastomotic ulcer. She is currently on maximal medical treatment with PPIs and Carafate. She was scoped about a month ago and was noted to have the ulcers. She continues to have left upper quadrant pain and nausea and vomiting. She called earlier today saying that she was unable to keep anything down including her protein shakes and [name] having heart keeping down any liquids. She is from approximate 3 times yesterday and continues to have left upper quadrant pain which has been refractory to the Roxicet. She says that usually the only thing that helps her symptoms as Dilaudid and Phenergan. She has no exacerbating symptoms. The pain does not radiate. She says her bowels have been functioning normally and denies any blood in the stools or black stools. She denies any fever chills or other associated symptoms.  Past Medical History  Diagnosis Date  . Depression   . Anxiety   . Diabetes mellitus   . GERD (gastroesophageal reflux disease)   . ADD (attention deficit disorder)   . Hyperlipemia   . Herniated disc   . Allergy     Seasonal  . Diabetic neuropathy   . Shortness of breath     with exertion   . Sleep apnea     obsructive settings at 13   . Heart murmur     asa child  . Seizures     hx of / month ago - dehydration   . 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 Cholecystectomy   . Cesarean section   . Tonsillectomy   . Laporoscopy 2007  . Breath tek h pylori 09/11/2011    Procedure: BREATH TEK H PYLORI;  Surgeon: Mariella Saa, MD;  Location: [facility];  Service: General;  Laterality: N/A;  . 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 Bowel resection 11/30/2011    Procedure: SMALL BOWEL RESECTION;  Surgeon: Mariella Saa, MD;  Location: [facility];  Service: General;;  . Esophagogastroduodenoscopy 03/14/2012    Procedure: ESOPHAGOGASTRODUODENOSCOPY (EGD);  Surgeon: Kandis Cocking, MD;  Location: [facility];  Service: General;  Laterality: N/A;    Family History  Problem Relation Age of Onset  . Diabetes type II    . Diabetes Mother   . Hypertension Mother   . Depression Brother   . Cancer Maternal Aunt     ovarian   . Depression Maternal Aunt   . Diabetes Maternal Aunt   . Cancer Maternal Grandmother     Lung  . COPD Maternal Grandmother   . Cancer Maternal Grandfather     Prostate  . Diabetes Maternal Grandfather   . Hearing loss Maternal Grandfather   . Hypertension Maternal Grandfather   . Stroke Maternal Grandfather   . Cancer Paternal Grandmother     Colon    Social History:  reports that she has never smoked. She has never used smokeless tobacco. She reports that she does not drink alcohol or use illicit drugs.  Allergies:  Allergies  Allergen Reactions  . Atarax (Hydroxyzine Hcl) Anaphylaxis  .  Cephalosporins Anaphylaxis  . Codeine Anaphylaxis    Tolerates Dilaudid and Percocet.  . Compazine Anaphylaxis    Tolerates Phenergan.  . Decadron (Dexamethasone) Anaphylaxis  . Demerol Anaphylaxis  . Metoclopramide Hcl Anaphylaxis  . Morphine And Related Anaphylaxis    Tolerates Dilaudid and Percocet.  Marland Kitchen Penicillins Anaphylaxis  . Sulfa Antibiotics Anaphylaxis  . Triptans Anaphylaxis  . Zofran Anaphylaxis  . Pork-Derived Products Other (See Comments)    Religous reasons--per pt ok to take Heparin when medically necessary  . Shellfish-Derived Products Other (See Comments)    Religous reasons    Medications: I have reviewed the patient's current medications.  Results for orders placed during the hospital encounter of  04/24/12 (from the past 48 hour(s))  CBC WITH DIFFERENTIAL     Status: Normal   Collection Time   04/24/12 10:30 AM      Component Value Range Comment   WBC 6.1  4.0 - 10.5 K/uL    RBC 4.71  3.87 - 5.11 MIL/uL    Hemoglobin 12.5  12.0 - 15.0 g/dL    HCT 16.1  09.6 - 04.5 %    MCV 83.7  78.0 - 100.0 fL    MCH 26.5  26.0 - 34.0 pg    MCHC 31.7  30.0 - 36.0 g/dL    RDW 40.9  81.1 - 91.4 %    Platelets 268  150 - 400 K/uL    Neutrophils Relative 56  43 - 77 %    Neutro Abs 3.4  1.7 - 7.7 K/uL    Lymphocytes Relative 35  12 - 46 %    Lymphs Abs 2.1  0.7 - 4.0 K/uL    Monocytes Relative 5  3 - 12 %    Monocytes Absolute 0.3  0.1 - 1.0 K/uL    Eosinophils Relative 3  0 - 5 %    Eosinophils Absolute 0.2  0.0 - 0.7 K/uL    Basophils Relative 1  0 - 1 %    Basophils Absolute 0.0  0.0 - 0.1 K/uL   COMPREHENSIVE METABOLIC PANEL     Status: Abnormal   Collection Time   04/24/12 10:30 AM      Component Value Range Comment   Sodium 138  135 - 145 mEq/L    Potassium 3.4 (*) 3.5 - 5.1 mEq/L DELTA CHECK NOTED   Chloride 99  96 - 112 mEq/L    CO2 29  19 - 32 mEq/L    Glucose, Bld 99  70 - 99 mg/dL    BUN 6  6 - 23 mg/dL    Creatinine, Ser 7.82  0.50 - 1.10 mg/dL    Calcium 9.0  8.4 - 95.6 mg/dL    Total Protein 7.1  6.0 - 8.3 g/dL    Albumin 3.9  3.5 - 5.2 g/dL    AST 19  0 - 37 U/L    ALT 26  0 - 35 U/L    Alkaline Phosphatase 85  39 - 117 U/L    Total Bilirubin 0.3  0.3 - 1.2 mg/dL    GFR calc non Af Amer >90  >90 mL/min    GFR calc Af Amer >90  >90 mL/min   URINALYSIS, ROUTINE W REFLEX MICROSCOPIC     Status: Abnormal   Collection Time   04/24/12 11:02 AM      Component Value Range Comment   Color, Urine AMBER (*) YELLOW BIOCHEMICALS MAY BE AFFECTED BY COLOR   APPearance CLOUDY (*)  CLEAR    Specific Gravity, Urine 1.030  1.005 - 1.030    pH 5.5  5.0 - 8.0    Glucose, UA NEGATIVE  NEGATIVE mg/dL    Hgb urine dipstick SMALL (*) NEGATIVE    Bilirubin Urine SMALL (*) NEGATIVE    Ketones,  ur NEGATIVE  NEGATIVE mg/dL    Protein, ur NEGATIVE  NEGATIVE mg/dL    Urobilinogen, UA 0.2  0.0 - 1.0 mg/dL    Nitrite NEGATIVE  NEGATIVE    Leukocytes, UA SMALL (*) NEGATIVE   URINE MICROSCOPIC-ADD ON     Status: Abnormal   Collection Time   04/24/12 11:02 AM      Component Value Range Comment   Squamous Epithelial / LPF RARE  RARE    WBC, UA 0-2  <3 WBC/hpf    RBC / HPF 3-6  <3 RBC/hpf    Bacteria, UA MANY (*) RARE    Crystals CA OXALATE CRYSTALS (*) NEGATIVE    Urine-Other MUCOUS PRESENT     PREGNANCY, URINE     Status: Normal   Collection Time   04/24/12  1:11 PM      Component Value Range Comment   Preg Test, Ur NEGATIVE  NEGATIVE     No results found.  All other review of systems negative or noncontributory except as stated in the HPI  Blood pressure 120/84, pulse 78, temperature 98.2 F (36.8 C), temperature source Oral, resp. rate 16, height 5\' 7"  (1.702 m), weight 215 lb (97.523 kg), SpO2 99.00%. General appearance: alert, cooperative and no distress Head: Normocephalic, without obvious abnormality, atraumatic Neck: no JVD and supple, symmetrical, trachea midline Resp: nonlabored Cardio: normal rate, regular GI: soft, no significant tenderness except for LUQ tenderness to deep palpation, ND, no peritoneal signs Extremities: extremities normal, atraumatic, no cyanosis or edema Pulses: 2+ and symmetric Skin: Skin color, texture, turgor normal. No rashes or lesions Neurologic: Grossly normal  Assessment/Plan: Nausea and involving and dehydration She has a difficult problem given her anastomotic ulcers. She is currently on maximal medical treatment and has not received much relief with this treatment. However, there is not much else to do for her except for symptomatic relief. But given the fact that she is unable to keep any liquids down and is having pain control issues with oral pain medication, I offered to admit her for overnight observation and hydration. She does not  look significantly dehydrated and her labs are fairly normal. Her abdominal exam does not show any evidence of perforated ulcer or peritoneal signs. We will admit her for observation and symptom control  Harvis Mabus DAVID 04/24/2012, 1:52 PM

## 2012-04-24 NOTE — ED Provider Notes (Signed)
Medical screening examination/treatment/procedure(s) were conducted as a shared visit with non-physician practitioner(s) and myself.  I personally evaluated the patient during the encounter Pt w hx gastric bypass, abd pain, nv. abd soft, mild tenderness. Labs. gen surgery in patient w ed, they plan to admit.   Suzi Roots, MD 04/24/12 [phone]

## 2012-04-24 NOTE — ED Notes (Addendum)
Pt c/o LUQ pain and vomiting x 3 days ago.  Pt sts she was seen at [facility] ED for same complaint x 2 days ago.  Sts pain has increased and she "cant keep anything down."  Pain score 10/10.  Hx of gastric bypass in Sept 2013.  Sts she called Dr. Biagio Quint this AM and was told to come in.  Dx with stomach ulcers(3) Dec 2013.  Vitals are stable.

## 2012-04-24 NOTE — ED Notes (Signed)
Pt alert and oriented x4. Respirations even and unlabored. Bilateral rise and fall of chest. Skin warm and dry. In no acute distress. Pt asking for an ice pack to put on stomach.

## 2012-04-24 NOTE — Progress Notes (Signed)
Pt states dilaudid 0.5mg  not providing enough pain relief. Spoke with Dr Biagio Quint. Pain med increased. Pt requested phenergan be iv verses im .reguest denied. Orders noted.

## 2012-04-24 NOTE — ED Provider Notes (Signed)
History     CSN: [id number]  Arrival date & time 04/24/12  8657   First MD Initiated Contact with Patient 04/24/12 1019      Chief Complaint  Patient presents with  . Abdominal Pain  . Emesis    (Consider location/radiation/quality/duration/timing/severity/associated sxs/prior treatment) HPI Comments: Patient with PMH of GERD and a marginal ulcer at site of gastric bypass presents for abdominal pain in her LUQ which she states is constant and rated a 10/10. Quality of pain is that of "being punched in the stomach". Pain associated with difficulty catching her breath, episodes of nausea and non-bilious, non-bloody emesis. Denies fever, chills, urinary symptoms, diarrhea, and blood in her stool. Patient on maximal ulcer treatment and followed by general surgery for recent gastric bypass. States she was told to come to ED by Dr. Biagio Quint. Note entered by Dr. Biagio Quint today stating should be see for evaluation, fluids, and pain and nausea management; potential admission if patient unable to keep fluids down.  The history is provided by the patient. No language interpreter was used.    Past Medical History  Diagnosis Date  . Depression   . Anxiety   . Diabetes mellitus   . GERD (gastroesophageal reflux disease)   . ADD (attention deficit disorder)   . Hyperlipemia   . Herniated disc   . Allergy     Seasonal  . Diabetic neuropathy   . Shortness of breath     with exertion   . Sleep apnea     obsructive settings at 13   . Heart murmur     asa child  . Seizures     hx of / month ago - dehydration   . 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 Cholecystectomy   . Cesarean section   . Tonsillectomy   . Laporoscopy 2007  . Breath tek h pylori 09/11/2011    Procedure: BREATH TEK H PYLORI;  Surgeon: Mariella Saa, MD;  Location: [facility];  Service: General;  Laterality: N/A;  .  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 Bowel resection 11/30/2011    Procedure: SMALL BOWEL RESECTION;  Surgeon: Mariella Saa, MD;  Location: [facility];  Service: General;;  . Esophagogastroduodenoscopy 03/14/2012    Procedure: ESOPHAGOGASTRODUODENOSCOPY (EGD);  Surgeon: Kandis Cocking, MD;  Location: [facility];  Service: General;  Laterality: N/A;    Family History  Problem Relation Age of Onset  . Diabetes type II    . Diabetes Mother   . Hypertension Mother   . Depression Brother   . Cancer Maternal Aunt     ovarian   . Depression Maternal Aunt   . Diabetes Maternal Aunt   . Cancer Maternal Grandmother     Lung  . COPD Maternal Grandmother   . Cancer Maternal Grandfather     Prostate  . Diabetes Maternal Grandfather   . Hearing loss Maternal Grandfather   . Hypertension Maternal Grandfather   . Stroke Maternal Grandfather   . Cancer Paternal Grandmother     Colon    History  Substance Use Topics  . Smoking status: Never Smoker   . Smokeless tobacco: Never Used  . Alcohol Use: No    OB History  Grav Para Term Preterm Abortions TAB SAB Ect Mult Living                  Review of Systems  Constitutional: Negative for fever and chills.  HENT: Negative for congestion and rhinorrhea.   Eyes: Negative for photophobia and visual disturbance.  Respiratory: Negative for cough and wheezing.   Cardiovascular: Negative for chest pain.  Gastrointestinal: Positive for nausea, vomiting and abdominal pain. Negative for diarrhea and blood in stool.  Genitourinary: Negative for dysuria and hematuria.  Skin: Negative for color change.  Neurological: Negative for dizziness and light-headedness.  All other systems reviewed and are negative.    Allergies  Atarax; Cephalosporins; Codeine; Compazine; Decadron; Demerol; Metoclopramide hcl; Morphine  and related; Penicillins; Sulfa antibiotics; Triptans; Zofran; Pork-derived products; and Shellfish-derived products  Home Medications   Current Outpatient Rx  Name  Route  Sig  Dispense  Refill  . ALBUTEROL SULFATE HFA 108 (90 BASE) MCG/ACT IN AERS   Inhalation   Inhale 2 puffs into the lungs every 6 (six) hours as needed. For shortness of breath.         . ALPRAZOLAM 1 MG PO TABS   Oral   Take 1 mg by mouth 4 (four) times daily as needed. For anxiety         . BEPOTASTINE BESILATE 1.5 % OP SOLN   Ophthalmic   Apply 1 drop to eye 3 (three) times daily as needed. Itching/allergies         . CALCIUM CARBONATE-VITAMIN D 500-200 MG-UNIT PO TABS   Oral   Take 1 tablet by mouth daily.         . CYCLOBENZAPRINE HCL 10 MG PO TABS   Oral   Take 10 mg by mouth 3 (three) times daily as needed. For headaches         . FAMOTIDINE 20 MG PO TABS   Oral   Take 1 tablet (20 mg total) by mouth 2 (two) times daily.   20 tablet   0   . KETOCONAZOLE 2 % EX SHAM   Topical   Apply 1 application topically daily as needed. For scalp itching/pain.         . METHYLPHENIDATE HCL ER 25 MG/5ML PO SUSR   Oral   Take 12 mLs by mouth daily.         . ADULT GUMMY PO CHEW   Oral   Chew 2 tablets by mouth daily.         . OXYCODONE-ACETAMINOPHEN 5-325 MG/5ML PO SOLN   Oral   Take 5-10 mLs by mouth every 4 (four) hours as needed. For pain         . OXYCODONE-ACETAMINOPHEN 5-325 MG/5ML PO SOLN   Oral   Take 5 mLs by mouth every 6 (six) hours as needed for pain.   500 mL   0   . PANTOPRAZOLE SODIUM 40 MG PO TBEC   Oral   Take 40 mg by mouth 2 times daily at 12 noon and 4 pm. Takes at 12p and 4p         . PERPHENAZINE 4 MG PO TABS   Oral   Take 8 mg by mouth 3 (three) times daily as needed. Nausea/vomiting         . PROMETHAZINE HCL 25 MG/ML IJ SOLN   Intravenous   Inject 25 mg into the vein 4 (four) times daily as needed.         Marland Kitchen  SUCRALFATE 1 GM/10ML PO SUSP    Oral   Take 1 g by mouth every 6 (six) hours.         . TOPIRAMATE 25 MG PO CPSP   Oral   Take 100 mg by mouth 2 (two) times daily. Open capsules and mix in food/drink and consume all at once         . TRAZODONE HCL 50 MG PO TABS   Oral   Take 50-200 mg by mouth at bedtime.         Marland Kitchen VITAMIN B-12 1000 MCG PO TABS   Oral   Take 2,500 mcg by mouth at bedtime.           BP 120/84  Pulse 78  Temp 98.2 F (36.8 C) (Oral)  Resp 16  Ht 5\' 7"  (1.702 m)  Wt 215 lb (97.523 kg)  BMI 33.67 kg/m2  SpO2 99%  Physical Exam  Constitutional: She appears well-developed and well-nourished. No distress.  HENT:  Head: Normocephalic and atraumatic.  Eyes: Pupils are equal, round, and reactive to light. No scleral icterus.  Neck: Normal range of motion.  Cardiovascular: Normal rate, regular rhythm and normal heart sounds.   Pulmonary/Chest: Effort normal and breath sounds normal. No respiratory distress.  Abdominal: Soft. Bowel sounds are normal. She exhibits no distension. There is tenderness. There is no rebound and no guarding.       Tender on palpation of LUQ  Musculoskeletal: Normal range of motion.  Lymphadenopathy:    She has no cervical adenopathy.  Neurological: She is alert. Coordination normal.  Skin: No rash noted. She is not diaphoretic. No erythema.  Psychiatric: She has a normal mood and affect. Her behavior is normal.    ED Course  Procedures (including critical care time)  Labs Reviewed  URINALYSIS, ROUTINE W REFLEX MICROSCOPIC - Abnormal; Notable for the following:    Color, Urine AMBER (*)  BIOCHEMICALS MAY BE AFFECTED BY COLOR   APPearance CLOUDY (*)     Hgb urine dipstick SMALL (*)     Bilirubin Urine SMALL (*)     Leukocytes, UA SMALL (*)     All other components within normal limits  COMPREHENSIVE METABOLIC PANEL - Abnormal; Notable for the following:    Potassium 3.4 (*)  DELTA CHECK NOTED   All other components within normal limits  URINE  MICROSCOPIC-ADD ON - Abnormal; Notable for the following:    Bacteria, UA MANY (*)     Crystals CA OXALATE CRYSTALS (*)     All other components within normal limits  CBC WITH DIFFERENTIAL  PREGNANCY, URINE  URINE CULTURE   No results found.   1. Other and unspecified postsurgical nonabsorption   2. Follow-up examination, following other surgery   3. 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 Marginal ulcer       MDM  Patient presents c/o pain in her LUQ likely 2/2 marginal ulcer at site of her roux-en-y bypass for which she is on maximal ulcer treatment. Was seen in ED 2 days ago for similar complaint. Was told by Dr. Biagio Quint to come to ED for evaluation and possible admission if she is unable to keep down fluids. Will perform full work up in light of possible admission. Have also ordered IVF, phenergan for nausea and hydromorphone for pain; patient states she "can only take dilaudid for pain". Will continue to monitor.  Per nurse, patient asks how much pain medicine she is getting and states that  the dose will not be high enough to help her. Have ordered 1mg  IV dilaudid to help with pain. Have also discussed with nurse giving patient PO fluids to see if she can keep the fluids down without having an episode of emesis. Patient will likely be discharged home if can tolerate fluids PO.  Dr. Biagio Quint came to evaluate patient and is going to admit to hospital for further management.         Antony Madura, PA-C 04/24/12 1410

## 2012-04-24 NOTE — ED Notes (Signed)
Pt given cranberry juice, per PA.

## 2012-04-25 ENCOUNTER — Encounter (HOSPITAL_COMMUNITY): Payer: Self-pay

## 2012-04-25 ENCOUNTER — Encounter (HOSPITAL_COMMUNITY)
Admission: EM | Disposition: A | Discharge status: Home / Self Care | Payer: Self-pay | Source: Home / Self Care | Attending: General Surgery

## 2012-04-25 DIAGNOSIS — Z9884 Bariatric surgery status: Secondary | ICD-10-CM | POA: Diagnosis not present

## 2012-04-25 DIAGNOSIS — K912 Postsurgical malabsorption, not elsewhere classified: Secondary | ICD-10-CM | POA: Diagnosis not present

## 2012-04-25 DIAGNOSIS — E860 Dehydration: Secondary | ICD-10-CM | POA: Diagnosis not present

## 2012-04-25 DIAGNOSIS — R112 Nausea with vomiting, unspecified: Secondary | ICD-10-CM | POA: Diagnosis not present

## 2012-04-25 HISTORY — PX: ESOPHAGOGASTRODUODENOSCOPY: SHX5428

## 2012-04-25 LAB — URINE CULTURE: Colony Count: >=100000

## 2012-04-25 LAB — BASIC METABOLIC PANEL
BUN: 4 mg/dL — ABNORMAL LOW (ref 6–23)
Calcium: 8.1 mg/dL — ABNORMAL LOW (ref 8.4–10.5)
Creatinine, Ser: 0.57 mg/dL (ref 0.50–1.10)
GFR calc Af Amer: >90 mL/min (ref >90)
GFR calc non Af Amer: >90 mL/min (ref >90)
Glucose, Bld: 104 mg/dL — ABNORMAL HIGH (ref 70–99)
Potassium: 3.9 mEq/L (ref 3.5–5.1)

## 2012-04-25 SURGERY — EGD (ESOPHAGOGASTRODUODENOSCOPY)
Anesthesia: Moderate Sedation

## 2012-04-25 MED ORDER — PROMETHAZINE HCL 25 MG/ML IJ SOLN
12.5000 mg | Freq: Four times a day (QID) | INTRAMUSCULAR | Status: DC | PRN
  Administered 2012-04-25 – 2012-04-27 (×6): 12.5 mg via INTRAVENOUS
  Filled 2012-04-25 (×7): qty 1

## 2012-04-25 MED ORDER — FENTANYL CITRATE 0.05 MG/ML IJ SOLN
INTRAMUSCULAR | Status: DC | PRN
  Administered 2012-04-25 (×3): 25 ug via INTRAVENOUS

## 2012-04-25 MED ORDER — FENTANYL CITRATE 0.05 MG/ML IJ SOLN
INTRAMUSCULAR | Status: AC
  Filled 2012-04-25: qty 2

## 2012-04-25 MED ORDER — MENTHOL 3 MG MT LOZG
1.0000 | LOZENGE | OROMUCOSAL | Status: DC | PRN
  Filled 2012-04-25: qty 9

## 2012-04-25 MED ORDER — MIDAZOLAM HCL 10 MG/2ML IJ SOLN
INTRAMUSCULAR | Status: DC | PRN
  Administered 2012-04-25 (×3): 2 mg via INTRAVENOUS

## 2012-04-25 MED ORDER — MIDAZOLAM HCL 10 MG/2ML IJ SOLN
INTRAMUSCULAR | Status: AC
  Filled 2012-04-25: qty 2

## 2012-04-25 MED ORDER — HYDROMORPHONE HCL PF 1 MG/ML IJ SOLN
1.0000 mg | INTRAMUSCULAR | Status: DC | PRN
  Administered 2012-04-25 – 2012-04-27 (×15): 1 mg via INTRAVENOUS
  Filled 2012-04-25 (×15): qty 1

## 2012-04-25 MED ORDER — BUTAMBEN-TETRACAINE-BENZOCAINE 2-2-14 % EX AERO
INHALATION_SPRAY | CUTANEOUS | Status: DC | PRN
  Administered 2012-04-25: 2 via TOPICAL

## 2012-04-25 MED ORDER — SODIUM CHLORIDE 0.9 % IV SOLN
8.0000 mg/h | INTRAVENOUS | Status: DC
  Administered 2012-04-25 – 2012-04-27 (×3): 8 mg/h via INTRAVENOUS
  Filled 2012-04-25 (×10): qty 80

## 2012-04-25 NOTE — Interval H&P Note (Signed)
History and Physical Interval Note:  04/25/2012 4:22 PM  [name]  has presented today for surgery, with the diagnosis of history of marginal ulcer  The various methods of treatment have been discussed with the patient and family.   Seen by Dr. Johna Sheriff earlier today.  Agree with planned repeat endo, since she is hospitalized.  Mother at bedside.  After consideration of risks, benefits and other options for treatment, the patient has consented to  Procedure(s) (LRB) with comments: ESOPHAGOGASTRODUODENOSCOPY (EGD) (N/A) as a surgical intervention .    The patient's history has been reviewed, patient examined, no change in status, stable for surgery.  I have reviewed the patient's chart and labs.  Questions were answered to the patient's satisfaction.     [name]

## 2012-04-25 NOTE — Care Management Note (Signed)
    Page 1 of 1   04/25/2012     12:20:08 PM   CARE MANAGEMENT NOTE 04/25/2012  Patient:  [name], [name]   Account Number:  [id number]  Date Initiated:  04/25/2012  Documentation initiated by:  Lorenda Ishihara  Subjective/Objective Assessment:   37 yo female admitted with nausea and vomiting, s/p gastric bypass and anastimotic ulcers. PTA lived at home with spouse.     Action/Plan:   Home when stable   Anticipated DC Date:  04/27/2012   Anticipated DC Plan:  HOME/SELF CARE      DC Planning Services  CM consult      Choice offered to / List presented to:             Status of service:  In process, will continue to follow Medicare Important Message given?   (If response is "NO", the following Medicare IM given date fields will be blank) Date Medicare IM given:   Date Additional Medicare IM given:    Discharge Disposition:  HOME/SELF CARE  Per UR Regulation:  Reviewed for med. necessity/level of care/duration of stay  If discussed at Long Length of Stay Meetings, dates discussed:    Comments:

## 2012-04-25 NOTE — Progress Notes (Signed)
Patient ID: [name], female   DOB: 09/06/1975, 36 y.o.   MRN: [id number]    Subjective: Still with epigastric and LUQ abdominal pain, no real change just partly relieved with pain meds. Keeping CLs and meds down OK  Objective: Vital signs in last 24 hours: Temp:  [97.7 F (36.5 C)-98.3 F (36.8 C)] 97.9 F (36.6 C) (02/03 0603) Pulse Rate:  [62-78] 71  (02/03 0603) Resp:  [16-18] 18  (02/03 0603) BP: (104-123)/(73-96) 107/75 mmHg (02/03 0603) SpO2:  [92 %-99 %] 92 % (02/03 0603) Last BM Date: 04/23/12  Intake/Output from previous day: 02/02 0701 - 02/03 0700 In: 2450.4 [P.O.:840; I.V.:1610.4] Out: 677 [Urine:677] Intake/Output this shift: Total I/O In: 746.3 [P.O.:240; I.V.:506.3] Out: 400 [Urine:400]  General appearance: alert, cooperative and a little drowsy GI: abnormal findings:  moderate tenderness in the epigastrium and without guarding  Lab Results:   Basename 04/24/12 1030 04/22/12 1940  WBC 6.1 8.2  HGB 12.5 12.7  HCT 39.4 39.8  PLT 268 265   BMET  Basename 04/25/12 0520 04/24/12 1030  NA 139 138  K 3.9 3.4*  CL 105 99  CO2 26 29  GLUCOSE 104* 99  BUN 4* 6  CREATININE 0.57 0.62  CALCIUM 8.1* 9.0     Studies/Results: Dg Abd Acute W/chest  04/24/2012  *RADIOLOGY REPORT*  Clinical Data: Nausea and vomiting.  ACUTE ABDOMEN SERIES (ABDOMEN 2 VIEW & CHEST 1 VIEW)  Comparison: CT 04/07/2012 and earlier studies  Findings: Lungs clear.  Heart size upper limits normal.  Vascular clips in the right upper abdomen.  No free air.  Anastomotic staples in the left upper abdomen.  IUD projects in the uterus. Normal bowel gas pattern.  Regional bones unremarkable.  IMPRESSION:  1.  Normal bowel gas pattern. 2.  No free air. 3.  No acute cardiopulmonary disease.   Original Report Authenticated By: D. Hassell III, MD     Anti-infectives: Anti-infectives    None      Assessment/Plan: Abdominal pain, marginal ulcer.  Admitted due to inability to control Sxs as an  outpatient Have changed protonix to infusion Will repeat endoscopy to assess ulcer Stable    LOS: 1 day    [name] T 04/25/2012   

## 2012-04-25 NOTE — H&P (View-Only) (Signed)
Patient ID: [name], female   DOB: 09/04/1975, 37 y.o.   MRN: [id number]    Subjective: Still with epigastric and LUQ abdominal pain, no real change just partly relieved with pain meds. Keeping CLs and meds down OK  Objective: Vital signs in last 24 hours: Temp:  [97.7 F (36.5 C)-98.3 F (36.8 C)] 97.9 F (36.6 C) (02/03 0603) Pulse Rate:  [62-78] 71  (02/03 0603) Resp:  [16-18] 18  (02/03 0603) BP: (104-123)/(73-96) 107/75 mmHg (02/03 0603) SpO2:  [92 %-99 %] 92 % (02/03 0603) Last BM Date: 04/23/12  Intake/Output from previous day: 02/02 0701 - 02/03 0700 In: 2450.4 [P.O.:840; I.V.:1610.4] Out: 677 [Urine:677] Intake/Output this shift: Total I/O In: 746.3 [P.O.:240; I.V.:506.3] Out: 400 [Urine:400]  General appearance: alert, cooperative and a little drowsy GI: abnormal findings:  moderate tenderness in the epigastrium and without guarding  Lab Results:   [facility] 04/24/12 1030 04/22/12 1940  WBC 6.1 8.2  HGB 12.5 12.7  HCT 39.4 39.8  PLT 268 265   BMET  Basename 04/25/12 0520 04/24/12 1030  NA 139 138  K 3.9 3.4*  CL 105 99  CO2 26 29  GLUCOSE 104* 99  BUN 4* 6  CREATININE 0.57 0.62  CALCIUM 8.1* 9.0     Studies/Results: Dg Abd Acute W/chest  04/24/2012  *RADIOLOGY REPORT*  Clinical Data: Nausea and vomiting.  ACUTE ABDOMEN SERIES (ABDOMEN 2 VIEW & CHEST 1 VIEW)  Comparison: CT 04/07/2012 and earlier studies  Findings: Lungs clear.  Heart size upper limits normal.  Vascular clips in the right upper abdomen.  No free air.  Anastomotic staples in the left upper abdomen.  IUD projects in the uterus. Normal bowel gas pattern.  Regional bones unremarkable.  IMPRESSION:  1.  Normal bowel gas pattern. 2.  No free air. 3.  No acute cardiopulmonary disease.   Original Report Authenticated By: D. Andria Rhein, MD     Anti-infectives: Anti-infectives    None      Assessment/Plan: Abdominal pain, marginal ulcer.  Admitted due to inability to control Sxs as an  outpatient Have changed protonix to infusion Will repeat endoscopy to assess ulcer Stable    LOS: 1 day    [name] T 04/25/2012

## 2012-04-25 NOTE — Op Note (Signed)
04/24/2012 - 04/25/2012  5:01 PM  PATIENT:  [name], 37 y.o., female, MRN: [id number]  PREOP DIAGNOSIS:  History of marginal ulcer, persistant pain and nausea  POSTOP DIAGNOSIS:   Probable small posterior marginal ulcer - but improved over 03/14/12 endoscopy.  RYGB anatomy otherwise normal.  PROCEDURE:  Esophagogastrojejunoscopy  SURGEON:   Ovidio Kin, M.D.  ANESTHESIA:   Fentanyl  6 mcg   Versed 75 mg  INDICATIONS FOR PROCEDURE:  [name] is a 37 y.o. (DOB: Sep 07, 1975)  white female whose primary care physician is [facility], MD and comes for upper endoscopy to evaluate epigastric pain and nausea.  The patient had a RYGB on 9/9/2013by Dr. Johna Sheriff.   The indications and risks of the endoscopy were explained to the patient.  The risks include, but are not limited to, perforation, bleeding, or injury to the bowel.  If balloon dilatation is needed, the risk of perforation is higher.  PROCEDURE:  The patient was in room 4 at [facility] endoscopy unit.  The patient was monitored with a pulse oximetry, BP cuff, and EKG.  The patient has nasal O2 flowing during the procedure.   The back of the throat was anesthestized with Ceticaine x 3.  The patient was positioned in the left lateral decubitus position.  The patient was given Fentanyl and Versed.  A flexible Pentax endoscope was passed down the throat without difficulty.   Findings include:   Esophagus:   Normal   GE junction at:  40 cm   Stomach pouch: Normal   Gastrojejunal anastomosis:   44 cm.  The ulcers that I saw last time are clearly better.  The anterior ulcer appears to have resolved.  There is probably a small remnant of the post ulcer.  The has some edema at the GU anastomosis, not entirely sure why, but the endoscope goes through without difficulty.   Efferent jejunal limb:  Normal.  I went down 20+ cm. Afferent jejunal limb:  Normal   CLO test:  Obtained, results pending.  PLAN:   Photos taken and given to  patient.    The patient is admitted to [facility].  Her ulcers appear to be improving on the current treatment.  The degree of her symptoms are hard to explain.  I gave her mother copies of the pictures that I took this time and last time. Ovidio Kin, MD, [facility] Surgery Pager: [phone] Office phone:  [phone]

## 2012-04-26 ENCOUNTER — Encounter (HOSPITAL_COMMUNITY): Payer: Self-pay | Admitting: Surgery

## 2012-04-26 LAB — CLOTEST (H. PYLORI), BIOPSY: Helicobacter screen: NEGATIVE

## 2012-04-26 NOTE — Progress Notes (Signed)
Patient ID: [name], female   DOB: [birth year], 37 y.o.   MRN: [id number] 1 Day Post-Op  Subjective: Patient still with epigastric pain and also nauseated but no vomiting.  Objective: Vital signs in last 24 hours: Temp:  [97.7 F (36.5 C)-98.9 F (37.2 C)] 97.7 F (36.5 C) (02/04 6962) Pulse Rate:  [64-90] 65  (02/04 0632) Resp:  [10-20] 16  (02/04 9528) BP: (83-138)/(28-79) 114/67 mmHg (02/04 0632) SpO2:  [97 %-100 %] 98 % (02/04 0632) Last BM Date: 04/26/12  Intake/Output from previous day: 02/03 0701 - 02/04 0700 In: 4198.8 [P.O.:1180; I.V.:3018.8] Out: 2352 [Urine:2350; Stool:2] Intake/Output this shift: Total I/O In: -  Out: 650 [Urine:650]  General appearance: cooperative, no distress and drowsy GI: abnormal findings:  moderate tenderness in the epigastrium  Lab Results:   Basename 04/24/12 1030  WBC 6.1  HGB 12.5  HCT 39.4  PLT 268   BMET  Basename 04/25/12 0520 04/24/12 1030  NA 139 138  K 3.9 3.4*  CL 105 99  CO2 26 29  GLUCOSE 104* 99  BUN 4* 6  CREATININE 0.57 0.62  CALCIUM 8.1* 9.0     Studies/Results: Dg Abd Acute W/chest  04/24/2012  *RADIOLOGY REPORT*  Clinical Data: Nausea and vomiting.  ACUTE ABDOMEN SERIES (ABDOMEN 2 VIEW & CHEST 1 VIEW)  Comparison: CT 04/07/2012 and earlier studies  Findings: Lungs clear.  Heart size upper limits normal.  Vascular clips in the right upper abdomen.  No free air.  Anastomotic staples in the left upper abdomen.  IUD projects in the uterus. Normal bowel gas pattern.  Regional bones unremarkable.  IMPRESSION:  1.  Normal bowel gas pattern. 2.  No free air. 3.  No acute cardiopulmonary disease.   Original Report Authenticated By: D. Andria Rhein, MD     Anti-infectives: Anti-infectives    None      Assessment/Plan: s/p Procedure(s): ESOPHAGOGASTRODUODENOSCOPY (EGD) Marginal ulcer, abdominal pain and nausea post bypass  EGD has shown her ulcers are significantly improved. I do not have a good  explanation of why she is not feeling any better. I discussed this at length with the patient today. I told her I think we should be encouraged that we see there is significant healing. She has had extensive workup including several CT scans and no other abnormality identified.  She will try to get up and be more active today and see how she does on liquid diet. If this is tolerated we will shoot to get her home tomorrow.   LOS: 2 days    Kaden Daughdrill T 04/26/2012

## 2012-04-27 MED ORDER — OXYCODONE-ACETAMINOPHEN 5-325 MG/5ML PO SOLN: 5.0000 mL | ORAL | Status: DC | PRN

## 2012-04-27 NOTE — Progress Notes (Signed)
Pt stated had episode of vomit did not witness and no evidence of the vomit advised patient to save emesis next time. Jayvion Stefanski RN

## 2012-04-27 NOTE — Progress Notes (Signed)
2 Days Post-Op  Subjective: Still with some of her baseline symptoms but feels "ready to go home"  Objective: Vital signs in last 24 hours: Temp:  [98.1 F (36.7 C)-98.6 F (37 C)] 98.1 F (36.7 C) (02/05 0540) Pulse Rate:  [73-81] 78  (02/05 0540) Resp:  [16-18] 18  (02/05 0540) BP: (103-117)/(57-76) 111/62 mmHg (02/05 0540) SpO2:  [93 %-95 %] 93 % (02/05 0540) Last BM Date: 04/26/12  Intake/Output from previous day: 02/04 0701 - 02/05 0700 In: 3459.2 [P.O.:480; I.V.:2979.2] Out: 4450 [Urine:4450] Intake/Output this shift: Total I/O In: 240 [P.O.:240] Out: 400 [Urine:400]  General appearance: alert, cooperative and no distress Resp: nonlabored Cardio: normal rate, regular GI: soft, mild LUQ tenderness to deep palpation, ND, no peritoneal signs Extremities: SCD's bilat  Lab Results:  No results found for this basename: WBC:2,HGB:2,HCT:2,PLT:2 in the last 72 hours BMET  [facility] 04/25/12 0520  NA 139  K 3.9  CL 105  CO2 26  GLUCOSE 104*  BUN 4*  CREATININE 0.57  CALCIUM 8.1*   PT/INR No results found for this basename: LABPROT:2,INR:2 in the last 72 hours ABG No results found for this basename: PHART:2,PCO2:2,PO2:2,HCO3:2 in the last 72 hours  Studies/Results: No results found.  Anti-infectives: Anti-infectives    None      Assessment/Plan: s/p Procedure(s) (LRB) with comments: ESOPHAGOGASTRODUODENOSCOPY (EGD) (N/A) Advance diet She seems to be doing okay.  She has been tolerating liquids and she feels ready to go home.  will try to advance diet and plan for discharge to home later today if still doing well  LOS: 3 days    Lodema Pilot DAVID 04/27/2012

## 2012-04-27 NOTE — Progress Notes (Signed)
Discharge instructions reviewed with patient, patient to follow up with MD, tolerated advanced bariatric diet, no emesis, prescription given, questions and concerns answered Stanford Breed RN 04-27-2012 12:46pm

## 2012-04-29 DIAGNOSIS — T7840XA Allergy, unspecified, initial encounter: Secondary | ICD-10-CM | POA: Diagnosis not present

## 2012-05-03 DIAGNOSIS — L259 Unspecified contact dermatitis, unspecified cause: Secondary | ICD-10-CM | POA: Diagnosis not present

## 2012-05-03 DIAGNOSIS — L989 Disorder of the skin and subcutaneous tissue, unspecified: Secondary | ICD-10-CM | POA: Diagnosis not present

## 2012-05-04 ENCOUNTER — Encounter: Payer: Self-pay | Admitting: *Deleted

## 2012-05-04 DIAGNOSIS — E119 Type 2 diabetes mellitus without complications: Secondary | ICD-10-CM | POA: Diagnosis not present

## 2012-05-04 DIAGNOSIS — L659 Nonscarring hair loss, unspecified: Secondary | ICD-10-CM | POA: Diagnosis not present

## 2012-05-05 NOTE — Discharge Summary (Signed)
Patient ID: [name] [id number] 36 y.o. 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 She initially did very well but approximately 6 weeks ago developed persistent left upper quadrant abdominal pain. Endoscopy at that time showed significant marginal ulcers 2 adjacent "kissing" 1 cm ulcers. She has been on Protonix and Carafate but has had persistent problems with left upper quadrant pain and vomiting. She was admitted due to persistent and worsening symptoms of left upper quadrant pain and intolerance to diet.  Hospital Course: the patient was admitted and treated with IV hydration, parenteral pain medication as well as IV Protonix. Due to apparent worsening symptoms we proceeded with repeat endoscopy by Dr. Ezzard Standing. This actually showed significant improvement in her ulcers with one ulcer being completely healed and the other about 50% healed. She was treated symptomatically and showed enough improvement that she was felt ready for discharge on 04/27/2012. She is tolerating her diet and pain is reasonably well controlled on oral medications.  Consults: None  Significant Diagnostic Studies: upper GI endoscopy results as above   Disposition: Home  Patient Instructions:    Medication List    TAKE these medications       ADULT GUMMY Chew  Chew 2 tablets by mouth daily.     albuterol 108 (90 BASE) MCG/ACT inhaler   Commonly known as:  PROVENTIL HFA;VENTOLIN HFA  Inhale 2 puffs into the lungs every 6 (six) hours as needed. For shortness of breath.     ALPRAZolam 1 MG tablet  Commonly known as:  XANAX  Take 1 mg by mouth 4 (four) times daily as needed. For anxiety     BEPREVE 1.5 % Soln  Generic drug:  Bepotastine Besilate  Apply 1 drop to eye 3 (three) times daily as needed. Itching/allergies     calcium-vitamin D 500-200 MG-UNIT per tablet  Commonly known as:  OSCAL WITH D  Take 1 tablet by mouth daily.     cyclobenzaprine 10 MG tablet  Commonly known as:  FLEXERIL  Take 10 mg by mouth 3 (three) times daily as needed. For headaches     famotidine 20 MG tablet  Commonly known as:  PEPCID  Take 1 tablet (20 mg total) by mouth 2 (two) times daily.     ketoconazole 2 % shampoo  Commonly known as:  NIZORAL  Apply 1 application topically daily as needed. For scalp itching/pain.     oxyCODONE-acetaminophen 5-325 MG/5ML solution  Commonly known as:  ROXICET  Take 5-10 mLs by mouth every 4 (four) hours as needed. For pain     oxyCODONE-acetaminophen 5-325 MG/5ML solution  Commonly known as:  ROXICET  Take 5 mLs by mouth every 6 (six) hours as needed for pain.     oxyCODONE-acetaminophen 5-325 MG/5ML solution  Commonly known as:  ROXICET  Take 5-10 mLs by mouth every 4 (four) hours as needed.     pantoprazole 40 MG tablet  Commonly known as:  PROTONIX  Take 40 mg by mouth 2 times daily at 12 noon and 4 pm. Takes at 12p and 4p  perphenazine 4 MG tablet  Commonly known as:  TRILAFON  Take 8 mg by mouth 3 (three) times daily as needed. Nausea/vomiting     promethazine 25 MG/ML injection  Commonly known as:  PHENERGAN  Inject 25 mg into the vein 4 (four) times daily as needed.     QUILLIVANT XR 25 MG/5ML Susr  Generic drug:  Methylphenidate HCl ER  Take 12 mLs by mouth daily.     sucralfate 1 GM/10ML suspension  Commonly known as:  CARAFATE  Take 1 g by mouth every 6 (six) hours.      topiramate 25 MG capsule  Commonly known as:  TOPAMAX  Take 100 mg by mouth 2 (two) times daily. Open capsules and mix in food/drink and consume all at once     traZODone 50 MG tablet  Commonly known as:  DESYREL  Take 50-200 mg by mouth at bedtime.     vitamin B-12 1000 MCG tablet  Commonly known as:  CYANOCOBALAMIN  Take 2,500 mcg by mouth at bedtime.        Activity: activity as tolerated Diet: bariatric regular diet Wound Care: none needed  Follow-up:  With Dr. Johna Sheriff in 3 week.  Signed: Mariella Saa MD, FACS  05/05/2012, 9:28 AM

## 2012-05-06 NOTE — Progress Notes (Signed)
[name] stopped by for repeat Tanita scan.   Surgery date: 11/30/11 Surgery type: RYGB Start weight @ [facility]: 290.5 lbs (09/04/11)  Weight today: 207.5 lbs Weight change: 8.5 lbs Total weight lost: 83.0 lbs BMI: 32.5 kg/m^2  Weight loss goal: 175 lbs % goal met: 72%  TANITA BODY COMP RESULTS   09/04/11  12/15/11  12/29/11 01/26/12 02/25/12 04/07/12 05/04/12  Fat Mass (lbs)  157.0  135.0  133.5 124.0 112.0 98.5 91.5  Fat Free Mass (lbs)  133.5  134.5  122.5 125.5 123.5 117.5 116.0  Total Body Water (lbs)  97.5  98.5  89.5 92.0 90.5 86.0 85.0

## 2012-05-13 ENCOUNTER — Telehealth (INDEPENDENT_AMBULATORY_CARE_PROVIDER_SITE_OTHER): Payer: Self-pay | Admitting: *Deleted

## 2012-05-13 NOTE — Telephone Encounter (Signed)
Patient called to states she has not been feeling well for the last couple of weeks.  Patient states she is going this afternoon to have labs drawn.  Just an [place]

## 2012-05-16 LAB — IRON AND TIBC
Iron: 41 ug/dL — ABNORMAL LOW (ref 42–145)
UIBC: 308 ug/dL (ref 125–400)

## 2012-05-16 LAB — FOLATE: Folate: 12.8 ng/mL

## 2012-05-16 LAB — LIPID PANEL
LDL Cholesterol: 129 mg/dL — ABNORMAL HIGH (ref 0–99)
VLDL: 28 mg/dL (ref 0–40)

## 2012-05-17 ENCOUNTER — Telehealth (INDEPENDENT_AMBULATORY_CARE_PROVIDER_SITE_OTHER): Payer: Self-pay

## 2012-05-17 ENCOUNTER — Telehealth (INDEPENDENT_AMBULATORY_CARE_PROVIDER_SITE_OTHER): Payer: Self-pay | Admitting: General Surgery

## 2012-05-17 NOTE — Telephone Encounter (Signed)
Pt called again at 11:45 to check on status of call at 10:00.  Asked for Dr. Johna Sheriff call her on her cell [phone].

## 2012-05-17 NOTE — Telephone Encounter (Signed)
Patient calling in stating she has been having trouble with full body itching since being released from the hospital. She has tried benadryl, oatmeal baths, calamine lotion, hydrocortisone cream, dexamethasone. She has seen a dermatologist and her internist re: this problem. She is asking for an okay for a one time depo medrol injection to help with this itching - but since she has problems with ulcers she needs an okay from Dr Johna Sheriff. Please advise. She also wanted to let us know she went for her blood work yesterday and she is still having pain from her ulcer.

## 2012-05-17 NOTE — Telephone Encounter (Signed)
Return call to patient. Dr. Johna Sheriff does not recommend patient receiving injection for Depo Medrol (steroid) injection.  Patient still has a healing ulcer.  Dr. Johna Sheriff will discuss with patient in more detail at the time of her office visit on 05/19/12 @ 12:00 pm.

## 2012-05-17 NOTE — Telephone Encounter (Signed)
The pt called back regarding her previous call.  She is having unbearable itching that she has seen her dermatologist for.  They want to give her an injection for the itching.  They aren't sure what is the cause of it.  She hasn't changed her soaps, lotions or detergents.  Neysa Bonito is aware of her call and Dr Johna Sheriff will be paged.

## 2012-05-17 NOTE — Telephone Encounter (Signed)
Message copied by Maryan Puls on Tue May 17, 2012  4:05 PM ------      Message from: Zacarias Pontes      Created: Tue May 17, 2012 10:58 AM       pls return pts call on her cell phone at [phone] ------

## 2012-05-17 NOTE — Telephone Encounter (Signed)
Pt returned call; she was slurring her words and had to repeat her name and reason for calling.  Gave her Dr. Jamse Mead response to the inquiry about steroids and will discuss further at appt on Thursday.

## 2012-05-19 ENCOUNTER — Telehealth (INDEPENDENT_AMBULATORY_CARE_PROVIDER_SITE_OTHER): Payer: Self-pay

## 2012-05-19 ENCOUNTER — Ambulatory Visit (INDEPENDENT_AMBULATORY_CARE_PROVIDER_SITE_OTHER): Payer: BC Managed Care – PPO | Admitting: General Surgery

## 2012-05-19 ENCOUNTER — Encounter (INDEPENDENT_AMBULATORY_CARE_PROVIDER_SITE_OTHER): Payer: Self-pay | Admitting: General Surgery

## 2012-05-19 VITALS — BP 128/76 | HR 92 | Temp 97.8°F | Resp 18 | Ht 67.0 in | Wt 205.0 lb

## 2012-05-19 DIAGNOSIS — Z6832 Body mass index (BMI) 32.0-32.9, adult: Secondary | ICD-10-CM

## 2012-05-19 DIAGNOSIS — K912 Postsurgical malabsorption, not elsewhere classified: Secondary | ICD-10-CM | POA: Insufficient documentation

## 2012-05-19 LAB — CBC WITH DIFFERENTIAL/PLATELET
HCT: 40.5 % (ref 36.0–46.0)
Lymphocytes Relative: 26 % (ref 12–46)
Lymphs Abs: 2.9 10*3/uL (ref 0.7–4.0)
MCV: 83.3 fL (ref 78.0–100.0)
Monocytes Absolute: 0.5 10*3/uL (ref 0.1–1.0)
Neutro Abs: 7.6 10*3/uL (ref 1.7–7.7)
Platelets: 257 10*3/uL (ref 150–400)
RBC: 4.86 MIL/uL (ref 3.87–5.11)
RDW: 15.4 % (ref 11.5–15.5)
WBC: 11 10*3/uL — ABNORMAL HIGH (ref 4.0–10.5)

## 2012-05-19 LAB — COMPREHENSIVE METABOLIC PANEL
ALT: 20 U/L (ref 0–35)
BUN: 5 mg/dL — ABNORMAL LOW (ref 6–23)
CO2: 26 mEq/L (ref 19–32)
Calcium: 9.5 mg/dL (ref 8.4–10.5)
Chloride: 103 mEq/L (ref 96–112)
Creat: 0.8 mg/dL (ref 0.50–1.10)
Total Bilirubin: 0.7 mg/dL (ref 0.3–1.2)

## 2012-05-19 MED ORDER — OXYCODONE-ACETAMINOPHEN 5-325 MG/5ML PO SOLN: 5.0000 mL | ORAL | Status: DC | PRN

## 2012-05-19 NOTE — Telephone Encounter (Signed)
Patient calling into office stating she's not feeling well since leaving our office.  Patient states "Dr.Hoxowrth said to call him and he would admit me to the hospital" I explained to patient this would be a decision that Dr. Johna Sheriff would make.  Patient advised that we will call her when the lab results are available.  Patient instructed to contact our office if she decides to go the emergency room overnight.  Patient understands and will await our call with lab results.

## 2012-05-19 NOTE — Progress Notes (Signed)
Chief complaint: Abdominal pain and vomiting post-bypass  History: Patient returns for followup of her Roux Y gastric bypass. She has had a documented marginal ulcer diagnosed approximately 3 months ago. She has been hospitalized a couple of occasions, most recently approximately 3 weeks ago with persistent left upper quadrant abdominal pain and nausea and vomiting.  During that hospitalization she underwent repeat endoscopy by Dr. Ezzard Standing and there was found to be significant healing of her ulcer by about 75%. She seemed to improve and was discharged. She now however again presents with worsening left upper quadrant abdominal pain. She states she is unable to tolerate any by mouth intake, even fluids. Her left upper quadrant pain is not constant but worse with movement and frequent. She has had multiple CT scans of emergency room visits have been all within normal limits. Also since discharge from the hospital she has developed severe generalized itching with some rash on her arms and trunk. She is seeing a dermatologist and apparently it is felt to be atopic. Fungus has been ruled out. She is on multiple medications short of steroids which we have avoided because of her active ulcer.   Exam:. BP 128/76  Pulse 92  Temp(Src) 97.8 F (36.6 C)  Resp 18  Ht 5\' 7"  (1.702 m)  Wt 205 lb (92.987 kg)  BMI 32.1 kg/m2 Total weight loss 85 pounds, 8 pounds in the last month. General: appears fatigued but in no acute distress HEENT: Mucous membranes appear moist Lungs: Clear equal breath sounds bilaterally Abdomen: Localized tenderness of the left upper quadrant without guarding or peritoneal signs.  She recently had vitamin and iron levels drawn which we have ordered about a month ago and were delayed due to her hospitalization. I reviewed these and other than a minimally low iron level all these are within normal limits. Electrolytes drawn at her endocrinologist office were normal on February  12.  Assessment and plan: Persistent left upper quadrant pain and nausea and vomiting. I spent a long time with her today discussing the situation. Her symptoms are distressing and they unfortunately seem worse than we would expect based on a recent endoscopy showing significant improvement in her marginal ulcer. She feels she is not really getting by. We do not however have any clinical or laboratory evidence of severe dehydration or malnutrition and her weight loss is actually not inappropriately rapid.  As we have documented significant healing of her ulcer I certainly would not recommend surgical treatment at this point. If she feels she is unable to maintain nutrition or if we have a documented evidence of dehydration I think the next step would be a feeding tube. I would recommend laparoscopy and placement of a feeding tube in her gastric remnant. We are going to try to avoid this. I will see her back at my next office in 12 days. We're going to check electrolytes and CBC today and I will call her with the results tomorrow.

## 2012-05-20 ENCOUNTER — Telehealth (INDEPENDENT_AMBULATORY_CARE_PROVIDER_SITE_OTHER): Payer: Self-pay | Admitting: General Surgery

## 2012-05-20 NOTE — Telephone Encounter (Signed)
Call the patient and discussed her lab work drawn yesterday. She still feels like she is not keeping much down. Her lab work however as I discussed with her looks quite good. She has a minimally elevated white count of 11 but her hemoglobin is now up to 13. Her renal function is very normal with a BUN of 5 and creatinine is 0.8 which obviously did not indicate any significant dehydration. Her protein levels are increased with her albumin being 4.6 in total protein 7.9 and there are no   abnormalities at all in her chemistries. At this point I do not see an indication for hospital admission for IV hydration or nutritional support. She knows she can call anytime if she is feeling worse or better the emergency room if she feels she needs to on nights in weekends. An appointment to see her at my next available office and we will follow her closely.

## 2012-05-23 ENCOUNTER — Telehealth (INDEPENDENT_AMBULATORY_CARE_PROVIDER_SITE_OTHER): Payer: Self-pay | Admitting: General Surgery

## 2012-05-23 NOTE — Telephone Encounter (Signed)
Patient calling to see if there is an alternative medication to carafate. She states the price has gone up to $120.00 and the pharmacy could not explain why the increase. Please call patient and let her know if there is an alternative. Patient is aware Dr Johna Sheriff is our hospital MD this week and she may not get an answer today. She states she does still have some of this medication at home.

## 2012-05-26 DIAGNOSIS — Z0000 Encounter for general adult medical examination without abnormal findings: Secondary | ICD-10-CM | POA: Diagnosis not present

## 2012-05-30 ENCOUNTER — Telehealth (INDEPENDENT_AMBULATORY_CARE_PROVIDER_SITE_OTHER): Payer: Self-pay

## 2012-05-30 NOTE — Telephone Encounter (Signed)
Pt called in and received message about the Carafate.

## 2012-05-30 NOTE — Telephone Encounter (Signed)
Per message sent Dr. Johna Sheriff to [facility], thee's no alternative to her current medication Carafate.  Called patient to make aware, no answer and unable to leave a message.

## 2012-05-31 ENCOUNTER — Telehealth (INDEPENDENT_AMBULATORY_CARE_PROVIDER_SITE_OTHER): Payer: Self-pay | Admitting: General Surgery

## 2012-05-31 NOTE — Telephone Encounter (Signed)
Pt called to report her psychiatrist has increased her dosage on  meds and it has made her very itchy.  She is taking Zyrtec and Allegra, but it is not helping.  Advised pt to call her psychiatrist to report the intolerance and adjust her medicine.  She understands and will comply.

## 2012-06-01 ENCOUNTER — Ambulatory Visit (INDEPENDENT_AMBULATORY_CARE_PROVIDER_SITE_OTHER): Payer: BC Managed Care – PPO | Admitting: General Surgery

## 2012-06-01 ENCOUNTER — Encounter (INDEPENDENT_AMBULATORY_CARE_PROVIDER_SITE_OTHER): Payer: Self-pay | Admitting: General Surgery

## 2012-06-01 ENCOUNTER — Emergency Department (HOSPITAL_COMMUNITY)
Admission: EM | Admit: 2012-06-01 | Discharge: 2012-06-01 | Disposition: A | Discharge status: Home / Self Care | Payer: BC Managed Care – PPO | Attending: Emergency Medicine | Admitting: Emergency Medicine

## 2012-06-01 ENCOUNTER — Telehealth (INDEPENDENT_AMBULATORY_CARE_PROVIDER_SITE_OTHER): Payer: Self-pay

## 2012-06-01 ENCOUNTER — Encounter (HOSPITAL_COMMUNITY): Payer: Self-pay

## 2012-06-01 VITALS — BP 124/72 | HR 104 | Temp 99.3°F | Resp 16 | Ht 67.0 in | Wt 201.2 lb

## 2012-06-01 DIAGNOSIS — R011 Cardiac murmur, unspecified: Secondary | ICD-10-CM | POA: Insufficient documentation

## 2012-06-01 DIAGNOSIS — R5381 Other malaise: Secondary | ICD-10-CM | POA: Diagnosis not present

## 2012-06-01 DIAGNOSIS — F449 Dissociative and conversion disorder, unspecified: Secondary | ICD-10-CM | POA: Insufficient documentation

## 2012-06-01 DIAGNOSIS — R531 Weakness: Secondary | ICD-10-CM

## 2012-06-01 DIAGNOSIS — G43909 Migraine, unspecified, not intractable, without status migrainosus: Secondary | ICD-10-CM | POA: Insufficient documentation

## 2012-06-01 DIAGNOSIS — F909 Attention-deficit hyperactivity disorder, unspecified type: Secondary | ICD-10-CM | POA: Insufficient documentation

## 2012-06-01 DIAGNOSIS — K289 Gastrojejunal ulcer, unspecified as acute or chronic, without hemorrhage or perforation: Secondary | ICD-10-CM

## 2012-06-01 DIAGNOSIS — K219 Gastro-esophageal reflux disease without esophagitis: Secondary | ICD-10-CM | POA: Insufficient documentation

## 2012-06-01 DIAGNOSIS — F3289 Other specified depressive episodes: Secondary | ICD-10-CM | POA: Insufficient documentation

## 2012-06-01 DIAGNOSIS — Z8639 Personal history of other endocrine, nutritional and metabolic disease: Secondary | ICD-10-CM | POA: Insufficient documentation

## 2012-06-01 DIAGNOSIS — G40909 Epilepsy, unspecified, not intractable, without status epilepticus: Secondary | ICD-10-CM | POA: Insufficient documentation

## 2012-06-01 DIAGNOSIS — E1142 Type 2 diabetes mellitus with diabetic polyneuropathy: Secondary | ICD-10-CM | POA: Insufficient documentation

## 2012-06-01 DIAGNOSIS — R5383 Other fatigue: Secondary | ICD-10-CM | POA: Diagnosis not present

## 2012-06-01 DIAGNOSIS — Z8739 Personal history of other diseases of the musculoskeletal system and connective tissue: Secondary | ICD-10-CM | POA: Insufficient documentation

## 2012-06-01 DIAGNOSIS — K912 Postsurgical malabsorption, not elsewhere classified: Secondary | ICD-10-CM

## 2012-06-01 DIAGNOSIS — Z8679 Personal history of other diseases of the circulatory system: Secondary | ICD-10-CM | POA: Insufficient documentation

## 2012-06-01 DIAGNOSIS — Z862 Personal history of diseases of the blood and blood-forming organs and certain disorders involving the immune mechanism: Secondary | ICD-10-CM | POA: Insufficient documentation

## 2012-06-01 DIAGNOSIS — F411 Generalized anxiety disorder: Secondary | ICD-10-CM | POA: Insufficient documentation

## 2012-06-01 DIAGNOSIS — G4733 Obstructive sleep apnea (adult) (pediatric): Secondary | ICD-10-CM | POA: Insufficient documentation

## 2012-06-01 DIAGNOSIS — Z79899 Other long term (current) drug therapy: Secondary | ICD-10-CM | POA: Insufficient documentation

## 2012-06-01 DIAGNOSIS — E1149 Type 2 diabetes mellitus with other diabetic neurological complication: Secondary | ICD-10-CM | POA: Insufficient documentation

## 2012-06-01 DIAGNOSIS — R4789 Other speech disturbances: Secondary | ICD-10-CM | POA: Insufficient documentation

## 2012-06-01 DIAGNOSIS — H532 Diplopia: Secondary | ICD-10-CM | POA: Insufficient documentation

## 2012-06-01 MED ORDER — SODIUM CHLORIDE 0.9 % IV BOLUS (SEPSIS)
1000.0000 mL | Freq: Once | INTRAVENOUS | Status: AC
  Administered 2012-06-01: 1000 mL via INTRAVENOUS

## 2012-06-01 MED ORDER — KETOROLAC TROMETHAMINE 15 MG/ML IJ SOLN
15.0000 mg | Freq: Once | INTRAMUSCULAR | Status: AC
  Administered 2012-06-01: 15 mg via INTRAVENOUS
  Filled 2012-06-01: qty 1

## 2012-06-01 MED ORDER — LORAZEPAM 2 MG/ML IJ SOLN
1.0000 mg | Freq: Once | INTRAMUSCULAR | Status: AC
  Administered 2012-06-01: 1 mg via INTRAVENOUS
  Filled 2012-06-01: qty 1

## 2012-06-01 NOTE — Progress Notes (Signed)
History: Patient returns to the office for her scheduled office visit status post laparoscopic Roux-en-Y gastric bypass with ongoing problems of marginal ulcer with left upper quadrant abdominal pain and intolerance of food. She was seen a little over 2 weeks ago. She came in for her routine office visit today and presented to the front desk with slurred speech and some difficulty with ambulation. She was brought back to the exam room and has very slurred speech difficult to understand and states that she is unable to move her left arm or left leg. She has a history of hemiplegic migraines. These symptoms are similar but she says she has not had a headache which is unusual. She is able to tell me that she has ongoing left upper quadrant abdominal pain although has been taking somewhat less pain medication. Still states that she is unable to tolerate even fluids vomiting or diarrhea.  Past Medical History  Diagnosis Date  . Depression   . Anxiety   . Diabetes mellitus   . GERD (gastroesophageal reflux disease)   . ADD (attention deficit disorder)   . Hyperlipemia   . Herniated disc   . Allergy     Seasonal  . Diabetic neuropathy   . Shortness of breath     with exertion   . Sleep apnea     obsructive settings at 13   . Heart murmur     asa child  . Seizures     hx of / month ago - dehydration   . Migraine     hemiplegic migraine versus TIA in 2010, lst migraine 7/16 13 in ER see EPIC   Past Surgical History  Procedure Laterality Date  . Cholecystectomy    . Cesarean section    . Tonsillectomy    . Laporoscopy  2007  . Breath tek h pylori  09/11/2011    Procedure: BREATH TEK H PYLORI;  Surgeon: Mariella Saa, MD;  Location: [facility];  Service: General;  Laterality: N/A;  . 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 Bowel resection  11/30/2011   Procedure: SMALL BOWEL RESECTION;  Surgeon: Mariella Saa, MD;  Location: [facility];  Service: General;;  . Esophagogastroduodenoscopy  03/14/2012    Procedure: ESOPHAGOGASTRODUODENOSCOPY (EGD);  Surgeon: Kandis Cocking, MD;  Location: [facility];  Service: General;  Laterality: N/A;  . Esophagogastroduodenoscopy  04/25/2012    Procedure: ESOPHAGOGASTRODUODENOSCOPY (EGD);  Surgeon: Kandis Cocking, MD;  Location: [facility];  Service: General;  Laterality: N/A;   Current Outpatient Prescriptions  Medication Sig Dispense Refill  . albuterol (PROVENTIL HFA;VENTOLIN HFA) 108 (90 BASE) MCG/ACT inhaler Inhale 2 puffs into the lungs every 6 (six) hours as needed. For shortness of breath.      . ALPRAZolam (XANAX) 1 MG tablet Take 1 mg by mouth 4 (four) times daily as needed. For anxiety      . Bepotastine Besilate (BEPREVE) 1.5 % SOLN Apply 1 drop to eye 3 (three) times daily as needed. Itching/allergies      . calamine lotion Apply topically as needed.      . calcium-vitamin D (OSCAL WITH D) 500-200 MG-UNIT per tablet Take 1 tablet by mouth daily.      . cetaphil (CETAPHIL) cream Apply topically as needed.      . clobetasol (TEMOVATE) 0.05 % external solution       .  cyclobenzaprine (FLEXERIL) 10 MG tablet Take 10 mg by mouth 3 (three) times daily as needed. For headaches      . doxepin (SINEQUAN) 10 MG capsule       . famotidine (PEPCID) 20 MG tablet Take 1 tablet (20 mg total) by mouth 2 (two) times daily.  20 tablet  0  . fluticasone (CUTIVATE) 0.005 % ointment       . ketoconazole (NIZORAL) 2 % shampoo Apply 1 application topically daily as needed. For scalp itching/pain.      . Methylphenidate HCl ER (QUILLIVANT XR) 25 MG/5ML SUSR Take 12 mLs by mouth daily.      . Multiple Vitamins-Minerals (ADULT GUMMY) CHEW Chew 2 tablets by mouth daily.      Marland Kitchen oxyCODONE-acetaminophen (ROXICET) 5-325 MG/5ML solution Take 5 mLs by mouth every 4 (four) hours as needed for pain. Take 5-10 mL by mouth every  6 (six) hours as needed for pain.  250 mL  0  . pantoprazole (PROTONIX) 40 MG tablet Take 40 mg by mouth 2 times daily at 12 noon and 4 pm. Takes at 12p and 4p      . perphenazine (TRILAFON) 4 MG tablet Take 8 mg by mouth 3 (three) times daily as needed. Nausea/vomiting      . promethazine (PHENERGAN) 25 MG/ML injection Inject 25 mg into the vein 4 (four) times daily as needed.      . sucralfate (CARAFATE) 1 GM/10ML suspension Take 1 g by mouth every 6 (six) hours.      . topiramate (TOPAMAX) 25 MG capsule Take 100 mg by mouth 2 (two) times daily. Open capsules and mix in food/drink and consume all at once      . traZODone (DESYREL) 50 MG tablet Take 50-200 mg by mouth at bedtime.      . vitamin B-12 (CYANOCOBALAMIN) 1000 MCG tablet Take 2,500 mcg by mouth at bedtime.       No current facility-administered medications for this visit.   Exam: BP 124/72  Pulse 104  Temp(Src) 99.3 F (37.4 C) (Temporal)  Resp 16  Ht 5\' 7"  (1.702 m)  Wt 201 lb 3.2 oz (91.264 kg)  BMI 31.51 kg/m2 Weight is down 4 pounds over the last 2 weeks General: She is lying on exam table in no acute distress but with very slurred speech Lungs: Clear equal breath sounds bilaterally Cardiac: Regular mild tachycardia Abdomen: Nondistended. Left upper quadrant tenderness without guarding. No masses. Extremities: No edema. Neurologic: She is fully oriented and appears drowsy but easily responsive. She has no spontaneous motion of her left arm or leg. Her speech was intelligible but very slurred. She may have some slight weakness on the left side of her face.  Assessment and plan: Patient presented to the office with progressive slurred speech and weakness. She has a history of hemiplegic migraines. We are going to have her transported to the [facility] for evaluation I have spoken to the ora serrata position. In regards to her marginal ulcer her weight is not really down enough to support her history that she  has not been able to tolerate any liquids or food. Her lab has been okay to this point. She does not appear dehydrated. I will plan to follow her while she is in the hospital and at the very latest I will see her back in 3 weeks. Abdomen: Soft left upper quadrant tenderness and no guarding. No masses or organomegaly.

## 2012-06-01 NOTE — ED Provider Notes (Signed)
History    37yF with L sided weakness. Pt not sure when exactly started but first noticed slurred speech when spoke with secretary while at surgical appointment today. [place] feeling last night though that something wasn't right. Sometimes these symptoms precede her migraines.Hx of hemiplegic migraines, but reports that not really having HA.  Weakness L side of body including face. Reports diplopia. When asked if seeing images side-by-side of or on top of each other she reports both. Denies trauma. No acute neck or back pain. No urinary complaints. No confusion.  CSN: [id number]  Arrival date & time 06/01/12  1512   First MD Initiated Contact with Patient 06/01/12 1528      Chief Complaint  Patient presents with  . Migraine    (Consider location/radiation/quality/duration/timing/severity/associated sxs/prior treatment) HPI  Past Medical History  Diagnosis Date  . Depression   . Anxiety   . Diabetes mellitus   . GERD (gastroesophageal reflux disease)   . ADD (attention deficit disorder)   . Hyperlipemia   . Herniated disc   . Allergy     Seasonal  . Diabetic neuropathy   . Shortness of breath     with exertion   . Sleep apnea     obsructive settings at 13   . Heart murmur     asa child  . Seizures     hx of / month ago - dehydration   . 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 Cholecystectomy    . Cesarean section    . Tonsillectomy    . Laporoscopy  2007  . Breath tek h pylori  09/11/2011    Procedure: BREATH TEK H PYLORI;  Surgeon: Mariella Saa, MD;  Location: [facility];  Service: General;  Laterality: N/A;  . 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 Bowel resection  11/30/2011    Procedure: SMALL BOWEL RESECTION;  Surgeon:  Mariella Saa, MD;  Location: [facility];  Service: General;;  . Esophagogastroduodenoscopy  03/14/2012    Procedure: ESOPHAGOGASTRODUODENOSCOPY (EGD);  Surgeon: Kandis Cocking, MD;  Location: [facility];  Service: General;  Laterality: N/A;  . Esophagogastroduodenoscopy  04/25/2012    Procedure: ESOPHAGOGASTRODUODENOSCOPY (EGD);  Surgeon: Kandis Cocking, MD;  Location: [facility];  Service: General;  Laterality: N/A;    Family History  Problem Relation Age of Onset  . Diabetes type II    . Diabetes Mother   . Hypertension Mother   . Depression Brother   . Cancer Maternal Aunt     ovarian   . Depression Maternal Aunt   . Diabetes Maternal Aunt   . Cancer Maternal Grandmother     Lung  . COPD Maternal Grandmother   . Cancer Maternal Grandfather     Prostate  . Diabetes Maternal Grandfather   . Hearing loss Maternal Grandfather   . Hypertension Maternal Grandfather   . Stroke Maternal Grandfather   . Cancer Paternal Grandmother     Colon    History  Substance Use Topics  . Smoking status: Never Smoker   . Smokeless tobacco: Never Used  . Alcohol Use: No    OB History   Grav Para Term Preterm Abortions TAB SAB Ect Mult Living  Review of Systems  All systems reviewed and negative, other than as noted in HPI.  Allergies  Atarax; Cephalosporins; Codeine; Compazine; Decadron; Demerol; Metoclopramide hcl; Morphine and related; Penicillins; Sulfa antibiotics; Triptans; Zofran; Pork-derived products; and Shellfish-derived products  Home Medications   Current Outpatient Rx  Name  Route  Sig  Dispense  Refill  . promethazine (PHENERGAN) 25 MG/ML injection   Intravenous   Inject 25 mg into the vein 4 (four) times daily as needed.         Marland Kitchen albuterol (PROVENTIL HFA;VENTOLIN HFA) 108 (90 BASE) MCG/ACT inhaler   Inhalation   Inhale 2 puffs into the lungs every 6 (six) hours as needed. For shortness of breath.         . ALPRAZolam (XANAX) 1 MG  tablet   Oral   Take 1 mg by mouth 4 (four) times daily as needed. For anxiety         . Bepotastine Besilate (BEPREVE) 1.5 % SOLN   Ophthalmic   Apply 1 drop to eye 3 (three) times daily as needed. Itching/allergies         . calamine lotion   Topical   Apply topically as needed.         . calcium-vitamin D (OSCAL WITH D) 500-200 MG-UNIT per tablet   Oral   Take 1 tablet by mouth daily.         . cetaphil (CETAPHIL) cream   Topical   Apply topically as needed.         . clobetasol (TEMOVATE) 0.05 % external solution               . cyclobenzaprine (FLEXERIL) 10 MG tablet   Oral   Take 10 mg by mouth 3 (three) times daily as needed. For headaches         . doxepin (SINEQUAN) 10 MG capsule               . famotidine (PEPCID) 20 MG tablet   Oral   Take 1 tablet (20 mg total) by mouth 2 (two) times daily.   20 tablet   0   . fluticasone (CUTIVATE) 0.005 % ointment               . ketoconazole (NIZORAL) 2 % shampoo   Topical   Apply 1 application topically daily as needed. For scalp itching/pain.         . Methylphenidate HCl ER (QUILLIVANT XR) 25 MG/5ML SUSR   Oral   Take 12 mLs by mouth daily.         . Multiple Vitamins-Minerals (ADULT GUMMY) CHEW   Oral   Chew 2 tablets by mouth daily.         Marland Kitchen oxyCODONE-acetaminophen (ROXICET) 5-325 MG/5ML solution   Oral   Take 5 mLs by mouth every 4 (four) hours as needed for pain. Take 5-10 mL by mouth every 6 (six) hours as needed for pain.   250 mL   0   . pantoprazole (PROTONIX) 40 MG tablet   Oral   Take 40 mg by mouth 2 times daily at 12 noon and 4 pm. Takes at 12p and 4p         . perphenazine (TRILAFON) 4 MG tablet   Oral   Take 8 mg by mouth 3 (three) times daily as needed. Nausea/vomiting         . sucralfate (CARAFATE) 1 GM/10ML suspension   Oral   Take 1 g by  mouth every 6 (six) hours.         . topiramate (TOPAMAX) 25 MG capsule   Oral   Take 100 mg by mouth 2  (two) times daily. Open capsules and mix in food/drink and consume all at once         . traZODone (DESYREL) 50 MG tablet   Oral   Take 50-200 mg by mouth at bedtime.         . vitamin B-12 (CYANOCOBALAMIN) 1000 MCG tablet   Oral   Take 2,500 mcg by mouth at bedtime.           BP 111/74  Pulse 91  Temp(Src) 98.7 F (37.1 C) (Oral)  Resp 17  SpO2 99%  Physical Exam  Nursing note and vitals reviewed. Constitutional: She appears well-developed and well-nourished. No distress.  HENT:  Head: Normocephalic and atraumatic.  Eyes: Conjunctivae are normal. Right eye exhibits no discharge. Left eye exhibits no discharge.  Neck: Neck supple.  No nuchal rigidity  Cardiovascular: Normal rate, regular rhythm and normal heart sounds.  Exam reveals no gallop and no friction rub.   No murmur heard. Pulmonary/Chest: Effort normal and breath sounds normal. No respiratory distress.  Abdominal: Soft. She exhibits no distension. There is no tenderness.  Musculoskeletal: She exhibits no edema and no tenderness.  Neurological: She is alert. She exhibits normal muscle tone. Coordination normal.  Mild L ptosis. Speech slowed and slurred but understandable. When pt asked to raise eyebrows/wrinkle forehead she won't/can't on either side. Nasolabial folds symmetric, but when asked to smile only raised R side of mouth. Pt with 0/5 strength L upper/lower extremity on command but protects face when L hand raised and dropped. When performing other parts of exam and history taking pt noted to rearrange self in bed with assistance of LUE. Reports no sensation when touched on L face, L upper and lower extremity but winced and withdrew slightly with deep nail pressure to toes L foot.   Skin: Skin is warm and dry.  Psychiatric: She has a normal mood and affect. Her behavior is normal. Thought content normal.    ED Course  Procedures (including critical care time)  Labs Reviewed - No data to display No  results found.   1. Conversion disorder   2. Left-sided weakness       MDM  37yF with L sided weakness. 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 I do not feel that she is having a stroke. Hx of similar symptoms. Many inconsistencies on exam. Per review of old notes, prior suspicion of conversion disorder.  I suspect the same. I do not feel neuroimaging or laboratory testing of much utility. I have a very low suspicion for emergent etiology and I feel the patient is safe for discharge. She has a neurologist in [facility] she can follow-up with.    5:32 PM Pt upset about discharge. Feels like she "isn't being taken seriously." I discussed with pt at length that I feel she is safe to go home and emergent return precautions discussed. Per nursing, after I left the room the patient sat up in bed and pulled her cardiac leads off herself including using her LUE.         Raeford Razor, MD 06/01/12 [phone]

## 2012-06-01 NOTE — Telephone Encounter (Signed)
EMS called for patient transport to [facility].

## 2012-06-01 NOTE — ED Notes (Signed)
Bed:WA09<BR> Expected date:<BR> Expected time:<BR> Means of arrival:<BR> Comments:<BR> ems

## 2012-06-01 NOTE — ED Notes (Signed)
Per EMS- Patient was at a GI appointment and began having a hemiplegic headache which she has a history of. Patient had numbness, left sided weakness, and slurred speech.

## 2012-06-06 ENCOUNTER — Encounter: Payer: BC Managed Care – PPO | Attending: General Surgery | Admitting: *Deleted

## 2012-06-06 ENCOUNTER — Encounter: Payer: Self-pay | Admitting: *Deleted

## 2012-06-06 DIAGNOSIS — Z01818 Encounter for other preprocedural examination: Secondary | ICD-10-CM | POA: Insufficient documentation

## 2012-06-06 DIAGNOSIS — Z713 Dietary counseling and surveillance: Secondary | ICD-10-CM | POA: Insufficient documentation

## 2012-06-06 NOTE — Progress Notes (Addendum)
  Follow-up visit:  6 Months Post-Operative RYGB Surgery  Medical Nutrition Therapy:  Appt start time: 1045  End time:  1115.  Primary concerns today:  Post-operative Bariatric Surgery Nutrition Management. Consuming foods like baked salmon fishcakes, bagel (1/2 or whole med sized), etc.  Getting protein in. Feels she isn't doing well with her eating, however, and states she is all of a sudden craving sweets and eating them 1-2 times/day (ex: 2 cookies, 2-3 peppermints). Reports her ulcers are still causing her chronic pain in ULQ (with or without food).    Surgery date: 11/30/11 Surgery type: RYGB Start weight @ [facility]: 290.5 lbs (09/04/11)  Weight today: 205.5 Weight change: 2.0 lbs Total weight lost: 85.0 lbs BMI: 32.2 kg/m^2  Weight loss goal: 175 lbs % goal met: 73%  TANITA BODY COMP RESULTS   09/04/11  12/15/11  12/29/11 01/26/12 02/25/12 04/07/12 05/04/12 06/06/12  Fat Mass (lbs)  157.0  135.0  133.5 124.0 112.0 98.5 91.5 91.0  Fat Free Mass (lbs)  133.5  134.5  122.5 125.5 123.5 117.5 116.0 114.5  Total Body Water (lbs)  97.5  98.5  89.5 92.0 90.5 86.0 85.0 84.0   Fluid intake:  64+ oz Estimated total protein intake:  Salmon (2 oz), 2-3 oz protein at other meals, 1-2 eggs at breakfast some days = 60g    Medications: See medication list provided by patient - scanned and under media tab Supplementation: Taking as directed  CBG monitoring: No longer checking Average CBG per patient: ~ 80 mg in recent hospital visit Last HgA1c (in hospital 03/13/12 per pt):  6.1%  Lab Results  Component Value Date   HGBA1C 6.3* 03/04/2012   Using straws: No Drinking while eating: Usually not Hair loss: Mild to Moderate Carbonated beverages: No N/V/D/C:  N/V related to migraines/ulcers Dumping syndrome: None  Recent physical activity:  On hiatus from BELT program d/t migraines and ulcers   Progress Towards Goal(s):  In progress.   Nutritional Diagnosis:  [place]-3.3 Overweight/obesity As related  to recent RYGB surgery.  As evidenced by patient attempting to follow post-op nutritional guidelines for continued weight loss.   Intervention:  Nutrition education/diet advancement.  Monitoring/Evaluation:  Dietary intake, exercise, and body weight. Follow up in 3 months for 8 month post-op visit.

## 2012-06-06 NOTE — Patient Instructions (Addendum)
Goals:  Follow Phase 3B: High Protein + Non-Starchy Vegetables  Increase lean protein foods to meet 60-80g goal  Continue fluid intake of 64oz +  Add 15 grams of carbohydrate (fruit, whole grain, starchy vegetable) with meals  Aim for >30 min of physical activity daily as able 

## 2012-06-13 ENCOUNTER — Telehealth (INDEPENDENT_AMBULATORY_CARE_PROVIDER_SITE_OTHER): Payer: Self-pay

## 2012-06-13 NOTE — Telephone Encounter (Signed)
This sounds like a chronic problem of pt. Should be able to wait til tomorrow when [facility] available.

## 2012-06-13 NOTE — Telephone Encounter (Signed)
Please ask Dr Johna Sheriff tomorrow.

## 2012-06-13 NOTE — Telephone Encounter (Signed)
Patient states she is in a lot of pain in her abdomin from her ulcers. I asked if she was having any other symptoms and she states just abdominal pain. She is requesting pain medicine but specifically said she did not want roxicet. I let her know [facility] is unavailable today so she requested I ask Dr Andrey Campanile since he has seen her in the past. I told her I would forward him and nurse the message and we would call her back.

## 2012-06-14 ENCOUNTER — Telehealth (INDEPENDENT_AMBULATORY_CARE_PROVIDER_SITE_OTHER): Payer: Self-pay | Admitting: *Deleted

## 2012-06-14 NOTE — Telephone Encounter (Signed)
Spoke to [employer] MD who ordered Norco 5/325mg  1 tab every 6 hours as needed for pain #30 no refills.  Explained to patient that she needs to try to be weaning off the narcotic medication.  Patient states understanding.  [facility] MD also approved for her to receive one Cortisone injection at this time.  Patient aware of the above and agreeable at this time.  Prescription called into [facility] [phone]

## 2012-06-14 NOTE — Telephone Encounter (Signed)
Patient called back this morning to report she is still having LUQ abdominal pain with no relief.  Patient again requesting pain medication however not Roxicet.  Patient also wants to find out if she can receive a one time Cortisone shot from her allergist.

## 2012-06-15 DIAGNOSIS — Z01419 Encounter for gynecological examination (general) (routine) without abnormal findings: Secondary | ICD-10-CM | POA: Diagnosis not present

## 2012-06-15 DIAGNOSIS — Z30431 Encounter for routine checking of intrauterine contraceptive device: Secondary | ICD-10-CM | POA: Diagnosis not present

## 2012-06-15 DIAGNOSIS — IMO0002 Reserved for concepts with insufficient information to code with codable children: Secondary | ICD-10-CM | POA: Diagnosis not present

## 2012-06-20 ENCOUNTER — Encounter (INDEPENDENT_AMBULATORY_CARE_PROVIDER_SITE_OTHER): Payer: Self-pay

## 2012-07-12 ENCOUNTER — Encounter: Payer: Self-pay | Admitting: *Deleted

## 2012-07-21 ENCOUNTER — Encounter (INDEPENDENT_AMBULATORY_CARE_PROVIDER_SITE_OTHER): Payer: Self-pay

## 2012-08-02 ENCOUNTER — Encounter (HOSPITAL_COMMUNITY): Payer: Self-pay | Admitting: *Deleted

## 2012-08-02 ENCOUNTER — Emergency Department (HOSPITAL_COMMUNITY)
Admission: EM | Admit: 2012-08-02 | Discharge: 2012-08-02 | Disposition: A | Discharge status: Home / Self Care | Payer: BC Managed Care – PPO | Attending: Emergency Medicine | Admitting: Emergency Medicine

## 2012-08-02 ENCOUNTER — Emergency Department (HOSPITAL_COMMUNITY): Payer: BC Managed Care – PPO

## 2012-08-02 DIAGNOSIS — G43409 Hemiplegic migraine, not intractable, without status migrainosus: Secondary | ICD-10-CM | POA: Diagnosis not present

## 2012-08-02 DIAGNOSIS — F988 Other specified behavioral and emotional disorders with onset usually occurring in childhood and adolescence: Secondary | ICD-10-CM | POA: Insufficient documentation

## 2012-08-02 DIAGNOSIS — E1149 Type 2 diabetes mellitus with other diabetic neurological complication: Secondary | ICD-10-CM | POA: Insufficient documentation

## 2012-08-02 DIAGNOSIS — R011 Cardiac murmur, unspecified: Secondary | ICD-10-CM | POA: Insufficient documentation

## 2012-08-02 DIAGNOSIS — Z8669 Personal history of other diseases of the nervous system and sense organs: Secondary | ICD-10-CM | POA: Insufficient documentation

## 2012-08-02 DIAGNOSIS — M6281 Muscle weakness (generalized): Secondary | ICD-10-CM | POA: Diagnosis not present

## 2012-08-02 DIAGNOSIS — IMO0002 Reserved for concepts with insufficient information to code with codable children: Secondary | ICD-10-CM | POA: Insufficient documentation

## 2012-08-02 DIAGNOSIS — R5381 Other malaise: Secondary | ICD-10-CM | POA: Insufficient documentation

## 2012-08-02 DIAGNOSIS — E1142 Type 2 diabetes mellitus with diabetic polyneuropathy: Secondary | ICD-10-CM | POA: Insufficient documentation

## 2012-08-02 DIAGNOSIS — K219 Gastro-esophageal reflux disease without esophagitis: Secondary | ICD-10-CM | POA: Insufficient documentation

## 2012-08-02 DIAGNOSIS — Z79899 Other long term (current) drug therapy: Secondary | ICD-10-CM | POA: Insufficient documentation

## 2012-08-02 DIAGNOSIS — R531 Weakness: Secondary | ICD-10-CM

## 2012-08-02 DIAGNOSIS — Z8739 Personal history of other diseases of the musculoskeletal system and connective tissue: Secondary | ICD-10-CM | POA: Insufficient documentation

## 2012-08-02 DIAGNOSIS — E785 Hyperlipidemia, unspecified: Secondary | ICD-10-CM | POA: Insufficient documentation

## 2012-08-02 DIAGNOSIS — R5383 Other fatigue: Secondary | ICD-10-CM | POA: Insufficient documentation

## 2012-08-02 DIAGNOSIS — F3289 Other specified depressive episodes: Secondary | ICD-10-CM | POA: Insufficient documentation

## 2012-08-02 DIAGNOSIS — F329 Major depressive disorder, single episode, unspecified: Secondary | ICD-10-CM | POA: Insufficient documentation

## 2012-08-02 DIAGNOSIS — F411 Generalized anxiety disorder: Secondary | ICD-10-CM | POA: Insufficient documentation

## 2012-08-02 LAB — POCT I-STAT, CHEM 8
BUN: 14 mg/dL (ref 6–23)
Chloride: 102 mEq/L (ref 96–112)
Glucose, Bld: 98 mg/dL (ref 70–99)
HCT: 38 % (ref 36.0–46.0)
Potassium: 4.1 mEq/L (ref 3.5–5.1)

## 2012-08-02 LAB — DIFFERENTIAL
Basophils Absolute: 0 10*3/uL (ref 0.0–0.1)
Basophils Relative: 0 % (ref 0–1)
Eosinophils Absolute: 0.1 10*3/uL (ref 0.0–0.7)
Eosinophils Relative: 2 % (ref 0–5)
Monocytes Absolute: 0.5 10*3/uL (ref 0.1–1.0)

## 2012-08-02 LAB — COMPREHENSIVE METABOLIC PANEL
AST: 14 U/L (ref 0–37)
Albumin: 3.4 g/dL — ABNORMAL LOW (ref 3.5–5.2)
CO2: 32 mEq/L (ref 19–32)
Calcium: 8.9 mg/dL (ref 8.4–10.5)
Creatinine, Ser: 0.61 mg/dL (ref 0.50–1.10)
GFR calc non Af Amer: >90 mL/min (ref >90)
Total Protein: 6.9 g/dL (ref 6.0–8.3)

## 2012-08-02 LAB — CBC
HCT: 37.9 % (ref 36.0–46.0)
MCH: 26.3 pg (ref 26.0–34.0)
MCHC: 31.7 g/dL (ref 30.0–36.0)
MCV: 82.9 fL (ref 78.0–100.0)
Platelets: 234 10*3/uL (ref 150–400)
RDW: 13.9 % (ref 11.5–15.5)

## 2012-08-02 LAB — PROTIME-INR: Prothrombin Time: 14.6 seconds (ref 11.6–15.2)

## 2012-08-02 LAB — ETHANOL: Alcohol, Ethyl (B): <11 mg/dL (ref 0–11)

## 2012-08-02 MED ORDER — BUTALBITAL-APAP-CAFFEINE 50-325-40 MG PO TABS
1.0000 | ORAL_TABLET | Freq: Once | ORAL | Status: AC
  Administered 2012-08-02: 1 via ORAL
  Filled 2012-08-02: qty 1

## 2012-08-02 MED ORDER — BUTALBITAL-APAP-CAFFEINE 50-325-40 MG PO TABS: 1.0000 | ORAL_TABLET | Freq: Four times a day (QID) | ORAL | Status: DC | PRN

## 2012-08-02 MED ORDER — DIPHENHYDRAMINE HCL 50 MG/ML IJ SOLN: 25.0000 mg | Freq: Once | INTRAMUSCULAR | Status: DC

## 2012-08-02 MED ORDER — DIPHENHYDRAMINE HCL 25 MG PO CAPS
25.0000 mg | ORAL_CAPSULE | Freq: Once | ORAL | Status: AC
  Administered 2012-08-02: 25 mg via ORAL
  Filled 2012-08-02: qty 1

## 2012-08-02 NOTE — ED Notes (Signed)
Patient transported to MRI 

## 2012-08-02 NOTE — Consult Note (Signed)
Referring Physician: Silverio Lay    Chief Complaint: left sided numbness and weakness  HPI:                                                                                                                                         [name] is an 37 y.o. female who was brought to the ED as a code stroke. She was last known well when she went to sleep at 10 PM last night.  Per EMS her husband noted noted wife was on the floor and assisted her back into bed and brought kids to school. When he returned home father-in-law noted she was not speaking well and left side was not moving well.  Patient was brought to ED by EMS. On arrival she was noted to have no HA, stating she has blurred vision and cannot see well in left visual field, not moving her left arm or leg, showing mild dysarthria and able to follow commands.   Note: patient has been seen in hospital for prior hemiplegic migraine HA with similar presentation. In previous note 12/2011 is was noted "Patient had seen Dr. Catalina Lunger in [facility] as out patient for HA . During consultation by phone he had described narcotic seeking behavior."   Date last known well: 5.12.14 Time last known well: 10 pm tPA Given: No: out of therapeutic window  Past Medical History  Diagnosis Date  . Depression   . Anxiety   . Diabetes mellitus   . GERD (gastroesophageal reflux disease)   . ADD (attention deficit disorder)   . Hyperlipemia   . Herniated disc   . Allergy     Seasonal  . Diabetic neuropathy   . Shortness of breath     with exertion   . Sleep apnea     obsructive settings at 13   . Heart murmur     asa child  . Seizures     hx of / month ago - dehydration   . 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 Cholecystectomy    . Cesarean section    . Tonsillectomy    . Laporoscopy  2007  . Breath tek h pylori  09/11/2011    Procedure: BREATH TEK H  PYLORI;  Surgeon: Mariella Saa, MD;  Location: [facility];  Service: General;  Laterality: N/A;  . 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 Bowel resection  11/30/2011    Procedure: SMALL BOWEL RESECTION;  Surgeon: Mariella Saa, MD;  Location: [facility];  Service: General;;  . Esophagogastroduodenoscopy  03/14/2012    Procedure: ESOPHAGOGASTRODUODENOSCOPY (EGD);  Surgeon: Kandis Cocking, MD;  Location: [facility];  Service: General;  Laterality: N/A;  . Esophagogastroduodenoscopy  04/25/2012    Procedure: ESOPHAGOGASTRODUODENOSCOPY (EGD);  Surgeon: Kandis Cocking, MD;  Location: [facility];  Service: General;  Laterality: N/A;    Family History  Problem Relation Age of Onset  . Diabetes type II    . Diabetes Mother   . Hypertension Mother   . Depression Brother   . Cancer Maternal Aunt     ovarian   . Depression Maternal Aunt   . Diabetes Maternal Aunt   . Cancer Maternal Grandmother     Lung  . COPD Maternal Grandmother   . Cancer Maternal Grandfather     Prostate  . Diabetes Maternal Grandfather   . Hearing loss Maternal Grandfather   . Hypertension Maternal Grandfather   . Stroke Maternal Grandfather   . Cancer Paternal Grandmother     Colon   Social History:  reports that she has never smoked. She has never used smokeless tobacco. She reports that she does not drink alcohol or use illicit drugs.  Allergies:  Allergies  Allergen Reactions  . Atarax (Hydroxyzine Hcl) Anaphylaxis  . Cephalosporins Anaphylaxis  . Codeine Anaphylaxis    Tolerates Dilaudid and Percocet.  . Compazine Anaphylaxis    Tolerates Phenergan.  . Decadron (Dexamethasone) Anaphylaxis  . Demerol Anaphylaxis  . Metoclopramide Hcl Anaphylaxis  . Morphine And Related Anaphylaxis    Tolerates Dilaudid and Percocet.  Marland Kitchen Penicillins Anaphylaxis  . Sulfa Antibiotics  Anaphylaxis  . Triptans Anaphylaxis  . Zofran Anaphylaxis  . Pork-Derived Products Other (See Comments)    Religous reasons--per pt ok to take Heparin when medically necessary  . Shellfish-Derived Products Other (See Comments)    Religous reasons    Medications:                                                                                                                           No current facility-administered medications for this encounter.   Current Outpatient Prescriptions  Medication Sig Dispense Refill  . ALPRAZolam (XANAX) 1 MG tablet Take 1-2 mg by mouth 4 (four) times daily as needed for anxiety. For anxiety      . calamine lotion Apply topically as needed.      . calcium-vitamin D (OSCAL WITH D) 500-200 MG-UNIT per tablet Take 1 tablet by mouth daily.      . cetaphil (CETAPHIL) cream Apply 1 application topically as needed (itching).       . clobetasol (TEMOVATE) 0.05 % external solution       . cyclobenzaprine (FLEXERIL) 10 MG tablet Take 10 mg by mouth 3 (three) times daily as needed. For headaches      . doxepin (SINEQUAN) 50 MG capsule Take 50 mg by mouth 3 (three) times daily.      . famotidine (PEPCID) 20 MG tablet Take 1 tablet (20 mg total) by mouth 2 (two) times daily.  20 tablet  0  . fluticasone (CUTIVATE)  0.005 % ointment Apply 1 application topically.       Marland Kitchen ketoconazole (NIZORAL) 2 % shampoo Apply 1 application topically daily as needed. For scalp itching/pain.      . Methylphenidate HCl ER (QUILLIVANT XR) 25 MG/5ML SUSR Take 12 mLs by mouth daily.      . Multiple Vitamins-Minerals (ADULT GUMMY) CHEW Chew 2 tablets by mouth daily.      Marland Kitchen OLANZapine (ZYPREXA) 2.5 MG tablet Take 2.5 mg by mouth at bedtime. Take up to TID for hemiplegic migraines      . pantoprazole (PROTONIX) 40 MG tablet Take 40 mg by mouth 2 times daily at 12 noon and 4 pm. Takes at 12p and 4p      . perphenazine (TRILAFON) 4 MG tablet Take 8 mg by mouth 3 (three) times daily as needed.  Nausea/vomiting      . promethazine (PHENERGAN) 25 MG/ML injection Inject 25 mg into the vein 4 (four) times daily as needed.      . ranitidine (ZANTAC) 150 MG tablet Take 150 mg by mouth 2 (two) times daily.      . sucralfate (CARAFATE) 1 GM/10ML suspension Take 1 g by mouth every 6 (six) hours.      . topiramate (TOPAMAX) 25 MG capsule Take 100 mg by mouth 2 (two) times daily. Open capsules and mix in food/drink and consume all at once      . traZODone (DESYREL) 50 MG tablet Take 50-200 mg by mouth at bedtime.      Marland Kitchen UNABLE TO FIND Take 500 mcg by mouth daily. Med Name: Biotin 500 mcg      . vitamin B-12 (CYANOCOBALAMIN) 1000 MCG tablet Take 2,500 mcg by mouth at bedtime.         ROS:                                                                                                                                       History obtained from the patient  General ROS: negative for - chills, fatigue, fever, night sweats, weight gain or weight loss Psychological ROS: positive for - behavioral disorder,  Ophthalmic ROS: positive for - blurry vision,  loss of vision ENT ROS: negative for - epistaxis, nasal discharge, oral lesions, sore throat, tinnitus or vertigo Allergy and Immunology ROS: negative for - hives or itchy/watery eyes Hematological and Lymphatic ROS: negative for - bleeding problems, bruising or swollen lymph nodes Endocrine ROS: negative for - galactorrhea, hair pattern changes, polydipsia/polyuria or temperature intolerance Respiratory ROS: negative for - cough, hemoptysis, shortness of breath or wheezing Cardiovascular ROS: negative for - chest pain, dyspnea on exertion, edema or irregular heartbeat Gastrointestinal ROS: negative for - abdominal pain, diarrhea, hematemesis, nausea/vomiting or stool incontinence Genito-Urinary ROS: negative for - dysuria, hematuria, incontinence or urinary frequency/urgency Musculoskeletal ROS: positive for -  muscular weakness Neurological ROS: as  noted in HPI Dermatological ROS: negative  for rash and skin lesion changes  Neurologic Examination:                                                                                                      Blood pressure 103/63, pulse 85, resp. rate 17, weight 98.793 kg (217 lb 12.8 oz), SpO2 88.00%.  Mental Status: Alert, oriented to place but thought it was April.  Speech showing mild dysarthria without evidence of aphasia.  Able to follow 3 step commands without difficulty. Cranial Nerves: II: Discs flat bilaterally; Visual fields shows unable to count fingers on the left field but will look to the left when not prompted, pupils equal, round, reactive to light and accommodation III,IV, VI: ptosis not present, extra-ocular motions intact bilaterally V,VII: smile asymmetric on left, facial light touch sensation decreased on the left and splits midline to tuning fork VIII: hearing normal bilaterally IX,X: gag reflex present XI: bilateral shoulder shrug XII: midline tongue extension Motor: Right : Upper extremity   5/5    Left:     Upper extremity   Flaccid with no effort  Lower extremity   5/5     Lower extremity   Flaccid with no effort --positive hoover's and diverted left arm away from face when held over head Tone and bulk:normal tone throughout; no atrophy noted Sensory: Pinprick and light touch intact on the right arm and leg but none on the left,  Deep Tendon Reflexes: 2+ and symmetric throughout Plantars: Right: downgoing   Left: downgoing Cerebellar: normal finger-to-nose on the right,  normal heel-to-shin test on the right CV: pulses palpable throughout    Results for orders placed during the hospital encounter of 08/02/12 (from the past 48 hour(s))  CBC     Status: None   Collection Time    08/02/12  8:24 AM      Result Value Range   WBC 8.1  4.0 - 10.5 K/uL   RBC 4.57  3.87 - 5.11 MIL/uL   Hemoglobin 12.0  12.0 - 15.0 g/dL   HCT 16.1  09.6 - 04.5 %   MCV 82.9  78.0 -  100.0 fL   MCH 26.3  26.0 - 34.0 pg   MCHC 31.7  30.0 - 36.0 g/dL   RDW 40.9  81.1 - 91.4 %   Platelets 234  150 - 400 K/uL  DIFFERENTIAL     Status: None   Collection Time    08/02/12  8:24 AM      Result Value Range   Neutrophils Relative 66  43 - 77 %   Neutro Abs 5.4  1.7 - 7.7 K/uL   Lymphocytes Relative 27  12 - 46 %   Lymphs Abs 2.2  0.7 - 4.0 K/uL   Monocytes Relative 6  3 - 12 %   Monocytes Absolute 0.5  0.1 - 1.0 K/uL   Eosinophils Relative 2  0 - 5 %   Eosinophils Absolute 0.1  0.0 - 0.7 K/uL   Basophils Relative 0  0 - 1 %   Basophils Absolute 0.0  0.0 - 0.1  K/uL  POCT I-STAT TROPONIN I     Status: None   Collection Time    08/02/12  8:32 AM      Result Value Range   Troponin i, poc 0.00  0.00 - 0.08 ng/mL   Comment 3            Comment: Due to the release kinetics of cTnI,     a negative result within the first hours     of the onset of symptoms does not rule out     myocardial infarction with certainty.     If myocardial infarction is still suspected,     repeat the test at appropriate intervals.  POCT I-STAT, CHEM 8     Status: None   Collection Time    08/02/12  8:34 AM      Result Value Range   Sodium 142  135 - 145 mEq/L   Potassium 4.1  3.5 - 5.1 mEq/L   Chloride 102  96 - 112 mEq/L   BUN 14  6 - 23 mg/dL   Creatinine, Ser 1.61  0.50 - 1.10 mg/dL   Glucose, Bld 98  70 - 99 mg/dL   Calcium, Ion 0.96  0.45 - 1.23 mmol/L   TCO2 32  0 - 100 mmol/L   Hemoglobin 12.9  12.0 - 15.0 g/dL   HCT 40.9  81.1 - 91.4 %   Ct Head Wo Contrast  08/02/2012  *RADIOLOGY REPORT*  Clinical Data: Left-sided weakness.  Slurred speech.  CT HEAD WITHOUT CONTRAST  Technique:  Contiguous axial images were obtained from the base of the skull through the vertex without contrast.  Comparison: 02/16/2012  Findings: Bone windows demonstrate clear paranasal sinuses and mastoid air cells.  Soft tissue windows demonstrate no  mass lesion, hemorrhage, hydrocephalus, acute infarct,  intra-axial, or extra-axial fluid collection.  IMPRESSION: Normal head CT.  These results were called by telephone on 08/02/2012 at 8:43 a.m. to Dr. Thad Ranger, who verbally acknowledged these results.   Original Report Authenticated By: Jeronimo Greaves, M.D.    Assessment and plan discussed with with attending physician and they are in agreement.    Felicie Morn PA-C [facility] [phone]  08/02/2012, 8:48 AM   Patient seen and examined.  Clinical course and management discussed.  Necessary edits performed.  I agree with the above.  Assessment and plan of care developed and discussed below.    Assessment: 37 y.o. 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  Today awakened with left sided weakness.  Headache patient feels is related to her fall this morning and not her usual migraine.  Head CT reviewed and shows no acute abnormalities.  Patient continues to have left sided weakness.  Doubt acute infarct but would do further testing since headache not typical and patient's with migraine are at increased incidence of stroke.      Stroke Risk Factors - diabetes mellitus and hyperlipidemia  Plan: 1. MRI of the brain without contrast.  Would not initiate stroke work up unless MRI is indicative of an acute infarct.   2. Telemetry monitoring 3. Frequent neuro checks  Case discussed with Dr. Geanie Berlin, MD [facility] [phone]  08/02/2012  10:08 AM   MRI of the brain performed and reviewed.  No evidence of an acute infarct noted.  Patient has someone to care for her at home and wishes to return home.  With no evidence of an acute infarct on imaging, this  plan is most reasonable.  No further neurologic work up recommended at this time.     08/02/2012, 11:15 AM

## 2012-08-02 NOTE — ED Provider Notes (Signed)
History     CSN: [id number]  Arrival date & time 08/02/12  0818   First MD Initiated Contact with Patient 08/02/12 [id number]      No chief complaint on file.   (Consider location/radiation/quality/duration/timing/severity/associated sxs/prior treatment) HPI Comments: 37 y.o. female who presents to the Er w/ the cc of left sided weakness. Pt states she has had this before and her symptoms are similar. Has not had a stroke before. Pt states it has not been diagnosed as a stroke prior. She states difficulty speaking as well and walking. Code stroke was called en route -- neurology team has evaluated pt and they suspect secondary to conversion d/o, but have ordered CT / MRI.   Patient is a 37 y.o. female presenting with general illness. The history is provided by the patient.  Illness  The current episode started today. The onset was gradual. The problem occurs occasionally. The problem has been unchanged. The problem is mild. Nothing relieves the symptoms. Nothing aggravates the symptoms. Pertinent negatives include no fever, no abdominal pain, no nausea and no vomiting.    Past Medical History  Diagnosis Date  . Depression   . Anxiety   . Diabetes mellitus   . GERD (gastroesophageal reflux disease)   . ADD (attention deficit disorder)   . Hyperlipemia   . Herniated disc   . Allergy     Seasonal  . Diabetic neuropathy   . Shortness of breath     with exertion   . Sleep apnea     obsructive settings at 13   . Heart murmur     asa child  . Seizures     hx of / month ago - dehydration   . 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 Cholecystectomy    . Cesarean section    . Tonsillectomy    . Laporoscopy  2007  . Breath tek h pylori  09/11/2011    Procedure: BREATH TEK H PYLORI;  Surgeon: Mariella Saa, MD;  Location: [facility];  Service: General;  Laterality: N/A;  .  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 Bowel resection  11/30/2011    Procedure: SMALL BOWEL RESECTION;  Surgeon: Mariella Saa, MD;  Location: [facility];  Service: General;;  . Esophagogastroduodenoscopy  03/14/2012    Procedure: ESOPHAGOGASTRODUODENOSCOPY (EGD);  Surgeon: Kandis Cocking, MD;  Location: [facility];  Service: General;  Laterality: N/A;  . Esophagogastroduodenoscopy  04/25/2012    Procedure: ESOPHAGOGASTRODUODENOSCOPY (EGD);  Surgeon: Kandis Cocking, MD;  Location: [facility];  Service: General;  Laterality: N/A;    Family History  Problem Relation Age of Onset  . Diabetes type II    . Diabetes Mother   . Hypertension Mother   . Depression Brother   . Cancer Maternal Aunt     ovarian   . Depression Maternal Aunt   . Diabetes Maternal Aunt   . Cancer Maternal Grandmother     Lung  . COPD Maternal Grandmother   . Cancer Maternal Grandfather     Prostate  . Diabetes Maternal Grandfather   . Hearing loss Maternal Grandfather   . Hypertension Maternal Grandfather   . Stroke Maternal Grandfather   . Cancer Paternal Grandmother  Colon    History  Substance Use Topics  . Smoking status: Never Smoker   . Smokeless tobacco: Never Used  . Alcohol Use: No    OB History   Grav Para Term Preterm Abortions TAB SAB Ect Mult Living                  Review of Systems  Unable to perform ROS: Acuity of condition  Constitutional: Negative for fever.  Gastrointestinal: Negative for nausea, vomiting and abdominal pain.    Allergies  Atarax; Cephalosporins; Codeine; Compazine; Decadron; Demerol; Metoclopramide hcl; Morphine and related; Penicillins; Sulfa antibiotics; Triptans; Zofran; Pork-derived products; and Shellfish-derived products  Home Medications   Current Outpatient Rx  Name  Route  Sig  Dispense  Refill  . ALPRAZolam  (XANAX) 1 MG tablet   Oral   Take 1-2 mg by mouth 4 (four) times daily as needed for anxiety. For anxiety         . calamine lotion   Topical   Apply topically as needed.         . calcium-vitamin D (OSCAL WITH D) 500-200 MG-UNIT per tablet   Oral   Take 1 tablet by mouth daily.         . cetaphil (CETAPHIL) cream   Topical   Apply 1 application topically as needed (itching).          . clobetasol (TEMOVATE) 0.05 % external solution               . cyclobenzaprine (FLEXERIL) 10 MG tablet   Oral   Take 10 mg by mouth 3 (three) times daily as needed. For headaches         . doxepin (SINEQUAN) 50 MG capsule   Oral   Take 50 mg by mouth 3 (three) times daily.         . famotidine (PEPCID) 20 MG tablet   Oral   Take 1 tablet (20 mg total) by mouth 2 (two) times daily.   20 tablet   0   . fluticasone (CUTIVATE) 0.005 % ointment   Topical   Apply 1 application topically.          Marland Kitchen ketoconazole (NIZORAL) 2 % shampoo   Topical   Apply 1 application topically daily as needed. For scalp itching/pain.         . Methylphenidate HCl ER (QUILLIVANT XR) 25 MG/5ML SUSR   Oral   Take 12 mLs by mouth daily.         . Multiple Vitamins-Minerals (ADULT GUMMY) CHEW   Oral   Chew 2 tablets by mouth daily.         Marland Kitchen OLANZapine (ZYPREXA) 2.5 MG tablet   Oral   Take 2.5 mg by mouth at bedtime. Take up to TID for hemiplegic migraines         . pantoprazole (PROTONIX) 40 MG tablet   Oral   Take 40 mg by mouth 2 times daily at 12 noon and 4 pm. Takes at 12p and 4p         . perphenazine (TRILAFON) 4 MG tablet   Oral   Take 8 mg by mouth 3 (three) times daily as needed. Nausea/vomiting         . promethazine (PHENERGAN) 25 MG/ML injection   Intravenous   Inject 25 mg into the vein 4 (four) times daily as needed.         . ranitidine (ZANTAC) 150 MG tablet  Oral   Take 150 mg by mouth 2 (two) times daily.         . sucralfate (CARAFATE) 1  GM/10ML suspension   Oral   Take 1 g by mouth every 6 (six) hours.         . topiramate (TOPAMAX) 25 MG capsule   Oral   Take 100 mg by mouth 2 (two) times daily. Open capsules and mix in food/drink and consume all at once         . traZODone (DESYREL) 50 MG tablet   Oral   Take 50-200 mg by mouth at bedtime.         Marland Kitchen UNABLE TO FIND   Oral   Take 500 mcg by mouth daily. Med Name: Biotin 500 mcg         . vitamin B-12 (CYANOCOBALAMIN) 1000 MCG tablet   Oral   Take 2,500 mcg by mouth at bedtime.           There were no vitals taken for this visit.  Physical Exam  Constitutional: She is oriented to person, place, and time. She appears well-developed. No distress.  HENT:  Head: Normocephalic and atraumatic.  Eyes: Pupils are equal, round, and reactive to light. Right eye exhibits no discharge. Left eye exhibits no discharge.  Neck: Neck supple. No tracheal deviation present.  Cardiovascular: Normal rate.  Exam reveals no gallop and no friction rub.   Pulmonary/Chest: No stridor. No respiratory distress. She has no wheezes.  Abdominal: Soft. She exhibits no distension. There is no tenderness. There is no rebound.  Musculoskeletal: She exhibits no edema and no tenderness.  Neurological: She is alert and oriented to person, place, and time.  Pt states she can't use her left arm and leg, however, when they are manipulated she has normal tone, can feel resistance. But when lifted, she has 0/5 UE and LE.   Skin: Skin is warm. She is not diaphoretic.    ED Course  Procedures (including critical care time)  Labs Reviewed  COMPREHENSIVE METABOLIC PANEL - Abnormal; Notable for the following:    Glucose, Bld 101 (*)    Albumin 3.4 (*)    All other components within normal limits  ETHANOL  PROTIME-INR  APTT  CBC  DIFFERENTIAL  TROPONIN I  URINE RAPID DRUG SCREEN (HOSP PERFORMED)  URINALYSIS, ROUTINE W REFLEX MICROSCOPIC  POCT I-STAT, CHEM 8  POCT I-STAT TROPONIN I    Ct Head Wo Contrast  08/02/2012  *RADIOLOGY REPORT*  Clinical Data: Left-sided weakness.  Slurred speech.  CT HEAD WITHOUT CONTRAST  Technique:  Contiguous axial images were obtained from the base of the skull through the vertex without contrast.  Comparison: 02/16/2012  Findings: Bone windows demonstrate clear paranasal sinuses and mastoid air cells.  Soft tissue windows demonstrate no  mass lesion, hemorrhage, hydrocephalus, acute infarct, intra-axial, or extra-axial fluid collection.  IMPRESSION: Normal head CT.  These results were called by telephone on 08/02/2012 at 8:43 a.m. to Dr. Thad Ranger, who verbally acknowledged these results.   Original Report Authenticated By: Jeronimo Greaves, M.D.    Mr Brain Wo Contrast  08/02/2012  *RADIOLOGY REPORT*  Clinical Data: Headache.  Slurred speech.  Left-sided weakness. Diabetic patient with hyperlipidemia.  Migraine headaches.  MRI HEAD WITHOUT CONTRAST  Technique:  Multiplanar, multiecho pulse sequences of the brain and surrounding structures were obtained according to standard protocol without intravenous contrast.  Comparison: 08/02/2012 CT.  01/20/2012 MR.  Findings: No acute infarct.  No intracranial hemorrhage.  No intracranial mass lesion detected on this unenhanced exam.  No hydrocephalus.  Major intracranial vascular structures are patent.  Decreased signal intensity of bone marrow may be related to patient's habitus or possibly result of underlying anemia. Appearance is without change.  Cervical medullary junction, pituitary region, pineal region and orbital structures unremarkable.  IMPRESSION: No acute abnormality.  Please see above   Original Report Authenticated By: Lacy Duverney, M.D.       MDM   Results for orders placed during the hospital encounter of 08/02/12 (from the past 24 hour(s))  ETHANOL     Status: None   Collection Time    08/02/12  8:24 AM      Result Value Range   Alcohol, Ethyl (B) <11  0 - 11 mg/dL  PROTIME-INR     Status:  None   Collection Time    08/02/12  8:24 AM      Result Value Range   Prothrombin Time 14.6  11.6 - 15.2 seconds   INR 1.16  0.00 - 1.49  APTT     Status: None   Collection Time    08/02/12  8:24 AM      Result Value Range   aPTT 35  24 - 37 seconds  CBC     Status: None   Collection Time    08/02/12  8:24 AM      Result Value Range   WBC 8.1  4.0 - 10.5 K/uL   RBC 4.57  3.87 - 5.11 MIL/uL   Hemoglobin 12.0  12.0 - 15.0 g/dL   HCT 16.1  09.6 - 04.5 %   MCV 82.9  78.0 - 100.0 fL   MCH 26.3  26.0 - 34.0 pg   MCHC 31.7  30.0 - 36.0 g/dL   RDW 40.9  81.1 - 91.4 %   Platelets 234  150 - 400 K/uL  DIFFERENTIAL     Status: None   Collection Time    08/02/12  8:24 AM      Result Value Range   Neutrophils Relative 66  43 - 77 %   Neutro Abs 5.4  1.7 - 7.7 K/uL   Lymphocytes Relative 27  12 - 46 %   Lymphs Abs 2.2  0.7 - 4.0 K/uL   Monocytes Relative 6  3 - 12 %   Monocytes Absolute 0.5  0.1 - 1.0 K/uL   Eosinophils Relative 2  0 - 5 %   Eosinophils Absolute 0.1  0.0 - 0.7 K/uL   Basophils Relative 0  0 - 1 %   Basophils Absolute 0.0  0.0 - 0.1 K/uL  COMPREHENSIVE METABOLIC PANEL     Status: Abnormal   Collection Time    08/02/12  8:24 AM      Result Value Range   Sodium 142  135 - 145 mEq/L   Potassium 4.1  3.5 - 5.1 mEq/L   Chloride 103  96 - 112 mEq/L   CO2 32  19 - 32 mEq/L   Glucose, Bld 101 (*) 70 - 99 mg/dL   BUN 14  6 - 23 mg/dL   Creatinine, Ser 7.82  0.50 - 1.10 mg/dL   Calcium 8.9  8.4 - 95.6 mg/dL   Total Protein 6.9  6.0 - 8.3 g/dL   Albumin 3.4 (*) 3.5 - 5.2 g/dL   AST 14  0 - 37 U/L   ALT 19  0 - 35 U/L   Alkaline Phosphatase 79  39 - 117  U/L   Total Bilirubin 0.3  0.3 - 1.2 mg/dL   GFR calc non Af Amer >90  >90 mL/min   GFR calc Af Amer >90  >90 mL/min  POCT I-STAT TROPONIN I     Status: None   Collection Time    08/02/12  8:32 AM      Result Value Range   Troponin i, poc 0.00  0.00 - 0.08 ng/mL   Comment 3           POCT I-STAT, CHEM 8      Status: None   Collection Time    08/02/12  8:34 AM      Result Value Range   Sodium 142  135 - 145 mEq/L   Potassium 4.1  3.5 - 5.1 mEq/L   Chloride 102  96 - 112 mEq/L   BUN 14  6 - 23 mg/dL   Creatinine, Ser 1.61  0.50 - 1.10 mg/dL   Glucose, Bld 98  70 - 99 mg/dL   Calcium, Ion 0.96  0.45 - 1.23 mmol/L   TCO2 32  0 - 100 mmol/L   Hemoglobin 12.9  12.0 - 15.0 g/dL   HCT 40.9  81.1 - 91.4 %     NSR, 88, do not see changes consistent w/ acute ischemia or acute conduction abnormalities, no pathologic ST wave changes.   Neurology team has ruled out stroke, suspect conversion d/o vs migraine HA. Will give pt migraine cocktail and pt has family members to care for her at home 24/7 -- they feel comfortable taking care of her at home and pt states she would rather go home. Her HA has improved and she states her weakness usually takes, "a few days" to get better -- she has had similar symptoms prior -- and they resolved on their own (prior stroke workups per patient have been negative).   I have talked to her father, who is comfortable caring for her at home, and states that pt's husband will be helping out as well, and she will have family around the clock care.   She has good pcp f/u and family states they will take her to pcp tomorrow for re-evaluation.   1. Left-sided weakness             Bernadene Person, MD 08/05/12 2009

## 2012-08-02 NOTE — ED Notes (Signed)
Per EMS pt from home with c/o left sided weakness, abnormal gait and slurred speech. Pt reports when she got out of bed this am, she fell to ground due inability to walk/stand. CBG 108. VSS. Alert, not oriented to time. Hx of hemiplegic migraines. Unable to obtain IV.

## 2012-08-02 NOTE — Code Documentation (Signed)
37 yo female who apparently went to bed in her usual state of health at 2200 last night.  She awoke at 0600 and fell trying to get OOB.  Her husband assisted her up and went on to take the kids to school.  Her father-in-law noticed speech problems and L side weakness.  He called 9-1-1 and EMS activated code stroke at [phone].  She arrived in the ED at [facility] and was cleared by the EDP at [phone].  Stroke team was here at 0817.  Pt was taken to CT which was unremarkable.  Pt reports h/o hemiplegic migraines, but that she did not have a h/a until she fell this AM and reportedly hit her head.  The NIHSS is 16:  See doc flowsheet. Questionable effort, and splits the midline for sensory. Her speech is slow, sl slurred but no word finding problems. Hand off done with ED RN.  Code stroke canceled by Dr. Thad Ranger.

## 2012-08-05 NOTE — ED Provider Notes (Signed)
I have supervised the resident on the management of this patient and agree with the note above. I personally interviewed and examined the patient and my addendum is below.   [name] is a 37 y.o. female hx of DM, conversion disorder here with L sided weakness and facial droop and slurred speech. She came in a code stroke and neuro was at bedside. She said that it was similar to her previous strokes but on chart review, she was never diagnosed with strokes but had conversion disorder and complicated migraine. I am suspecting that this is the case as well. CT head and MRI brain showed no acute stroke. She was given migraine cocktail and felt slightly better. She was able to speak better and would rather go home. Dr. Thad Ranger from neurology also agreed with the plan. She had prior stroke workups that were negative.   Richardean Canal, MD 08/05/12 2015

## 2012-08-08 DIAGNOSIS — M542 Cervicalgia: Secondary | ICD-10-CM | POA: Diagnosis not present

## 2012-08-08 DIAGNOSIS — IMO0001 Reserved for inherently not codable concepts without codable children: Secondary | ICD-10-CM | POA: Diagnosis not present

## 2012-08-08 DIAGNOSIS — R51 Headache: Secondary | ICD-10-CM | POA: Diagnosis not present

## 2012-08-08 DIAGNOSIS — M531 Cervicobrachial syndrome: Secondary | ICD-10-CM | POA: Diagnosis not present

## 2012-08-18 ENCOUNTER — Telehealth (INDEPENDENT_AMBULATORY_CARE_PROVIDER_SITE_OTHER): Payer: Self-pay

## 2012-08-18 NOTE — Telephone Encounter (Signed)
Patient calling into office to see if she's cleared to have steroid injections in her back due to a recent injury.  Reviewed with Dr. Johna Sheriff and patient cleared for short term injections only. Patient called and advised of Dr. Jamse Mead recommendations for back injections from her Orthopedist

## 2012-08-29 ENCOUNTER — Encounter: Payer: Self-pay | Admitting: *Deleted

## 2012-08-29 ENCOUNTER — Encounter: Payer: BC Managed Care – PPO | Attending: General Surgery | Admitting: *Deleted

## 2012-08-29 DIAGNOSIS — Z713 Dietary counseling and surveillance: Secondary | ICD-10-CM | POA: Insufficient documentation

## 2012-08-29 DIAGNOSIS — Z01818 Encounter for other preprocedural examination: Secondary | ICD-10-CM | POA: Insufficient documentation

## 2012-08-29 NOTE — Patient Instructions (Addendum)
Goals:  Follow Phase 3B: High Protein + Non-Starchy Vegetables  Increase lean protein foods to meet 60-80g goal  Continue fluid intake of 64oz +  Add 15 grams of carbohydrate (fruit, whole grain, starchy vegetable) with meals  Aim for >30 min of physical activity daily as able

## 2012-08-29 NOTE — Progress Notes (Addendum)
  Follow-up visit:  9 Months Post-Operative RYGB Surgery  Medical Nutrition Therapy:  Appt start time: 1000  End time:  1030.  Primary concerns today:  Post-operative Bariatric Surgery Nutrition Management. [name] returns today with highly increased stress in her personal life. Also reports fluid-filled cyst on right hip that was drained several weeks ago and now draining again on its own. Ulcers have resolved, though some still mild residual pain in ULQ. Still craving sweets, but not eating many.  Is on hiatus from BELT program d/t migraines and lack of time to attend. Mows lawn at home and tries to walk several days a week. Still plans weekly menus.   Surgery date: 11/30/11 Surgery type: RYGB Start weight @ [facility]: 290.5 lbs (09/04/11)  Weight today: 206.0 lbs Weight change: 0.5 lb (GAIN) Total weight lost: 84.5 lbs BMI: 32.3 kg/m^2  Weight loss goal: 175 lbs % goal met: 73%  TANITA BODY COMP RESULTS   09/04/11  12/15/11  12/29/11 01/26/12 02/25/12 04/07/12 05/04/12 06/06/12 08/29/12  Fat Mass (lbs)  157.0  135.0  133.5 124.0 112.0 98.5 91.5 91.0 87.0  Fat Free Mass (lbs)  133.5  134.5  122.5 125.5 123.5 117.5 116.0 114.5 119.0  Total Body Water (lbs)  97.5  98.5  89.5 92.0 90.5 86.0 85.0 84.0 87.0   Fluid intake:  64+ oz Estimated total protein intake: Salmon (2 oz), 2-3 oz protein at other meals, 1-2 eggs at breakfast some days = 60g    Medications: Reconciled with pt at visit Supplementation: Taking as directed  CBG monitoring: No longer checking Average CBG per patient:  N/A Last patient reported A1c:  5.1%  Lab Results  Component Value Date   HGBA1C 6.3* 03/04/2012   Using straws: No Drinking while eating: Usually not Hair loss: Moderate - likely d/t low iron or increased physical stress from back pain/cyst Carbonated beverages: No N/V/D/C:  Nausea with 85% lean hamburger this past weekend Dumping syndrome: None  Recent physical activity:  On hiatus from BELT program d/t  migraines. Mows lawn with push mower and walks 2-3 days/week for 20-30 min   Progress Towards Goal(s):  In progress.   Nutritional Diagnosis:  [place]-3.3 Overweight/obesity As related to recent RYGB surgery.  As evidenced by patient attempting to follow post-op nutritional guidelines for continued weight loss.   Intervention:  Nutrition education/diet advancement.  Samples given during visit include:   Premier Protein Shake: 2 bottles Lot: [id number]; Exp: 04/04/13  Monitoring/Evaluation:  Dietary intake, exercise, and body weight. Follow up in 3 months for 12 month post-op visit.

## 2012-09-01 ENCOUNTER — Ambulatory Visit: Payer: BC Managed Care – PPO | Admitting: *Deleted

## 2012-09-06 ENCOUNTER — Telehealth (INDEPENDENT_AMBULATORY_CARE_PROVIDER_SITE_OTHER): Payer: Self-pay | Admitting: General Surgery

## 2012-09-06 NOTE — Telephone Encounter (Signed)
Called and spoke to patient regarding Steroid Injections from Dr. Ethelene Hal.  Reviewed with patient our last conversation on 08/18/12 of Dr. Johna Sheriff recommendations for short term use of Steroid Injections.  Patient reminded of upcoming appointment on 09/08/12 @ 9:00 am w/Dr. Johna Sheriff and advised at that time to discuss in further detail his recommendations of Steroid Injections from Dr. Ethelene Hal.  Patient reports having RUQ discomfort a few day's ago.  Patient reports no other symptoms during that time of discomfort.  Patient reports that she has increased her activity and since she reports having an increase in physical pain.  Patient reports that she has been prescribed Diluadid for pain now.  Patient agrees and will discuss at the time of her follow up appt on 09/08/12.

## 2012-09-06 NOTE — Telephone Encounter (Signed)
Patient called and stated: She knows Dr.Hoxworth doesn't want her to have Steroid shots, but she wants to know if he will ok her to have one at Dr.Ramos office" Patient has appointment on 6/19 @ 9 with Dr.Hoxworth. I advised patient to speak with Dr.Hoxworth at that appointment.

## 2012-09-08 ENCOUNTER — Encounter (INDEPENDENT_AMBULATORY_CARE_PROVIDER_SITE_OTHER): Payer: Self-pay | Admitting: General Surgery

## 2012-09-08 ENCOUNTER — Telehealth (INDEPENDENT_AMBULATORY_CARE_PROVIDER_SITE_OTHER): Payer: Self-pay

## 2012-09-08 ENCOUNTER — Ambulatory Visit (INDEPENDENT_AMBULATORY_CARE_PROVIDER_SITE_OTHER): Payer: BC Managed Care – PPO | Admitting: General Surgery

## 2012-09-08 DIAGNOSIS — Z09 Encounter for follow-up examination after completed treatment for conditions other than malignant neoplasm: Secondary | ICD-10-CM

## 2012-09-08 DIAGNOSIS — Z6832 Body mass index (BMI) 32.0-32.9, adult: Secondary | ICD-10-CM

## 2012-09-08 DIAGNOSIS — K912 Postsurgical malabsorption, not elsewhere classified: Secondary | ICD-10-CM

## 2012-09-08 NOTE — Telephone Encounter (Signed)
Called Dr. Allen Kell office to request copy of recent lab work.  Patient last lab work from Dr. Emelda Brothers' office was in May 2014.  I do have access to this via EPIC.

## 2012-09-08 NOTE — Telephone Encounter (Signed)
Pt called to notify you that she did get labs drawn at [facility] on 05/16/12 but [facility] put the lab date in wrong for 04/15/12. The pt wants you to look at the labs to see if the pt needs to have more bloodwork done if so please advise the pt to do so. The pt is taking B12 and iron. Pls advise.

## 2012-09-08 NOTE — Patient Instructions (Signed)
Try to add protein to each meal in an effort to reduce your carbohydrate intake Please check with your neurologist to see if a sleep apnea repeat study is necessary. Anyway to have a little activity into your daily routine will help

## 2012-09-08 NOTE — Progress Notes (Signed)
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 She had comorbidities of insulin-dependent diabetes mellitus and obstructive sleep apnea and musculoskeletal pain. She had a marginal ulcer postoperatively an Endo about 4 months ago showed significant improvement though not complete resolution. Since I saw her last she is concerned that she is able to eat more and is having trouble staying away from carbohydrates and has been on a few pounds. However her last hemoglobin A1c was 5.1 on no diabetic medications. She has significant back pain due to herniated discs in the sitting a pain specialist is on Dilaudid. She still has some left upper quadrant abdominal pain but not quite as bad as what her ulcer was most active and also has no nausea or vomiting or other GI complaints.  Exam: BP 110/78  Pulse 78  Temp(Src) 97.9 F (36.6 C) (Oral)  Resp 18  Ht 5\' 7"  (1.702 m)  Wt 208 lb 3.2 oz (94.439 kg)  BMI 32.6 kg/m2 Total weight loss 78 pounds, at 7 pounds from last visit General: Affect is a little brighter than in the past. No distress. Lungs: Clear breath sounds Cardiac: Regular in rhythm. No edema. Abdomen: Mild epigastric and left upper quadrant tenderness. No guarding or masses. No organomegaly.  Assessment and plan: Status post Roux-en-Y gastric bypass. History of marginal ulcer which appeared markedly improved the last endoscopy. She still has some upper abdominal pain but for food tolerance and slight weight gain I doubt that she has an active ulcer. We will monitor this for now and continue Protonix. She has comorbidities of diabetes which appears resolved and sleep apnea appears resolved and current joint pain. She's recently seen a dietitian. We discussed strategies for trying to increase her protein intake and possibilities to incrementally increased her activity level. Our goal is to  maintain her weight loss. Return in 3 months. Lab work has been done by Dr. Lucianne Muss and we will get a copy.

## 2012-09-09 ENCOUNTER — Telehealth (INDEPENDENT_AMBULATORY_CARE_PROVIDER_SITE_OTHER): Payer: Self-pay

## 2012-09-09 NOTE — Telephone Encounter (Signed)
Called and spoke to patient regarding OTC medication she's taking for constipation.  Per Dr. Johna Sheriff patient need's to start taking Miralax.  Patient need's to increase dose daily until she has a bowel movement.  Patient advised to stop taking Milk of Magnesia and begin taking Miralax. Patient advised to increase fluids.  Patient reports she fell out of her daughters bed last night and would like for me to make Dr. Johna Sheriff aware.  Patient states she has an appointment today with Dr. Ethelene Hal for further evaluation and assessment of her fall from last night.

## 2012-09-15 ENCOUNTER — Telehealth (INDEPENDENT_AMBULATORY_CARE_PROVIDER_SITE_OTHER): Payer: Self-pay

## 2012-09-15 NOTE — Addendum Note (Signed)
Addended by: Maryan Puls on: 09/15/2012 02:19 PM   Modules accepted: Orders

## 2012-09-15 NOTE — Telephone Encounter (Signed)
Pt calling to give FYI about the appt w/Dr Ramos today. The pt only received Lidocaine injections not the steroid injections today.

## 2012-09-18 ENCOUNTER — Emergency Department (HOSPITAL_COMMUNITY): Payer: BC Managed Care – PPO

## 2012-09-18 ENCOUNTER — Encounter (HOSPITAL_COMMUNITY): Payer: Self-pay

## 2012-09-18 ENCOUNTER — Emergency Department (HOSPITAL_COMMUNITY)
Admission: EM | Admit: 2012-09-18 | Discharge: 2012-09-18 | Disposition: A | Discharge status: Home / Self Care | Payer: BC Managed Care – PPO | Attending: Emergency Medicine | Admitting: Emergency Medicine

## 2012-09-18 DIAGNOSIS — R1012 Left upper quadrant pain: Secondary | ICD-10-CM

## 2012-09-18 DIAGNOSIS — F329 Major depressive disorder, single episode, unspecified: Secondary | ICD-10-CM | POA: Insufficient documentation

## 2012-09-18 DIAGNOSIS — Z862 Personal history of diseases of the blood and blood-forming organs and certain disorders involving the immune mechanism: Secondary | ICD-10-CM | POA: Insufficient documentation

## 2012-09-18 DIAGNOSIS — K219 Gastro-esophageal reflux disease without esophagitis: Secondary | ICD-10-CM | POA: Insufficient documentation

## 2012-09-18 DIAGNOSIS — E1142 Type 2 diabetes mellitus with diabetic polyneuropathy: Secondary | ICD-10-CM | POA: Insufficient documentation

## 2012-09-18 DIAGNOSIS — F411 Generalized anxiety disorder: Secondary | ICD-10-CM | POA: Insufficient documentation

## 2012-09-18 DIAGNOSIS — Z3202 Encounter for pregnancy test, result negative: Secondary | ICD-10-CM | POA: Insufficient documentation

## 2012-09-18 DIAGNOSIS — G43909 Migraine, unspecified, not intractable, without status migrainosus: Secondary | ICD-10-CM | POA: Insufficient documentation

## 2012-09-18 DIAGNOSIS — F988 Other specified behavioral and emotional disorders with onset usually occurring in childhood and adolescence: Secondary | ICD-10-CM | POA: Insufficient documentation

## 2012-09-18 DIAGNOSIS — R011 Cardiac murmur, unspecified: Secondary | ICD-10-CM | POA: Insufficient documentation

## 2012-09-18 DIAGNOSIS — K279 Peptic ulcer, site unspecified, unspecified as acute or chronic, without hemorrhage or perforation: Secondary | ICD-10-CM

## 2012-09-18 DIAGNOSIS — Z8639 Personal history of other endocrine, nutritional and metabolic disease: Secondary | ICD-10-CM | POA: Insufficient documentation

## 2012-09-18 DIAGNOSIS — F3289 Other specified depressive episodes: Secondary | ICD-10-CM | POA: Insufficient documentation

## 2012-09-18 DIAGNOSIS — E1149 Type 2 diabetes mellitus with other diabetic neurological complication: Secondary | ICD-10-CM | POA: Insufficient documentation

## 2012-09-18 DIAGNOSIS — G40909 Epilepsy, unspecified, not intractable, without status epilepticus: Secondary | ICD-10-CM | POA: Insufficient documentation

## 2012-09-18 DIAGNOSIS — Z880 Allergy status to penicillin: Secondary | ICD-10-CM | POA: Insufficient documentation

## 2012-09-18 DIAGNOSIS — K319 Disease of stomach and duodenum, unspecified: Secondary | ICD-10-CM | POA: Insufficient documentation

## 2012-09-18 DIAGNOSIS — Z79899 Other long term (current) drug therapy: Secondary | ICD-10-CM | POA: Insufficient documentation

## 2012-09-18 DIAGNOSIS — Z8739 Personal history of other diseases of the musculoskeletal system and connective tissue: Secondary | ICD-10-CM | POA: Insufficient documentation

## 2012-09-18 LAB — COMPREHENSIVE METABOLIC PANEL
ALT: 21 U/L (ref 0–35)
Albumin: 3.6 g/dL (ref 3.5–5.2)
Alkaline Phosphatase: 92 U/L (ref 39–117)
Potassium: 4 mEq/L (ref 3.5–5.1)
Sodium: 134 mEq/L — ABNORMAL LOW (ref 135–145)
Total Protein: 6.9 g/dL (ref 6.0–8.3)

## 2012-09-18 LAB — CBC WITH DIFFERENTIAL/PLATELET
Basophils Absolute: 0 10*3/uL (ref 0.0–0.1)
Eosinophils Absolute: 0.3 10*3/uL (ref 0.0–0.7)
Lymphocytes Relative: 28 % (ref 12–46)
MCHC: 32.5 g/dL (ref 30.0–36.0)
Neutrophils Relative %: 64 % (ref 43–77)
RDW: 13.9 % (ref 11.5–15.5)

## 2012-09-18 LAB — URINALYSIS, ROUTINE W REFLEX MICROSCOPIC
Bilirubin Urine: NEGATIVE
Nitrite: NEGATIVE
Specific Gravity, Urine: 1.013 (ref 1.005–1.030)
pH: 6.5 (ref 5.0–8.0)

## 2012-09-18 LAB — URINE MICROSCOPIC-ADD ON

## 2012-09-18 LAB — LIPASE, BLOOD: Lipase: 15 U/L (ref 11–59)

## 2012-09-18 LAB — RAPID URINE DRUG SCREEN, HOSP PERFORMED
Barbiturates: NOT DETECTED
Opiates: POSITIVE — AB
Tetrahydrocannabinol: NOT DETECTED

## 2012-09-18 LAB — POCT PREGNANCY, URINE: Preg Test, Ur: NEGATIVE

## 2012-09-18 MED ORDER — HYDROMORPHONE HCL PF 1 MG/ML IJ SOLN
1.0000 mg | Freq: Once | INTRAMUSCULAR | Status: AC
  Administered 2012-09-18: 1 mg via INTRAVENOUS
  Filled 2012-09-18: qty 1

## 2012-09-18 MED ORDER — PANTOPRAZOLE SODIUM 40 MG IV SOLR
40.0000 mg | Freq: Once | INTRAVENOUS | Status: AC
  Administered 2012-09-18: 40 mg via INTRAVENOUS
  Filled 2012-09-18: qty 40

## 2012-09-18 MED ORDER — PROMETHAZINE HCL 25 MG/ML IJ SOLN
12.5000 mg | Freq: Once | INTRAMUSCULAR | Status: AC
  Administered 2012-09-18: 12.5 mg via INTRAVENOUS
  Filled 2012-09-18 (×2): qty 1

## 2012-09-18 MED ORDER — SODIUM CHLORIDE 0.9 % IV SOLN
1000.0000 mL | INTRAVENOUS | Status: DC
  Administered 2012-09-18: 1000 mL via INTRAVENOUS

## 2012-09-18 MED ORDER — SUCRALFATE 1 G PO TABS: 1.0000 g | ORAL_TABLET | Freq: Four times a day (QID) | ORAL | Status: DC

## 2012-09-18 MED ORDER — SODIUM CHLORIDE 0.9 % IV SOLN
1000.0000 mL | Freq: Once | INTRAVENOUS | Status: AC
  Administered 2012-09-18: 1000 mL via INTRAVENOUS

## 2012-09-18 MED ORDER — IOHEXOL 300 MG/ML  SOLN
50.0000 mL | Freq: Once | INTRAMUSCULAR | Status: AC | PRN
  Administered 2012-09-18: 50 mL via ORAL

## 2012-09-18 MED ORDER — PROMETHAZINE HCL 25 MG PO TABS: 25.0000 mg | ORAL_TABLET | Freq: Four times a day (QID) | ORAL | Status: DC | PRN

## 2012-09-18 MED ORDER — PANTOPRAZOLE SODIUM 40 MG PO TBEC: DELAYED_RELEASE_TABLET | ORAL | Status: DC

## 2012-09-18 NOTE — ED Notes (Signed)
She c/o left upper abd. Pain radiating into upper back since Fri. (2 days ago).  She states she has vomited a few times also.  She tells [place] she has hx of roux-n-y bypass Sep't. 2013.

## 2012-09-18 NOTE — ED Provider Notes (Signed)
History    CSN: [id number] Arrival date & time 09/18/12  1036  First MD Initiated Contact with Patient 09/18/12 1112     Chief Complaint  Patient presents with  . 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 She reports she had a ulcer at the anastomosis that had improved when they did a repeat endoscopy in February. She states last night she started having sharp left upper quadrant pain but it's been constant with nausea and vomiting 5-6 times. She denies any diarrhea. She states it feels just like the pain she had when she had her ulcer. She states her neck feels tight and she has had normal urination. She denies any fever. She denies any blood in her vomiting today. She denies chest pain or shortness of breath. She states she was just seen by Dr. Johna Sheriff last week and everything was fine.  Patient was noted to have a club stamp on her hand, she states she did go out last night however she states she did not drink any alcohol, she only drank water.  PCP Dr Vivien Rota at [facility] Neurologist Dr Monica Becton Pain management Dr Ramos--states she had an MRI on June 4 Surgeon Dr Johna Sheriff  Past Medical History  Diagnosis Date  . Depression   . Anxiety   . Diabetes mellitus   . GERD (gastroesophageal reflux disease)   . ADD (attention deficit disorder)   . Hyperlipemia   . Herniated disc   . Allergy     Seasonal  . Diabetic neuropathy   . Shortness of breath     with exertion   . Sleep apnea     obsructive settings at 13   . Heart murmur     asa child  . Seizures     hx of / month ago - dehydration   . 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 Degenerative disc disease   . Annular disc tear   . Retrolisthesis     L4 and L5  . Bulging disc    Past Surgical History  Procedure  Laterality Date  . Cholecystectomy    . Cesarean section    . Tonsillectomy    . Laporoscopy  2007  . Breath tek h pylori  09/11/2011    Procedure: BREATH TEK H PYLORI;  Surgeon: Mariella Saa, MD;  Location: [facility];  Service: General;  Laterality: N/A;  . 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 Bowel resection  11/30/2011    Procedure: SMALL BOWEL RESECTION;  Surgeon: Mariella Saa, MD;  Location: [facility];  Service: General;;  . Esophagogastroduodenoscopy  03/14/2012    Procedure: ESOPHAGOGASTRODUODENOSCOPY (EGD);  Surgeon: Kandis Cocking, MD;  Location: [facility];  Service: General;  Laterality: N/A;  . Esophagogastroduodenoscopy  04/25/2012    Procedure: ESOPHAGOGASTRODUODENOSCOPY (EGD);  Surgeon: Kandis Cocking, MD;  Location: [facility];  Service: General;  Laterality: N/A;   Family History  Problem Relation Age of Onset  . Diabetes type II    . Diabetes Mother   . Hypertension Mother   . Depression Brother   . Cancer Maternal Aunt     ovarian   . Depression Maternal Aunt   . Diabetes Maternal  Aunt   . Cancer Maternal Grandmother     Lung  . COPD Maternal Grandmother   . Cancer Maternal Grandfather     Prostate  . Diabetes Maternal Grandfather   . Hearing loss Maternal Grandfather   . Hypertension Maternal Grandfather   . Stroke Maternal Grandfather   . Cancer Paternal Grandmother     Colon   History  Substance Use Topics  . Smoking status: Never Smoker   . Smokeless tobacco: Never Used  . Alcohol Use: No   Lives at home On disability for hemiplegic migraines   OB History   Grav Para Term Preterm Abortions TAB SAB Ect Mult Living                 Review of Systems  All other systems reviewed and are negative.    Allergies  Atarax; Cephalosporins; Codeine; Compazine; Decadron; Demerol; Metoclopramide hcl;  Morphine and related; Penicillins; Sulfa antibiotics; Triptans; Zofran; Depo-medrol; Pork-derived products; and Shellfish-derived products  Home Medications   Current Outpatient Rx  Name  Route  Sig  Dispense  Refill  . ALPRAZolam (XANAX) 1 MG tablet   Oral   Take 1-2 mg by mouth 4 (four) times daily as needed for anxiety. For anxiety         . calcium-vitamin D (OSCAL WITH D) 500-200 MG-UNIT per tablet   Oral   Take 1 tablet by mouth daily.         . Cholecalciferol (CVS VITAMIN D3 PO)   Oral   Take by mouth daily.         . cyclobenzaprine (FLEXERIL) 10 MG tablet   Oral   Take 10 mg by mouth 3 (three) times daily as needed. For headaches         . cyproheptadine (PERIACTIN) 2 MG/5ML syrup      at bedtime. Take 12mls(8mg  total) at bedtime         . doxepin (SINEQUAN) 50 MG capsule   Oral   Take 50 mg by mouth 3 (three) times daily.         . famotidine (PEPCID) 20 MG tablet   Oral   Take 1 tablet (20 mg total) by mouth 2 (two) times daily.   20 tablet   0   . ferrous sulfate (CVS IRON) 325 (65 FE) MG tablet   Oral   Take 325 mg by mouth daily with breakfast.         . HYDROmorphone (DILAUDID) 4 MG tablet   Oral   Take 4 mg by mouth 3 (three) times daily as needed for pain.         Marland Kitchen ketoconazole (NIZORAL) 2 % shampoo   Topical   Apply 1 application topically daily as needed. For scalp itching/pain.         . Methylphenidate HCl ER (QUILLIVANT XR) 25 MG/5ML SUSR   Oral   Take 12 mLs by mouth daily.         . Multiple Vitamins-Minerals (ADULT GUMMY) CHEW   Oral   Chew 2 tablets by mouth daily.         Marland Kitchen OLANZapine (ZYPREXA) 2.5 MG tablet   Oral   Take 2.5 mg by mouth at bedtime. Take up to TID for hemiplegic migraines         . pantoprazole (PROTONIX) 40 MG tablet   Oral   Take 40 mg by mouth 2 times daily at 12 noon and 4 pm. Takes at 12p and 4p         .  promethazine (PHENERGAN) 25 MG/ML injection   Intravenous   Inject 25  mg into the vein 4 (four) times daily as needed.         Marland Kitchen UNABLE TO FIND   Oral   Take 500 mcg by mouth daily. Med Name: Biotin 500 mcg         . vitamin B-12 (CYANOCOBALAMIN) 1000 MCG tablet   Oral   Take 2,500 mcg by mouth at bedtime.            BP 116/72  Pulse 86  Temp(Src) 97.8 F (36.6 C) (Oral)  Resp 18  Ht 5\' 7"  (1.702 m)  Wt 208 lb (94.348 kg)  BMI 32.57 kg/m2  SpO2 100%  Vital signs normal    Physical Exam  Nursing note and vitals reviewed. Constitutional: She is oriented to person, place, and time. She appears well-developed and well-nourished.  Non-toxic appearance. She does not appear ill. No distress.  HENT:  Head: Normocephalic and atraumatic.  Right Ear: External ear normal.  Left Ear: External ear normal.  Nose: Nose normal. No mucosal edema or rhinorrhea.  Mouth/Throat: Oropharynx is clear and moist and mucous membranes are normal. No dental abscesses or edematous.  Tongue is not dry  Eyes: Conjunctivae and EOM are normal. Pupils are equal, round, and reactive to light.  Neck: Normal range of motion and full passive range of motion without pain. Neck supple.  Cardiovascular: Normal rate, regular rhythm and normal heart sounds.  Exam reveals no gallop and no friction rub.   No murmur heard. Pulmonary/Chest: Effort normal and breath sounds normal. No respiratory distress. She has no wheezes. She has no rhonchi. She has no rales. She exhibits no tenderness and no crepitus.  Abdominal: Soft. Normal appearance and bowel sounds are normal. She exhibits no distension. There is tenderness. There is no rebound and no guarding.    Musculoskeletal: Normal range of motion. She exhibits no edema and no tenderness.  Moves all extremities well.   Neurological: She is alert and oriented to person, place, and time. She has normal strength. No cranial nerve deficit.  Skin: Skin is warm, dry and intact. No rash noted. No erythema. No pallor.  Patient has a stamp  on the dorsum of her right hand she states is from going to a comedy club last night  Psychiatric: Her speech is normal and behavior is normal. Her mood appears anxious.       ED Course  Procedures (including critical care time)  Medications  0.9 %  sodium chloride infusion (1,000 mLs Intravenous New Bag/Given 09/18/12 1300)    Followed by  0.9 %  sodium chloride infusion (1,000 mLs Intravenous New Bag/Given 09/18/12 1452)  HYDROmorphone (DILAUDID) injection 1 mg (1 mg Intravenous Given 09/18/12 1241)  promethazine (PHENERGAN) injection 12.5 mg (12.5 mg Intravenous Given 09/18/12 1452)  pantoprazole (PROTONIX) injection 40 mg (40 mg Intravenous Given 09/18/12 1241)  iohexol (OMNIPAQUE) 300 MG/ML solution 50 mL (50 mLs Oral Contrast Given 09/18/12 1247)  HYDROmorphone (DILAUDID) injection 1 mg (1 mg Intravenous Given 09/18/12 1453)     Review of patient's prior charts shows she was seen by the dietitian on June 9 at that time she was complaining of left upper quadrant pain. She also was seen by Dr. Johna Sheriff on June 19 and at that point she was still having left upper quadrant pain. When I talk to patient she implied the pain had just started. Patient did indeed have endoscopy done in February by Dr.  Newman which showed improving marginal ulcerations. Patient presented in May with left-sided weakness as a code stroke. However her CT and MRI were normal. She was discharged from the ED.   Review of the [place] controlled substance site shows patient has had 9 prescriptions for narcotics in the past 6 months, including #30 dilaudid 4 mg tablets on June 11 and #30 dilaudid 4 mg tablets on June 20 , and #30 hydromorphone 4 mg tablets on May 29. She is also getting benzo's and amphetamines.   14:28 Dr Gerrit Friends, discussed patient. Feels she can go home, continue her PPI, try to get back on carafate and he will leave message for Dr Johna Sheriff and Dr Ezzard Standing about her ED visit today.   Pt given her  discharge instructions.      Results for orders placed during the hospital encounter of 09/18/12  CBC WITH DIFFERENTIAL      Result Value Range   WBC 8.8  4.0 - 10.5 K/uL   RBC 5.27 (*) 3.87 - 5.11 MIL/uL   Hemoglobin 13.8  12.0 - 15.0 g/dL   HCT 16.1  09.6 - 04.5 %   MCV 80.6  78.0 - 100.0 fL   MCH 26.2  26.0 - 34.0 pg   MCHC 32.5  30.0 - 36.0 g/dL   RDW 40.9  81.1 - 91.4 %   Platelets    150 - 400 K/uL   Value: PLATELET CLUMPS NOTED ON SMEAR, COUNT APPEARS ADEQUATE   Neutrophils Relative % 64  43 - 77 %   Lymphocytes Relative 28  12 - 46 %   Monocytes Relative 5  3 - 12 %   Eosinophils Relative 3  0 - 5 %   Basophils Relative 0  0 - 1 %   Neutro Abs 5.6  1.7 - 7.7 K/uL   Lymphs Abs 2.5  0.7 - 4.0 K/uL   Monocytes Absolute 0.4  0.1 - 1.0 K/uL   Eosinophils Absolute 0.3  0.0 - 0.7 K/uL   Basophils Absolute 0.0  0.0 - 0.1 K/uL   Smear Review MORPHOLOGY UNREMARKABLE    COMPREHENSIVE METABOLIC PANEL      Result Value Range   Sodium 134 (*) 135 - 145 mEq/L   Potassium 4.0  3.5 - 5.1 mEq/L   Chloride 97  96 - 112 mEq/L   CO2 26  19 - 32 mEq/L   Glucose, Bld 94  70 - 99 mg/dL   BUN 8  6 - 23 mg/dL   Creatinine, Ser 7.82  0.50 - 1.10 mg/dL   Calcium 9.1  8.4 - 95.6 mg/dL   Total Protein 6.9  6.0 - 8.3 g/dL   Albumin 3.6  3.5 - 5.2 g/dL   AST 21  0 - 37 U/L   ALT 21  0 - 35 U/L   Alkaline Phosphatase 92  39 - 117 U/L   Total Bilirubin 0.4  0.3 - 1.2 mg/dL   GFR calc non Af Amer >90  >90 mL/min   GFR calc Af Amer >90  >90 mL/min  LIPASE, BLOOD      Result Value Range   Lipase 15  11 - 59 U/L  URINALYSIS, ROUTINE W REFLEX MICROSCOPIC      Result Value Range   Color, Urine YELLOW  YELLOW   APPearance TURBID (*) CLEAR   Specific Gravity, Urine 1.013  1.005 - 1.030   pH 6.5  5.0 - 8.0   Glucose, UA NEGATIVE  NEGATIVE mg/dL  Hgb urine dipstick SMALL (*) NEGATIVE   Bilirubin Urine NEGATIVE  NEGATIVE   Ketones, ur NEGATIVE  NEGATIVE mg/dL   Protein, ur NEGATIVE  NEGATIVE  mg/dL   Urobilinogen, UA 0.2  0.0 - 1.0 mg/dL   Nitrite NEGATIVE  NEGATIVE   Leukocytes, UA LARGE (*) NEGATIVE  URINE MICROSCOPIC-ADD ON      Result Value Range   Squamous Epithelial / LPF MANY (*) RARE   WBC, UA TOO NUMEROUS TO COUNT  <3 WBC/hpf   RBC / HPF 0-2  <3 RBC/hpf   Bacteria, UA MANY (*) RARE  URINE RAPID DRUG SCREEN (HOSP PERFORMED)      Result Value Range   Opiates POSITIVE (*) NONE DETECTED   Cocaine NONE DETECTED  NONE DETECTED   Benzodiazepines POSITIVE (*) NONE DETECTED   Amphetamines NONE DETECTED  NONE DETECTED   Tetrahydrocannabinol NONE DETECTED  NONE DETECTED   Barbiturates NONE DETECTED  NONE DETECTED  POCT PREGNANCY, URINE      Result Value Range   Preg Test, Ur NEGATIVE  NEGATIVE   Laboratory interpretation all normal except possible UTI however her urine sample is contaminated   Ct Abdomen Pelvis Wo Contrast  09/18/2012   *RADIOLOGY REPORT*  Clinical Data: Left upper quadrant abdominal pain.  Prior gastric bypass.  CT ABDOMEN AND PELVIS WITHOUT CONTRAST  Technique:  Multidetector CT imaging of the abdomen and pelvis was performed following the standard protocol without intravenous contrast.  Comparison: Multiple exams, including 04/07/2012 and 04/24/2012  Findings: Dependent subsegmental atelectasis observed in both lower lobes.  Gastrojejunostomy appears patent with linear high signal along the anastomotic site to the favors staple line over ulceration.  No free intraperitoneal gas or definite leak of contrast.  The visualized portion of the liver, spleen, pancreas, and adrenal glands appear unremarkable in noncontrast CT appearance.  Gallbladder surgically absent. The kidneys appear unremarkable, as do the proximal ureters.  IUD appears satisfactorily positioned in the uterus.  Adnexa unremarkable.  Right inguinal lymph node 1.1 cm, within normal limits for location.  Appendix appears normal. Prominence of stool throughout the colon suggests constipation.  There  is sclerosis along the iliac side of both sacroiliac joints.  IMPRESSION: 1.  Prominence of stool throughout the colon suggests constipation.  2.  Sclerosis along the iliac side of the sacroiliac joints, query mild osteitis condensans ilii. 3.  Gastrojejunostomy appears patent.  Linear increased signal along the anastomotic site is attributable to anastomotic staples; I do not see a definite ulceration in this region, nor is there extraluminal gas or contrast to suggest a leak.  Subtle ulceration might be better appreciated at endoscopy.   Original Report Authenticated By: Gaylyn Rong, M.D.      1. LUQ abdominal pain   2. PUD (peptic ulcer disease)       New Prescriptions   PANTOPRAZOLE (PROTONIX) 40 MG TABLET    Take 1 po BID x 2 weeks then once a day   PROMETHAZINE (PHENERGAN) 25 MG TABLET    Take 1 tablet (25 mg total) by mouth every 6 (six) hours as needed for nausea.   SUCRALFATE (CARAFATE) 1 G TABLET    Take 1 tablet (1 g total) by mouth 4 (four) times daily.    Plan discharge   Devoria Albe, MD, FACEP    MDM    Ward Givens, MD 09/18/12 1556

## 2012-09-19 ENCOUNTER — Telehealth (INDEPENDENT_AMBULATORY_CARE_PROVIDER_SITE_OTHER): Payer: Self-pay

## 2012-09-19 NOTE — Telephone Encounter (Signed)
Pt called Dr. Gerrit Friends this weekend c/o left upper quadrant abd pain.  Was seen by ER MD at [facility] on Sunday.  Labs, exam, and CT scan all normal.  Has hx of ulcers in pouch.  Taking PPI but does not buy carafate - too expensive.  Bypass last Sept.  May need EGD.  Msg sent to Dr. Andrey Campanile ([facility]).  Private patient of Dr. Johna Sheriff.  If needing to be seen emergently she can see Dr. Ezzard Standing.  She needs to be taking her PPI as well as Carafate (he doesn't think it is that expensive).

## 2012-09-20 ENCOUNTER — Other Ambulatory Visit: Payer: Self-pay | Admitting: Dermatology

## 2012-09-20 LAB — URINE CULTURE

## 2012-10-03 ENCOUNTER — Emergency Department (HOSPITAL_COMMUNITY): Payer: BC Managed Care – PPO

## 2012-10-03 ENCOUNTER — Encounter (HOSPITAL_COMMUNITY): Payer: Self-pay | Admitting: Internal Medicine

## 2012-10-03 ENCOUNTER — Inpatient Hospital Stay (HOSPITAL_COMMUNITY)
Admission: EM | Admit: 2012-10-03 | Discharge: 2012-10-05 | DRG: 016 | Disposition: A | Discharge status: Home / Self Care | Payer: BC Managed Care – PPO | Attending: Internal Medicine | Admitting: Internal Medicine

## 2012-10-03 DIAGNOSIS — G8929 Other chronic pain: Secondary | ICD-10-CM | POA: Diagnosis present

## 2012-10-03 DIAGNOSIS — K219 Gastro-esophageal reflux disease without esophagitis: Secondary | ICD-10-CM | POA: Diagnosis present

## 2012-10-03 DIAGNOSIS — E876 Hypokalemia: Secondary | ICD-10-CM | POA: Diagnosis present

## 2012-10-03 DIAGNOSIS — Z8669 Personal history of other diseases of the nervous system and sense organs: Secondary | ICD-10-CM

## 2012-10-03 DIAGNOSIS — Z09 Encounter for follow-up examination after completed treatment for conditions other than malignant neoplasm: Secondary | ICD-10-CM

## 2012-10-03 DIAGNOSIS — G9340 Encephalopathy, unspecified: Secondary | ICD-10-CM | POA: Diagnosis not present

## 2012-10-03 DIAGNOSIS — F3289 Other specified depressive episodes: Secondary | ICD-10-CM | POA: Diagnosis present

## 2012-10-03 DIAGNOSIS — G8194 Hemiplegia, unspecified affecting left nondominant side: Secondary | ICD-10-CM

## 2012-10-03 DIAGNOSIS — F329 Major depressive disorder, single episode, unspecified: Secondary | ICD-10-CM | POA: Diagnosis present

## 2012-10-03 DIAGNOSIS — Z880 Allergy status to penicillin: Secondary | ICD-10-CM

## 2012-10-03 DIAGNOSIS — F411 Generalized anxiety disorder: Secondary | ICD-10-CM | POA: Diagnosis present

## 2012-10-03 DIAGNOSIS — E1142 Type 2 diabetes mellitus with diabetic polyneuropathy: Secondary | ICD-10-CM | POA: Diagnosis present

## 2012-10-03 DIAGNOSIS — R197 Diarrhea, unspecified: Secondary | ICD-10-CM

## 2012-10-03 DIAGNOSIS — R4182 Altered mental status, unspecified: Secondary | ICD-10-CM

## 2012-10-03 DIAGNOSIS — Z9989 Dependence on other enabling machines and devices: Secondary | ICD-10-CM

## 2012-10-03 DIAGNOSIS — Z9884 Bariatric surgery status: Secondary | ICD-10-CM | POA: Diagnosis present

## 2012-10-03 DIAGNOSIS — F988 Other specified behavioral and emotional disorders with onset usually occurring in childhood and adolescence: Secondary | ICD-10-CM | POA: Diagnosis present

## 2012-10-03 DIAGNOSIS — F449 Dissociative and conversion disorder, unspecified: Secondary | ICD-10-CM

## 2012-10-03 DIAGNOSIS — G43409 Hemiplegic migraine, not intractable, without status migrainosus: Secondary | ICD-10-CM | POA: Diagnosis present

## 2012-10-03 DIAGNOSIS — E785 Hyperlipidemia, unspecified: Secondary | ICD-10-CM | POA: Diagnosis present

## 2012-10-03 DIAGNOSIS — E119 Type 2 diabetes mellitus without complications: Secondary | ICD-10-CM

## 2012-10-03 DIAGNOSIS — K5900 Constipation, unspecified: Secondary | ICD-10-CM | POA: Diagnosis present

## 2012-10-03 DIAGNOSIS — R1012 Left upper quadrant pain: Secondary | ICD-10-CM

## 2012-10-03 DIAGNOSIS — IMO0002 Reserved for concepts with insufficient information to code with codable children: Secondary | ICD-10-CM | POA: Diagnosis present

## 2012-10-03 DIAGNOSIS — Z791 Long term (current) use of non-steroidal anti-inflammatories (NSAID): Secondary | ICD-10-CM

## 2012-10-03 DIAGNOSIS — M549 Dorsalgia, unspecified: Secondary | ICD-10-CM | POA: Diagnosis present

## 2012-10-03 DIAGNOSIS — K912 Postsurgical malabsorption, not elsewhere classified: Secondary | ICD-10-CM

## 2012-10-03 DIAGNOSIS — K289 Gastrojejunal ulcer, unspecified as acute or chronic, without hemorrhage or perforation: Secondary | ICD-10-CM

## 2012-10-03 DIAGNOSIS — G4733 Obstructive sleep apnea (adult) (pediatric): Secondary | ICD-10-CM | POA: Diagnosis present

## 2012-10-03 DIAGNOSIS — Z79899 Other long term (current) drug therapy: Secondary | ICD-10-CM

## 2012-10-03 DIAGNOSIS — E1149 Type 2 diabetes mellitus with other diabetic neurological complication: Secondary | ICD-10-CM | POA: Diagnosis present

## 2012-10-03 DIAGNOSIS — E1165 Type 2 diabetes mellitus with hyperglycemia: Secondary | ICD-10-CM

## 2012-10-03 LAB — COMPREHENSIVE METABOLIC PANEL
Albumin: 3.5 g/dL (ref 3.5–5.2)
Alkaline Phosphatase: 66 U/L (ref 39–117)
BUN: 9 mg/dL (ref 6–23)
CO2: 29 mEq/L (ref 19–32)
Chloride: 106 mEq/L (ref 96–112)
Creatinine, Ser: 0.58 mg/dL (ref 0.50–1.10)
GFR calc Af Amer: >90 mL/min (ref >90)
GFR calc non Af Amer: >90 mL/min (ref >90)
Glucose, Bld: 104 mg/dL — ABNORMAL HIGH (ref 70–99)
Total Bilirubin: 0.1 mg/dL — ABNORMAL LOW (ref 0.3–1.2)

## 2012-10-03 LAB — URINALYSIS W MICROSCOPIC + REFLEX CULTURE
Hgb urine dipstick: NEGATIVE
Leukocytes, UA: NEGATIVE
Protein, ur: NEGATIVE mg/dL
Urine-Other: NONE SEEN
Urobilinogen, UA: 0.2 mg/dL (ref 0.0–1.0)

## 2012-10-03 LAB — CBC WITH DIFFERENTIAL/PLATELET
Basophils Relative: 0 % (ref 0–1)
HCT: 39.3 % (ref 36.0–46.0)
Hemoglobin: 12.5 g/dL (ref 12.0–15.0)
Lymphs Abs: 2.9 10*3/uL (ref 0.7–4.0)
MCHC: 31.8 g/dL (ref 30.0–36.0)
Monocytes Absolute: 0.5 10*3/uL (ref 0.1–1.0)
Monocytes Relative: 5 % (ref 3–12)
Neutro Abs: 6.3 10*3/uL (ref 1.7–7.7)
RBC: 4.84 MIL/uL (ref 3.87–5.11)

## 2012-10-03 LAB — ETHANOL: Alcohol, Ethyl (B): <11 mg/dL (ref 0–11)

## 2012-10-03 LAB — RAPID URINE DRUG SCREEN, HOSP PERFORMED
Amphetamines: NOT DETECTED
Tetrahydrocannabinol: NOT DETECTED

## 2012-10-03 LAB — AMMONIA: Ammonia: 24 umol/L (ref 11–60)

## 2012-10-03 MED ORDER — SODIUM CHLORIDE 0.9 % IV SOLN
INTRAVENOUS | Status: DC
  Administered 2012-10-03: 22:00:00 via INTRAVENOUS

## 2012-10-03 NOTE — ED Notes (Signed)
MD at bedside. Dr. Richrd Prime at bedside.

## 2012-10-03 NOTE — H&P (Signed)
[facility] History and Physical  [name] HYQ:[id number] DOB: 06/29/1975 DOA: 10/03/2012  Referring physician: ER physician. PCP: Reather Littler, MD   Chief Complaint: Altered mental status.  Most of the history was obtained from ER physician and previous records.  HPI: [name] is a 37 y.o. female with known history of anxiety and depression ADD and previous history of OSA and diabetes mellitus of CPAP and medications after gastric bypass was brought to the ER after patient's husband found patient to be increasingly lethargic since morning. By the time I examined the patient patient has become more arousable and able to provide minimal history. Patient states that she has not slept well last night after she had increasing pain in her lower back and is feeling very lethargic. Denies having overdosed with any of her medications. Patient denies any headache focal deficits nausea vomiting abdominal pain diarrhea chest pain or shortness of breath. Labs and CT head and EKG chest x-ray are unremarkable. Urine drug screen is positive for benzos.  Review of Systems: As presented in the history of presenting illness, rest negative.  Past Medical History  Diagnosis Date  . Depression   . Anxiety   . Diabetes mellitus   . GERD (gastroesophageal reflux disease)   . ADD (attention deficit disorder)   . Hyperlipemia   . Herniated disc   . Allergy     Seasonal  . Diabetic neuropathy   . Shortness of breath     with exertion   . Sleep apnea     obsructive settings at 13   . Heart murmur     asa child  . Seizures     hx of / month ago - dehydration   . 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 Degenerative disc disease   . Annular disc tear   . Retrolisthesis     L4 and L5  . Bulging disc    Past Surgical History  Procedure Laterality Date  . Cholecystectomy    . Cesarean section    . Tonsillectomy    . Laporoscopy   2007  . Breath tek h pylori  09/11/2011    Procedure: BREATH TEK H PYLORI;  Surgeon: Mariella Saa, MD;  Location: [facility];  Service: General;  Laterality: N/A;  . 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 Bowel resection  11/30/2011    Procedure: SMALL BOWEL RESECTION;  Surgeon: Mariella Saa, MD;  Location: [facility];  Service: General;;  . Esophagogastroduodenoscopy  03/14/2012    Procedure: ESOPHAGOGASTRODUODENOSCOPY (EGD);  Surgeon: Kandis Cocking, MD;  Location: [facility];  Service: General;  Laterality: N/A;  . Esophagogastroduodenoscopy  04/25/2012    Procedure: ESOPHAGOGASTRODUODENOSCOPY (EGD);  Surgeon: Kandis Cocking, MD;  Location: [facility];  Service: General;  Laterality: N/A;   Social History:  reports that she has never smoked. She has never used smokeless tobacco. She reports that she does not drink alcohol or use illicit drugs. Home. where does patient live-- Can do ADLs. Can patient participate in ADLs?  Allergies  Allergen Reactions  . Atarax (Hydroxyzine Hcl) Anaphylaxis  . Cephalosporins Anaphylaxis  . Codeine Anaphylaxis    Tolerates Dilaudid and Percocet.  . Compazine Anaphylaxis    Tolerates Phenergan.  . Decadron (Dexamethasone) Anaphylaxis  . Demerol  Anaphylaxis  . Metoclopramide Hcl Anaphylaxis  . Morphine And Related Anaphylaxis    Tolerates Dilaudid and Percocet.  Marland Kitchen Penicillins Anaphylaxis  . Sulfa Antibiotics Anaphylaxis  . Triptans Anaphylaxis  . Zofran Anaphylaxis  . Depo-Medrol (Methylprednisolone)     Pt is s/p RYGB; steroids increase risk of ulcers in these pts.  . Pork-Derived Products Other (See Comments)    Religous reasons--per pt ok to take Heparin when medically necessary  . Shellfish-Derived Products Other (See Comments)    Religous reasons    Family History  Problem Relation Age of Onset  .  Diabetes type II    . Diabetes Mother   . Hypertension Mother   . Depression Brother   . Cancer Maternal Aunt     ovarian   . Depression Maternal Aunt   . Diabetes Maternal Aunt   . Cancer Maternal Grandmother     Lung  . COPD Maternal Grandmother   . Cancer Maternal Grandfather     Prostate  . Diabetes Maternal Grandfather   . Hearing loss Maternal Grandfather   . Hypertension Maternal Grandfather   . Stroke Maternal Grandfather   . Cancer Paternal Grandmother     Colon      Prior to Admission medications   Medication Sig Start Date End Date Taking? Authorizing Provider  ALPRAZolam Prudy Feeler) 1 MG tablet Take 1-2 mg by mouth 4 (four) times daily as needed for anxiety. For anxiety    Rupinder Stevan Born, MD  calcium-vitamin D (OSCAL WITH D) 500-200 MG-UNIT per tablet Take 1 tablet by mouth daily.    Historical Provider, MD  Cholecalciferol (CVS VITAMIN D3 PO) Take by mouth daily.    Historical Provider, MD  cyclobenzaprine (FLEXERIL) 10 MG tablet Take 10 mg by mouth 3 (three) times daily as needed. For headaches    Historical Provider, MD  cyproheptadine (PERIACTIN) 2 MG/5ML syrup at bedtime. Take 90mls(8mg  total) at bedtime 08/18/12   Historical Provider, MD  doxepin (SINEQUAN) 50 MG capsule Take 50 mg by mouth 3 (three) times daily.    Historical Provider, MD  famotidine (PEPCID) 20 MG tablet Take 1 tablet (20 mg total) by mouth 2 (two) times daily. 04/07/12   Dione Booze, MD  ferrous sulfate (CVS IRON) 325 (65 FE) MG tablet Take 325 mg by mouth daily with breakfast.    Historical Provider, MD  HYDROmorphone (DILAUDID) 4 MG tablet Take 4 mg by mouth 3 (three) times daily as needed for pain.    Historical Provider, MD  ketoconazole (NIZORAL) 2 % shampoo Apply 1 application topically daily as needed. For scalp itching/pain. 08/11/11   Historical Provider, MD  Methylphenidate HCl ER (QUILLIVANT XR) 25 MG/5ML SUSR Take 12 mLs by mouth daily.    Historical Provider, MD  Multiple  Vitamins-Minerals (ADULT GUMMY) CHEW Chew 2 tablets by mouth daily.    Historical Provider, MD  OLANZapine (ZYPREXA) 2.5 MG tablet Take 2.5 mg by mouth at bedtime. Take up to TID for hemiplegic migraines    Historical Provider, MD  pantoprazole (PROTONIX) 40 MG tablet Take 40 mg by mouth 2 times daily at 12 noon and 4 pm. Takes at 12p and 4p 03/15/12   Mariella Saa, MD  pantoprazole (PROTONIX) 40 MG tablet Take 1 po BID x 2 weeks then once a day 09/18/12   Ward Givens, MD  promethazine (PHENERGAN) 25 MG tablet Take 1 tablet (25 mg total) by mouth every 6 (six) hours as needed for nausea. 09/18/12   Iva  Hildred Laser, MD  promethazine (PHENERGAN) 25 MG/ML injection Inject 25 mg into the vein 4 (four) times daily as needed.    Historical Provider, MD  sucralfate (CARAFATE) 1 G tablet Take 1 tablet (1 g total) by mouth 4 (four) times daily. 09/18/12   Ward Givens, MD  UNABLE TO FIND Take 500 mcg by mouth daily. Med Name: Biotin 500 mcg    Orvil Feil Himmelrich, RD  vitamin B-12 (CYANOCOBALAMIN) 1000 MCG tablet Take 2,500 mcg by mouth at bedtime.    Historical Provider, MD   Physical Exam: Filed Vitals:   10/03/12 1921 10/03/12 2115 10/03/12 2116 10/03/12 2223  BP: 109/78 110/72 110/72 109/72  Pulse: 72  71 72  Temp: 98.3 F (36.8 C)     TempSrc: Oral     Resp:  22 20 20   SpO2: 96%  97% 98%     General:  Well-developed well-nourished.  Eyes: Anicteric no pallor.  ENT: No discharge from ears eyes nose mouth.  Neck: No mass felt.  Cardiovascular: S1-S2 heard.  Respiratory: No rhonchi or crepitations.  Abdomen: Soft nontender bowel sounds present.  Skin: No rash.  Musculoskeletal: No edema.  Psychiatric: Denies any suicidal or homicidal thoughts.  Neurologic: Patient is lethargic but arousable and follows commands. Is oriented to time and place. Moves all extremities.  Labs on Admission:  Basic Metabolic Panel:  Recent Labs Lab 10/03/12 2035  NA 143  K 3.8  CL 106  CO2  29  GLUCOSE 104*  BUN 9  CREATININE 0.58  CALCIUM 9.0   Liver Function Tests:  Recent Labs Lab 10/03/12 2035  AST 13  ALT 12  ALKPHOS 66  BILITOT 0.1*  PROT 7.1  ALBUMIN 3.5   No results found for this basename: LIPASE, AMYLASE,  in the last 168 hours  Recent Labs Lab 10/03/12 2035  AMMONIA 24   CBC:  Recent Labs Lab 10/03/12 2035  WBC 9.8  NEUTROABS 6.3  HGB 12.5  HCT 39.3  MCV 81.2  PLT 246   Cardiac Enzymes: No results found for this basename: CKTOTAL, CKMB, CKMBINDEX, TROPONINI,  in the last 168 hours  BNP (last 3 results) No results found for this basename: PROBNP,  in the last 8760 hours CBG:  Recent Labs Lab 10/03/12 1943  GLUCAP 124*    Radiological Exams on Admission: Dg Chest 2 View  10/03/2012   *RADIOLOGY REPORT*  Clinical Data: Altered mental status  CHEST - 2 VIEW  Comparison: 09/18/2012 and earlier studies  Findings: Relatively low lung volumes with resultant crowding of bronchovascular structures.  No focal infiltrate or overt edema. No effusion.  Heart size normal.  Regional bones unremarkable.  IMPRESSION:  Low volumes.  No acute disease.   Original Report Authenticated By: D. Andria Rhein, MD   Ct Head Wo Contrast  10/03/2012   *RADIOLOGY REPORT*  Clinical Data: Altered mental status.  CT HEAD WITHOUT CONTRAST  Technique:  Contiguous axial images were obtained from the base of the skull through the vertex without contrast.  Comparison: 08/02/2012.  Findings:  Calvarium: No acute osseous abnormality.  No lytic or blastic lesion.  Orbits: No acute abnormality.  Brain: No evidence of acute abnormality, including acute infarction, hemorrhage, hydrocephalus, or mass lesion/mass effect.  IMPRESSION: Negative head CT.   Original Report Authenticated By: Tiburcio Pea    EKG: Independently reviewed. Normal sinus rhythm.  Assessment/Plan Active Problems:   Acute encephalopathy   1. Acute encephalopathy versus conversion disorder - closely  observe  in telemetry overnight. If patient is still lethargic in the morning then we may have to hold her home medications as patient takes pain in the medications and antipsychotics. Check ABG for carbon dioxide retention. 2. History of ADD and anxiety and depression - see #1. Patient denies any suicidal or homicidal thoughts. 3. History of diabetes mellitus off medications after gastric bypass. 4. History of CPAP - off CPAP recently.    Code Status: Full code.  Family Communication: None.  Disposition Plan: Admit to inpatient.    Akiem Urieta N. [facility] Pager [phone].  If 7PM-7AM, please contact night-coverage [email or web address] Password [facility] 10/03/2012, 11:07 PM

## 2012-10-03 NOTE — ED Notes (Signed)
Pt responds appropriately to questions. Pt making jerking mvmts in bed. Pt states she has burning to her arms. Pt states she needs to urinate. Pt offered bed pan, but refused.

## 2012-10-03 NOTE — ED Notes (Signed)
Pt BIB EMS. Pt's husband states she has had altered mental status which started today. Pt has been confused with slurred speech which has been getting worse today. Pt denies pain per EMS. Pt was given Narcan 0.5mg  IVP per EMS at 1859. No improvement per EMS. Pt alert to self and situation per EMS.

## 2012-10-03 NOTE — ED Notes (Signed)
MD at bedside. Dr. Clarene Duke at bedside speaking with pt's husband.

## 2012-10-03 NOTE — ED Provider Notes (Signed)
History    CSN: [id number] Arrival date & time 10/03/12  1914  First MD Initiated Contact with Patient 10/03/12 1936     Chief Complaint  Patient presents with  . Altered Mental Status    HPI Pt was seen at 1945.  Per EMS, pt's husband and pt report, c/o gradual onset and persistence of lethargy and AMS that began this morning. Pt's husband states her symptoms began when she woke up this morning. Has been associated with slurred speech. Pt's husband concerned regarding "all the medicines she takes" and "if they are interacting with each other."  Pt herself denies overdose or taking her meds to excess. States she "just is tired."  Denies fevers, no CP/SOB, no cough, no abd pain, no N/V/D, no focal motor weakness, no tingling/numbness in extremities.    Past Medical History  Diagnosis Date  . Depression   . Anxiety   . Diabetes mellitus   . GERD (gastroesophageal reflux disease)   . ADD (attention deficit disorder)   . Hyperlipemia   . Herniated disc   . Allergy     Seasonal  . Diabetic neuropathy   . Shortness of breath     with exertion   . Sleep apnea     obsructive settings at 13   . Heart murmur     asa child  . Seizures     hx of / month ago - dehydration   . 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 Degenerative disc disease   . Annular disc tear   . Retrolisthesis     L4 and L5  . Bulging disc    Past Surgical History  Procedure Laterality Date  . Cholecystectomy    . Cesarean section    . Tonsillectomy    . Laporoscopy  2007  . Breath tek h pylori  09/11/2011    Procedure: BREATH TEK H PYLORI;  Surgeon: Mariella Saa, MD;  Location: [facility];  Service: General;  Laterality: N/A;  . 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 Bowel resection  11/30/2011    Procedure: SMALL  BOWEL RESECTION;  Surgeon: Mariella Saa, MD;  Location: [facility];  Service: General;;  . Esophagogastroduodenoscopy  03/14/2012    Procedure: ESOPHAGOGASTRODUODENOSCOPY (EGD);  Surgeon: Kandis Cocking, MD;  Location: [facility];  Service: General;  Laterality: N/A;  . Esophagogastroduodenoscopy  04/25/2012    Procedure: ESOPHAGOGASTRODUODENOSCOPY (EGD);  Surgeon: Kandis Cocking, MD;  Location: [facility];  Service: General;  Laterality: N/A;   Family History  Problem Relation Age of Onset  . Diabetes type II    . Diabetes Mother   . Hypertension Mother   . Depression Brother   . Cancer Maternal Aunt     ovarian   . Depression Maternal Aunt   . Diabetes Maternal Aunt   . Cancer Maternal Grandmother     Lung  . COPD Maternal Grandmother   . Cancer Maternal Grandfather     Prostate  . Diabetes Maternal Grandfather   . Hearing loss Maternal Grandfather   . Hypertension Maternal Grandfather   . Stroke Maternal Grandfather   . Cancer Paternal Grandmother     Colon   History  Substance Use Topics  . Smoking status: Never Smoker   . Smokeless  tobacco: Never Used  . Alcohol Use: No    Review of Systems ROS: Statement: All systems negative except as marked or noted in the HPI; Constitutional: Negative for fever and chills. ; ; Eyes: Negative for eye pain, redness and discharge. ; ; ENMT: Negative for ear pain, hoarseness, nasal congestion, sinus pressure and sore throat. ; ; Cardiovascular: Negative for chest pain, palpitations, diaphoresis, dyspnea and peripheral edema. ; ; Respiratory: Negative for cough, wheezing and stridor. ; ; Gastrointestinal: Negative for nausea, vomiting, diarrhea, abdominal pain, blood in stool, hematemesis, jaundice and rectal bleeding. . ; ; Genitourinary: Negative for dysuria, flank pain and hematuria. ; ; Musculoskeletal: Negative for back pain and neck pain. Negative for swelling and trauma.; ; Skin: Negative for pruritus, rash, abrasions, blisters,  bruising and skin lesion.; ; Neuro: +lethargy, AMS. Negative for headache, lightheadedness and neck stiffness. Negative for weakness, extremity weakness, paresthesias, involuntary movement, seizure and syncope.      Allergies  Atarax; Cephalosporins; Codeine; Compazine; Decadron; Demerol; Metoclopramide hcl; Morphine and related; Penicillins; Sulfa antibiotics; Triptans; Zofran; Depo-medrol; Pork-derived products; and Shellfish-derived products  Home Medications   Current Outpatient Rx  Name  Route  Sig  Dispense  Refill  . ALPRAZolam (XANAX) 1 MG tablet   Oral   Take 1-2 mg by mouth 4 (four) times daily as needed for anxiety. For anxiety         . calcium-vitamin D (OSCAL WITH D) 500-200 MG-UNIT per tablet   Oral   Take 1 tablet by mouth daily.         . Cholecalciferol (CVS VITAMIN D3 PO)   Oral   Take by mouth daily.         . cyclobenzaprine (FLEXERIL) 10 MG tablet   Oral   Take 10 mg by mouth 3 (three) times daily as needed. For headaches         . cyproheptadine (PERIACTIN) 2 MG/5ML syrup      at bedtime. Take 63mls(8mg  total) at bedtime         . doxepin (SINEQUAN) 50 MG capsule   Oral   Take 50 mg by mouth 3 (three) times daily.         . famotidine (PEPCID) 20 MG tablet   Oral   Take 1 tablet (20 mg total) by mouth 2 (two) times daily.   20 tablet   0   . ferrous sulfate (CVS IRON) 325 (65 FE) MG tablet   Oral   Take 325 mg by mouth daily with breakfast.         . HYDROmorphone (DILAUDID) 4 MG tablet   Oral   Take 4 mg by mouth 3 (three) times daily as needed for pain.         Marland Kitchen ketoconazole (NIZORAL) 2 % shampoo   Topical   Apply 1 application topically daily as needed. For scalp itching/pain.         . Methylphenidate HCl ER (QUILLIVANT XR) 25 MG/5ML SUSR   Oral   Take 12 mLs by mouth daily.         . Multiple Vitamins-Minerals (ADULT GUMMY) CHEW   Oral   Chew 2 tablets by mouth daily.         Marland Kitchen OLANZapine (ZYPREXA) 2.5 MG  tablet   Oral   Take 2.5 mg by mouth at bedtime. Take up to TID for hemiplegic migraines         . pantoprazole (PROTONIX) 40 MG tablet   Oral  Take 40 mg by mouth 2 times daily at 12 noon and 4 pm. Takes at 12p and 4p         . pantoprazole (PROTONIX) 40 MG tablet      Take 1 po BID x 2 weeks then once a day   60 tablet   0   . promethazine (PHENERGAN) 25 MG tablet   Oral   Take 1 tablet (25 mg total) by mouth every 6 (six) hours as needed for nausea.   15 tablet   0   . promethazine (PHENERGAN) 25 MG/ML injection   Intravenous   Inject 25 mg into the vein 4 (four) times daily as needed.         . sucralfate (CARAFATE) 1 G tablet   Oral   Take 1 tablet (1 g total) by mouth 4 (four) times daily.   120 tablet   0   . UNABLE TO FIND   Oral   Take 500 mcg by mouth daily. Med Name: Biotin 500 mcg         . vitamin B-12 (CYANOCOBALAMIN) 1000 MCG tablet   Oral   Take 2,500 mcg by mouth at bedtime.          BP 110/72  Pulse 71  Temp(Src) 98.3 F (36.8 C) (Oral)  Resp 20  SpO2 97% Physical Exam 1950: Physical examination:  Nursing notes reviewed; Vital signs and O2 SAT reviewed;  Constitutional: Well developed, Well nourished, In no acute distress; Head:  Normocephalic, atraumatic; Eyes: EOMI, PERRL, No scleral icterus; ENMT: Mouth and pharynx normal, Mucous membranes dry;; Neck: Supple, Full range of motion, No lymphadenopathy; Cardiovascular: Regular rate and rhythm, No gallop; Respiratory: Breath sounds clear & equal bilaterally, No rales, rhonchi, wheezes.  Speaking full sentences with ease, Normal respiratory effort/excursion; Chest: Nontender, Movement normal; Abdomen: Soft, Nontender, Nondistended, Normal bowel sounds; Genitourinary: No CVA tenderness; Extremities: Pulses normal, No tenderness, No edema, No calf edema or asymmetry.; Neuro: Lethargic, but awakens to her name, then goes back to sleep. Speech slurred. No facial droop. Grips equal. Strength 5/5  equal bilat UE's and LE's. Moves all ext spontaneously and to command without apparent gross focal motor deficits.; Skin: Color normal, Warm, Dry.   ED Course  Procedures     MDM  MDM Reviewed: previous chart, nursing note and vitals Reviewed previous: ECG Interpretation: labs, ECG, x-ray and CT scan    Date: 10/03/2012  Rate: 73  Rhythm: normal sinus rhythm  QRS Axis: normal  Intervals: normal  ST/T Wave abnormalities: normal  Conduction Disutrbances:none  Narrative Interpretation:   Old EKG Reviewed: unchanged; no significant changes from previous EKG dated 08/02/2012.  Results for orders placed during the hospital encounter of 10/03/12  GLUCOSE, CAPILLARY      Result Value Range   Glucose-Capillary 124 (*) 70 - 99 mg/dL  URINE RAPID DRUG SCREEN (HOSP PERFORMED)      Result Value Range   Opiates NONE DETECTED  NONE DETECTED   Cocaine NONE DETECTED  NONE DETECTED   Benzodiazepines POSITIVE (*) NONE DETECTED   Amphetamines NONE DETECTED  NONE DETECTED   Tetrahydrocannabinol NONE DETECTED  NONE DETECTED   Barbiturates NONE DETECTED  NONE DETECTED  URINALYSIS W MICROSCOPIC + REFLEX CULTURE      Result Value Range   Color, Urine YELLOW  YELLOW   APPearance CLEAR  CLEAR   Specific Gravity, Urine 1.015  1.005 - 1.030   pH 6.5  5.0 - 8.0   Glucose, UA NEGATIVE  NEGATIVE mg/dL   Hgb urine dipstick NEGATIVE  NEGATIVE   Bilirubin Urine NEGATIVE  NEGATIVE   Ketones, ur NEGATIVE  NEGATIVE mg/dL   Protein, ur NEGATIVE  NEGATIVE mg/dL   Urobilinogen, UA 0.2  0.0 - 1.0 mg/dL   Nitrite NEGATIVE  NEGATIVE   Leukocytes, UA NEGATIVE  NEGATIVE   Urine-Other       Value: NO FORMED ELEMENTS SEEN ON URINE MICROSCOPIC EXAMINATION  PREGNANCY, URINE      Result Value Range   Preg Test, Ur NEGATIVE  NEGATIVE  ACETAMINOPHEN LEVEL      Result Value Range   Acetaminophen (Tylenol), Serum <15.0  10 - 30 ug/mL  SALICYLATE LEVEL      Result Value Range   Salicylate Lvl <2.0 (*) 2.8 - 20.0  mg/dL  ETHANOL      Result Value Range   Alcohol, Ethyl (B) <11  0 - 11 mg/dL  CBC WITH DIFFERENTIAL      Result Value Range   WBC 9.8  4.0 - 10.5 K/uL   RBC 4.84  3.87 - 5.11 MIL/uL   Hemoglobin 12.5  12.0 - 15.0 g/dL   HCT 40.9  81.1 - 91.4 %   MCV 81.2  78.0 - 100.0 fL   MCH 25.8 (*) 26.0 - 34.0 pg   MCHC 31.8  30.0 - 36.0 g/dL   RDW 78.2  95.6 - 21.3 %   Platelets 246  150 - 400 K/uL   Neutrophils Relative % 64  43 - 77 %   Neutro Abs 6.3  1.7 - 7.7 K/uL   Lymphocytes Relative 30  12 - 46 %   Lymphs Abs 2.9  0.7 - 4.0 K/uL   Monocytes Relative 5  3 - 12 %   Monocytes Absolute 0.5  0.1 - 1.0 K/uL   Eosinophils Relative 1  0 - 5 %   Eosinophils Absolute 0.1  0.0 - 0.7 K/uL   Basophils Relative 0  0 - 1 %   Basophils Absolute 0.0  0.0 - 0.1 K/uL  COMPREHENSIVE METABOLIC PANEL      Result Value Range   Sodium 143  135 - 145 mEq/L   Potassium 3.8  3.5 - 5.1 mEq/L   Chloride 106  96 - 112 mEq/L   CO2 29  19 - 32 mEq/L   Glucose, Bld 104 (*) 70 - 99 mg/dL   BUN 9  6 - 23 mg/dL   Creatinine, Ser 0.86  0.50 - 1.10 mg/dL   Calcium 9.0  8.4 - 57.8 mg/dL   Total Protein 7.1  6.0 - 8.3 g/dL   Albumin 3.5  3.5 - 5.2 g/dL   AST 13  0 - 37 U/L   ALT 12  0 - 35 U/L   Alkaline Phosphatase 66  39 - 117 U/L   Total Bilirubin 0.1 (*) 0.3 - 1.2 mg/dL   GFR calc non Af Amer >90  >90 mL/min   GFR calc Af Amer >90  >90 mL/min  AMMONIA      Result Value Range   Ammonia 24  11 - 60 umol/L   Dg Chest 2 View 10/03/2012   *RADIOLOGY REPORT*  Clinical Data: Altered mental status  CHEST - 2 VIEW  Comparison: 09/18/2012 and earlier studies  Findings: Relatively low lung volumes with resultant crowding of bronchovascular structures.  No focal infiltrate or overt edema. No effusion.  Heart size normal.  Regional bones unremarkable.  IMPRESSION:  Low volumes.  No acute  disease.   Original Report Authenticated By: D. Andria Rhein, MD   Ct Head Wo Contrast 10/03/2012   *RADIOLOGY REPORT*  Clinical  Data: Altered mental status.  CT HEAD WITHOUT CONTRAST  Technique:  Contiguous axial images were obtained from the base of the skull through the vertex without contrast.  Comparison: 08/02/2012.  Findings:  Calvarium: No acute osseous abnormality.  No lytic or blastic lesion.  Orbits: No acute abnormality.  Brain: No evidence of acute abnormality, including acute infarction, hemorrhage, hydrocephalus, or mass lesion/mass effect.  IMPRESSION: Negative head CT.   Original Report Authenticated By: Tiburcio Pea     2220:  Pt continues to have stable VS, remains afebrile, resps easy, neuro exam unchanged. Continues lethargic and states she "is tired," refuses to speak further with ED staff, and goes back to sleep. Pt's husband states he does not believe she overdosed on her meds "just that she takes too many" at baseline.  Dx and testing d/w pt's family.  Questions answered.  Verb understanding, agreeable to observation admit.  T/C to [facility] Dr. Toniann Fail, case discussed, including:  HPI, pertinent PM/SHx, VS/PE, dx testing, ED course and treatment:  Agreeable to observation admit, requests to write temporary orders, obtain tele bed to team 8.      Laray Anger, DO 10/05/12 1529

## 2012-10-03 NOTE — ED Notes (Signed)
CBG 124mg /dl

## 2012-10-04 DIAGNOSIS — R4182 Altered mental status, unspecified: Secondary | ICD-10-CM | POA: Diagnosis not present

## 2012-10-04 DIAGNOSIS — IMO0001 Reserved for inherently not codable concepts without codable children: Secondary | ICD-10-CM | POA: Diagnosis not present

## 2012-10-04 DIAGNOSIS — G9340 Encephalopathy, unspecified: Secondary | ICD-10-CM | POA: Diagnosis not present

## 2012-10-04 LAB — CBC WITH DIFFERENTIAL/PLATELET
Basophils Relative: 0 % (ref 0–1)
Eosinophils Absolute: 0.3 10*3/uL (ref 0.0–0.7)
Eosinophils Relative: 3 % (ref 0–5)
Hemoglobin: 11.8 g/dL — ABNORMAL LOW (ref 12.0–15.0)
Lymphs Abs: 3.7 10*3/uL (ref 0.7–4.0)
MCH: 26.1 pg (ref 26.0–34.0)
MCHC: 31.7 g/dL (ref 30.0–36.0)
MCV: 82.3 fL (ref 78.0–100.0)
Monocytes Relative: 7 % (ref 3–12)
Platelets: 248 10*3/uL (ref 150–400)
RBC: 4.52 MIL/uL (ref 3.87–5.11)

## 2012-10-04 LAB — COMPREHENSIVE METABOLIC PANEL
ALT: 12 U/L (ref 0–35)
AST: 13 U/L (ref 0–37)
Alkaline Phosphatase: 57 U/L (ref 39–117)
CO2: 27 mEq/L (ref 19–32)
Calcium: 8.5 mg/dL (ref 8.4–10.5)
GFR calc non Af Amer: >90 mL/min (ref >90)
Glucose, Bld: 104 mg/dL — ABNORMAL HIGH (ref 70–99)
Potassium: 3.2 mEq/L — ABNORMAL LOW (ref 3.5–5.1)
Sodium: 140 mEq/L (ref 135–145)

## 2012-10-04 LAB — BLOOD GAS, ARTERIAL
Acid-Base Excess: 0.5 mmol/L (ref 0.0–2.0)
Bicarbonate: 25.5 mEq/L — ABNORMAL HIGH (ref 20.0–24.0)
O2 Saturation: 95.8 %
pO2, Arterial: 87 mmHg (ref 80.0–100.0)

## 2012-10-04 MED ORDER — ACETAMINOPHEN 650 MG RE SUPP: 650.0000 mg | Freq: Four times a day (QID) | RECTAL | Status: DC | PRN

## 2012-10-04 MED ORDER — SUCRALFATE 1 G PO TABS
1.0000 g | ORAL_TABLET | Freq: Four times a day (QID) | ORAL | Status: DC
  Administered 2012-10-04 – 2012-10-05 (×7): 1 g via ORAL
  Filled 2012-10-04 (×8): qty 1

## 2012-10-04 MED ORDER — DOXEPIN HCL 50 MG PO CAPS
50.0000 mg | ORAL_CAPSULE | Freq: Three times a day (TID) | ORAL | Status: DC
  Administered 2012-10-04 – 2012-10-05 (×5): 50 mg via ORAL
  Filled 2012-10-04 (×6): qty 1

## 2012-10-04 MED ORDER — ALPRAZOLAM 1 MG PO TABS: 1.0000 mg | ORAL_TABLET | Freq: Four times a day (QID) | ORAL | Status: DC | PRN

## 2012-10-04 MED ORDER — CYPROHEPTADINE HCL 4 MG PO TABS
8.0000 mg | ORAL_TABLET | Freq: Every day | ORAL | Status: DC
  Administered 2012-10-04: 8 mg via ORAL
  Filled 2012-10-04 (×2): qty 2

## 2012-10-04 MED ORDER — CYCLOBENZAPRINE HCL 10 MG PO TABS
10.0000 mg | ORAL_TABLET | Freq: Three times a day (TID) | ORAL | Status: DC | PRN
  Filled 2012-10-04: qty 1

## 2012-10-04 MED ORDER — SODIUM CHLORIDE 0.9 % IV SOLN
INTRAVENOUS | Status: AC
  Administered 2012-10-04: 75 mL/h via INTRAVENOUS

## 2012-10-04 MED ORDER — CALCIUM CARBONATE-VITAMIN D 500-200 MG-UNIT PO TABS
1.0000 | ORAL_TABLET | Freq: Every day | ORAL | Status: DC
  Administered 2012-10-04 – 2012-10-05 (×2): 1 via ORAL
  Filled 2012-10-04 (×2): qty 1

## 2012-10-04 MED ORDER — PROMETHAZINE HCL 25 MG/ML IJ SOLN
25.0000 mg | Freq: Four times a day (QID) | INTRAMUSCULAR | Status: DC | PRN
Start: 2012-10-04 — End: 2012-10-04

## 2012-10-04 MED ORDER — VITAMIN B-12 1000 MCG PO TABS
2500.0000 ug | ORAL_TABLET | Freq: Every day | ORAL | Status: DC
  Administered 2012-10-04 (×2): 2500 ug via ORAL
  Filled 2012-10-04 (×3): qty 1

## 2012-10-04 MED ORDER — CYPROHEPTADINE HCL 4 MG PO TABS
4.0000 mg | ORAL_TABLET | Freq: Every day | ORAL | Status: DC
  Filled 2012-10-04: qty 1

## 2012-10-04 MED ORDER — ENOXAPARIN SODIUM 40 MG/0.4ML ~~LOC~~ SOLN
40.0000 mg | SUBCUTANEOUS | Status: DC
  Administered 2012-10-04 – 2012-10-05 (×2): 40 mg via SUBCUTANEOUS
  Filled 2012-10-04 (×2): qty 0.4

## 2012-10-04 MED ORDER — FAMOTIDINE 20 MG PO TABS
20.0000 mg | ORAL_TABLET | Freq: Two times a day (BID) | ORAL | Status: DC
  Administered 2012-10-04 – 2012-10-05 (×4): 20 mg via ORAL
  Filled 2012-10-04 (×5): qty 1

## 2012-10-04 MED ORDER — PANTOPRAZOLE SODIUM 40 MG PO TBEC
40.0000 mg | DELAYED_RELEASE_TABLET | Freq: Two times a day (BID) | ORAL | Status: DC
  Administered 2012-10-04 – 2012-10-05 (×4): 40 mg via ORAL
  Filled 2012-10-04 (×5): qty 1

## 2012-10-04 MED ORDER — FERROUS SULFATE 325 (65 FE) MG PO TABS
325.0000 mg | ORAL_TABLET | Freq: Every day | ORAL | Status: DC
  Administered 2012-10-04 – 2012-10-05 (×2): 325 mg via ORAL
  Filled 2012-10-04 (×3): qty 1

## 2012-10-04 MED ORDER — HYDROMORPHONE HCL 4 MG PO TABS
4.0000 mg | ORAL_TABLET | Freq: Three times a day (TID) | ORAL | Status: DC | PRN
  Administered 2012-10-04 (×2): 4 mg via ORAL
  Filled 2012-10-04 (×2): qty 1

## 2012-10-04 MED ORDER — SODIUM CHLORIDE 0.9 % IJ SOLN: 3.0000 mL | Freq: Two times a day (BID) | INTRAMUSCULAR | Status: DC

## 2012-10-04 MED ORDER — ALPRAZOLAM 0.25 MG PO TABS: 0.2500 mg | ORAL_TABLET | Freq: Four times a day (QID) | ORAL | Status: DC | PRN

## 2012-10-04 MED ORDER — POTASSIUM CHLORIDE CRYS ER 20 MEQ PO TBCR
40.0000 meq | EXTENDED_RELEASE_TABLET | Freq: Two times a day (BID) | ORAL | Status: AC
  Administered 2012-10-04 (×2): 40 meq via ORAL
  Filled 2012-10-04 (×2): qty 2

## 2012-10-04 MED ORDER — OLANZAPINE 2.5 MG PO TABS
2.5000 mg | ORAL_TABLET | Freq: Two times a day (BID) | ORAL | Status: DC | PRN
  Administered 2012-10-04: 2.5 mg via ORAL
  Filled 2012-10-04: qty 1

## 2012-10-04 MED ORDER — OLANZAPINE 2.5 MG PO TABS
2.5000 mg | ORAL_TABLET | Freq: Every day | ORAL | Status: DC
  Administered 2012-10-04: 2.5 mg via ORAL
  Filled 2012-10-04 (×3): qty 1

## 2012-10-04 MED ORDER — HYDROMORPHONE HCL 2 MG PO TABS
2.0000 mg | ORAL_TABLET | Freq: Three times a day (TID) | ORAL | Status: DC | PRN
  Administered 2012-10-05: 2 mg via ORAL
  Filled 2012-10-04: qty 1

## 2012-10-04 MED ORDER — METHYLPHENIDATE HCL ER 25 MG/5ML PO SUSR: 12.0000 mL | Freq: Every day | ORAL | Status: DC

## 2012-10-04 MED ORDER — ACETAMINOPHEN 325 MG PO TABS
650.0000 mg | ORAL_TABLET | Freq: Four times a day (QID) | ORAL | Status: DC | PRN
  Administered 2012-10-04 – 2012-10-05 (×3): 650 mg via ORAL
  Filled 2012-10-04 (×3): qty 2

## 2012-10-04 MED ORDER — METHYLPHENIDATE HCL ER 25 MG/5ML PO SUSR: 60.0000 mg | Freq: Every day | ORAL | Status: DC

## 2012-10-04 MED ORDER — SODIUM CHLORIDE 0.9 % IV SOLN
INTRAVENOUS | Status: AC
  Administered 2012-10-04: 03:00:00 via INTRAVENOUS

## 2012-10-04 MED ORDER — PROMETHAZINE HCL 25 MG PO TABS
25.0000 mg | ORAL_TABLET | Freq: Four times a day (QID) | ORAL | Status: DC | PRN
  Administered 2012-10-04: 25 mg via ORAL
  Filled 2012-10-04: qty 1

## 2012-10-04 NOTE — Progress Notes (Signed)
Pt now awake, seems to go from very groggy to awake. She is alert and oriented x4. States she has been super stressed out lately. She states she has had chronic back pain x2 months as she has "fell" out of bed x3. She states she doesn't know why she is falling out of bed. She states she is taking Xanaz 1-2 mg, 3-4 times a day. States she thinks she is doing well, she had gastric bypass in Sept 14, has lost 90lbs. Reassured, and spoke about finding alternative to medications for stress.

## 2012-10-04 NOTE — ED Notes (Signed)
Attempted to call report. Floor nurse to call back.

## 2012-10-04 NOTE — Progress Notes (Signed)
[facility] PROGRESS NOTE Interim History: 36 y.o. female with known history of anxiety and depression ADD and previous history of OSA and diabetes mellitus of CPAP and medications after gastric bypass was brought to the ER after patient's husband found patient to be increasingly lethargic since morning. By the time I examined the patient patient has become more arousable and able to provide minimal history. Patient states that she has not slept well last night after she had increasing pain in her lower back and is feeling very lethargic. Denies having overdosed with any of her medications    Assessment/Plan:  Acute encephalopathy: - She has remained afebrile with no leukocytosis, PCO2 on ABG was 45 electrolytes are within normal limits except for a mild decrease in her potassium of 3.2, she has no signs of liver failure and renal failure her platelets are within normal limits, UA does not show any signs of infection pregnancy test is negative, her on-call levels less than 11 his UDS is positive for benzos, her CT scan of the head shows no acute intracranial abnormalities chest x-ray showed no signs of pneumonia. - Most obvious causes overmedication for her psychotropic medications. Special for benzos and her opiates. - Xanax and hydromorphone starting tomorrow morning at a lower dose. She also had some changes to her medication, recently she was started on Zanaflex and Periactin. I will decrease the dose of these. - NPO.  Diabetes type 2, uncontrolled - cont CBG's.   Hemiplegic migraine - none    Code Status: full Family Communication: noen  Disposition Plan: inpatient   Consultants:  none  Procedures:  CT head  CXR  Antibiotics:  None  HPI/Subjective: Slurred speech hard a awake. Slow thought process.  Objective: Filed Vitals:   10/03/12 2116 10/03/12 2223 10/04/12 0130 10/04/12 0539  BP: 110/72 109/72 126/71 108/65  Pulse: 71 72 70 71  Temp:   98 F (36.7 C)  98 F (36.7 C)  TempSrc:   Oral Oral  Resp: 20 20 20 22   Height:   5\' 7"  (1.702 m)   Weight:   92.897 kg (204 lb 12.8 oz)   SpO2: 97% 98% 100% 98%   No intake or output data in the 24 hours ending 10/04/12 0754 Filed Weights   10/04/12 0130  Weight: 92.897 kg (204 lb 12.8 oz)    Exam:  General:  in no acute distress. lethargic HEENT: No bruits, no goiter.  Heart: Regular rate and rhythm, without murmurs, rubs, gallops.  Lungs: Good air movement, bilateral air movement.  Abdomen: Soft, nontender, nondistended, positive bowel sounds.  Neuro: Grossly intact, nonfocal.   Data Reviewed: Basic Metabolic Panel:  Recent Labs Lab 10/03/12 2035 10/04/12 0421  NA 143 140  K 3.8 3.2*  CL 106 106  CO2 29 27  GLUCOSE 104* 104*  BUN 9 8  CREATININE 0.58 0.57  CALCIUM 9.0 8.5   Liver Function Tests:  Recent Labs Lab 10/03/12 2035 10/04/12 0421  AST 13 13  ALT 12 12  ALKPHOS 66 57  BILITOT 0.1* 0.2*  PROT 7.1 5.9*  ALBUMIN 3.5 3.1*   No results found for this basename: LIPASE, AMYLASE,  in the last 168 hours  Recent Labs Lab 10/03/12 2035  AMMONIA 24   CBC:  Recent Labs Lab 10/03/12 2035 10/04/12 0421  WBC 9.8 9.3  NEUTROABS 6.3 4.7  HGB 12.5 11.8*  HCT 39.3 37.2  MCV 81.2 82.3  PLT 246 248   Cardiac Enzymes: No results found for  this basename: CKTOTAL, CKMB, CKMBINDEX, TROPONINI,  in the last 168 hours BNP (last 3 results) No results found for this basename: PROBNP,  in the last 8760 hours CBG:  Recent Labs Lab 10/03/12 1943  GLUCAP 124*    No results found for this or any previous visit (from the past 240 hour(s)).   Studies: Dg Chest 2 View  10/03/2012   *RADIOLOGY REPORT*  Clinical Data: Altered mental status  CHEST - 2 VIEW  Comparison: 09/18/2012 and earlier studies  Findings: Relatively low lung volumes with resultant crowding of bronchovascular structures.  No focal infiltrate or overt edema. No effusion.  Heart size normal.  Regional  bones unremarkable.  IMPRESSION:  Low volumes.  No acute disease.   Original Report Authenticated By: D. Andria Rhein, MD   Ct Head Wo Contrast  10/03/2012   *RADIOLOGY REPORT*  Clinical Data: Altered mental status.  CT HEAD WITHOUT CONTRAST  Technique:  Contiguous axial images were obtained from the base of the skull through the vertex without contrast.  Comparison: 08/02/2012.  Findings:  Calvarium: No acute osseous abnormality.  No lytic or blastic lesion.  Orbits: No acute abnormality.  Brain: No evidence of acute abnormality, including acute infarction, hemorrhage, hydrocephalus, or mass lesion/mass effect.  IMPRESSION: Negative head CT.   Original Report Authenticated By: Tiburcio Pea    Scheduled Meds: . sodium chloride   Intravenous STAT  . calcium-vitamin D  1 tablet Oral Daily  . vitamin B-12  2,500 mcg Oral QHS  . [START ON 10/05/2012] cyproheptadine  4 mg Oral QHS  . doxepin  50 mg Oral TID  . enoxaparin (LOVENOX) injection  40 mg Subcutaneous Q24H  . famotidine  20 mg Oral BID  . ferrous sulfate  325 mg Oral Q breakfast  . Methylphenidate HCl ER  60 mg Oral Daily  . OLANZapine  2.5 mg Oral QHS  . pantoprazole  40 mg Oral BID AC  . potassium chloride  40 mEq Oral BID  . sodium chloride  3 mL Intravenous Q12H  . sucralfate  1 g Oral QID   Continuous Infusions: . sodium chloride       Marinda Elk  [facility] Pager [phone]. If 8PM-8AM, please contact night-coverage at [email or web address], password [facility] 10/04/2012, 7:54 AM  LOS: 1 day

## 2012-10-04 NOTE — Progress Notes (Signed)
Report received from A Hodges RN, agree with am assessment. 

## 2012-10-05 ENCOUNTER — Other Ambulatory Visit (INDEPENDENT_AMBULATORY_CARE_PROVIDER_SITE_OTHER): Payer: Self-pay

## 2012-10-05 DIAGNOSIS — Z8669 Personal history of other diseases of the nervous system and sense organs: Secondary | ICD-10-CM

## 2012-10-05 DIAGNOSIS — E119 Type 2 diabetes mellitus without complications: Secondary | ICD-10-CM

## 2012-10-05 DIAGNOSIS — R4182 Altered mental status, unspecified: Secondary | ICD-10-CM | POA: Diagnosis not present

## 2012-10-05 DIAGNOSIS — B9681 Helicobacter pylori [H. pylori] as the cause of diseases classified elsewhere: Secondary | ICD-10-CM

## 2012-10-05 LAB — BASIC METABOLIC PANEL
BUN: 4 mg/dL — ABNORMAL LOW (ref 6–23)
CO2: 27 mEq/L (ref 19–32)
GFR calc non Af Amer: >90 mL/min (ref >90)
Glucose, Bld: 137 mg/dL — ABNORMAL HIGH (ref 70–99)
Potassium: 4.1 mEq/L (ref 3.5–5.1)

## 2012-10-05 MED ORDER — PANTOPRAZOLE SODIUM 40 MG PO TBEC: 40.0000 mg | DELAYED_RELEASE_TABLET | Freq: Every day | ORAL | Status: DC

## 2012-10-05 MED ORDER — DSS 100 MG PO CAPS: 100.0000 mg | ORAL_CAPSULE | Freq: Two times a day (BID) | ORAL | Status: DC

## 2012-10-05 MED ORDER — DOCUSATE SODIUM 100 MG PO CAPS
100.0000 mg | ORAL_CAPSULE | Freq: Two times a day (BID) | ORAL | Status: DC
  Administered 2012-10-05: 100 mg via ORAL
  Filled 2012-10-05 (×2): qty 1

## 2012-10-05 MED ORDER — PROMETHAZINE HCL 25 MG/ML IJ SOLN
12.5000 mg | Freq: Four times a day (QID) | INTRAMUSCULAR | Status: DC | PRN
  Administered 2012-10-05: 12.5 mg via INTRAVENOUS
  Filled 2012-10-05: qty 1

## 2012-10-05 MED ORDER — BISACODYL 10 MG RE SUPP
10.0000 mg | Freq: Once | RECTAL | Status: AC
  Administered 2012-10-05: 10 mg via RECTAL
  Filled 2012-10-05: qty 1

## 2012-10-05 NOTE — Discharge Summary (Signed)
Physician Discharge Summary  [name]:[id number] DOB: 1975-09-28 DOA: 10/03/2012  PCP: Reather Littler, MD  Admit date: 10/03/2012 Discharge date: 10/05/2012  Time spent: 40 minutes  Recommendations for Outpatient Follow-up:  1. Follow up with primary MD.  2. Follow up with Dr Ethelene Hal, per prior appointment. 3. Follow up with Primary GI,per prior appointment. 4. Follow up with primary psychiatrist per prior appointment.   Discharge Diagnoses:  Active Problems:   Hemiplegic migraine   Diabetes type 2, uncontrolled   Acute encephalopathy   Discharge Condition: Satisfactory.   Diet recommendation: Carbohydrate-Modified.   Filed Weights   10/04/12 0130  Weight: 92.897 kg (204 lb 12.8 oz)    History of present illness:  37 y.o. female with known history of anxiety/depression ADD, OSA, off CPAP, s/p gastric bypass, DM with neuropathy, GERD, dyslipidemia, seasonal allergies, migraines, DJD/Chronic back pain, brought to the ER after patient's husband found patient to be increasingly lethargic since morning. Patient was more arousable and able to provide minimal history at the time of admission, and stated that she has not slept well the previous night after she had increasing pain in her lower back and was therefore, feeling very lethargic. Denies having overdosed with any of her medications. Patient denied any headache, nausea vomiting, abdominal pain, diarrhea chest pain or shortness of breath. Labs and CT head and EKG chest x-ray were unremarkable. Urine drug screen was positive for benzodiazepines. Admitted for further management.   Hospital Course:  1. Acute encephalopathy: Paient presented with altered mental status, described as lethargy and hypersomnolence. She had no focal neurologic deficit, and head CT scan was devoid of acute findings. PCO2 on ABG was 45. PO2 87.0, and electrolytes are within normal limits except for a mild hypokalemia of 3.2 She was afebrile, urialysis was  negative, as was pregnancy test, and LFTs were unremarkable. UDS was positive for only for benzodiazepines. Most likely etiology is medication side effect. Patient's opioids and benzodiazepines were reduced, and as of 10/04/12, she was fully alert and oriented. Encephalopathy has resolved. Avanced diet on 10/05/12, without deleterious effect. .  2. Diabetes type 2, uncontrolled: Patient has a history of DM-2, but was not on diabetic medications pre-admission. Random blood glucose on presentation was 104. Suspect her diabetes is diet-controlled. CBGs remained normal, during hospitalization.  3. Hemiplegic migraine: Stable/Asymptomatic.  4. Chronic back pain: Not problematic at this time.  5. Constipation: Per patient, she had no bowel movement for the past 5 days.Abdomonal exam was entirely benign. Administered a single Dulcolax suppository 10/05/12, resulting in a large bowel movement. Placed on Colace.  6. GERD: Asymptomatic.   Procedures:  See Below.   Consultations:  N/A.   Discharge Exam: Filed Vitals:   10/04/12 1407 10/04/12 2213 10/05/12 0526 10/05/12 1432  BP: 116/73 124/75 126/70 128/77  Pulse: 82 65 65 78  Temp: 97.7 F (36.5 C) 98.2 F (36.8 C) 98.1 F (36.7 C) 97.8 F (36.6 C)  TempSrc: Oral Oral Oral Oral  Resp: 20 18 20 20   Height:      Weight:      SpO2: 98% 97% 99% 100%    General: Comfortable, alert, communicative, fully oriented, not short of breath at rest.  HEENT: No clinical pallor, no jaundice, no conjunctival injection or discharge. Hydration is fair.  NECK: Supple, JVP not seen, no carotid bruits, no palpable lymphadenopathy, no palpable goiter.  CHEST: Clinically clear to auscultation, no wheezes, no crackles.  HEART: Sounds 1 and 2 heard, normal, regular, no murmurs.  ABDOMEN: Moderately obese, soft, non-tender, no palpable organomegaly, no palpable masses, normal bowel sounds.  GENITALIA: Not examined.  LOWER EXTREMITIES: No pitting edema, palpable  peripheral pulses.  MUSCULOSKELETAL SYSTEM: Unremarkable.  CENTRAL NERVOUS SYSTEM: No focal neurologic deficit on gross examination.  Discharge Instructions      Discharge Orders   Future Appointments Provider Department Dept Phone   11/29/2012 10:30 AM Orvil Feil [place], RD [facility] [phone]   01/03/2013 8:15 AM [facility] [phone]   Future Orders Complete By Expires     Diet Carb Modified  As directed     Increase activity slowly  As directed         Medication List    STOP taking these medications       cyclobenzaprine 10 MG tablet  Commonly known as:  FLEXERIL     UNABLE TO FIND      TAKE these medications       ADULT GUMMY Chew  Chew 2 tablets by mouth daily.     ALPRAZolam 1 MG tablet  Commonly known as:  XANAX  Take 1-2 mg by mouth 4 (four) times daily as needed for anxiety. For anxiety     calcium-vitamin D 500-200 MG-UNIT per tablet  Commonly known as:  OSCAL WITH D  Take 1 tablet by mouth daily.     [employer] IRON 325 (65 FE) MG tablet  Generic drug:  ferrous sulfate  Take 325 mg by mouth daily with breakfast.     [employer] VITAMIN D3 PO  Take by mouth daily.     cyproheptadine 2 MG/5ML syrup  Commonly known as:  PERIACTIN  at bedtime. Take 46mls(8mg  total) at bedtime     doxepin 50 MG capsule  Commonly known as:  SINEQUAN  Take 50 mg by mouth 3 (three) times daily.     DSS 100 MG Caps  Take 100 mg by mouth 2 (two) times daily.     HYDROmorphone 4 MG tablet  Commonly known as:  DILAUDID  Take 4 mg by mouth 3 (three) times daily as needed for pain.     ketoconazole 2 % shampoo  Commonly known as:  NIZORAL  Apply 1 application topically daily as needed. For scalp itching/pain.     pantoprazole 40 MG tablet  Commonly known as:  PROTONIX  Take 1 tablet (40 mg total) by mouth daily.     promethazine 25 MG tablet  Commonly known as:  PHENERGAN  Take 1 tablet  (25 mg total) by mouth every 6 (six) hours as needed for nausea.     QUILLIVANT XR 25 MG/5ML Susr  Generic drug:  Methylphenidate HCl ER  Take 12 mLs by mouth daily.     sucralfate 1 G tablet  Commonly known as:  CARAFATE  Take 1 tablet (1 g total) by mouth 4 (four) times daily.     vitamin B-12 1000 MCG tablet  Commonly known as:  CYANOCOBALAMIN  Take 2,000 mcg by mouth daily.     ZANAFLEX 4 MG tablet  Generic drug:  tiZANidine  Take 4 mg by mouth every 6 (six) hours as needed. Take 1 tablet (4 mg total) by mouth every 8 (eight) hours as needed.     ZYPREXA 2.5 MG tablet  Generic drug:  OLANZapine  Take 2.5 mg by mouth at bedtime. Take up to TID for hemiplegic migraines       Allergies  Allergen Reactions  . Atarax (  Hydroxyzine Hcl) Anaphylaxis  . Cephalosporins Anaphylaxis  . Codeine Anaphylaxis    Tolerates Dilaudid and Percocet.  . Compazine Anaphylaxis    Tolerates Phenergan.  . Decadron (Dexamethasone) Anaphylaxis  . Demerol Anaphylaxis  . Metoclopramide Hcl Anaphylaxis  . Morphine And Related Anaphylaxis    Tolerates Dilaudid and Percocet.  Marland Kitchen Penicillins Anaphylaxis  . Sulfa Antibiotics Anaphylaxis  . Triptans Anaphylaxis  . Zofran Anaphylaxis  . Depo-Medrol (Methylprednisolone)     Pt is s/p RYGB; steroids increase risk of ulcers in these pts.  . Pork-Derived Products Other (See Comments)    Religous reasons--per pt ok to take Heparin when medically necessary  . Shellfish-Derived Products Other (See Comments)    Religous reasons   Follow-up Information   Follow up with [facility], [facility].   Contact information:   [address] [place] [address] [place] [address] [phone]       Please follow up. (Follow up with Dr Ethelene Hal, per scheduled appointment.)       Please follow up. (Folow up with Psychiatrist per scheduled appointment.)       Please follow up. (Follow up with GI, per scheduled appointment. )        The results of significant  diagnostics from this hospitalization (including imaging, microbiology, ancillary and laboratory) are listed below for reference.    Significant Diagnostic Studies: Ct Abdomen Pelvis Wo Contrast  09/18/2012   *RADIOLOGY REPORT*  Clinical Data: Left upper quadrant abdominal pain.  Prior gastric bypass.  CT ABDOMEN AND PELVIS WITHOUT CONTRAST  Technique:  Multidetector CT imaging of the abdomen and pelvis was performed following the standard protocol without intravenous contrast.  Comparison: Multiple exams, including 04/07/2012 and 04/24/2012  Findings: Dependent subsegmental atelectasis observed in both lower lobes.  Gastrojejunostomy appears patent with linear high signal along the anastomotic site to the favors staple line over ulceration.  No free intraperitoneal gas or definite leak of contrast.  The visualized portion of the liver, spleen, pancreas, and adrenal glands appear unremarkable in noncontrast CT appearance.  Gallbladder surgically absent. The kidneys appear unremarkable, as do the proximal ureters.  IUD appears satisfactorily positioned in the uterus.  Adnexa unremarkable.  Right inguinal lymph node 1.1 cm, within normal limits for location.  Appendix appears normal. Prominence of stool throughout the colon suggests constipation.  There is sclerosis along the iliac side of both sacroiliac joints.  IMPRESSION: 1.  Prominence of stool throughout the colon suggests constipation.  2.  Sclerosis along the iliac side of the sacroiliac joints, query mild osteitis condensans ilii. 3.  Gastrojejunostomy appears patent.  Linear increased signal along the anastomotic site is attributable to anastomotic staples; I do not see a definite ulceration in this region, nor is there extraluminal gas or contrast to suggest a leak.  Subtle ulceration might be better appreciated at endoscopy.   Original Report Authenticated By: Gaylyn Rong, M.D.   Dg Chest 2 View  10/03/2012   *RADIOLOGY REPORT*  Clinical Data:  Altered mental status  CHEST - 2 VIEW  Comparison: 09/18/2012 and earlier studies  Findings: Relatively low lung volumes with resultant crowding of bronchovascular structures.  No focal infiltrate or overt edema. No effusion.  Heart size normal.  Regional bones unremarkable.  IMPRESSION:  Low volumes.  No acute disease.   Original Report Authenticated By: D. Andria Rhein, MD   Ct Head Wo Contrast  10/03/2012   *RADIOLOGY REPORT*  Clinical Data: Altered mental status.  CT HEAD WITHOUT CONTRAST  Technique:  Contiguous axial  images were obtained from the base of the skull through the vertex without contrast.  Comparison: 08/02/2012.  Findings:  Calvarium: No acute osseous abnormality.  No lytic or blastic lesion.  Orbits: No acute abnormality.  Brain: No evidence of acute abnormality, including acute infarction, hemorrhage, hydrocephalus, or mass lesion/mass effect.  IMPRESSION: Negative head CT.   Original Report Authenticated By: Tiburcio Pea    Microbiology: No results found for this or any previous visit (from the past 240 hour(s)).   Labs: Basic Metabolic Panel:  Recent Labs Lab 10/03/12 2035 10/04/12 0421 10/05/12 1415  NA 143 140 140  K 3.8 3.2* 4.1  CL 106 106 101  CO2 29 27 27   GLUCOSE 104* 104* 137*  BUN 9 8 4*  CREATININE 0.58 0.57 0.61  CALCIUM 9.0 8.5 9.2   Liver Function Tests:  Recent Labs Lab 10/03/12 2035 10/04/12 0421  AST 13 13  ALT 12 12  ALKPHOS 66 57  BILITOT 0.1* 0.2*  PROT 7.1 5.9*  ALBUMIN 3.5 3.1*   No results found for this basename: LIPASE, AMYLASE,  in the last 168 hours  Recent Labs Lab 10/03/12 2035  AMMONIA 24   CBC:  Recent Labs Lab 10/03/12 2035 10/04/12 0421  WBC 9.8 9.3  NEUTROABS 6.3 4.7  HGB 12.5 11.8*  HCT 39.3 37.2  MCV 81.2 82.3  PLT 246 248   Cardiac Enzymes: No results found for this basename: CKTOTAL, CKMB, CKMBINDEX, TROPONINI,  in the last 168 hours BNP: BNP (last 3 results) No results found for this basename:  PROBNP,  in the last 8760 hours CBG:  Recent Labs Lab 10/03/12 1943  GLUCAP 124*       Signed:  Shauna Bodkins,CHRISTOPHER  [facility] 10/05/2012, 5:25 PM

## 2012-10-05 NOTE — Progress Notes (Signed)
[facility] PROGRESS NOTE  [name] ZOX:[id number] DOB: [birth year] DOA: 10/03/2012 PCP: Reather Littler, MD  Assessment/Plan: Active Problems:   Hemiplegic migraine   Diabetes type 2, uncontrolled   Acute encephalopathy    1. Acute encephalopathy: Paient presented with altered mental status, described as lethargy and hypersomnolence. She had no focal neurologic deficit, and head CT scan was devoid of acute findings. PCO2 on ABG was 45. PO2 87.0, and electrolytes are within normal limits except for a mild hypokalemia of 3.2 She was afebrile, urialysis was negative, as was pregnancy test, and LFTs were unremarkable. UDS was positive for only for benzodiazepines. Most likely etiology is medication side effect. Patient's opioids and benzodiazepines were reduced, and as of 10/04/12, she is fully alert and oriented. Will advance diet today.  2. Diabetes type 2, uncontrolled: Patient has a history of DM-2, but was not on diabetic medications pre-admission. Random blood gl;ucose on presentation was 104. Suspect her diabetes is diet-controlled. CBGs have remained normal, during hospitalization.  3. Hemiplegic migraine: Stable/Asymptomatic.  4. Chronic back pain: Not problematic at this time.  5. Constipation: Per patient, she had no bowel movement for the past 5 days.Abdomonal exam was entirely benign. Administered a single Dulcolax suppository today, resulting in a large bowel movement. Placed on Colace.  6. GERD: Asymptomatic.   Code Status: Full Code.  Family Communication:  Disposition Plan: To be determined. Possible discharge later today.    Brief narrative: 37 y.o. female with known history of anxiety/depression ADD, OSA, off CPAP, s/p gastric bypass, DM with neuropathy, GERD, dyslipidemia, seasonal allergies, migraines, DJD/Chronic back pain, brought to the ER after patient's husband found patient to be increasingly lethargic since morning. Patient was more arousable and able to  provide minimal history at the time of admission, and stated that she has not slept well the previous night after she had increasing pain in her lower back and was therefore, feeling very lethargic. Denies having overdosed with any of her medications. Patient denied any headache, nausea vomiting, abdominal pain, diarrhea chest pain or shortness of breath. Labs and CT head and EKG chest x-ray were unremarkable. Urine drug screen was positive for benzodiazepines. Admitted for further management.   Consultants:  N/A.   Procedures:  CXR.  Head CT scan.   Antibiotics:  N/A.   HPI/Subjective: Mild nausea. Wants to advance diet.   Objective: Vital signs in last 24 hours: Temp:  [97.7 F (36.5 C)-98.2 F (36.8 C)] 98.1 F (36.7 C) (07/16 0526) Pulse Rate:  [65-82] 65 (07/16 0526) Resp:  [18-20] 20 (07/16 0526) BP: (116-126)/(70-75) 126/70 mmHg (07/16 0526) SpO2:  [97 %-99 %] 99 % (07/16 0526) Weight change:  Last BM Date:  (PTA)  Intake/Output from previous day: 07/15 0701 - 07/16 0700 In: 2153.8 [P.O.:760; I.V.:1393.8] Out: -  Total I/O In: 60 [P.O.:60] Out: 1300 [Urine:1300]   Physical Exam: General: Comfortable, alert, communicative, fully oriented, not short of breath at rest.  HEENT:  No clinical pallor, no jaundice, no conjunctival injection or discharge. Hydration is fair.  NECK:  Supple, JVP not seen, no carotid bruits, no palpable lymphadenopathy, no palpable goiter. CHEST:  Clinically clear to auscultation, no wheezes, no crackles. HEART:  Sounds 1 and 2 heard, normal, regular, no murmurs. ABDOMEN:  Moderately obese, soft, non-tender, no palpable organomegaly, no palpable masses, normal bowel sounds. GENITALIA:  Not examined. LOWER EXTREMITIES:  No pitting edema, palpable peripheral pulses. MUSCULOSKELETAL SYSTEM:  Unremarkable. CENTRAL NERVOUS SYSTEM:  No focal neurologic deficit on gross examination.  Lab Results:  Recent Labs  10/03/12 2035  10/04/12 0421  WBC 9.8 9.3  HGB 12.5 11.8*  HCT 39.3 37.2  PLT 246 248    Recent Labs  10/03/12 2035 10/04/12 0421  NA 143 140  K 3.8 3.2*  CL 106 106  CO2 29 27  GLUCOSE 104* 104*  BUN 9 8  CREATININE 0.58 0.57  CALCIUM 9.0 8.5   No results found for this or any previous visit (from the past 240 hour(s)).   Studies/Results: Dg Chest 2 View  10/03/2012   *RADIOLOGY REPORT*  Clinical Data: Altered mental status  CHEST - 2 VIEW  Comparison: 09/18/2012 and earlier studies  Findings: Relatively low lung volumes with resultant crowding of bronchovascular structures.  No focal infiltrate or overt edema. No effusion.  Heart size normal.  Regional bones unremarkable.  IMPRESSION:  Low volumes.  No acute disease.   Original Report Authenticated By: D. Andria Rhein, MD   Ct Head Wo Contrast  10/03/2012   *RADIOLOGY REPORT*  Clinical Data: Altered mental status.  CT HEAD WITHOUT CONTRAST  Technique:  Contiguous axial images were obtained from the base of the skull through the vertex without contrast.  Comparison: 08/02/2012.  Findings:  Calvarium: No acute osseous abnormality.  No lytic or blastic lesion.  Orbits: No acute abnormality.  Brain: No evidence of acute abnormality, including acute infarction, hemorrhage, hydrocephalus, or mass lesion/mass effect.  IMPRESSION: Negative head CT.   Original Report Authenticated By: Tiburcio Pea    Medications: Scheduled Meds: . calcium-vitamin D  1 tablet Oral Daily  . vitamin B-12  2,500 mcg Oral QHS  . cyproheptadine  4 mg Oral QHS  . docusate sodium  100 mg Oral BID  . doxepin  50 mg Oral TID  . enoxaparin (LOVENOX) injection  40 mg Subcutaneous Q24H  . famotidine  20 mg Oral BID  . ferrous sulfate  325 mg Oral Q breakfast  . Methylphenidate HCl ER  60 mg Oral Daily  . OLANZapine  2.5 mg Oral QHS  . pantoprazole  40 mg Oral BID AC  . sodium chloride  3 mL Intravenous Q12H  . sucralfate  1 g Oral QID   Continuous Infusions:  PRN  Meds:.acetaminophen, acetaminophen, ALPRAZolam, cyclobenzaprine, HYDROmorphone, OLANZapine    LOS: 2 days   [name],CHRISTOPHER  [facility] Pager [phone]. If 8PM-8AM, please contact night-coverage at [email or web address], password [email or web address] 10/05/2012, 1:18 PM  LOS: 2 days

## 2012-10-05 NOTE — Progress Notes (Signed)
Pt complaining of chest tightness that woke pt up from sleep.  This chest tightness was also accompanied with trouble breathing according to the pt.  Pt states at times she finds herself gasping for a breath.  Oxygen saturation 99% on RA. BP 118/76.  Pt assisted to [facility] and assisted back to bed.  Pt in NAD at this time.  Will continue to monitor pt throughout night.

## 2012-10-20 ENCOUNTER — Encounter (INDEPENDENT_AMBULATORY_CARE_PROVIDER_SITE_OTHER): Payer: Self-pay

## 2012-10-25 ENCOUNTER — Encounter (INDEPENDENT_AMBULATORY_CARE_PROVIDER_SITE_OTHER): Payer: Self-pay

## 2012-11-02 ENCOUNTER — Telehealth (INDEPENDENT_AMBULATORY_CARE_PROVIDER_SITE_OTHER): Payer: Self-pay | Admitting: General Surgery

## 2012-11-02 ENCOUNTER — Encounter (HOSPITAL_COMMUNITY): Payer: Self-pay | Admitting: *Deleted

## 2012-11-02 ENCOUNTER — Emergency Department (HOSPITAL_COMMUNITY)
Admission: EM | Admit: 2012-11-02 | Discharge: 2012-11-03 | Disposition: A | Discharge status: Home / Self Care | Payer: BC Managed Care – PPO | Attending: Emergency Medicine | Admitting: Emergency Medicine

## 2012-11-02 DIAGNOSIS — G4730 Sleep apnea, unspecified: Secondary | ICD-10-CM | POA: Insufficient documentation

## 2012-11-02 DIAGNOSIS — Z8669 Personal history of other diseases of the nervous system and sense organs: Secondary | ICD-10-CM | POA: Insufficient documentation

## 2012-11-02 DIAGNOSIS — Z862 Personal history of diseases of the blood and blood-forming organs and certain disorders involving the immune mechanism: Secondary | ICD-10-CM | POA: Insufficient documentation

## 2012-11-02 DIAGNOSIS — K219 Gastro-esophageal reflux disease without esophagitis: Secondary | ICD-10-CM | POA: Insufficient documentation

## 2012-11-02 DIAGNOSIS — Z880 Allergy status to penicillin: Secondary | ICD-10-CM | POA: Insufficient documentation

## 2012-11-02 DIAGNOSIS — Z3202 Encounter for pregnancy test, result negative: Secondary | ICD-10-CM | POA: Insufficient documentation

## 2012-11-02 DIAGNOSIS — G43909 Migraine, unspecified, not intractable, without status migrainosus: Secondary | ICD-10-CM | POA: Insufficient documentation

## 2012-11-02 DIAGNOSIS — R011 Cardiac murmur, unspecified: Secondary | ICD-10-CM | POA: Insufficient documentation

## 2012-11-02 DIAGNOSIS — Z79899 Other long term (current) drug therapy: Secondary | ICD-10-CM | POA: Insufficient documentation

## 2012-11-02 DIAGNOSIS — Z8739 Personal history of other diseases of the musculoskeletal system and connective tissue: Secondary | ICD-10-CM | POA: Insufficient documentation

## 2012-11-02 DIAGNOSIS — F329 Major depressive disorder, single episode, unspecified: Secondary | ICD-10-CM | POA: Insufficient documentation

## 2012-11-02 DIAGNOSIS — R231 Pallor: Secondary | ICD-10-CM | POA: Insufficient documentation

## 2012-11-02 DIAGNOSIS — F3289 Other specified depressive episodes: Secondary | ICD-10-CM | POA: Insufficient documentation

## 2012-11-02 DIAGNOSIS — F411 Generalized anxiety disorder: Secondary | ICD-10-CM | POA: Insufficient documentation

## 2012-11-02 DIAGNOSIS — F988 Other specified behavioral and emotional disorders with onset usually occurring in childhood and adolescence: Secondary | ICD-10-CM | POA: Insufficient documentation

## 2012-11-02 DIAGNOSIS — R1084 Generalized abdominal pain: Secondary | ICD-10-CM | POA: Insufficient documentation

## 2012-11-02 DIAGNOSIS — Z8639 Personal history of other endocrine, nutritional and metabolic disease: Secondary | ICD-10-CM | POA: Insufficient documentation

## 2012-11-02 DIAGNOSIS — R110 Nausea: Secondary | ICD-10-CM | POA: Insufficient documentation

## 2012-11-02 DIAGNOSIS — R0602 Shortness of breath: Secondary | ICD-10-CM | POA: Insufficient documentation

## 2012-11-02 DIAGNOSIS — E1142 Type 2 diabetes mellitus with diabetic polyneuropathy: Secondary | ICD-10-CM | POA: Insufficient documentation

## 2012-11-02 DIAGNOSIS — R109 Unspecified abdominal pain: Secondary | ICD-10-CM

## 2012-11-02 DIAGNOSIS — E1149 Type 2 diabetes mellitus with other diabetic neurological complication: Secondary | ICD-10-CM | POA: Insufficient documentation

## 2012-11-02 NOTE — ED Notes (Signed)
Pt states she's having L sided abdominal pain, hx of gastric bypass surgery, states hasn't been able to eat much w/o pain, pt states been having nausea and diarrhea, denies vomiting. Denies urinary symptoms.

## 2012-11-02 NOTE — Telephone Encounter (Signed)
Pt called tonight complaining of continued left upper quadrant pain. No vomiting. No fever. She has a history of marginal ulcers. She is still on protonix. I instructed her to go to the ER for evaluation if her pain is bad. If not then we need to move her follow up appt up with Dr. Johna Sheriff

## 2012-11-03 ENCOUNTER — Emergency Department (HOSPITAL_COMMUNITY): Payer: BC Managed Care – PPO

## 2012-11-03 ENCOUNTER — Encounter (HOSPITAL_COMMUNITY): Payer: Self-pay | Admitting: Radiology

## 2012-11-03 LAB — COMPREHENSIVE METABOLIC PANEL
Alkaline Phosphatase: 133 U/L — ABNORMAL HIGH (ref 39–117)
BUN: 9 mg/dL (ref 6–23)
CO2: 28 mEq/L (ref 19–32)
Chloride: 101 mEq/L (ref 96–112)
Creatinine, Ser: 0.5 mg/dL (ref 0.50–1.10)
GFR calc Af Amer: >90 mL/min (ref >90)
GFR calc non Af Amer: >90 mL/min (ref >90)
Glucose, Bld: 100 mg/dL — ABNORMAL HIGH (ref 70–99)
Potassium: 3.9 mEq/L (ref 3.5–5.1)
Total Bilirubin: 0.2 mg/dL — ABNORMAL LOW (ref 0.3–1.2)

## 2012-11-03 LAB — CBC
HCT: 38.5 % (ref 36.0–46.0)
Hemoglobin: 12.6 g/dL (ref 12.0–15.0)
MCV: 81.6 fL (ref 78.0–100.0)
RBC: 4.72 MIL/uL (ref 3.87–5.11)
RDW: 13.6 % (ref 11.5–15.5)
WBC: 6.6 10*3/uL (ref 4.0–10.5)

## 2012-11-03 LAB — URINALYSIS, ROUTINE W REFLEX MICROSCOPIC
Bilirubin Urine: NEGATIVE
Glucose, UA: NEGATIVE mg/dL
Hgb urine dipstick: NEGATIVE
Specific Gravity, Urine: 1.022 (ref 1.005–1.030)
Urobilinogen, UA: 1 mg/dL (ref 0.0–1.0)

## 2012-11-03 LAB — PREGNANCY, URINE: Preg Test, Ur: NEGATIVE

## 2012-11-03 LAB — LIPASE, BLOOD: Lipase: 17 U/L (ref 11–59)

## 2012-11-03 MED ORDER — PROMETHAZINE HCL 25 MG/ML IJ SOLN
12.5000 mg | Freq: Once | INTRAMUSCULAR | Status: AC
  Administered 2012-11-03: 12.5 mg via INTRAVENOUS
  Filled 2012-11-03: qty 1

## 2012-11-03 MED ORDER — PROMETHAZINE HCL 25 MG/ML IJ SOLN
12.5000 mg | Freq: Once | INTRAMUSCULAR | Status: DC
  Filled 2012-11-03: qty 1

## 2012-11-03 MED ORDER — PROMETHAZINE HCL 25 MG PO TABS: 25.0000 mg | ORAL_TABLET | Freq: Three times a day (TID) | ORAL | Status: DC | PRN

## 2012-11-03 MED ORDER — ONDANSETRON HCL 4 MG/2ML IJ SOLN
4.0000 mg | Freq: Once | INTRAMUSCULAR | Status: DC
  Filled 2012-11-03: qty 2

## 2012-11-03 MED ORDER — HYDROMORPHONE HCL PF 1 MG/ML IJ SOLN
1.0000 mg | Freq: Once | INTRAMUSCULAR | Status: AC
  Administered 2012-11-03: 1 mg via INTRAVENOUS
  Filled 2012-11-03: qty 1

## 2012-11-03 MED ORDER — IOHEXOL 300 MG/ML  SOLN
100.0000 mL | Freq: Once | INTRAMUSCULAR | Status: AC | PRN
  Administered 2012-11-03: 100 mL via INTRAVENOUS

## 2012-11-03 MED ORDER — IOHEXOL 300 MG/ML  SOLN
50.0000 mL | Freq: Once | INTRAMUSCULAR | Status: AC | PRN
  Administered 2012-11-03: 50 mL via ORAL

## 2012-11-03 MED ORDER — HYDROMORPHONE HCL PF 1 MG/ML IJ SOLN
0.5000 mg | Freq: Once | INTRAMUSCULAR | Status: AC
  Administered 2012-11-03: 0.5 mg via INTRAVENOUS
  Filled 2012-11-03: qty 1

## 2012-11-03 NOTE — ED Provider Notes (Signed)
  Physical Exam  BP 112/75  Pulse 80  Temp(Src) 97.4 F (36.3 C) (Oral)  Resp 16  Ht 5\' 7"  (1.702 m)  Wt 198 lb (89.812 kg)  BMI 31 kg/m2  SpO2 98%  Physical Exam  ED Course  Procedures  Patient was able to tolerate oral fluids. The patient was seen by surgery. Told to return here as needed. Patient asking for food to eat    Carlyle Dolly, PA-C 11/03/12 1246

## 2012-11-03 NOTE — ED Provider Notes (Signed)
CSN: [id number]     Arrival date & time 11/02/12  2327 History     First MD Initiated Contact with Patient 11/03/12 [phone]     Chief Complaint  Patient presents with  . Abdominal Pain   (Consider location/radiation/quality/duration/timing/severity/associated sxs/prior Treatment) HPI Comments: Patient states she's has diffuse abdominal pain, starting early this, morning.  She's taken a total of 8 mg of Dilaudid without any relief.  She has a history of gastric bypass and ulcers.  She had an endoscopy in April, which showed that the ulcers had healed.  She did call Dr. Johna Sheriff, who instructed her to come to the emergency room for further evaluation  Patient is a 37 y.o. female presenting with abdominal pain. The history is provided by the patient.  Abdominal Pain Pain location:  Generalized Pain quality: aching   Pain severity:  Severe Onset quality:  Gradual Duration:  6 hours Timing:  Constant Progression:  Worsening Context: not eating, not laxative use, not medication withdrawal, not previous surgeries and not recent illness   Relieved by:  Nothing Worsened by:  Position changes Ineffective treatments: Dilaudid. Associated symptoms: nausea and shortness of breath   Associated symptoms: no diarrhea, no dysuria and no fever     Past Medical History  Diagnosis Date  . Depression   . Anxiety   . Diabetes mellitus   . GERD (gastroesophageal reflux disease)   . ADD (attention deficit disorder)   . Hyperlipemia   . Herniated disc   . Allergy     Seasonal  . Diabetic neuropathy   . Shortness of breath     with exertion   . Sleep apnea     obsructive settings at 13   . Heart murmur     asa child  . Seizures     hx of / month ago - dehydration   . 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 Degenerative disc disease   . Annular disc tear   . Retrolisthesis     L4 and L5  . Bulging disc    Past Surgical History   Procedure Laterality Date  . Cholecystectomy    . Cesarean section    . Tonsillectomy    . Laporoscopy  2007  . Breath tek h pylori  09/11/2011    Procedure: BREATH TEK H PYLORI;  Surgeon: Mariella Saa, MD;  Location: [facility];  Service: General;  Laterality: N/A;  . 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 Bowel resection  11/30/2011    Procedure: SMALL BOWEL RESECTION;  Surgeon: Mariella Saa, MD;  Location: [facility];  Service: General;;  . Esophagogastroduodenoscopy  03/14/2012    Procedure: ESOPHAGOGASTRODUODENOSCOPY (EGD);  Surgeon: Kandis Cocking, MD;  Location: [facility];  Service: General;  Laterality: N/A;  . Esophagogastroduodenoscopy  04/25/2012    Procedure: ESOPHAGOGASTRODUODENOSCOPY (EGD);  Surgeon: Kandis Cocking, MD;  Location: [facility];  Service: General;  Laterality: N/A;   Family History  Problem Relation Age of Onset  . Diabetes type II    . Diabetes Mother   . Hypertension Mother   . Depression Brother   . Cancer Maternal Aunt     ovarian   . Depression Maternal Aunt   . Diabetes Maternal Aunt   . Cancer Maternal Grandmother  Lung  . COPD Maternal Grandmother   . Cancer Maternal Grandfather     Prostate  . Diabetes Maternal Grandfather   . Hearing loss Maternal Grandfather   . Hypertension Maternal Grandfather   . Stroke Maternal Grandfather   . Cancer Paternal Grandmother     Colon   History  Substance Use Topics  . Smoking status: Never Smoker   . Smokeless tobacco: Never Used  . Alcohol Use: No   OB History   Grav Para Term Preterm Abortions TAB SAB Ect Mult Living                 Review of Systems  Constitutional: Negative for fever.  Respiratory: Positive for shortness of breath.   Gastrointestinal: Positive for nausea and abdominal pain. Negative for diarrhea.  Genitourinary: Negative for  dysuria.  Musculoskeletal: Negative for back pain.    Allergies  Atarax; Cephalosporins; Codeine; Compazine; Decadron; Demerol; Metoclopramide hcl; Morphine and related; Penicillins; Sulfa antibiotics; Triptans; Zofran; Depo-medrol; Pork-derived products; and Shellfish-derived products  Home Medications   Current Outpatient Rx  Name  Route  Sig  Dispense  Refill  . ALPRAZolam (XANAX) 1 MG tablet   Oral   Take 1-2 mg by mouth 4 (four) times daily as needed for anxiety. For anxiety         . calcium-vitamin D (OSCAL WITH D) 500-200 MG-UNIT per tablet   Oral   Take 1 tablet by mouth daily.         . Cholecalciferol ([employer]S VITAMIN D3 PO)   Oral   Take by mouth daily.         . cyproheptadine (PERIACTIN) 2 MG/5ML syrup      at bedtime. Take 28mls(8mg  total) at bedtime         . docusate sodium 100 MG CAPS   Oral   Take 100 mg by mouth 2 (two) times daily.   60 capsule   0   . doxepin (SINEQUAN) 50 MG capsule   Oral   Take 50 mg by mouth 3 (three) times daily.         . ferrous sulfate (CVS IRON) 325 (65 FE) MG tablet   Oral   Take 325 mg by mouth daily with breakfast.         . HYDROmorphone (DILAUDID) 4 MG tablet   Oral   Take 4 mg by mouth 3 (three) times daily as needed for pain.         . Methylphenidate HCl ER (QUILLIVANT XR) 25 MG/5ML SUSR   Oral   Take 12 mLs by mouth daily.         . Multiple Vitamins-Minerals (ADULT GUMMY) CHEW   Oral   Chew 2 tablets by mouth daily.         Marland Kitchen OLANZapine (ZYPREXA) 2.5 MG tablet   Oral   Take 2.5 mg by mouth at bedtime. Take up to TID for hemiplegic migraines         . pantoprazole (PROTONIX) 40 MG tablet   Oral   Take 1 tablet (40 mg total) by mouth daily.   60 tablet   2   . promethazine (PHENERGAN) 25 MG tablet   Oral   Take 1 tablet (25 mg total) by mouth every 6 (six) hours as needed for nausea.   15 tablet   0   . senna (SENOKOT) 8.6 MG tablet   Oral   Take 1 tablet by mouth daily.          Marland Kitchen  vitamin B-12 (CYANOCOBALAMIN) 1000 MCG tablet   Oral   Take 2,000 mcg by mouth daily.         Marland Kitchen ketoconazole (NIZORAL) 2 % shampoo   Topical   Apply 1 application topically daily as needed. For scalp itching/pain.         . promethazine (PHENERGAN) 25 MG tablet   Oral   Take 1 tablet (25 mg total) by mouth every 8 (eight) hours as needed for nausea.   15 tablet   0    BP 125/75  Pulse 100  Temp(Src) 97.4 F (36.3 C) (Oral)  Resp 20  Ht 5\' 7"  (1.702 m)  Wt 198 lb (89.812 kg)  BMI 31 kg/m2  SpO2 98% Physical Exam  Nursing note and vitals reviewed. Constitutional: She is oriented to person, place, and time. She appears well-developed and well-nourished.  HENT:  Head: Normocephalic.  Neck: Normal range of motion.  Cardiovascular: Normal rate and regular rhythm.   Pulmonary/Chest: Breath sounds normal.  Abdominal: Soft. She exhibits no distension. Bowel sounds are decreased. There is no hepatosplenomegaly. There is generalized tenderness. There is no guarding and no CVA tenderness.  Musculoskeletal: Normal range of motion.  Neurological: She is alert and oriented to person, place, and time.  Skin: Skin is warm and dry. There is pallor.    ED Course   Procedures (including critical care time)  Labs Reviewed  COMPREHENSIVE METABOLIC PANEL - Abnormal; Notable for the following:    Glucose, Bld 100 (*)    Albumin 3.4 (*)    AST 156 (*)    ALT 203 (*)    Alkaline Phosphatase 133 (*)    Total Bilirubin 0.2 (*)    All other components within normal limits  URINALYSIS, ROUTINE W REFLEX MICROSCOPIC  PREGNANCY, URINE  CBC  LIPASE, BLOOD   US Abdomen Complete  11/03/2012   *RADIOLOGY REPORT*  Clinical Data:  Abdominal pain.  History of cholecystectomy and gastric bypass.  COMPLETE ABDOMINAL ULTRASOUND  Comparison:  Abdomen pelvis CT 09/18/2012.  Findings:  Gallbladder:  Cholecystectomy.  Common bile duct:  Measures 5 mm  Liver:  Borderline increased  echogenicity, possibly mild fatty infiltration.  No focal abnormality.  IVC:  Appears normal.  Pancreas:  No focal abnormality seen.  Spleen:  Measures 10 cm.  No focal abnormality.  Right Kidney:  Measures 10.5 cm, no focal abnormality or hydronephrosis.  Left Kidney:  Measures 11 cm, no focal abnormality or hydronephrosis.  Abdominal aorta:  Proximal and midportion is nonaneurysmal, measuring up to 1.7 cm. The distal aorta is obscured by bowel gas.  IMPRESSION:  1.  Negative abdominal ultrasound.  2.  Status post cholecystectomy.   Original Report Authenticated By: Tiburcio Pea   Ct Abdomen Pelvis W Contrast  11/03/2012   *RADIOLOGY REPORT*  Clinical Data: Left-sided abdominal pain.  History of prior gastric bypass surgery.  Nausea and diarrhea.  CT ABDOMEN AND PELVIS WITH CONTRAST  Technique:  Multidetector CT imaging of the abdomen and pelvis was performed following the standard protocol during bolus administration of intravenous contrast.  Contrast: 50mL OMNIPAQUE IOHEXOL 300 MG/ML  SOLN, OMNIPAQUE IOHEXOL 300 MG/ML  SOLN  Comparison: CT of the abdomen and pelvis 09/18/2012.  Findings:  Lung Bases: Dependent atelectasis in the lower lobes of the lungs bilaterally.  Abdomen/Pelvis:  Status post cholecystectomy.  The appearance of the liver, pancreas, spleen, bilateral adrenal glands and bilateral kidneys is unremarkable.  Postoperative changes of gastric bypass procedure are noted.  No  significant volume of ascites.  No pneumoperitoneum.  No pathologic distension of small bowel.  Large volume of stool throughout the colon may suggest constipation. IUD in place within the endometrial canal.  Uterus and ovaries are otherwise unremarkable in appearance.  No definite pathologic lymphadenopathy identified within the abdomen or pelvis. Urinary bladder is normal in appearance.  Musculoskeletal: There are no aggressive appearing lytic or blastic lesions noted in the visualized portions of the skeleton.   IMPRESSION: 1.  No acute findings in the abdomen or pelvis to account for the patient's symptoms. 2.  However, there is a relatively large volume of stool throughout the colon which may suggest constipation. 3.  Status post cholecystectomy. 4.  IUD in position.   Original Report Authenticated By: Trudie Reed, M.D.   1. Abdominal pain     MDM   Reviewed Lab values LFTs, elevated.  Will obtain abdominal ultrasound  Arman Filter, NP 11/03/12 2137

## 2012-11-03 NOTE — ED Notes (Signed)
Patient also reporting nausea, Thayer Ohm PA notified.

## 2012-11-03 NOTE — Consult Note (Signed)
Reason for Consult: abdominal pain Referring Physician: Charlestine Night, PA  [name] is an 37 y.o. female.  HPI: known to our service for prior RYGB.  She has had marginal ulcers and chronic abdominal pain. She also has chronic back pain and is on dilaudid from her pain management physician.  She says that she had acute onset of left upper quadrant abdominal pain beginning last night while driving back from [place]. She says it feels as though "someone's punching her abdomen". His only partially relieved with her Dilaudid which he takes chronically. She says that she cannot keep any liquids or food down. She says that she has nausea and vomiting of clear fluid. She last moved her bowels yesterday. She denies any blood in her stools or black stools  Past Medical History  Diagnosis Date  . Depression   . Anxiety   . Diabetes mellitus   . GERD (gastroesophageal reflux disease)   . ADD (attention deficit disorder)   . Hyperlipemia   . Herniated disc   . Allergy     Seasonal  . Diabetic neuropathy   . Shortness of breath     with exertion   . Sleep apnea     obsructive settings at 13   . Heart murmur     asa child  . Seizures     hx of / month ago - dehydration   . 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 Degenerative disc disease   . Annular disc tear   . Retrolisthesis     L4 and L5  . Bulging disc     Past Surgical History  Procedure Laterality Date  . Cholecystectomy    . Cesarean section    . Tonsillectomy    . Laporoscopy  2007  . Breath tek h pylori  09/11/2011    Procedure: BREATH TEK H PYLORI;  Surgeon: Mariella Saa, MD;  Location: [facility];  Service: General;  Laterality: N/A;  . 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 Bowel resection  11/30/2011    Procedure: SMALL  BOWEL RESECTION;  Surgeon: Mariella Saa, MD;  Location: [facility];  Service: General;;  . Esophagogastroduodenoscopy  03/14/2012    Procedure: ESOPHAGOGASTRODUODENOSCOPY (EGD);  Surgeon: Kandis Cocking, MD;  Location: [facility];  Service: General;  Laterality: N/A;  . Esophagogastroduodenoscopy  04/25/2012    Procedure: ESOPHAGOGASTRODUODENOSCOPY (EGD);  Surgeon: Kandis Cocking, MD;  Location: [facility];  Service: General;  Laterality: N/A;    Family History  Problem Relation Age of Onset  . Diabetes type II    . Diabetes Mother   . Hypertension Mother   . Depression Brother   . Cancer Maternal Aunt     ovarian   . Depression Maternal Aunt   . Diabetes Maternal Aunt   . Cancer Maternal Grandmother     Lung  . COPD Maternal Grandmother   . Cancer Maternal Grandfather     Prostate  . Diabetes Maternal Grandfather   . Hearing loss Maternal Grandfather   . Hypertension Maternal Grandfather   . Stroke Maternal Grandfather   . Cancer Paternal Grandmother     Colon    Social History:  reports that she has never smoked. She has never used smokeless tobacco. She reports that she  does not drink alcohol or use illicit drugs.  Allergies:  Allergies  Allergen Reactions  . Atarax [Hydroxyzine Hcl] Anaphylaxis  . Cephalosporins Anaphylaxis  . Codeine Anaphylaxis    Tolerates Dilaudid and Percocet.  . Compazine Anaphylaxis    Tolerates Phenergan.  . Decadron [Dexamethasone] Anaphylaxis  . Demerol Anaphylaxis  . Metoclopramide Hcl Anaphylaxis  . Morphine And Related Anaphylaxis    Tolerates Dilaudid and Percocet.  Marland Kitchen Penicillins Anaphylaxis  . Sulfa Antibiotics Anaphylaxis  . Triptans Anaphylaxis  . Zofran Anaphylaxis  . Depo-Medrol [Methylprednisolone]     Pt is s/p RYGB; steroids increase risk of ulcers in these pts.  . Pork-Derived Products Other (See Comments)    Religous reasons--per pt ok to take Heparin when medically necessary  . Shellfish-Derived Products Other  (See Comments)    Religous reasons    Medications: I have reviewed the patient's current medications.  Results for orders placed during the hospital encounter of 11/02/12 (from the past 48 hour(s))  CBC     Status: None   Collection Time    11/03/12 12:44 AM      Result Value Range   WBC 6.6  4.0 - 10.5 K/uL   RBC 4.72  3.87 - 5.11 MIL/uL   Hemoglobin 12.6  12.0 - 15.0 g/dL   HCT 16.1  09.6 - 04.5 %   MCV 81.6  78.0 - 100.0 fL   MCH 26.7  26.0 - 34.0 pg   MCHC 32.7  30.0 - 36.0 g/dL   RDW 40.9  81.1 - 91.4 %   Platelets 246  150 - 400 K/uL  COMPREHENSIVE METABOLIC PANEL     Status: Abnormal   Collection Time    11/03/12 12:44 AM      Result Value Range   Sodium 135  135 - 145 mEq/L   Potassium 3.9  3.5 - 5.1 mEq/L   Chloride 101  96 - 112 mEq/L   CO2 28  19 - 32 mEq/L   Glucose, Bld 100 (*) 70 - 99 mg/dL   BUN 9  6 - 23 mg/dL   Creatinine, Ser 7.82  0.50 - 1.10 mg/dL   Calcium 8.8  8.4 - 95.6 mg/dL   Total Protein 6.7  6.0 - 8.3 g/dL   Albumin 3.4 (*) 3.5 - 5.2 g/dL   AST 213 (*) 0 - 37 U/L   ALT 203 (*) 0 - 35 U/L   Alkaline Phosphatase 133 (*) 39 - 117 U/L   Total Bilirubin 0.2 (*) 0.3 - 1.2 mg/dL   GFR calc non Af Amer >90  >90 mL/min   GFR calc Af Amer >90  >90 mL/min   Comment: (NOTE)     The eGFR has been calculated using the CKD EPI equation.     This calculation has not been validated in all clinical situations.     eGFR's persistently <90 mL/min signify possible Chronic Kidney     Disease.  LIPASE, BLOOD     Status: None   Collection Time    11/03/12 12:44 AM      Result Value Range   Lipase 17  11 - 59 U/L  URINALYSIS, ROUTINE W REFLEX MICROSCOPIC     Status: None   Collection Time    11/03/12  4:16 AM      Result Value Range   Color, Urine YELLOW  YELLOW   APPearance CLEAR  CLEAR   Specific Gravity, Urine 1.022  1.005 - 1.030   pH 6.0  5.0 - 8.0   Glucose, UA NEGATIVE  NEGATIVE mg/dL   Hgb urine dipstick NEGATIVE  NEGATIVE   Bilirubin Urine  NEGATIVE  NEGATIVE   Ketones, ur NEGATIVE  NEGATIVE mg/dL   Protein, ur NEGATIVE  NEGATIVE mg/dL   Urobilinogen, UA 1.0  0.0 - 1.0 mg/dL   Nitrite NEGATIVE  NEGATIVE   Leukocytes, UA NEGATIVE  NEGATIVE   Comment: MICROSCOPIC NOT DONE ON URINES WITH NEGATIVE PROTEIN, BLOOD, LEUKOCYTES, NITRITE, OR GLUCOSE <1000 mg/dL.  PREGNANCY, URINE     Status: None   Collection Time    11/03/12  4:16 AM      Result Value Range   Preg Test, Ur NEGATIVE  NEGATIVE   Comment:            THE SENSITIVITY OF THIS     METHODOLOGY IS >20 mIU/mL.    US Abdomen Complete  11/03/2012   *RADIOLOGY REPORT*  Clinical Data:  Abdominal pain.  History of cholecystectomy and gastric bypass.  COMPLETE ABDOMINAL ULTRASOUND  Comparison:  Abdomen pelvis CT 09/18/2012.  Findings:  Gallbladder:  Cholecystectomy.  Common bile duct:  Measures 5 mm  Liver:  Borderline increased echogenicity, possibly mild fatty infiltration.  No focal abnormality.  IVC:  Appears normal.  Pancreas:  No focal abnormality seen.  Spleen:  Measures 10 cm.  No focal abnormality.  Right Kidney:  Measures 10.5 cm, no focal abnormality or hydronephrosis.  Left Kidney:  Measures 11 cm, no focal abnormality or hydronephrosis.  Abdominal aorta:  Proximal and midportion is nonaneurysmal, measuring up to 1.7 cm. The distal aorta is obscured by bowel gas.  IMPRESSION:  1.  Negative abdominal ultrasound.  2.  Status post cholecystectomy.   Original Report Authenticated By: Tiburcio Pea   Ct Abdomen Pelvis W Contrast  11/03/2012   *RADIOLOGY REPORT*  Clinical Data: Left-sided abdominal pain.  History of prior gastric bypass surgery.  Nausea and diarrhea.  CT ABDOMEN AND PELVIS WITH CONTRAST  Technique:  Multidetector CT imaging of the abdomen and pelvis was performed following the standard protocol during bolus administration of intravenous contrast.  Contrast: 50mL OMNIPAQUE IOHEXOL 300 MG/ML  SOLN, OMNIPAQUE IOHEXOL 300 MG/ML  SOLN  Comparison: CT of the  abdomen and pelvis 09/18/2012.  Findings:  Lung Bases: Dependent atelectasis in the lower lobes of the lungs bilaterally.  Abdomen/Pelvis:  Status post cholecystectomy.  The appearance of the liver, pancreas, spleen, bilateral adrenal glands and bilateral kidneys is unremarkable.  Postoperative changes of gastric bypass procedure are noted.  No significant volume of ascites.  No pneumoperitoneum.  No pathologic distension of small bowel.  Large volume of stool throughout the colon may suggest constipation. IUD in place within the endometrial canal.  Uterus and ovaries are otherwise unremarkable in appearance.  No definite pathologic lymphadenopathy identified within the abdomen or pelvis. Urinary bladder is normal in appearance.  Musculoskeletal: There are no aggressive appearing lytic or blastic lesions noted in the visualized portions of the skeleton.  IMPRESSION: 1.  No acute findings in the abdomen or pelvis to account for the patient's symptoms. 2.  However, there is a relatively large volume of stool throughout the colon which may suggest constipation. 3.  Status post cholecystectomy. 4.  IUD in position.   Original Report Authenticated By: Trudie Reed, M.D.    All other review of systems negative or noncontributory except as stated in the HPI   Blood pressure 112/75, pulse 80, temperature 97.4 F (36.3 C), temperature source Oral, resp. rate  16, height 5\' 7"  (1.702 m), weight 198 lb (89.812 kg), SpO2 98.00%. General appearance: cooperative and no distress Resp: clear to auscultation bilaterally Cardio: normal rate, regular GI: soft, mild epigastric pain, minimal distension??, no mass, no peritoneal signs  Assessment/Plan: Abdominal pain, marginal ulcer, nausea and vomiting This is a difficult problem. She has chronic abdominal pain as well as a history of a marginal ulcer which on her last endoscopy was not completely healed. She says that she is taking her Protonix but is no longer taking  Carafate. I think that this is most likely ulcer discomfort and I would recommend EGD for further evaluation as well as resuming her Carafate as well as her twice daily Protonix. I do not see any acute surgical cause or need for emergency surgery. Her CT scan is essentially normal and her abdominal exam does not demonstrate any peritonitis. However, she says that she cannot keep food and fluids down so I would recommend that she received IV hydration until her symptoms resolve. If she is admitted, then upper endoscopy may be performed while inpatient.  Lodema Pilot DAVID 11/03/2012, 9:25 AM

## 2012-11-03 NOTE — ED Notes (Signed)
Pt unable to void at this time. 

## 2012-11-04 ENCOUNTER — Telehealth (INDEPENDENT_AMBULATORY_CARE_PROVIDER_SITE_OTHER): Payer: Self-pay

## 2012-11-04 NOTE — Telephone Encounter (Signed)
Called patient to see how she's doing patient reports she's doing okay right now.  Patient given an appointment with Dr. Johna Sheriff for 11/09/12 @ 1:15 pm patient okay with this date & time.

## 2012-11-04 NOTE — ED Provider Notes (Signed)
Medical screening examination/treatment/procedure(s) were performed by non-physician practitioner and as supervising physician I was immediately available for consultation/collaboration.  Raeford Razor, MD 11/04/12 [phone]

## 2012-11-08 NOTE — ED Provider Notes (Signed)
Medical screening examination/treatment/procedure(s) were performed by non-physician practitioner and as supervising physician I was immediately available for consultation/collaboration.  Lilian Fuhs, MD 11/08/12 1515 

## 2012-11-09 ENCOUNTER — Encounter (INDEPENDENT_AMBULATORY_CARE_PROVIDER_SITE_OTHER): Payer: Self-pay | Admitting: General Surgery

## 2012-11-09 ENCOUNTER — Ambulatory Visit (INDEPENDENT_AMBULATORY_CARE_PROVIDER_SITE_OTHER): Payer: BC Managed Care – PPO | Admitting: General Surgery

## 2012-11-09 VITALS — BP 123/72 | HR 68 | Temp 97.7°F | Resp 14 | Ht 67.0 in | Wt 201.8 lb

## 2012-11-09 DIAGNOSIS — K289 Gastrojejunal ulcer, unspecified as acute or chronic, without hemorrhage or perforation: Secondary | ICD-10-CM

## 2012-11-09 DIAGNOSIS — Z9884 Bariatric surgery status: Secondary | ICD-10-CM

## 2012-11-09 DIAGNOSIS — R1012 Left upper quadrant pain: Secondary | ICD-10-CM

## 2012-11-09 NOTE — Patient Instructions (Signed)
We will arrange for Dr. Dulce Sellar to do your upper endoscopy on September 5. Continue Protonix and Carafate.

## 2012-11-09 NOTE — Progress Notes (Signed)
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 She has had a marginal ulcer diagnosed previously with recurrent left upper quadrant abdominal pain. Her last endoscopy in April showed some significant healing though not complete resolution of her ulcer. At her last office visit in June she was feeling significantly better although still having some left upper quadrant pain. Last week she developed what she describes the sudden worsening left upper quadrant pain was quite severe she presented to the emergency room. Extensive workup including CT scan of the abdomen and pelvis, abdominal ultrasound and lab work were essentially unremarkable although she did have moderately elevated transaminases in the mid 200 range with normal bilirubin and alkaline phosphatase. She is status post cholecystectomy. No ductal dilatation. CT scan showed only moderate constipation and no other acute findings. She was discharged after treatment but states she has continued to have very significant left upper quadrant pain for the last week. She has some occasional food intolerance with acid foods or meats but generally has been eating okay. She will occasionally get pain or diarrhea or vomiting with certain foods but this is inconsistent.  Exam: BP 123/72  Pulse 68  Temp(Src) 97.7 F (36.5 C) (Temporal)  Resp 14  Ht 5\' 7"  (1.702 m)  Wt 201 lb 12.8 oz (91.536 kg)  BMI 31.6 kg/m2 Total weight loss 84 pounds, 7 pounds since last visit General: A little fatigued but does not appear acutely old Lymph nodes: No palpable HEENT: No scleral icterus. Oropharynx clear. Lungs: Somewhat decreased breath sounds at the bases but no wheezing or increased work of breathing Abdomen: Some left upper quadrant tenderness without guarding. No masses. No organomegaly.  Assessment and plan: Status post Roux-en-Y gastric bypass. History of marginal ulcer and recurrent left  upper quadrant abdominal pain. This is worse in the past week. ER workup negative as above. As it turns out she has a colonoscopy scheduled with Dr. Dulce Sellar September 5. I've spoken with him and he will go ahead and do a repeat upper endoscopy at that time to evaluate her ulcer. I've also asked him to look into her mildly elevated transaminases. This was all discussed with the patient and she understands and agrees with this plan. Continue Protonix and Carafate for now. I will see her back for a scheduled appointment in about 3 weeks after this evaluation.

## 2012-11-10 ENCOUNTER — Ambulatory Visit (INDEPENDENT_AMBULATORY_CARE_PROVIDER_SITE_OTHER): Payer: BC Managed Care – PPO | Admitting: General Surgery

## 2012-11-29 ENCOUNTER — Encounter: Payer: Self-pay | Admitting: *Deleted

## 2012-11-29 ENCOUNTER — Encounter: Payer: BC Managed Care – PPO | Attending: General Surgery | Admitting: *Deleted

## 2012-11-29 DIAGNOSIS — Z713 Dietary counseling and surveillance: Secondary | ICD-10-CM | POA: Insufficient documentation

## 2012-11-29 DIAGNOSIS — Z01818 Encounter for other preprocedural examination: Secondary | ICD-10-CM | POA: Insufficient documentation

## 2012-11-29 NOTE — Patient Instructions (Addendum)
Goals:  Follow Phase 3B: High Protein + Non-Starchy Vegetables  Increase lean protein foods to meet 60-80g goal  Continue fluid intake of 64oz +  Add 15 grams of carbohydrate (fruit, whole grain, starchy vegetable) with meals  Aim for >30 min of physical activity daily as able

## 2012-11-29 NOTE — Progress Notes (Addendum)
  Follow-up visit:  9 Months Post-Operative RYGB Surgery  Medical Nutrition Therapy:  Appt start time: 1000  End time:  1030.  Primary concerns today:  Post-operative Bariatric Surgery Nutrition Management. Aranda returns today for 1 year f/u. Reports N/V over last 2 days after colonoscopy and EGD (were WNL). Hasn't exercised recently d/t pain from degeneration and pinching in L4-L5 in back. May need surgery; followed by Dr. Shela Commons. Shelle Iron. Eating ~ 12 oz protein daily, no protein shakes. Still  fairly consistent with weekly menus.   Surgery date: 11/30/11 Surgery type: RYGB Start weight @ [facility]: 290.5 lbs (09/04/11)  Weight today: 200.5 lbs Weight change: - 5.5 lb  Total weight loss: 90.0 lbs BMI: 31.4 kg/m^2  Weight loss goal: 175 lbs  TANITA BODY COMP RESULTS   09/04/11  12/15/11  12/29/11 01/26/12 02/25/12 04/07/12 05/04/12 06/06/12 08/29/12 11/28/12  Fat Mass (lbs)  157.0  135.0  133.5 124.0 112.0 98.5 91.5 91.0 87.0 89.0  Fat Free Mass (lbs)  133.5  134.5  122.5 125.5 123.5 117.5 116.0 114.5 119.0 111.5  Total Body Water (lbs)  97.5  98.5  89.5 92.0 90.5 86.0 85.0 84.0 87.0 81.5   Fluid intake:  Decaf iced tea w/ Splenda = 70-95 oz Estimated total protein intake:  2-4 oz protein at meals, 1-2 eggs at breakfast some days = 60g    Medications: Reconciled with pt at visit Supplementation: Taking as directed  CBG monitoring: No longer checking Average CBG per patient:  N/A Last patient reported A1c:  5.1% (Feb 2014)  Lab Results  Component Value Date   HGBA1C 6.3* 03/04/2012   Using straws: No Drinking while eating: Usually not Hair loss: Resolved Carbonated beverages: No N/V/D/C:  N/V last 2 days after having colonoscopy/EGD; Nausea w/ bowl of froot loops Dumping syndrome: None  Recent physical activity:  [employer]. Mows lawn with push mower and walks 2-3 days/week for 20-30 min; Physical Therapy 2x/week on back   Progress Towards Goal(s):  In progress.   Nutritional  Diagnosis:  Selz-3.3 Overweight/obesity related to past poor dietary habits and physical inactivity as evidenced by patient w/ recent RYGB surgery following dietary guidelines for continued weight loss.  Samples given during visit include:   Nascobal Nasal B12 Lot: [id number] Exp: 10/15   BariActiv Multivitamin: 2 pkts Lot: [id number] Exp: 05/16   BariActiv Calcium + D3 & Magnesium: 2 pkts Lot: [id number] Exp: 05/16   BariActiv Iron + Vitamin C: 2 pkts Lot: [id number] Exp: 05/16  Intervention:  Nutrition education/reinforcement.  Monitoring/Evaluation:  Dietary intake, exercise, and body weight. Follow up in 6 weeks for post-op visit.

## 2012-12-01 ENCOUNTER — Ambulatory Visit (INDEPENDENT_AMBULATORY_CARE_PROVIDER_SITE_OTHER): Payer: BC Managed Care – PPO | Admitting: General Surgery

## 2012-12-01 ENCOUNTER — Encounter (INDEPENDENT_AMBULATORY_CARE_PROVIDER_SITE_OTHER): Payer: Self-pay | Admitting: General Surgery

## 2012-12-01 VITALS — BP 118/78 | HR 71 | Temp 97.0°F | Resp 16 | Ht 67.0 in | Wt 200.0 lb

## 2012-12-01 DIAGNOSIS — Z09 Encounter for follow-up examination after completed treatment for conditions other than malignant neoplasm: Secondary | ICD-10-CM

## 2012-12-01 DIAGNOSIS — Z9884 Bariatric surgery status: Secondary | ICD-10-CM

## 2012-12-01 DIAGNOSIS — K912 Postsurgical malabsorption, not elsewhere classified: Secondary | ICD-10-CM

## 2012-12-01 DIAGNOSIS — K289 Gastrojejunal ulcer, unspecified as acute or chronic, without hemorrhage or perforation: Secondary | ICD-10-CM

## 2012-12-01 LAB — IRON AND TIBC: %SAT: 18 % — ABNORMAL LOW (ref 20–55)

## 2012-12-01 NOTE — Progress Notes (Signed)
Chief complaint: Followup Roux-en-Y gastric bypass  History: Patient returns for routine followup now 1 year following Laparoscopic Roux-en-Y gastric bypass Preoperative comorbidities of insulin dependent diabetes mellitus and obstructive sleep apnea. She has had a lot of issues with recurrent left upper quadrant abdominal pain over the past year. She was diagnosed with a marginal ulcer and has been on Protonix and Carafate although does not really tolerate the Carafate well. She recently underwent colonoscopy and upper endoscopy by Dr. Dulce Sellar. These were both negative and happily upper endoscopy showed that her marginal ulcer has healed. She continues however to have some left upper quadrant abdominal pain with eating occasionally associated with nausea. On questioning though it seems that the pain is gradually improving. She actually seems much more upbeat and comfortable at her office visit today than she has in some time. She is still having quite a bit of back pain which is chronic and is followed closely for this.  Past Medical History  Diagnosis Date  . Depression   . Anxiety   . Diabetes mellitus   . GERD (gastroesophageal reflux disease)   . ADD (attention deficit disorder)   . Hyperlipemia   . Herniated disc   . Allergy     Seasonal  . Diabetic neuropathy   . Shortness of breath     with exertion   . Sleep apnea     obsructive settings at 13   . Heart murmur     asa child  . Seizures     hx of / month ago - dehydration   . 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 Degenerative disc disease   . Annular disc tear   . Retrolisthesis     L4 and L5  . Bulging disc    Past Surgical History  Procedure Laterality Date  . Cholecystectomy    . Cesarean section    . Tonsillectomy    . Laporoscopy  2007  . Breath tek h pylori  09/11/2011    Procedure: BREATH TEK H PYLORI;  Surgeon: Mariella Saa, MD;  Location: [facility];   Service: General;  Laterality: N/A;  . 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 Bowel resection  11/30/2011    Procedure: SMALL BOWEL RESECTION;  Surgeon: Mariella Saa, MD;  Location: [facility];  Service: General;;  . Esophagogastroduodenoscopy  03/14/2012    Procedure: ESOPHAGOGASTRODUODENOSCOPY (EGD);  Surgeon: Kandis Cocking, MD;  Location: [facility];  Service: General;  Laterality: N/A;  . Esophagogastroduodenoscopy  04/25/2012    Procedure: ESOPHAGOGASTRODUODENOSCOPY (EGD);  Surgeon: Kandis Cocking, MD;  Location: [facility];  Service: General;  Laterality: N/A;   Current Outpatient Prescriptions  Medication Sig Dispense Refill  . ALPRAZolam (XANAX) 1 MG tablet Take 1-2 mg by mouth 4 (four) times daily as needed for anxiety. For anxiety      . Cholecalciferol (VITAMIN D-3 PO) Take 1 tablet by mouth daily.      . promethazine (PHENERGAN) 25 MG/ML injection INJECT 2 MLS (50 MG TOTAL) INTO THE MUSCLE AS NEEDED.      Marland Kitchen cyclobenzaprine (FLEXERIL) 10 MG tablet Take 10 mg by mouth 3 (three) times daily as needed.       . cyproheptadine (PERIACTIN) 2 MG/5ML syrup at bedtime. Take 54mls(8mg  total) at bedtime      .  docusate sodium 100 MG CAPS Take 100 mg by mouth 2 (two) times daily.  60 capsule  0  . doxepin (SINEQUAN) 50 MG capsule Take 50 mg by mouth 3 (three) times daily.      Marland Kitchen HYDROmorphone (DILAUDID) 4 MG tablet Take 4 mg by mouth 3 (three) times daily as needed for pain.      Marland Kitchen ketoconazole (NIZORAL) 2 % shampoo Apply 1 application topically daily as needed. For scalp itching/pain.      . Methylphenidate HCl ER (QUILLIVANT XR) 25 MG/5ML SUSR Take 12 mLs by mouth daily.      . Multiple Vitamins-Minerals (MULTIVITAMIN PO) Take 1 tablet by mouth 2 (two) times daily.      Marland Kitchen OLANZapine (ZYPREXA) 2.5 MG tablet Take 2.5 mg by mouth at bedtime. Take up to TID for  hemiplegic migraines      . OnabotulinumtoxinA (BOTOX IJ) Inject as directed every 3 (three) months.      . pantoprazole (PROTONIX) 40 MG tablet Take 1 tablet (40 mg total) by mouth daily.  60 tablet  2  . PROMETHAZINE HCL IJ Inject 50 mLs as directed every 4 (four) hours as needed (for N/V with hemiplegic migraines).      . promethazine-phenylephrine (PROMETHAZINE VC) 6.25-5 MG/5ML SYRP Take 20 mLs by mouth every 4 (four) hours as needed (N/V with hemiplegic migraines).      . ranitidine (ZANTAC) 150 MG tablet Take 150 mg by mouth 2 (two) times daily.      Marland Kitchen senna (SENOKOT) 8.6 MG tablet Take 1 tablet by mouth daily.      . vitamin B-12 (CYANOCOBALAMIN) 1000 MCG tablet Take 2,000 mcg by mouth daily.       No current facility-administered medications for this visit.   Exam: BP 118/78  Pulse 71  Temp(Src) 97 F (36.1 C) (Temporal)  Resp 16  Ht 5\' 7"  (1.702 m)  Wt 200 lb (90.719 kg)  BMI 31.32 kg/m2 Total weight loss 86 pounds, 1 pound since last visit General: No distress Skin: No rash or infection Lymph nodes: No palpable Lungs: Clear breath sounds bilaterally Cardiac: Regular rate and rhythm no edema Abdomen: Minimal epigastric tenderness. Wounds well healed. No hernias. No masses.  Assessment and plan: 1 year following laparoscopic Roux-en-Y gastric bypass. Complicated by a marginal ulcer that appears to completely healed. She still has some left upper quadrant pain. Overall the course of this seems to be improving. She will stop her Carafate and continue Protonix daily. Her diabetes and obstructive sleep apnea are in remission. She is encouraged to get as much exercise as possible with what her back we'll allow her. We'll check vitamin and iron levels today and I will call her with the results. Return in 6 months or sooner if she is having worsening abdominal pain. The only other diagnostic step for this would be laparoscopy which I think would be low yield and will hold off on this  since she seems to be improving.

## 2012-12-05 LAB — VITAMIN B1: Vitamin B1 (Thiamine): 17 nmol/L (ref 8–30)

## 2012-12-07 ENCOUNTER — Emergency Department (HOSPITAL_COMMUNITY): Payer: BC Managed Care – PPO

## 2012-12-07 ENCOUNTER — Observation Stay (HOSPITAL_COMMUNITY)
Admission: EM | Admit: 2012-12-07 | Discharge: 2012-12-09 | Disposition: A | Discharge status: Home / Self Care | Payer: BC Managed Care – PPO | Attending: Family Medicine | Admitting: Family Medicine

## 2012-12-07 ENCOUNTER — Encounter (HOSPITAL_COMMUNITY): Payer: Self-pay | Admitting: Emergency Medicine

## 2012-12-07 DIAGNOSIS — E119 Type 2 diabetes mellitus without complications: Secondary | ICD-10-CM

## 2012-12-07 DIAGNOSIS — Z09 Encounter for follow-up examination after completed treatment for conditions other than malignant neoplasm: Secondary | ICD-10-CM

## 2012-12-07 DIAGNOSIS — R51 Headache: Secondary | ICD-10-CM | POA: Insufficient documentation

## 2012-12-07 DIAGNOSIS — G43909 Migraine, unspecified, not intractable, without status migrainosus: Secondary | ICD-10-CM | POA: Diagnosis not present

## 2012-12-07 DIAGNOSIS — G8194 Hemiplegia, unspecified affecting left nondominant side: Secondary | ICD-10-CM

## 2012-12-07 DIAGNOSIS — K912 Postsurgical malabsorption, not elsewhere classified: Secondary | ICD-10-CM

## 2012-12-07 DIAGNOSIS — F1320 Sedative, hypnotic or anxiolytic dependence, uncomplicated: Secondary | ICD-10-CM | POA: Insufficient documentation

## 2012-12-07 DIAGNOSIS — R1012 Left upper quadrant pain: Secondary | ICD-10-CM

## 2012-12-07 DIAGNOSIS — G43409 Hemiplegic migraine, not intractable, without status migrainosus: Secondary | ICD-10-CM

## 2012-12-07 DIAGNOSIS — IMO0001 Reserved for inherently not codable concepts without codable children: Secondary | ICD-10-CM

## 2012-12-07 DIAGNOSIS — Z87898 Personal history of other specified conditions: Secondary | ICD-10-CM | POA: Insufficient documentation

## 2012-12-07 DIAGNOSIS — Z8669 Personal history of other diseases of the nervous system and sense organs: Secondary | ICD-10-CM

## 2012-12-07 DIAGNOSIS — R197 Diarrhea, unspecified: Secondary | ICD-10-CM

## 2012-12-07 DIAGNOSIS — G4733 Obstructive sleep apnea (adult) (pediatric): Secondary | ICD-10-CM

## 2012-12-07 DIAGNOSIS — Z9884 Bariatric surgery status: Secondary | ICD-10-CM

## 2012-12-07 DIAGNOSIS — F449 Dissociative and conversion disorder, unspecified: Secondary | ICD-10-CM | POA: Diagnosis not present

## 2012-12-07 DIAGNOSIS — E1165 Type 2 diabetes mellitus with hyperglycemia: Secondary | ICD-10-CM | POA: Diagnosis present

## 2012-12-07 DIAGNOSIS — K289 Gastrojejunal ulcer, unspecified as acute or chronic, without hemorrhage or perforation: Secondary | ICD-10-CM

## 2012-12-07 DIAGNOSIS — Z79899 Other long term (current) drug therapy: Secondary | ICD-10-CM | POA: Insufficient documentation

## 2012-12-07 DIAGNOSIS — IMO0002 Reserved for concepts with insufficient information to code with codable children: Secondary | ICD-10-CM

## 2012-12-07 DIAGNOSIS — G9340 Encephalopathy, unspecified: Secondary | ICD-10-CM

## 2012-12-07 DIAGNOSIS — F1920 Other psychoactive substance dependence, uncomplicated: Secondary | ICD-10-CM | POA: Insufficient documentation

## 2012-12-07 LAB — PROTIME-INR
INR: 1.02 (ref 0.00–1.49)
Prothrombin Time: 13.2 seconds (ref 11.6–15.2)

## 2012-12-07 LAB — POCT I-STAT, CHEM 8
BUN: 9 mg/dL (ref 6–23)
Creatinine, Ser: 0.8 mg/dL (ref 0.50–1.10)
Potassium: 4.8 mEq/L (ref 3.5–5.1)
Sodium: 136 mEq/L (ref 135–145)
TCO2: 26 mmol/L (ref 0–100)

## 2012-12-07 LAB — COMPREHENSIVE METABOLIC PANEL
AST: 19 U/L (ref 0–37)
Albumin: 4.2 g/dL (ref 3.5–5.2)
Alkaline Phosphatase: 83 U/L (ref 39–117)
Chloride: 96 mEq/L (ref 96–112)
Potassium: 4.8 mEq/L (ref 3.5–5.1)
Sodium: 134 mEq/L — ABNORMAL LOW (ref 135–145)
Total Bilirubin: 0.4 mg/dL (ref 0.3–1.2)
Total Protein: 7.4 g/dL (ref 6.0–8.3)

## 2012-12-07 LAB — URINALYSIS, ROUTINE W REFLEX MICROSCOPIC
Glucose, UA: NEGATIVE mg/dL
Hgb urine dipstick: NEGATIVE
Ketones, ur: NEGATIVE mg/dL
Protein, ur: NEGATIVE mg/dL
Urobilinogen, UA: 0.2 mg/dL (ref 0.0–1.0)

## 2012-12-07 LAB — CBC
Hemoglobin: 14.1 g/dL (ref 12.0–15.0)
MCHC: 33.7 g/dL (ref 30.0–36.0)
Platelets: 239 10*3/uL (ref 150–400)
RDW: 13.5 % (ref 11.5–15.5)

## 2012-12-07 LAB — RAPID URINE DRUG SCREEN, HOSP PERFORMED
Amphetamines: NOT DETECTED
Benzodiazepines: POSITIVE — AB
Tetrahydrocannabinol: NOT DETECTED

## 2012-12-07 LAB — DIFFERENTIAL
Basophils Absolute: 0 10*3/uL (ref 0.0–0.1)
Basophils Relative: 0 % (ref 0–1)
Eosinophils Absolute: 0.1 10*3/uL (ref 0.0–0.7)
Neutro Abs: 10.7 10*3/uL — ABNORMAL HIGH (ref 1.7–7.7)
Neutrophils Relative %: 84 % — ABNORMAL HIGH (ref 43–77)

## 2012-12-07 LAB — GLUCOSE, CAPILLARY: Glucose-Capillary: 111 mg/dL — ABNORMAL HIGH (ref 70–99)

## 2012-12-07 LAB — APTT: aPTT: 27 seconds (ref 24–37)

## 2012-12-07 LAB — POCT I-STAT TROPONIN I

## 2012-12-07 LAB — ETHANOL: Alcohol, Ethyl (B): <11 mg/dL (ref 0–11)

## 2012-12-07 MED ORDER — DOXEPIN HCL 75 MG PO CAPS
150.0000 mg | ORAL_CAPSULE | Freq: Every day | ORAL | Status: DC
  Administered 2012-12-08: 150 mg via ORAL
  Filled 2012-12-07 (×3): qty 2

## 2012-12-07 MED ORDER — ALPRAZOLAM 0.25 MG PO TABS: 1.0000 mg | ORAL_TABLET | Freq: Four times a day (QID) | ORAL | Status: DC | PRN

## 2012-12-07 MED ORDER — VALPROATE SODIUM 500 MG/5ML IV SOLN
500.0000 mg | Freq: Once | INTRAVENOUS | Status: AC
  Administered 2012-12-07: 500 mg via INTRAVENOUS
  Filled 2012-12-07: qty 5

## 2012-12-07 MED ORDER — VITAMIN B-12 1000 MCG PO TABS
2500.0000 ug | ORAL_TABLET | Freq: Every day | ORAL | Status: DC
  Administered 2012-12-08: 2500 ug via ORAL
  Filled 2012-12-07 (×2): qty 1

## 2012-12-07 MED ORDER — DOXEPIN HCL 50 MG PO CAPS
50.0000 mg | ORAL_CAPSULE | Freq: Three times a day (TID) | ORAL | Status: DC
Start: 2012-12-08 — End: 2012-12-09
  Administered 2012-12-08: 50 mg via ORAL
  Filled 2012-12-07 (×6): qty 1

## 2012-12-07 MED ORDER — OLANZAPINE 2.5 MG PO TABS
2.5000 mg | ORAL_TABLET | Freq: Three times a day (TID) | ORAL | Status: DC | PRN
  Filled 2012-12-07: qty 1

## 2012-12-07 MED ORDER — METHYLPHENIDATE HCL ER 25 MG/5ML PO SUSR: 12.0000 mL | Freq: Every day | ORAL | Status: DC

## 2012-12-07 MED ORDER — LEVOCETIRIZINE DIHYDROCHLORIDE 5 MG PO TABS: 5.0000 mg | ORAL_TABLET | Freq: Every evening | ORAL | Status: DC

## 2012-12-07 MED ORDER — PROMETHAZINE HCL 25 MG/ML IJ SOLN
50.0000 mg | Freq: Once | INTRAMUSCULAR | Status: AC
  Administered 2012-12-08: 50 mg via INTRAMUSCULAR
  Filled 2012-12-07: qty 2

## 2012-12-07 MED ORDER — DOCUSATE SODIUM 100 MG PO CAPS
100.0000 mg | ORAL_CAPSULE | Freq: Two times a day (BID) | ORAL | Status: DC
  Administered 2012-12-08 (×2): 100 mg via ORAL
  Filled 2012-12-07 (×2): qty 1

## 2012-12-07 MED ORDER — HYDROMORPHONE HCL PF 1 MG/ML IJ SOLN
1.0000 mg | Freq: Once | INTRAMUSCULAR | Status: AC
  Administered 2012-12-08: 1 mg via INTRAVENOUS
  Filled 2012-12-07: qty 1

## 2012-12-07 MED ORDER — CYPROHEPTADINE HCL 2 MG/5ML PO SYRP
8.0000 mg | ORAL_SOLUTION | Freq: Every day | ORAL | Status: DC
Start: 2012-12-08 — End: 2012-12-08
  Filled 2012-12-07 (×2): qty 20

## 2012-12-07 MED ORDER — HYDROMORPHONE HCL 2 MG PO TABS
4.0000 mg | ORAL_TABLET | Freq: Three times a day (TID) | ORAL | Status: DC | PRN
  Administered 2012-12-08 (×3): 4 mg via ORAL
  Filled 2012-12-07 (×3): qty 2

## 2012-12-07 MED ORDER — HYDROMORPHONE HCL PF 1 MG/ML IJ SOLN
1.0000 mg | Freq: Once | INTRAMUSCULAR | Status: AC
  Administered 2012-12-07: 1 mg via INTRAVENOUS
  Filled 2012-12-07: qty 1

## 2012-12-07 MED ORDER — ADULT MULTIVITAMIN W/MINERALS CH
2.0000 | ORAL_TABLET | Freq: Every day | ORAL | Status: DC
  Administered 2012-12-08: 2 via ORAL
  Filled 2012-12-07 (×2): qty 2

## 2012-12-07 MED ORDER — PROMETHAZINE-PHENYLEPHRINE 6.25-5 MG/5ML PO SYRP
20.0000 mL | ORAL_SOLUTION | ORAL | Status: DC | PRN
  Filled 2012-12-07: qty 20

## 2012-12-07 MED ORDER — FERROUS SULFATE 325 (65 FE) MG PO TABS
325.0000 mg | ORAL_TABLET | Freq: Every day | ORAL | Status: DC
  Filled 2012-12-07 (×3): qty 1

## 2012-12-07 MED ORDER — OLANZAPINE 2.5 MG PO TABS
2.5000 mg | ORAL_TABLET | Freq: Every day | ORAL | Status: DC
  Administered 2012-12-08: 2.5 mg via ORAL
  Filled 2012-12-07 (×2): qty 1

## 2012-12-07 MED ORDER — FAMOTIDINE 20 MG PO TABS
20.0000 mg | ORAL_TABLET | Freq: Two times a day (BID) | ORAL | Status: DC
  Filled 2012-12-07 (×4): qty 1

## 2012-12-07 MED ORDER — METHYLPHENIDATE HCL ER (LA) 10 MG PO CP24: 60.0000 mg | ORAL_CAPSULE | Freq: Every day | ORAL | Status: DC

## 2012-12-07 MED ORDER — VITAMIN B-12 2500 MCG SL SUBL
2500.0000 ug | SUBLINGUAL_TABLET | Freq: Every day | SUBLINGUAL | Status: DC
Start: 2012-12-08 — End: 2012-12-07

## 2012-12-07 MED ORDER — VITAMIN D 1000 UNITS PO TABS
2000.0000 [IU] | ORAL_TABLET | Freq: Every day | ORAL | Status: DC
  Administered 2012-12-08 – 2012-12-09 (×3): 2000 [IU] via ORAL
  Filled 2012-12-07 (×3): qty 2

## 2012-12-07 MED ORDER — CLOBETASOL PROPIONATE 0.05 % EX SOLN: 1.0000 "application " | Freq: Two times a day (BID) | CUTANEOUS | Status: DC | PRN

## 2012-12-07 MED ORDER — FERROUS SULFATE 325 (65 FE) MG PO TBEC: 325.0000 mg | DELAYED_RELEASE_TABLET | Freq: Every day | ORAL | Status: DC

## 2012-12-07 MED ORDER — CYCLOBENZAPRINE HCL 10 MG PO TABS: 10.0000 mg | ORAL_TABLET | Freq: Three times a day (TID) | ORAL | Status: DC | PRN

## 2012-12-07 MED ORDER — KETOCONAZOLE 2 % EX SHAM: 1.0000 "application " | MEDICATED_SHAMPOO | Freq: Every day | CUTANEOUS | Status: DC | PRN

## 2012-12-07 MED ORDER — PANTOPRAZOLE SODIUM 40 MG PO TBEC
40.0000 mg | DELAYED_RELEASE_TABLET | Freq: Every day | ORAL | Status: DC
  Administered 2012-12-08 – 2012-12-09 (×2): 40 mg via ORAL
  Filled 2012-12-07 (×2): qty 1

## 2012-12-07 MED ORDER — HEPARIN SODIUM (PORCINE) 5000 UNIT/ML IJ SOLN
5000.0000 [IU] | Freq: Three times a day (TID) | INTRAMUSCULAR | Status: DC
  Administered 2012-12-08 – 2012-12-09 (×5): 5000 [IU] via SUBCUTANEOUS
  Filled 2012-12-07 (×7): qty 1

## 2012-12-07 MED ORDER — SENNOSIDES 8.6 MG PO TABS
1.0000 | ORAL_TABLET | Freq: Every day | ORAL | Status: DC
  Administered 2012-12-08: 8.6 mg via ORAL
  Filled 2012-12-07 (×2): qty 1

## 2012-12-07 MED ORDER — PROMETHAZINE HCL 25 MG/ML IJ SOLN
25.0000 mg | Freq: Once | INTRAMUSCULAR | Status: AC
  Administered 2012-12-07: 25 mg via INTRAVENOUS
  Filled 2012-12-07: qty 1

## 2012-12-07 MED ORDER — LORATADINE 10 MG PO TABS
10.0000 mg | ORAL_TABLET | Freq: Every day | ORAL | Status: DC
  Filled 2012-12-07 (×2): qty 1

## 2012-12-07 NOTE — ED Notes (Addendum)
Pt states she was on her way to pick up her daughter early this afternoon when she first noticed her left sided deficits, pt states the weakness progressively increased very quickly. Pt c/o a terrible migraine, cold compress applied to head and lights turned out. Pt also c/o a severe back pain, pt states she has chronic back pain, pt states she has a disc problem in her back and needs to have surgery.

## 2012-12-07 NOTE — ED Provider Notes (Signed)
Patient with history of common migraines MRI negative. Patient still with left-sided weakness still having headache pain. Patient will need to be admitted unable to discharge home with some left-sided weakness. MRI ruled out any significant clinical pathology patient has had this happen in the past. Patient is followed by Dr. Hyacinth Meeker from [facility].  Shelda Jakes, MD 12/07/12 [phone]

## 2012-12-07 NOTE — H&P (Signed)
[facility] History and Physical  [name] ZOX:[id number] DOB: 09-05-1975 DOA: 12/07/2012  Referring physician: ED PCP: Neldon Labella, MD   Chief Complaint: Hemiplegic migraine  HPI: [name] is a 37 y.o. female who presents to the emergency room and code stroke status following development of left-sided numbness and weakness with associated headache. Patient was last known well at around 10 AM today. CT scan of her head showed no acute intracranial abnormality. Patient has had multiple presentations with similar complaints. Imaging studies including MRI have been negative. Patient reportedly took 4 mg of Dilaudid for headache earlier and has been lethargic. Exam findings were unreliable. NIH stroke score was 10. MRI study of her brain showed no acute intracranial abnormalities. Code stroke was canceled, as patient had no objective deficits, along with her history of complicated migraine headaches with resolution of deficits without specific treatment intervention.   Upon further discussion with the patient's husband first by Dr. Hyacinth Meeker in the ED and then by myself, numerous red flags are being raised regarding the patients use / overuse of prescription narcotics and benzodiazepines.  Specifically she has had an admission recently for apparent overdose with encephalopathy believed to be due to this, she has also had episodes at home, where the husband states he had to pick her up unconscious off of the floor.  Additionally despite filling a script of dilaudid yesterday, over half the pills are missing although this last could be due to her putting them in a different location as she states she does.  Patient's husband has also become concerned with her driving ability while on these medications.  There have been family discussions about getting the patient help with regards to these issues.  Review of Systems: 12 systems reviewed and otherwise negative.  Past Medical History   Diagnosis Date  . Depression   . Anxiety   . Diabetes mellitus   . GERD (gastroesophageal reflux disease)   . ADD (attention deficit disorder)   . Hyperlipemia   . Herniated disc   . Allergy     Seasonal  . Diabetic neuropathy   . Shortness of breath     with exertion   . Sleep apnea     obsructive settings at 13   . Heart murmur     asa child  . Seizures     hx of / month ago - dehydration   . 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 Degenerative disc disease   . Annular disc tear   . Retrolisthesis     L4 and L5  . Bulging disc    Past Surgical History  Procedure Laterality Date  . Cholecystectomy    . Cesarean section    . Tonsillectomy    . Laporoscopy  2007  . Breath tek h pylori  09/11/2011    Procedure: BREATH TEK H PYLORI;  Surgeon: Mariella Saa, MD;  Location: [facility];  Service: General;  Laterality: N/A;  . 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 Bowel resection  11/30/2011    Procedure: SMALL BOWEL RESECTION;  Surgeon: Mariella Saa, MD;  Location: [facility];  Service: General;;  . Esophagogastroduodenoscopy  03/14/2012    Procedure: ESOPHAGOGASTRODUODENOSCOPY (EGD);  Surgeon: Kandis Cocking, MD;  Location: [facility];  Service:  General;  Laterality: N/A;  . Esophagogastroduodenoscopy  04/25/2012    Procedure: ESOPHAGOGASTRODUODENOSCOPY (EGD);  Surgeon: Kandis Cocking, MD;  Location: [facility];  Service: General;  Laterality: N/A;   Social History:  reports that she has never smoked. She has never used smokeless tobacco. She reports that she does not drink alcohol or use illicit drugs.   Allergies  Allergen Reactions  . Atarax [Hydroxyzine Hcl] Anaphylaxis  . Cephalosporins Anaphylaxis  . Codeine Anaphylaxis    Tolerates Dilaudid and Percocet.  . Compazine  Anaphylaxis    Tolerates Phenergan.  . Decadron [Dexamethasone] Anaphylaxis  . Demerol Anaphylaxis  . Metoclopramide Hcl Anaphylaxis  . Morphine And Related Anaphylaxis    Tolerates Dilaudid and Percocet.  . Other Other (See Comments)    ALL STEROIDS CAUSES ULCERS.   Marland Kitchen Penicillins Anaphylaxis  . Sulfa Antibiotics Anaphylaxis  . Triptans Anaphylaxis    Makes migraines worse also  . Zofran Anaphylaxis  . Ace Inhibitors Nausea Only    Spaces out and triggers migraines  . Depo-Medrol [Methylprednisolone]     Pt is s/p RYGB; steroids increase risk of ulcers in these pts.  . Dihydroergotamine   . Hydroxyzine   . Meperidine   . Metoclopramide   . Pork-Derived Products Other (See Comments)    Religous reasons--per pt ok to take Heparin when medically necessary  . Rizatriptan   . Shellfish-Derived Products Other (See Comments)    Religous reasons  . Sumatriptan   . Zanaflex [Tizanidine Hcl] Other (See Comments)    Pt reports "lost balance and light headedness - felt spacy"  . Prozac [Fluoxetine Hcl] Other (See Comments)    Pt reports severe negative mood altering after taking.     Family History  Problem Relation Age of Onset  . Diabetes type II    . Diabetes Mother   . Hypertension Mother   . Depression Brother   . Cancer Maternal Aunt     ovarian   . Depression Maternal Aunt   . Diabetes Maternal Aunt   . Cancer Maternal Grandmother     Lung  . COPD Maternal Grandmother   . Cancer Maternal Grandfather     Prostate  . Diabetes Maternal Grandfather   . Hearing loss Maternal Grandfather   . Hypertension Maternal Grandfather   . Stroke Maternal Grandfather   . Cancer Paternal Grandmother     Colon    Prior to Admission medications   Medication Sig Start Date End Date Taking? Authorizing Provider  ALPRAZolam Prudy Feeler) 1 MG tablet Take 1-2 mg by mouth 4 (four) times daily as needed for anxiety. For anxiety   Yes Rupinder Stevan Born, MD  BIOTIN PO Take 500 mcg by mouth  daily.   Yes Historical Provider, MD  CALCIUM-VITAMIN D PO Take 3 tablets by mouth daily.   Yes Historical Provider, MD  Cholecalciferol (VITAMIN D) 2000 UNITS tablet Take 2,000 Units by mouth daily.   Yes Historical Provider, MD  clobetasol (TEMOVATE) 0.05 % external solution Apply 1 application topically 2 (two) times daily as needed (for itchy scalp).   Yes Historical Provider, MD  Cyanocobalamin (VITAMIN B-12) 2500 MCG SUBL Place 2,500 mcg under the tongue daily.   Yes Historical Provider, MD  cyclobenzaprine (FLEXERIL) 10 MG tablet Take 10 mg by mouth 3 (three) times daily as needed for muscle spasms.   Yes Historical Provider, MD  doxepin (SINEQUAN) 50 MG capsule Take 150 mg by mouth at bedtime.   Yes Historical Provider, MD  ferrous  sulfate 325 (65 FE) MG EC tablet Take 325 mg by mouth daily with breakfast.   Yes Historical Provider, MD  HYDROmorphone (DILAUDID) 4 MG tablet Take 4 mg by mouth 3 (three) times daily as needed for pain.   Yes Historical Provider, MD  levocetirizine (XYZAL) 5 MG tablet Take 5 mg by mouth every evening.   Yes Historical Provider, MD  Multiple Vitamin (MULTIVITAMIN WITH MINERALS) TABS tablet Take 2 tablets by mouth daily.   Yes Historical Provider, MD  OLANZapine (ZYPREXA) 2.5 MG tablet Take 2.5 mg by mouth at bedtime. Take up to TID for hemiplegic migraines   Yes Historical Provider, MD  OLANZapine (ZYPREXA) 2.5 MG tablet Take 2.5 mg by mouth 3 (three) times daily as needed (for hemiplegic migraines).   Yes Historical Provider, MD  OnabotulinumtoxinA (BOTOX IJ) Inject as directed every 3 (three) months.   Yes Annia Belt, MD  pantoprazole (PROTONIX) 40 MG tablet Take 40 mg by mouth 2 (two) times daily. Takes at noon and 4pm daily   Yes Historical Provider, MD  PROMETHAZINE HCL IJ Inject 50 mLs as directed every 4 (four) hours as needed (for N/V with hemiplegic migraines).   Yes Annia Belt, MD  ranitidine (ZANTAC) 150 MG tablet Take 150 mg by mouth 2 (two)  times daily.   Yes Historical Provider, MD  Cholecalciferol (VITAMIN D-3 PO) Take 1 tablet by mouth daily.    Historical Provider, MD  cyclobenzaprine (FLEXERIL) 10 MG tablet Take 10 mg by mouth 3 (three) times daily as needed.  10/27/12   Historical Provider, MD  cyproheptadine (PERIACTIN) 2 MG/5ML syrup at bedtime. Take 39mls(8mg  total) at bedtime 08/18/12   Historical Provider, MD  docusate sodium 100 MG CAPS Take 100 mg by mouth 2 (two) times daily. 10/05/12   Laveda Norman, MD  doxepin (SINEQUAN) 50 MG capsule Take 50 mg by mouth 3 (three) times daily.    Historical Provider, MD  ketoconazole (NIZORAL) 2 % shampoo Apply 1 application topically daily as needed. For scalp itching/pain. 08/11/11   Historical Provider, MD  Methylphenidate HCl ER (QUILLIVANT XR) 25 MG/5ML SUSR Take 12 mLs by mouth daily.    Historical Provider, MD  Multiple Vitamins-Minerals (MULTIVITAMIN PO) Take 1 tablet by mouth 2 (two) times daily.    Historical Provider, MD  pantoprazole (PROTONIX) 40 MG tablet Take 1 tablet (40 mg total) by mouth daily. 10/05/12   Mariella Saa, MD  promethazine (PHENERGAN) 25 MG/ML injection Inject 50 mg into the muscle once. For hemiplegic migraines 11/23/12   Historical Provider, MD  promethazine-phenylephrine (PROMETHAZINE VC) 6.25-5 MG/5ML SYRP Take 20 mLs by mouth every 4 (four) hours as needed (N/V with hemiplegic migraines).    Annia Belt, MD  ranitidine (ZANTAC) 150 MG tablet Take 150 mg by mouth 2 (two) times daily.    Historical Provider, MD  senna (SENOKOT) 8.6 MG tablet Take 1 tablet by mouth daily.    Historical Provider, MD  vitamin B-12 (CYANOCOBALAMIN) 1000 MCG tablet Take 2,000 mcg by mouth daily.    Historical Provider, MD   Physical Exam: Filed Vitals:   12/07/12 2124  BP: 113/65  Pulse: 97  Temp: 99.3 F (37.4 C)  Resp: 18    General:  NAD, resting comfortably in bed Eyes: PEERLA EOMI ENT: mucous membranes moist Neck: supple w/o JVD Cardiovascular: RRR w/o  MRG Respiratory: CTA B Abdomen: soft, nt, nd, bs+ Skin: no rash nor lesion Musculoskeletal: MAE, full ROM all 4 extremities Psychiatric: normal tone and  affect Neurologic: AAOx3, poor effort on left side  Labs on Admission:  Basic Metabolic Panel:  Recent Labs Lab 12/07/12 1452 12/07/12 1643  NA 134* 136  K 4.8 4.8  CL 96 100  CO2 26  --   GLUCOSE 108* 110*  BUN 10 9  CREATININE 0.55 0.80  CALCIUM 9.2  --    Liver Function Tests:  Recent Labs Lab 12/07/12 1452  AST 19  ALT 18  ALKPHOS 83  BILITOT 0.4  PROT 7.4  ALBUMIN 4.2   No results found for this basename: LIPASE, AMYLASE,  in the last 168 hours No results found for this basename: AMMONIA,  in the last 168 hours CBC:  Recent Labs Lab 12/07/12 1452 12/07/12 1643  WBC 12.7*  --   NEUTROABS 10.7*  --   HGB 14.1 15.3*  HCT 41.8 45.0  MCV 82.3  --   PLT 239  --    Cardiac Enzymes:  Recent Labs Lab 12/07/12 1402  TROPONINI <0.30    BNP (last 3 results) No results found for this basename: PROBNP,  in the last 8760 hours CBG:  Recent Labs Lab 12/07/12 2016  GLUCAP 111*    Radiological Exams on Admission: Ct Head Wo Contrast  12/07/2012   *RADIOLOGY REPORT*  Clinical Data: Code stroke.  Sudden onset of left-sided weakness and slurred speech.  CT HEAD WITHOUT CONTRAST  Technique:  Contiguous axial images were obtained from the base of the skull through the vertex without contrast.  Comparison: 10/03/2012  Findings: There is no acute intracranial hemorrhage, infarction, or mass lesion.  Brain parenchyma is normal.  The osseous structures are normal.  IMPRESSION: Normal exam.   Original Report Authenticated By: Francene Boyers, M.D.   Mr Brain Ltd W/o Cm  12/07/2012   *RADIOLOGY REPORT*  Clinical Data: Left-sided weakness.  MRI HEAD WITHOUT CONTRAST  Technique:  Multiplanar, multiecho pulse sequences of the brain and surrounding structures were obtained according to standard protocol without intravenous  contrast.  Comparison: 12/07/2012 CT head.  08/02/2012 MR.  Findings: Examination limited to a diffusion sequence as per request.  No acute infarct.  IMPRESSION: Limited exam without evidence of acute infarct.   Original Report Authenticated By: Lacy Duverney, M.D.    EKG: Independently reviewed.  Assessment/Plan Principal Problem:   Headache, hemiplegic migraine Active Problems:   Conversion disorder   Diabetes type 2, uncontrolled   1. Conversion disorder - Neurology has evaluated the patient, please see their note, no reason to believe the patient is having an ischemic stroke at this time. 2. Chronic narcotic and benzo use - do not wish to increase narcotic dose at this time, will leave patient on her home medications and doses of xanax and po dilaudid for now in hospital, but numerous red flags have been raised by family members including spouse regarding use / abuse of these prescription medications (please see HPI for details).  Worse, I am very concerned given the now reported multiple episodes of LOC, including an encephalopathy admission in July that patient is at risk for bad outcome or even death from accidental overdose. 3. 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 [facility] Pager [phone]  If 7PM-7AM, please contact night-coverage [email or web address] Password [facility] 12/07/2012, 10:38 PM

## 2012-12-07 NOTE — ED Notes (Signed)
Pt's depacon not infusing at this time, infusion stopped in charting.

## 2012-12-07 NOTE — Consult Note (Signed)
Referring Physician: Dr. Eber Hong    Chief Complaint: Headache with left-sided numbness and weakness.  HPI: [name] is an 37 y.o. female with a history of complicated migraines, hyperlipidemia, diabetes mellitus, depression and anxiety, brought to the emergency room and code stroke status following development of left-sided numbness and weakness with associated headache. Patient was last known well at around 10 AM today. CT scan of her head showed no acute intracranial abnormality. Patient has had multiple presentations with similar complaints. Imaging studies including MRI have been negative. Patient reportedly took 4 mg of Dilaudid for headache earlier and has been lethargic. Exam findings were unreliable. NIH stroke score was 10. MRI study of her brain showed no acute intracranial abnormalities. Code stroke was canceled, as patient had no objective deficits, along with her history of complicated migraine headaches with resolution of deficits without specific treatment intervention.   LSN: 10:00 AM on 12/07/2012 tPA Given: No: No clear objective deficits and strong history of complicated migraine headaches. MRankin: 1  Past Medical History  Diagnosis Date  . Depression   . Anxiety   . Diabetes mellitus   . GERD (gastroesophageal reflux disease)   . ADD (attention deficit disorder)   . Hyperlipemia   . Herniated disc   . Allergy     Seasonal  . Diabetic neuropathy   . Shortness of breath     with exertion   . Sleep apnea     obsructive settings at 13   . Heart murmur     asa child  . Seizures     hx of / month ago - dehydration   . 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 Degenerative disc disease   . Annular disc tear   . Retrolisthesis     L4 and L5  . Bulging disc     Family History  Problem Relation Age of Onset  . Diabetes type II    . Diabetes Mother   . Hypertension Mother   . Depression Brother   .  Cancer Maternal Aunt     ovarian   . Depression Maternal Aunt   . Diabetes Maternal Aunt   . Cancer Maternal Grandmother     Lung  . COPD Maternal Grandmother   . Cancer Maternal Grandfather     Prostate  . Diabetes Maternal Grandfather   . Hearing loss Maternal Grandfather   . Hypertension Maternal Grandfather   . Stroke Maternal Grandfather   . Cancer Paternal Grandmother     Colon     Medications: I have reviewed the patient's current medications.  ROS: History obtained from chart review and the patient  General ROS: negative for - chills, fatigue, fever, night sweats, weight gain or weight loss Psychological ROS: negative for - behavioral disorder, hallucinations, memory difficulties, mood swings or suicidal ideation Ophthalmic ROS: negative for - blurry vision, double vision, eye pain or loss of vision ENT ROS: negative for - epistaxis, nasal discharge, oral lesions, sore throat, tinnitus or vertigo Allergy and Immunology ROS: negative for - hives or itchy/watery eyes Hematological and Lymphatic ROS: negative for - bleeding problems, bruising or swollen lymph nodes Endocrine ROS: negative for - galactorrhea, hair pattern changes, polydipsia/polyuria or temperature intolerance Respiratory ROS: negative for - cough, hemoptysis, shortness of breath or wheezing Cardiovascular ROS: negative for - chest pain, dyspnea on exertion, edema or irregular heartbeat Gastrointestinal ROS: negative for - abdominal pain,  diarrhea, hematemesis, nausea/vomiting or stool incontinence Genito-Urinary ROS: negative for - dysuria, hematuria, incontinence or urinary frequency/urgency Musculoskeletal ROS: Chronic back pain with known degenerative disc disease Neurological ROS: as noted in HPI Dermatological ROS: negative for rash and skin lesion changes  Physical Examination: Blood pressure 110/75, pulse 111, temperature 98.4 F (36.9 C), temperature source Oral, resp. rate 22, SpO2  100.00%.  Neurologic Examination: Mental Status: Lethargic, oriented to correct age and month.  Speech was slurred, commensurate with level of alertness and without evidence of aphasia. Able to follow commands. Cranial Nerves: II-Visual fields were normal. III/IV/VI-Pupils were equal and reacted. Extraocular movements were full and conjugate.    V/VII-slight left facial numbness; no facial weakness. VIII-normal. X-dysarthric speech, commensurate with level of alertness. Motor: Strength of left extremities difficult to assess due to poor effort; normal strength on the right; normal muscle tone throughout. Sensory: Reduced perception of tactile sensation over left extremities and trunk compared to right side, with splitting of the midline. Deep Tendon Reflexes: 1+ and symmetric. Plantars: Mute bilaterally Cerebellar: Unable to assess due to poor effort.  Ct Head Wo Contrast  12/07/2012   *RADIOLOGY REPORT*  Clinical Data: Code stroke.  Sudden onset of left-sided weakness and slurred speech.  CT HEAD WITHOUT CONTRAST  Technique:  Contiguous axial images were obtained from the base of the skull through the vertex without contrast.  Comparison: 10/03/2012  Findings: There is no acute intracranial hemorrhage, infarction, or mass lesion.  Brain parenchyma is normal.  The osseous structures are normal.  IMPRESSION: Normal exam.   Original Report Authenticated By: Francene Boyers, M.D.   Mr Brain Ltd W/o Cm  12/07/2012   *RADIOLOGY REPORT*  Clinical Data: Left-sided weakness.  MRI HEAD WITHOUT CONTRAST  Technique:  Multiplanar, multiecho pulse sequences of the brain and surrounding structures were obtained according to standard protocol without intravenous contrast.  Comparison: 12/07/2012 CT head.  08/02/2012 MR.  Findings: Examination limited to a diffusion sequence as per request.  No acute infarct.  IMPRESSION: Limited exam without evidence of acute infarct.   Original Report Authenticated By: Lacy Duverney, M.D.    Assessment: 37 y.o. female likely recurrent complicated migraine headache with associated mostly subjective motor and sensory changes. Patient has no clear objective focal neurological deficits. MRI study, again, showed no acute intracranial abnormality including no signs of acute ischemic lesion.  Stroke Risk Factors - diabetes mellitus and hyperlipidemia  Recommendations: No further neurodiagnostic studies are indicated. Recommend symptomatic treatment of headache with analgesic medication as needed, and tolerated the patient. No indication at this point for admission for repeat stroke workup, as patient was evaluated and underwent stroke workup and May 2014.   C.R. Roseanne Reno, MD [facility]  12/07/2012, 5:09 PM

## 2012-12-07 NOTE — Code Documentation (Signed)
37 yo female who drove her kids to 3 different schools today returning home around 1000 and c/o of headache and back pain for which she took 4 mg Dilaudid PO. She slept for a while, was last seen by her father-in-law around 1100 and seemed "okay."  Later she was noted to have slurred speech and L side weakness and EMS was called, activating code stroke at 1355. (They conferred with ED staff since pt. has h/o of hemiplegic migraines.) Pt arrived at 1400 and met by Stroke team and EDP. Neurologist here at 1409. CT: no acute abnormality. NIHSS is 10, but very poor effort by pt, splits midline with sensory deficit, drops LUE but avoids face, pushes down with LLE when lifting RLE, but unable to move LLE off bed. Neurologist requesting limited MRI (DWI only) to r/o stroke. Unable to get labs after 3 sticks by phlebotomist. SBP 112. RN attempting to get IV started. Taking to MRI w/o diff. O2 sats 86-89% on room air. O2 at 4 liters/min started and immediately up to 97-98%. Code stroke canceled once MRI results showed no stroke. Handoff to ED RN.

## 2012-12-07 NOTE — ED Notes (Signed)
Pt arrived to ED by [facility] with c/o headache, left sided weakness, slurred speech and nausea. Pt has hx of hemiplegic migraines. BP-112/74 HR-74

## 2012-12-07 NOTE — ED Provider Notes (Signed)
CSN: [id number]     Arrival date & time 12/07/12  1400 History   First MD Initiated Contact with Patient 12/07/12 1404     No chief complaint on file.  (Consider location/radiation/quality/duration/timing/severity/associated sxs/prior Treatment) HPI Comments: 37 year old female with a history of diplegic migraines, depression and anxiety as well as a history of obesity status post gastric bypass surgery. She presents by ambulance after calling 911 because of hemiplegia. The patient states that she does have a history of unilateral hemiplegia associated with migraines but states that that usually comes on slowly over days and today she felt like her symptoms came on quite quickly. They have been persistent, she was last seen normal at 11:00 when she dropped her daughter off at a rehabilitation session. She was then able to pick up her daughter, we're unsure exactly what time that was, lasting normal is greater than 3 hours prior to her arrival here. The symptoms are persistent, nothing makes it better or worse, she is unable to use left side of her body.  The history is provided by the patient, the EMS personnel and medical records.    Past Medical History  Diagnosis Date  . Depression   . Anxiety   . Diabetes mellitus   . GERD (gastroesophageal reflux disease)   . ADD (attention deficit disorder)   . Hyperlipemia   . Herniated disc   . Allergy     Seasonal  . Diabetic neuropathy   . Shortness of breath     with exertion   . Sleep apnea     obsructive settings at 13   . Heart murmur     asa child  . Seizures     hx of / month ago - dehydration   . 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 Degenerative disc disease   . Annular disc tear   . Retrolisthesis     L4 and L5  . Bulging disc    Past Surgical History  Procedure Laterality Date  . Cholecystectomy    . Cesarean section    . Tonsillectomy    . Laporoscopy  2007  .  Breath tek h pylori  09/11/2011    Procedure: BREATH TEK H PYLORI;  Surgeon: Mariella Saa, MD;  Location: [facility];  Service: General;  Laterality: N/A;  . 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 Bowel resection  11/30/2011    Procedure: SMALL BOWEL RESECTION;  Surgeon: Mariella Saa, MD;  Location: [facility];  Service: General;;  . Esophagogastroduodenoscopy  03/14/2012    Procedure: ESOPHAGOGASTRODUODENOSCOPY (EGD);  Surgeon: Kandis Cocking, MD;  Location: [facility];  Service: General;  Laterality: N/A;  . Esophagogastroduodenoscopy  04/25/2012    Procedure: ESOPHAGOGASTRODUODENOSCOPY (EGD);  Surgeon: Kandis Cocking, MD;  Location: [facility];  Service: General;  Laterality: N/A;   Family History  Problem Relation Age of Onset  . Diabetes type II    . Diabetes Mother   . Hypertension Mother   . Depression Brother   . Cancer Maternal Aunt     ovarian   . Depression Maternal Aunt   . Diabetes Maternal Aunt   . Cancer Maternal Grandmother     Lung  . COPD Maternal Grandmother   . Cancer Maternal Grandfather  Prostate  . Diabetes Maternal Grandfather   . Hearing loss Maternal Grandfather   . Hypertension Maternal Grandfather   . Stroke Maternal Grandfather   . Cancer Paternal Grandmother     Colon   History  Substance Use Topics  . Smoking status: Never Smoker   . Smokeless tobacco: Never Used  . Alcohol Use: No   OB History   Grav Para Term Preterm Abortions TAB SAB Ect Mult Living                 Review of Systems  All other systems reviewed and are negative.    Allergies  Atarax; Cephalosporins; Codeine; Compazine; Decadron; Demerol; Metoclopramide hcl; Morphine and related; Penicillins; Sulfa antibiotics; Triptans; Zofran; Ace inhibitors; Depo-medrol; Dihydroergotamine; Hydroxyzine; Meperidine; Metoclopramide; Pork-derived  products; Rizatriptan; Shellfish-derived products; Sumatriptan; Zanaflex; and Prozac  Home Medications   Current Outpatient Rx  Name  Route  Sig  Dispense  Refill  . ALPRAZolam (XANAX) 1 MG tablet   Oral   Take 1-2 mg by mouth 4 (four) times daily as needed for anxiety. For anxiety         . Cholecalciferol (VITAMIN D-3 PO)   Oral   Take 1 tablet by mouth daily.         . cyclobenzaprine (FLEXERIL) 10 MG tablet   Oral   Take 10 mg by mouth 3 (three) times daily as needed.          . cyproheptadine (PERIACTIN) 2 MG/5ML syrup      at bedtime. Take 62mls(8mg  total) at bedtime         . docusate sodium 100 MG CAPS   Oral   Take 100 mg by mouth 2 (two) times daily.   60 capsule   0   . doxepin (SINEQUAN) 50 MG capsule   Oral   Take 50 mg by mouth 3 (three) times daily.         Marland Kitchen HYDROmorphone (DILAUDID) 4 MG tablet   Oral   Take 4 mg by mouth 3 (three) times daily as needed for pain.         Marland Kitchen ketoconazole (NIZORAL) 2 % shampoo   Topical   Apply 1 application topically daily as needed. For scalp itching/pain.         . Methylphenidate HCl ER (QUILLIVANT XR) 25 MG/5ML SUSR   Oral   Take 12 mLs by mouth daily.         . Multiple Vitamins-Minerals (MULTIVITAMIN PO)   Oral   Take 1 tablet by mouth 2 (two) times daily.         Marland Kitchen OLANZapine (ZYPREXA) 2.5 MG tablet   Oral   Take 2.5 mg by mouth at bedtime. Take up to TID for hemiplegic migraines         . OnabotulinumtoxinA (BOTOX IJ)   Injection   Inject as directed every 3 (three) months.         . pantoprazole (PROTONIX) 40 MG tablet   Oral   Take 1 tablet (40 mg total) by mouth daily.   60 tablet   2   . promethazine (PHENERGAN) 25 MG/ML injection      INJECT 2 MLS (50 MG TOTAL) INTO THE MUSCLE AS NEEDED.         Marland Kitchen PROMETHAZINE HCL IJ   Injection   Inject 50 mLs as directed every 4 (four) hours as needed (for N/V with hemiplegic migraines).         . promethazine-phenylephrine  (  PROMETHAZINE VC) 6.25-5 MG/5ML SYRP   Oral   Take 20 mLs by mouth every 4 (four) hours as needed (N/V with hemiplegic migraines).         . ranitidine (ZANTAC) 150 MG tablet   Oral   Take 150 mg by mouth 2 (two) times daily.         Marland Kitchen senna (SENOKOT) 8.6 MG tablet   Oral   Take 1 tablet by mouth daily.         . vitamin B-12 (CYANOCOBALAMIN) 1000 MCG tablet   Oral   Take 2,000 mcg by mouth daily.          There were no vitals taken for this visit. Physical Exam  Nursing note and vitals reviewed. Constitutional: She appears well-developed and well-nourished.  HENT:  Head: Normocephalic and atraumatic.  Mouth/Throat: Oropharynx is clear and moist. No oropharyngeal exudate.  Eyes: Conjunctivae and EOM are normal. Pupils are equal, round, and reactive to light. Right eye exhibits no discharge. Left eye exhibits no discharge. No scleral icterus.  Neck: Normal range of motion. Neck supple. No JVD present. No thyromegaly present.  Cardiovascular: Normal rate, regular rhythm, normal heart sounds and intact distal pulses.  Exam reveals no gallop and no friction rub.   No murmur heard.  No carotid bruit, regular rhythm  Pulmonary/Chest: Effort normal and breath sounds normal. No respiratory distress. She has no wheezes. She has no rales.  Abdominal: Soft. Bowel sounds are normal. She exhibits no distension and no mass. There is no tenderness.  Musculoskeletal: Normal range of motion. She exhibits no edema and no tenderness.  Lymphadenopathy:    She has no cervical adenopathy.  Neurological: She is alert. Coordination normal.  Speech is very slowed, patient appears drowsy, left-sided facial droop is mild, left upper extremity paretic, left lower extremities paretic.  Right upper and right lower extremity with normal strength and sensation.  Skin: Skin is warm and dry. No rash noted. No erythema.  Psychiatric: She has a normal mood and affect. Her behavior is normal.    ED Course   Procedures (including critical care time) Labs Review Labs Reviewed  ETHANOL  PROTIME-INR  APTT  CBC  DIFFERENTIAL  COMPREHENSIVE METABOLIC PANEL  TROPONIN I  URINE RAPID DRUG SCREEN (HOSP PERFORMED)  URINALYSIS, ROUTINE W REFLEX MICROSCOPIC   Imaging Review No results found.  MDM  No diagnosis found. The patient has come as a code stroke, she is outside the three-hour window however she does have a dense hemiplegia on the left, there is some question as to whether this is related to a migrainous event or whether this is related to an acute stroke. The stroke team is at the bedside, the patient has gone to CT scan.  The pt has been reevaluated at 3:40 - no focal abnormalities - she has normal MRI without acute stroke, she likely  Has ongoing hemiplegic migraine - no recommendation made by neuro - pt takes dilaudid daily for chronic back pain and headaches.  Meds ordered including depakote, dilaudid, phenergan.  Dr. Roseanne Reno is in agreement.  Change of shift, care signed out to Dr. Gustavus Messing, he will follow up on patient's improvement, patient appears hemodynamically stable at this time, heart rate approximately 105, persistent neuro deficits despite the negative MRI, medications have not been giving as there is no IV established. Nursing questioned and stated that they have been tending to other patients, they will pursue IV access at this time.  The husband has called  one of the nurses and stated that he thinks that she may be using too much Dilaudid however the patient does not appear to be neurotoxic or opiate toxic from Dilaudid. Will continue to monitor and make sure she is stable prior to discharge.  Vida Roller, MD 12/07/12 [phone]

## 2012-12-08 DIAGNOSIS — F449 Dissociative and conversion disorder, unspecified: Secondary | ICD-10-CM | POA: Diagnosis not present

## 2012-12-08 DIAGNOSIS — E119 Type 2 diabetes mellitus without complications: Secondary | ICD-10-CM | POA: Diagnosis not present

## 2012-12-08 DIAGNOSIS — Z9884 Bariatric surgery status: Secondary | ICD-10-CM

## 2012-12-08 DIAGNOSIS — G43409 Hemiplegic migraine, not intractable, without status migrainosus: Secondary | ICD-10-CM | POA: Diagnosis not present

## 2012-12-08 LAB — GLUCOSE, CAPILLARY
Glucose-Capillary: 100 mg/dL — ABNORMAL HIGH (ref 70–99)
Glucose-Capillary: 134 mg/dL — ABNORMAL HIGH (ref 70–99)
Glucose-Capillary: 194 mg/dL — ABNORMAL HIGH (ref 70–99)
Glucose-Capillary: 92 mg/dL (ref 70–99)

## 2012-12-08 MED ORDER — CYPROHEPTADINE HCL 4 MG PO TABS
8.0000 mg | ORAL_TABLET | Freq: Every day | ORAL | Status: DC
  Filled 2012-12-08 (×3): qty 2

## 2012-12-08 MED ORDER — PROMETHAZINE HCL 6.25 MG/5ML PO SYRP
6.2500 mg | ORAL_SOLUTION | ORAL | Status: DC | PRN
  Administered 2012-12-08 – 2012-12-09 (×2): 6.25 mg via ORAL
  Filled 2012-12-08 (×7): qty 5

## 2012-12-08 MED ORDER — PHENYLEPHRINE HCL 10 MG PO TABS
5.0000 mg | ORAL_TABLET | ORAL | Status: DC | PRN
Start: 2012-12-08 — End: 2012-12-09
  Filled 2012-12-08: qty 1

## 2012-12-08 NOTE — Progress Notes (Signed)
[facility] PROGRESS NOTE  [name]:[id number] DOB: [birth year] DOA: 12/07/2012 PCP: Neldon Labella, MD  Assessment/Plan: 1. Conversion disorder - Neurology has evaluated the patient, please see their note, no reason to believe the patient is having an ischemic stroke at this time.  Pt does have complex migraines.  I had a long discussion with patient about follow up with her psychiatrist and social worker met with patient today to offer resources for patient to seek treatment. I also had long discussion with her husband and father in law and mother. Awaiting for PT evaluation.   2. Chronic narcotic and benzo use - do not wish to increase narcotic dose at this time, will leave patient on her home medications and doses of xanax and po dilaudid for now in hospital, but numerous red flags have been raised by family members including spouse regarding use / abuse of these prescription medications (please see HPI for details).  I am very concerned given the now reported multiple episodes of LOC, including an encephalopathy admission in July that patient is at risk for bad outcome or even death from accidental overdose. Pt had declined inpatient treatment for prescription drug abuse but reports that she is willing to do outpatient treatment.   3. DM2 - does not appear to be on home meds at this time (probably as she is S/P roux-en-y, will monitor CBGs HPI/Subjective: Pt had been asking to go home. She did meet with social worker and received resource information about outpatient treatment of prescription drug addiction.   Objective: Filed Vitals:   12/08/12 1315  BP: 99/54  Pulse: 87  Temp: 98.3 F (36.8 C)  Resp: 18   No intake or output data in the 24 hours ending 12/08/12 1724 Filed Weights   12/08/12 0000  Weight: 90.765 kg (200 lb 1.6 oz)    Exam:  General: NAD, resting comfortably in bed  Eyes: PEERLA EOMI  ENT: mucous membranes moist  Neck: supple w/o JVD   Cardiovascular: RRR w/o MRG  Respiratory: CTA B  Abdomen: soft, nt, nd, bs+  Skin: no rash nor lesion  Musculoskeletal: MAE, full ROM all 4 extremities  Psychiatric: normal tone and affect  Neurologic: AAOx3, poor effort on left side     Data Reviewed: Basic Metabolic Panel:  Recent Labs Lab 12/07/12 1452 12/07/12 1643  NA 134* 136  K 4.8 4.8  CL 96 100  CO2 26  --   GLUCOSE 108* 110*  BUN 10 9  CREATININE 0.55 0.80  CALCIUM 9.2  --    Liver Function Tests:  Recent Labs Lab 12/07/12 1452  AST 19  ALT 18  ALKPHOS 83  BILITOT 0.4  PROT 7.4  ALBUMIN 4.2   No results found for this basename: LIPASE, AMYLASE,  in the last 168 hours No results found for this basename: AMMONIA,  in the last 168 hours CBC:  Recent Labs Lab 12/07/12 1452 12/07/12 1643  WBC 12.7*  --   NEUTROABS 10.7*  --   HGB 14.1 15.3*  HCT 41.8 45.0  MCV 82.3  --   PLT 239  --    Cardiac Enzymes:  Recent Labs Lab 12/07/12 1402  TROPONINI <0.30   BNP (last 3 results) No results found for this basename: PROBNP,  in the last 8760 hours CBG:  Recent Labs Lab 12/07/12 2016 12/08/12 0004 12/08/12 0700 12/08/12 1151 12/08/12 1605  GLUCAP 111* 92 96 100* 194*    No results found for this or any  previous visit (from the past 240 hour(s)).   Studies: Ct Head Wo Contrast  12/07/2012   *RADIOLOGY REPORT*  Clinical Data: Code stroke.  Sudden onset of left-sided weakness and slurred speech.  CT HEAD WITHOUT CONTRAST  Technique:  Contiguous axial images were obtained from the base of the skull through the vertex without contrast.  Comparison: 10/03/2012  Findings: There is no acute intracranial hemorrhage, infarction, or mass lesion.  Brain parenchyma is normal.  The osseous structures are normal.  IMPRESSION: Normal exam.   Original Report Authenticated By: Francene Boyers, M.D.   [facility] W/o Cm  12/07/2012   *RADIOLOGY REPORT*  Clinical Data: Left-sided weakness.  MRI HEAD WITHOUT  CONTRAST  Technique:  Multiplanar, multiecho pulse sequences of the brain and surrounding structures were obtained according to standard protocol without intravenous contrast.  Comparison: 12/07/2012 CT head.  08/02/2012 MR.  Findings: Examination limited to a diffusion sequence as per request.  No acute infarct.  IMPRESSION: Limited exam without evidence of acute infarct.   Original Report Authenticated By: Lacy Duverney, M.D.    Scheduled Meds: . cholecalciferol  2,000 Units Oral Daily  . vitamin B-12  2,500 mcg Oral Daily  . cyproheptadine  8 mg Oral QHS  . docusate sodium  100 mg Oral BID  . doxepin  150 mg Oral QHS  . doxepin  50 mg Oral TID  . famotidine  20 mg Oral BID  . ferrous sulfate  325 mg Oral Q breakfast  . heparin  5,000 Units Subcutaneous Q8H  . loratadine  10 mg Oral Daily  . methylphenidate  60 mg Oral Daily  . multivitamin with minerals  2 tablet Oral Daily  . OLANZapine  2.5 mg Oral QHS  . pantoprazole  40 mg Oral Daily  . senna  1 tablet Oral Daily   Continuous Infusions:   Principal Problem:   Headache, hemiplegic migraine Active Problems:   Conversion disorder   Diabetes type 2, uncontrolled   [employer] [phone]. If 7PM-7AM, please contact night-coverage at [email or web address], password [facility] 12/08/2012, 5:24 PM  LOS: 1 day

## 2012-12-08 NOTE — Clinical Social Work Note (Signed)
Psych CSW met with pt at bedside. Psych CSW provided resources and education regarding resource navigation and different levels of treatment.  Pt is interested in possible outpatient SA treatment for benzos and opiates.  This information was reviewed with the pt.  Psych CSW used teach back.  Pt acknowledged and demonstrated understanding of these resources and how to activate and use them.  Psych CSW signing off.  Vickii Penna, LCSWA [phone]  Clinical Social Work

## 2012-12-08 NOTE — Evaluation (Signed)
Occupational Therapy Evaluation Patient Details Name: [name] MRN: [id number] DOB: 02/20/1976 Today's Date: 12/08/2012 Time: 4098-1191 OT Time Calculation (min): 16 min  OT Assessment / Plan / Recommendation History of present illness 37 y.o. female following development of left-sided numbness and weakness with associated headache. Patient was last known well at around 10 AM today. CT scan of her head showed no acute intracranial abnormality. . Imaging studies including MRI have been negative. Patient reportedly took 4 mg of Dilaudid for headache earlier and has been lethargic. Exam findings were unreliable. NIH stroke score was 10. MRI study of her brain showed no acute intracranial abnormalities. Code stroke was canceled, as patient had no objective deficits, along with her history of complicated migraine headaches with resolution of deficits without specific treatment intervention.    Clinical Impression   PT admitted with Lt side weakness with negative workup thus far. Pt currently with functional limitiations due to the deficits listed below (see OT problem list).  Pt will benefit from skilled OT to increase their independence and safety with adls and balance to allow discharge SNF. Pt demonstrates bed bound level at this time with father in law that can provide min (A) level. Pt best d/c recommendation is SNF at this time. Pt inconsistent in testing with LT UE during session.      OT Assessment  Patient needs continued OT Services    Follow Up Recommendations  SNF    Barriers to Discharge      Equipment Recommendations  Other (comment) (TBA- defer to snf)    Recommendations for Other Services    Frequency  Min 2X/week    Precautions / Restrictions Precautions Precautions: Fall   Pertinent Vitals/Pain Nausea and pain    ADL  Eating/Feeding: Set up Where Assessed - Eating/Feeding: Bed level Lower Body Dressing: Moderate assistance Where Assessed - Lower Body Dressing:  Unsupported sitting (able to reach bil LE with Rt UE) Transfers/Ambulation Related to ADLs: not accessed due to testing UB/LB testing flaccid and need for total+2 (A) ADL Comments: Pt supine on arrival with heat packs on bil sides of neck. Pt reports severe migraine. Pt with no AROM tested in LT UE / LB. Pt asked to completed supine <>S it. During transfer pt weight bearing on LT UE demonstrating ability to bear weight and tricep use. PT tested flaccided supine. Pt using Rt UE to pull LT LE off eob with sock. Pt with excellent core strength and no wekaness noted during transfer    OT Diagnosis: Generalized weakness;Paresis;Hemiplegia non-dominant side  OT Problem List: Decreased strength;Decreased range of motion;Decreased activity tolerance;Impaired balance (sitting and/or standing);Decreased safety awareness;Decreased knowledge of use of DME or AE;Decreased knowledge of precautions;Impaired UE functional use OT Treatment Interventions: Self-care/ADL training;Therapeutic exercise;Neuromuscular education;DME and/or AE instruction;Therapeutic activities;Patient/family education;Balance training   OT Goals(Current goals can be found in the care plan section) Acute Rehab OT Goals OT Goal Formulation: With patient Time For Goal Achievement: 12/22/12 Potential to Achieve Goals: Good  Visit Information  Last OT Received On: 12/08/12 Assistance Needed: +2 History of Present Illness: 37 y.o. female following development of left-sided numbness and weakness with associated headache. Patient was last known well at around 10 AM today. CT scan of her head showed no acute intracranial abnormality. . Imaging studies including MRI have been negative. Patient reportedly took 4 mg of Dilaudid for headache earlier and has been lethargic. Exam findings were unreliable. NIH stroke score was 10. MRI study of her brain showed no acute intracranial  abnormalities. Code stroke was canceled, as patient had no objective  deficits, along with her history of complicated migraine headaches with resolution of deficits without specific treatment intervention.        Prior Functioning     Home Living Family/patient expects to be discharged to:: Private residence Living Arrangements: Children;Spouse/significant other;Other relatives (father in law) Available Help at Discharge: Available 24 hours/day (father in law) Type of Home: House Home Access: Stairs to enter [employer] of Steps: 4 Entrance Stairs-Rails: Right Home Layout: Multi-level;Bed/bath upstairs Home Equipment: Walker - 4 wheels (used when migraine caused hemiplegia before) Prior Function Level of Independence: Independent Communication Communication: No difficulties Dominant Hand: Right         Vision/Perception Vision - History Baseline Vision: Wears glasses only for reading Patient Visual Report: No change from baseline   Cognition  Cognition Arousal/Alertness: Awake/alert Behavior During Therapy: WFL for tasks assessed/performed Overall Cognitive Status: Within Functional Limits for tasks assessed    Extremity/Trunk Assessment Upper Extremity Assessment Upper Extremity Assessment: LUE deficits/detail LUE Deficits / Details: pt presents flaccid for wrist digits elbow and shoulder AROM. PT able to shug shoulder demonstrating deltoid and trap LUE Sensation: decreased light touch (describes as tingling) LUE Coordination: decreased fine motor;decreased gross motor Lower Extremity Assessment Lower Extremity Assessment: Defer to PT evaluation;LLE deficits/detail LLE Deficits / Details: flaccid during general OT assessment no AROM noted Cervical / Trunk Assessment Cervical / Trunk Assessment: Normal     Mobility Bed Mobility Bed Mobility: Supine to Sit;Sitting - Scoot to [employer] of Bed;Sit to Supine Supine to Sit: 4: Min guard;HOB flat Sitting - Scoot to [employer] of Bed: 4: Min guard Sit to Supine: 4: Min guard;HOB  flat Details for Bed Mobility Assistance: pt using RT UE to pull LT LE to eob with sock. pt with NO LOB and no use of rail. Extended time required Transfers Transfers: Not assessed     Exercise     Balance Static Sitting Balance Static Sitting - Balance Support: Feet supported Static Sitting - Level of Assistance: 6: Modified independent (Device/Increase time)   End of Session OT - End of Session Activity Tolerance: Patient tolerated treatment well Patient left: in bed;with call bell/phone within reach;with nursing/sitter in room (RN Fuller [place] called due to nausea and pain) Nurse Communication: Mobility status;Precautions  GO Functional Assessment Tool Used: clinical judgement Functional Limitation: Self care Self Care Current Status (Z6109): At least 60 percent but less than 80 percent impaired, limited or restricted Self Care Goal Status (U0454): At least 60 percent but less than 80 percent impaired, limited or restricted   Harolyn Rutherford 12/08/2012, 4:23 PM Pager: [phone]

## 2012-12-09 DIAGNOSIS — Z9884 Bariatric surgery status: Secondary | ICD-10-CM | POA: Diagnosis not present

## 2012-12-09 DIAGNOSIS — E119 Type 2 diabetes mellitus without complications: Secondary | ICD-10-CM | POA: Diagnosis not present

## 2012-12-09 DIAGNOSIS — F449 Dissociative and conversion disorder, unspecified: Secondary | ICD-10-CM | POA: Diagnosis not present

## 2012-12-09 DIAGNOSIS — Z8669 Personal history of other diseases of the nervous system and sense organs: Secondary | ICD-10-CM

## 2012-12-09 DIAGNOSIS — IMO0002 Reserved for concepts with insufficient information to code with codable children: Secondary | ICD-10-CM | POA: Diagnosis present

## 2012-12-09 DIAGNOSIS — G4733 Obstructive sleep apnea (adult) (pediatric): Secondary | ICD-10-CM

## 2012-12-09 MED ORDER — ALPRAZOLAM 1 MG PO TABS
1.0000 mg | ORAL_TABLET | Freq: Every evening | ORAL | Status: DC | PRN
Start: 2012-12-09 — End: 2013-02-10

## 2012-12-09 NOTE — Clinical Social Work Note (Signed)
Clinical Social Work Department BRIEF PSYCHOSOCIAL ASSESSMENT 12/09/2012  Patient:  Angelica, [name]     Account Number:  [id number]     Admit date:  12/07/2012  Clinical Social Worker:  Read Drivers  Date/Time:  12/08/2012 09:01 AM  Referred by:  Physician  Date Referred:  12/08/2012 Referred for  SNF Placement  Substance Abuse   Other Referral:   none   Interview type:  Patient Other interview type:   none    PSYCHOSOCIAL DATA Living Status:  FAMILY Admitted from facility:   Level of care:   Primary support name:  [name] Primary support relationship to patient:  SPOUSE Degree of support available:   adequate    CURRENT CONCERNS Current Concerns  Substance Abuse  Post-Acute Placement   Other Concerns:   none    SOCIAL WORK ASSESSMENT / PLAN CSW assessed pt at bedside.  CSW introduced self and CSW role.  Pt was alert and oriented sitting up in the bed eating dinner.  Pt reports hx of SA (prescription drugs - benzos and opiates).  Pt reports husband threatening to leave her if she does not get help.  Pt reports having an 48 year old child at home.  Pt UDS was positive for both benzos and opiates.  Pt states that she has a hx of migranes and is on disability for these debilitating migranes.  Pt reports that she is seeking outpatient tx for SA.  CSW provided resources and education regarding navigating such resources.  CSW gave suicide prevention information (24 hour talk line) as well as detox and inpatient residential treatment information/resources.  Pt was agreeable only to outpatient SA at this time as she wants to be able to live with her family.  Pt acknowledged understanding and through teach back the pt demonstrated understanding.  CSW folded this information up for the pt and placed in her personal belongings.  MD, Laural Benes was entering the pt room when CSW was fininshing up.  It appears that OT is recommending SNF prior to the pt returning home.  Unit CSW to follow  up on this recommendation.  It is unlikely that pt will be agreeable to the SNF recommendation due to her request to be with her family.  HH PT/OT (if insurance approves and MD/PT/OT is agreeable) may be more appropriate for her situation - unit CSW to follow-up.   Assessment/plan status:  Psychosocial Support/Ongoing Assessment of Needs Other assessment/ plan:   none   Information/referral to community resources:   SNF  Outpatient SA treatment facilities  Detox Treatment Facilities  Inpatient Residential Treatment Facilities    PATIENT'S/FAMILY'S RESPONSE TO PLAN OF CARE: Pt was appreciative of the support and assistance that this CSW gave.       Vickii Penna, LCSWA [phone]  Clinical Social Work

## 2012-12-09 NOTE — Evaluation (Signed)
Physical Therapy Evaluation Patient Details Name: [name] MRN: [id number] DOB: [birth year] Today's Date: 12/09/2012 Time: 4098-1191 PT Time Calculation (min): 38 min  PT Assessment / Plan / Recommendation History of Present Illness  37 y.o. female following development of left-sided numbness and weakness with associated headache. Patient was last known well at around 10 AM today. CT scan of her head showed no acute intracranial abnormality. . Imaging studies including MRI have been negative. Patient reportedly took 4 mg of Dilaudid for headache earlier and has been lethargic. Exam findings were unreliable. NIH stroke score was 10. MRI study of her brain showed no acute intracranial abnormalities. Code stroke was canceled, as patient had no objective deficits, along with her history of complicated migraine headaches with resolution of deficits without specific treatment intervention.   Clinical Impression  Pt admitted with stroke-like symptoms (as above). 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 She demonstrated safe use of RW and was able to verbalize how to progress up/down steps prior to attempting. She agrees no further HHPT needed at this time. Anticipate d/c home with family support today.    PT Assessment  Patent does not need any further PT services    Follow Up Recommendations  No PT follow up;Supervision - Intermittent    Does the patient have the potential to tolerate intense rehabilitation      Barriers to Discharge        Equipment Recommendations  Rolling walker with 5" wheels    Recommendations for Other Services     Frequency      Precautions / Restrictions Precautions Precautions: Fall   Pertinent Vitals/Pain Denied pain      Mobility  Bed Mobility Bed Mobility: Supine to Sit;Sitting - Scoot to Edge of Bed Supine to Sit: 6: Modified independent (Device/Increase time);HOB flat Sitting - Scoot to Edge of Bed: 6: Modified  independent (Device/Increase time) Details for Bed Mobility Assistance: pt used UEs to advance LLE over EOB Transfers Transfers: Sit to Stand;Stand to Sit Sit to Stand: 5: Supervision;6: Modified independent (Device/Increase time);With upper extremity assist Stand to Sit: 5: Supervision;6: Modified independent (Device/Increase time);With upper extremity assist Details for Transfer Assistance: x 3 various surfaces/heights; pt with proper sequencing with RW Ambulation/Gait Ambulation/Gait Assistance: 4: Min assist;4: Min guard Ambulation Distance (Feet): 50 Feet Assistive device: Rolling walker Ambulation/Gait Assistance Details: pt with diffculty advancing LLE and required instructional cues on sequencing to incr ease and manual faciliation to advance LLE for ~1/2 of distance walked Gait Pattern: Step-to pattern;Decreased step length - left Stairs: Yes Stairs Assistance: 4: Min assist Stairs Assistance Details (indicate cue type and reason): assisted lifting LLE onto step x 1, then pt able to do herself Stair Management Technique: Two rails;Step to pattern;Forwards Number of Stairs: 2 Modified Rankin (Stroke Patients Only) Pre-Morbid Rankin Score: No symptoms Modified Rankin: Moderately severe disability    Exercises     PT Diagnosis:    PT Problem List:   PT Treatment Interventions:       PT Goals(Current goals can be found in the care plan section) Acute Rehab PT Goals Patient Stated Goal: go home today to her own bed  Visit Information  Last PT Received On: 12/09/12 Assistance Needed: +1 History of Present Illness: 37 y.o. female following development of left-sided numbness and weakness with associated headache. Patient was last known well at around 10 AM today. CT scan of her head showed no acute intracranial abnormality. . Imaging studies including MRI have  been negative. Patient reportedly took 4 mg of Dilaudid for headache earlier and has been lethargic. Exam findings were  unreliable. NIH stroke score was 10. MRI study of her brain showed no acute intracranial abnormalities. Code stroke was canceled, as patient had no objective deficits, along with her history of complicated migraine headaches with resolution of deficits without specific treatment intervention.        Prior Functioning  Home Living Family/patient expects to be discharged to:: Private residence Living Arrangements: Children;Spouse/significant other;Other relatives (father in law) Available Help at Discharge: Available 24 hours/day (father in law) Type of Home: House Home Access: Stairs to enter [employer] of Steps: 4 Entrance Stairs-Rails: Right Home Layout: Multi-level;Bed/bath upstairs Home Equipment: Other (comment) (wide RW (obtained prior to bariatric surgery)) Prior Function Level of Independence: Independent Communication Communication: No difficulties    Cognition  Cognition Arousal/Alertness: Awake/alert Behavior During Therapy: WFL for tasks assessed/performed Overall Cognitive Status: Within Functional Limits for tasks assessed    Extremity/Trunk Assessment Upper Extremity Assessment Upper Extremity Assessment: LUE deficits/detail LUE Deficits / Details: pt using LUE functionally, including gripping RW handle, supporting body weight on RW and on rails with stair training Lower Extremity Assessment Lower Extremity Assessment: LLE deficits/detail LLE Deficits / Details: unable to overcome gravity in sitting initially, however with AAROM, pt able to achieve 3-/5 knee extension; functionally needed assist initially to advance LLE with stepping, however then able to maintain knee control (no buckling) during stance phase (including as descending steps) LLE Sensation: decreased light touch (reports entire lower leg up to knee is numb) LLE Coordination:  (unable to assess due to weakness) Cervical / Trunk Assessment Cervical / Trunk Assessment: Normal   Balance  Balance Balance Assessed: Yes Static Sitting Balance Static Sitting - Balance Support: Feet supported;No upper extremity supported Static Sitting - Level of Assistance: 7: Independent Dynamic Sitting Balance Dynamic Sitting - Balance Support: No upper extremity supported;Feet unsupported;During functional activity (donning shoes) Dynamic Sitting - Level of Assistance: 7: Independent Static Standing Balance Static Standing - Balance Support: Bilateral upper extremity supported Static Standing - Level of Assistance: 6: Modified independent (Device/Increase time)  End of Session PT - End of Session Equipment Utilized During Treatment: Gait belt Activity Tolerance: Patient tolerated treatment well Patient left: in chair;with call bell/phone within reach;with family/visitor present Nurse Communication: Mobility status  GP Functional Assessment Tool Used: clinical judgement Functional Limitation: Mobility: Walking and moving around Mobility: Walking and Moving Around Current Status [phone]): At least 1 percent but less than 20 percent impaired, limited or restricted Mobility: Walking and Moving Around Goal Status [phone]): At least 1 percent but less than 20 percent impaired, limited or restricted Mobility: Walking and Moving Around Discharge Status [phone]): At least 1 percent but less than 20 percent impaired, limited or restricted   Hayzen Lorenson 12/09/2012, 11:37 AM Pager [phone]

## 2012-12-09 NOTE — Discharge Summary (Signed)
Physician Discharge Summary  LAKRESHA STIFTER ZOX:[id number] DOB: 10/23/1975 DOA: 12/07/2012  PCP: Neldon Labella, MD  Admit date: 12/07/2012 Discharge date: 12/09/2012  Recommendations for Outpatient Follow-up:  1. Outpatient treatment for prescription drug addiction 2. 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 female who presents to the emergency room and code stroke status following development of left-sided numbness and weakness with associated headache. Patient was last known well at around 10 AM today. CT scan of her head showed no acute intracranial abnormality. Patient has had multiple presentations with similar complaints. Imaging studies including MRI have been negative. Patient reportedly took 4 mg of Dilaudid for headache earlier and has been lethargic. Exam findings were unreliable. NIH stroke score was 10. MRI study of her brain showed no acute intracranial abnormalities. Code stroke was canceled, as patient had no objective deficits, along with her history of complicated migraine headaches with resolution of deficits without specific treatment intervention.  Upon further discussion with the patient's husband first by Dr. Hyacinth Meeker in the ED and then by myself, numerous red flags are being raised regarding the patients use / overuse of prescription narcotics and benzodiazepines. Specifically she has had an admission recently for apparent overdose with encephalopathy believed to be due to this, she has also had episodes at home, where the husband states he had to pick her up unconscious off of the  floor. Additionally despite filling a script of dilaudid yesterday, over half the pills are missing although this last could be due to her putting them in a different location as she states she does. Patient's husband has also become concerned with her driving ability while on these medications. There have been family discussions about getting the patient help with regards to these issues.   Hospital Course:  1. Conversion disorder - Neurology has evaluated the patient, please see their note, no reason to believe the patient is having an ischemic stroke at this time. Pt does have complex migraines. I had a long discussion with patient about follow up with her psychiatrist and social worker met with patient today to offer resources for patient to seek treatment. I also had long discussion with her husband and father in law and mother.  2. Chronic narcotic and benzo use - I had a long discussion with patient about not mixing narcotics and benzodiazepines and that she has been misusing these medications and is at VERY HIGH RISK for a bad outcome including death and patient verbalized understanding. Numerous red flags have been raised by family members including spouse regarding use / abuse of these prescription medications (please see HPI for details). I am very concerned given the now reported multiple episodes of LOC, including an encephalopathy admission in July that patient is at risk for bad outcome or even death from accidental overdose. Pt had declined inpatient treatment for prescription drug abuse but reports that she is willing to do outpatient treatment.  Pt met with Child psychotherapist and received multiple resources for assistance with her prescription drug addiction problems.  She says that she will do outpatient treatment.  I told her to please stop going to multiple providers and getting prescriptions for controlled substances.  The patient says that she will stop doing this.  Also, will send this to as  many of her providers that I have contact information for them to review what is going on with her.   3. DM2 - does not appear to be on home meds at this time (probably as she is S/P roux-en-y, will monitor CBGs   Consultations:  Neurology  Social worker  Discharge Exam: Filed Vitals:   12/09/12 1000  BP: 112/66  Pulse: 105  Temp: 98.1 F (36.7 C)  Resp: 18   General: NAD, resting comfortably in chair Eyes: PEERLA EOMI  ENT: mucous membranes moist  Neck: supple w/o JVD  Cardiovascular: RRR w/o MRG  Respiratory: CTA B  Abdomen: soft, nt, nd, bs+  Skin: no rash nor lesion  Musculoskeletal: MAE, full ROM all 4 extremities  Psychiatric: normal tone and affect  Neurologic: AAOx3, poor effort on left side   Discharge Instructions  Discharge Orders   Future Appointments Provider Department Dept Phone   01/03/2013 8:15 AM [facility] PRIMARY CARE ENDOCRINOLOGY [phone]   01/06/2013 10:15 AM Reather Littler, MD [facility] PRIMARY CARE ENDOCRINOLOGY [phone]   01/11/2013 8:30 AM Orvil Feil Himmelrich, RD [facility] [phone]   Future Orders Complete By Expires   Discharge instructions  As directed    Comments:     No driving or operating machinery until cleared by primary care physician and neurologist Do not mix xanax and dilaudid or take at the same time  Return if symptoms recur, worsen or new problems develop. See your primary care physician in 1 week.   Increase activity slowly  As directed        Medication List    STOP taking these medications       cyclobenzaprine 10 MG tablet  Commonly known as:  FLEXERIL      TAKE these medications       ALPRAZolam 1 MG tablet  Commonly known as:  XANAX  Take 1 tablet (1 mg total) by mouth at bedtime as needed for anxiety. Do not mix with narcotic medication or alcohol.     bisacodyl 5 MG EC tablet  Commonly known as:  DULCOLAX  Take 5 mg by mouth daily as  needed for constipation.     BOTOX IJ  Inject 1 each as directed every 3 (three) months. For migraines     CALCIUM-VITAMIN D PO  Take 3 tablets by mouth daily.     cyproheptadine 2 MG/5ML syrup  Commonly known as:  PERIACTIN  Take 8 mg by mouth at bedtime.     HYDROmorphone 4 MG tablet  Commonly known as:  DILAUDID  Take 4 mg by mouth 3 (three) times daily as needed for pain.     multivitamin with minerals Tabs tablet  Take 2 tablets by mouth daily.     OLANZapine 2.5 MG tablet  Commonly known as:  ZYPREXA  Take 2.5 mg by mouth 3 (three) times daily as needed (for hemiplegic migraines).     pantoprazole 40 MG tablet  Commonly known as:  PROTONIX  Take 1 tablet (40 mg total) by mouth daily.     promethazine 25 MG/ML injection  Commonly known as:  PHENERGAN  Inject 50 mg into the muscle once. For hemiplegic migraines     senna 8.6 MG tablet  Commonly known as:  [employer]  Take 1 tablet by mouth daily.     Vitamin B-12 2500 MCG Subl  Place 2,500 mcg under the tongue daily.       Allergies  Allergen Reactions  . Ace Inhibitors Other (See Comments)    Spaces out and triggers migraines  . Atarax [Hydroxyzine Hcl] Anaphylaxis  . Cephalosporins Anaphylaxis  . Codeine Anaphylaxis    Tolerates Dilaudid and Percocet.  . Compazine Anaphylaxis    Tolerates Phenergan.  . Decadron [Dexamethasone] Anaphylaxis  . Demerol Anaphylaxis  . Metoclopramide Hcl Anaphylaxis  . Morphine And Related Anaphylaxis    Tolerates Dilaudid and Percocet.  . Other Other (See Comments)    ALL STEROIDS CAUSES ULCERS.   Marland Kitchen Penicillins Anaphylaxis  . Pork-Derived Products Other (See Comments)    Religous reasons--per pt ok to take Heparin when medically necessary  . Prozac [Fluoxetine Hcl] Other (See Comments)    Pt reports severe negative mood altering after taking.   Marland Kitchen Shellfish-Derived Products Other (See Comments)    Religous reasons  . Sulfa Antibiotics Anaphylaxis  . Triptans Anaphylaxis     Makes migraines worse also  . Zanaflex [Tizanidine Hcl] Other (See Comments)    Loss of balance, lightheadedness, spaces out  . Zofran Anaphylaxis  . Dihydroergotamine Other (See Comments)    unknown       Follow-up Information   Follow up with Neldon Labella, MD. Schedule an appointment as soon as possible for a visit in 1 week.   Specialty:  Family Medicine   Contact information:   [address] [place] [address] [phone]       Follow up with RAMOS,RICHARD D, MD. Schedule an appointment as soon as possible for a visit in 2 weeks.   Specialty:  Physical Medicine and Rehabilitation   Contact information:   [address] [place] [address] [phone]       Call Outpatient treatment . (Options provided by Child psychotherapist)        The results of significant diagnostics from this hospitalization (including imaging, microbiology, ancillary and laboratory) are listed below for reference.    Significant Diagnostic Studies: Ct Head Wo Contrast  12/07/2012   *RADIOLOGY REPORT*  Clinical Data: Code stroke.  Sudden onset of left-sided weakness and slurred speech.  CT HEAD WITHOUT CONTRAST  Technique:  Contiguous axial images were obtained from the base of the skull through the vertex without contrast.  Comparison: 10/03/2012  Findings: There is no acute intracranial hemorrhage, infarction, or mass lesion.  Brain parenchyma is normal.  The osseous structures are normal.  IMPRESSION: Normal exam.   Original Report Authenticated By: Francene Boyers, M.D.   Mr Brain Ltd W/o Cm  12/07/2012   *RADIOLOGY REPORT*  Clinical Data: Left-sided weakness.  MRI HEAD WITHOUT CONTRAST  Technique:  Multiplanar, multiecho pulse sequences of the brain and surrounding structures were obtained according to standard protocol without intravenous contrast.  Comparison: 12/07/2012 CT head.  08/02/2012 MR.  Findings: Examination limited to a diffusion sequence as per request.  No  acute infarct.  IMPRESSION: Limited exam without evidence of acute infarct.   Original Report Authenticated By: Lacy Duverney, M.D.    Microbiology: No results found for this or any previous visit (from the past 240 hour(s)).   Labs: Basic Metabolic Panel:  Recent Labs Lab 12/07/12 1452 12/07/12 1643  NA 134* 136  K 4.8 4.8  CL 96 100  CO2 26  --   GLUCOSE 108* 110*  BUN 10 9  CREATININE 0.55 0.80  CALCIUM 9.2  --    Liver Function Tests:  Recent Labs Lab 12/07/12 1452  AST 19  ALT 18  ALKPHOS 83  BILITOT 0.4  PROT 7.4  ALBUMIN 4.2   No results found for this basename: LIPASE, AMYLASE,  in the last 168 hours No results found for this basename: AMMONIA,  in the last 168 hours CBC:  Recent Labs Lab 12/07/12 1452 12/07/12 1643  WBC 12.7*  --   NEUTROABS 10.7*  --   HGB 14.1 15.3*  HCT 41.8 45.0  MCV 82.3  --   PLT 239  --    Cardiac Enzymes:  Recent Labs Lab 12/07/12 1402  TROPONINI <0.30   BNP: BNP (last 3 results) No results found for this basename: PROBNP,  in the last 8760 hours CBG:  Recent Labs Lab 12/08/12 1151 12/08/12 1605 12/08/12 2214 12/09/12 0638 12/09/12 1135  GLUCAP 100* 194* 134* 105* 74   I spent 33 mins preparing discharge, reviewing records, counseling patient and family, reconciling meds  Signed:  Clanford Johnson  [facility] 12/09/2012, 12:16 PM

## 2012-12-12 ENCOUNTER — Encounter (INDEPENDENT_AMBULATORY_CARE_PROVIDER_SITE_OTHER): Payer: Self-pay

## 2012-12-24 ENCOUNTER — Emergency Department (HOSPITAL_COMMUNITY): Payer: BC Managed Care – PPO

## 2012-12-24 ENCOUNTER — Encounter (HOSPITAL_COMMUNITY): Payer: Self-pay | Admitting: Emergency Medicine

## 2012-12-24 ENCOUNTER — Emergency Department (HOSPITAL_COMMUNITY)
Admission: EM | Admit: 2012-12-24 | Discharge: 2012-12-24 | Disposition: A | Discharge status: Home / Self Care | Payer: BC Managed Care – PPO | Attending: Emergency Medicine | Admitting: Emergency Medicine

## 2012-12-24 DIAGNOSIS — E1149 Type 2 diabetes mellitus with other diabetic neurological complication: Secondary | ICD-10-CM | POA: Insufficient documentation

## 2012-12-24 DIAGNOSIS — Y929 Unspecified place or not applicable: Secondary | ICD-10-CM | POA: Insufficient documentation

## 2012-12-24 DIAGNOSIS — Z8639 Personal history of other endocrine, nutritional and metabolic disease: Secondary | ICD-10-CM | POA: Insufficient documentation

## 2012-12-24 DIAGNOSIS — S80211A Abrasion, right knee, initial encounter: Secondary | ICD-10-CM

## 2012-12-24 DIAGNOSIS — K219 Gastro-esophageal reflux disease without esophagitis: Secondary | ICD-10-CM | POA: Insufficient documentation

## 2012-12-24 DIAGNOSIS — Z880 Allergy status to penicillin: Secondary | ICD-10-CM | POA: Insufficient documentation

## 2012-12-24 DIAGNOSIS — R011 Cardiac murmur, unspecified: Secondary | ICD-10-CM | POA: Insufficient documentation

## 2012-12-24 DIAGNOSIS — W108XXA Fall (on) (from) other stairs and steps, initial encounter: Secondary | ICD-10-CM | POA: Insufficient documentation

## 2012-12-24 DIAGNOSIS — Z8669 Personal history of other diseases of the nervous system and sense organs: Secondary | ICD-10-CM | POA: Insufficient documentation

## 2012-12-24 DIAGNOSIS — Y939 Activity, unspecified: Secondary | ICD-10-CM | POA: Insufficient documentation

## 2012-12-24 DIAGNOSIS — S0990XA Unspecified injury of head, initial encounter: Secondary | ICD-10-CM | POA: Insufficient documentation

## 2012-12-24 DIAGNOSIS — E1142 Type 2 diabetes mellitus with diabetic polyneuropathy: Secondary | ICD-10-CM | POA: Insufficient documentation

## 2012-12-24 DIAGNOSIS — W19XXXA Unspecified fall, initial encounter: Secondary | ICD-10-CM

## 2012-12-24 DIAGNOSIS — S0993XA Unspecified injury of face, initial encounter: Secondary | ICD-10-CM | POA: Insufficient documentation

## 2012-12-24 DIAGNOSIS — Z8739 Personal history of other diseases of the musculoskeletal system and connective tissue: Secondary | ICD-10-CM | POA: Insufficient documentation

## 2012-12-24 DIAGNOSIS — F329 Major depressive disorder, single episode, unspecified: Secondary | ICD-10-CM | POA: Insufficient documentation

## 2012-12-24 DIAGNOSIS — F3289 Other specified depressive episodes: Secondary | ICD-10-CM | POA: Insufficient documentation

## 2012-12-24 DIAGNOSIS — Z862 Personal history of diseases of the blood and blood-forming organs and certain disorders involving the immune mechanism: Secondary | ICD-10-CM | POA: Insufficient documentation

## 2012-12-24 DIAGNOSIS — F411 Generalized anxiety disorder: Secondary | ICD-10-CM | POA: Insufficient documentation

## 2012-12-24 DIAGNOSIS — S90512A Abrasion, left ankle, initial encounter: Secondary | ICD-10-CM

## 2012-12-24 DIAGNOSIS — IMO0002 Reserved for concepts with insufficient information to code with codable children: Secondary | ICD-10-CM | POA: Insufficient documentation

## 2012-12-24 DIAGNOSIS — Z3202 Encounter for pregnancy test, result negative: Secondary | ICD-10-CM | POA: Insufficient documentation

## 2012-12-24 DIAGNOSIS — Z79899 Other long term (current) drug therapy: Secondary | ICD-10-CM | POA: Insufficient documentation

## 2012-12-24 LAB — URINALYSIS, ROUTINE W REFLEX MICROSCOPIC
Bilirubin Urine: NEGATIVE
Glucose, UA: NEGATIVE mg/dL
Hgb urine dipstick: NEGATIVE
Leukocytes, UA: NEGATIVE
Protein, ur: NEGATIVE mg/dL
Specific Gravity, Urine: 1.01 (ref 1.005–1.030)
Urobilinogen, UA: 0.2 mg/dL (ref 0.0–1.0)

## 2012-12-24 LAB — RAPID URINE DRUG SCREEN, HOSP PERFORMED
Benzodiazepines: NOT DETECTED
Cocaine: NOT DETECTED
Opiates: NOT DETECTED

## 2012-12-24 LAB — CBC WITH DIFFERENTIAL/PLATELET
Eosinophils Relative: 1 % (ref 0–5)
HCT: 43.7 % (ref 36.0–46.0)
Lymphocytes Relative: 30 % (ref 12–46)
Lymphs Abs: 2.9 10*3/uL (ref 0.7–4.0)
MCH: 27.8 pg (ref 26.0–34.0)
MCV: 83.7 fL (ref 78.0–100.0)
Monocytes Absolute: 0.6 10*3/uL (ref 0.1–1.0)
Monocytes Relative: 6 % (ref 3–12)
RBC: 5.22 MIL/uL — ABNORMAL HIGH (ref 3.87–5.11)
WBC: 9.8 10*3/uL (ref 4.0–10.5)

## 2012-12-24 LAB — ETHANOL: Alcohol, Ethyl (B): <11 mg/dL (ref 0–11)

## 2012-12-24 LAB — COMPREHENSIVE METABOLIC PANEL
ALT: 14 U/L (ref 0–35)
BUN: 8 mg/dL (ref 6–23)
CO2: 28 mEq/L (ref 19–32)
Calcium: 9.5 mg/dL (ref 8.4–10.5)
Creatinine, Ser: 0.62 mg/dL (ref 0.50–1.10)
GFR calc Af Amer: >90 mL/min (ref >90)
GFR calc non Af Amer: >90 mL/min (ref >90)
Glucose, Bld: 148 mg/dL — ABNORMAL HIGH (ref 70–99)

## 2012-12-24 LAB — PREGNANCY, URINE: Preg Test, Ur: NEGATIVE

## 2012-12-24 MED ORDER — SODIUM CHLORIDE 0.9 % IV SOLN
1000.0000 mL | INTRAVENOUS | Status: DC
  Administered 2012-12-24: 1000 mL via INTRAVENOUS

## 2012-12-24 MED ORDER — SODIUM CHLORIDE 0.9 % IV SOLN
1000.0000 mL | Freq: Once | INTRAVENOUS | Status: AC
  Administered 2012-12-24: 1000 mL via INTRAVENOUS

## 2012-12-24 MED ORDER — PROMETHAZINE HCL 25 MG/ML IJ SOLN
12.5000 mg | Freq: Once | INTRAMUSCULAR | Status: AC
  Administered 2012-12-24: 12.5 mg via INTRAVENOUS
  Filled 2012-12-24: qty 1

## 2012-12-24 NOTE — ED Notes (Signed)
Patient transported to CT 

## 2012-12-24 NOTE — ED Notes (Addendum)
Per EMS patient took a Dilaudid at about 1230 for chronic back pain, later fell down on the stairs, did not fall all the way to the ground, denies head injury, no loss of consciousness. Per EMS patient's pupils are dilated and speech is slurred, patient complains of back and leg pain.

## 2012-12-24 NOTE — ED Provider Notes (Signed)
CSN: [id number]     Arrival date & time 12/24/12  1452 History   First MD Initiated Contact with Patient 12/24/12 1508     Chief Complaint  Patient presents with  . Fall   (Consider location/radiation/quality/duration/timing/severity/associated sxs/prior Treatment) HPI [name] is a 36 year old female who takes Dilaudid and Xanax for chronic back pain pain who comes in today after falling down 4 steps. She was immobilized by EMS with a cervical collar and is on long spine board. She is unclear about exactly why she fell. She states that she has pain in her head from hitting it. She does not know if she had loss of consciousness. She states that her neck is painful but has not clear as to whether or not this preceded the fall, occurred during the fall, or is secondary to the cervical collar. She has low back pain but states that she always has low back pain. Past Medical History  Diagnosis Date  . Depression   . Anxiety   . Diabetes mellitus   . GERD (gastroesophageal reflux disease)   . ADD (attention deficit disorder)   . Hyperlipemia   . Herniated disc   . Allergy     Seasonal  . Diabetic neuropathy   . Shortness of breath     with exertion   . Sleep apnea     obsructive settings at 13   . Heart murmur     asa child  . Seizures     hx of / month ago - dehydration   . 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 Degenerative disc disease   . Annular disc tear   . Retrolisthesis     L4 and L5  . Bulging disc    Past Surgical History  Procedure Laterality Date  . Cholecystectomy    . Cesarean section    . Tonsillectomy    . Laporoscopy  2007  . Breath tek h pylori  09/11/2011    Procedure: BREATH TEK H PYLORI;  Surgeon: Mariella Saa, MD;  Location: [facility];  Service: General;  Laterality: N/A;  . 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 Bowel resection  11/30/2011    Procedure: SMALL BOWEL RESECTION;  Surgeon: Mariella Saa, MD;  Location: [facility];  Service: General;;  . Esophagogastroduodenoscopy  03/14/2012    Procedure: ESOPHAGOGASTRODUODENOSCOPY (EGD);  Surgeon: Kandis Cocking, MD;  Location: [facility];  Service: General;  Laterality: N/A;  . Esophagogastroduodenoscopy  04/25/2012    Procedure: ESOPHAGOGASTRODUODENOSCOPY (EGD);  Surgeon: Kandis Cocking, MD;  Location: [facility];  Service: General;  Laterality: N/A;   Family History  Problem Relation Age of Onset  . Diabetes type II    . Diabetes Mother   . Hypertension Mother   . Depression Brother   . Cancer Maternal Aunt     ovarian   . Depression Maternal Aunt   . Diabetes Maternal Aunt   . Cancer Maternal Grandmother     Lung  . COPD Maternal Grandmother   . Cancer Maternal Grandfather     Prostate  . Diabetes Maternal Grandfather   . Hearing loss Maternal Grandfather   . Hypertension Maternal Grandfather   . Stroke Maternal Grandfather   . Cancer Paternal Grandmother     Colon  History  Substance Use Topics  . Smoking status: Never Smoker   . Smokeless tobacco: Never Used  . Alcohol Use: No   OB History   Grav Para Term Preterm Abortions TAB SAB Ect Mult Living                 Review of Systems  All other systems reviewed and are negative.    Allergies  Ace inhibitors; Atarax; Cephalosporins; Codeine; Compazine; Decadron; Demerol; Metoclopramide hcl; Morphine and related; Other; Penicillins; Pork-derived products; Prozac; Shellfish-derived products; Sulfa antibiotics; Triptans; Zanaflex; Zofran; and Dihydroergotamine  Home Medications   Current Outpatient Rx  Name  Route  Sig  Dispense  Refill  . chlorzoxazone (PARAFON FORTE DSC) 500 MG tablet      500 mg. Take 1 tablet (500 mg total) by mouth 4 (four) times a day as needed for Muscle spasms.         . cyproheptadine  (PERIACTIN) 2 MG/5ML syrup      Take 46mls(8mg  total) at bedtime         . OLANZapine (ZYPREXA) 2.5 MG tablet      TAKE 1 TABLET AS NEEDED FOR HEADACHE RESCUE UP TO 3 TIMES A DAY         . promethazine (PHENERGAN) 6.25 MG/5ML syrup      TAKE 20 MLS (25 MG TOTAL) BY MOUTH 4 (FOUR) TIMES A DAY AS NEEDED FOR NAUSEA.         Marland Kitchen ALPRAZolam (XANAX) 1 MG tablet   Oral   Take 1 tablet (1 mg total) by mouth at bedtime as needed for anxiety. Do not mix with narcotic medication or alcohol.   30 tablet   0   . ALPRAZolam (XANAX) 1 MG tablet      1 mg. Take 1 mg by mouth at bedtime as needed.         . bisacodyl (DULCOLAX) 5 MG EC tablet   Oral   Take 5 mg by mouth daily as needed for constipation.         Marland Kitchen CALCIUM-VITAMIN D PO   Oral   Take 3 tablets by mouth daily.         . Cyanocobalamin (VITAMIN B-12) 2500 MCG SUBL   Sublingual   Place 2,500 mcg under the tongue daily.         . cyproheptadine (PERIACTIN) 2 MG/5ML syrup   Oral   Take 8 mg by mouth at bedtime.         Marland Kitchen doxepin (SINEQUAN) 50 MG capsule               . HYDROmorphone (DILAUDID) 4 MG tablet   Oral   Take 4 mg by mouth 3 (three) times daily as needed for pain.         . Multiple Vitamin (MULTIVITAMIN WITH MINERALS) TABS tablet   Oral   Take 2 tablets by mouth daily.         Marland Kitchen OLANZapine (ZYPREXA) 2.5 MG tablet   Oral   Take 2.5 mg by mouth 3 (three) times daily as needed (for hemiplegic migraines).         . OnabotulinumtoxinA (BOTOX IJ)   Injection   Inject 1 each as directed every 3 (three) months. For migraines         . pantoprazole (PROTONIX) 40 MG tablet   Oral   Take 1 tablet (40 mg total) by mouth daily.   60 tablet   2   .  promethazine (PHENERGAN) 25 MG/ML injection   Intramuscular   Inject 50 mg into the muscle once. For hemiplegic migraines         . senna (SENOKOT) 8.6 MG tablet   Oral   Take 1 tablet by mouth daily.         Marland Kitchen tiZANidine (ZANAFLEX) 4  MG tablet                BP 100/56  Pulse 69  Temp(Src) 97.7 F (36.5 C) (Oral)  Resp 21  SpO2 99% Physical Exam  Nursing note and vitals reviewed. Constitutional: She is oriented to person, place, and time. She appears well-developed and well-nourished.  Pale  HENT:  Head: Normocephalic and atraumatic.  Right Ear: External ear normal.  Left Ear: External ear normal.  Nose: Nose normal.  Mouth/Throat: Oropharynx is clear and moist.  Eyes: Conjunctivae and EOM are normal. Pupils are equal, round, and reactive to light.  Neck: Normal range of motion. Neck supple.  Cardiovascular: Normal rate, regular rhythm and normal heart sounds.   Pulmonary/Chest: Effort normal and breath sounds normal.  Abdominal: Soft. Bowel sounds are normal.  Musculoskeletal:  Abrasion right knee x2 Abrasion left lateral ankle She has some tenderness to palpation over both of these abrasions. Full active range of motion of bilateral hips knees and ankles with dorsal pedalis pulses intact.  Neurological: She is alert and oriented to person, place, and time.  Reflex Scores:      Bicep reflexes are 1+ on the right side and 1+ on the left side.      Patellar reflexes are 2+ on the right side and 2+ on the left side. Patient is awake but appears to be drowsy. She has some slurring of her words and is slow to respond and answer accurately.  Skin:     Psychiatric: Her speech is slurred. She is slowed. Cognition and memory are impaired. She expresses no homicidal and no suicidal ideation.    ED Course  Procedures (including critical care time) Labs Review Labs Reviewed  CBC WITH DIFFERENTIAL - Abnormal; Notable for the following:    RBC 5.22 (*)    All other components within normal limits  COMPREHENSIVE METABOLIC PANEL - Abnormal; Notable for the following:    Glucose, Bld 148 (*)    All other components within normal limits  ETHANOL  URINALYSIS, ROUTINE W REFLEX MICROSCOPIC  PREGNANCY, URINE    URINE RAPID DRUG SCREEN (HOSP PERFORMED)   Imaging Review Dg Lumbar Spine Complete  12/24/2012   CLINICAL DATA:  Recent traumatic injury with back pain  EXAM: LUMBAR SPINE - COMPLETE 4+ VIEW  COMPARISON:  None.  FINDINGS: There is no evidence of lumbar spine fracture. Alignment is normal. Intervertebral disc spaces are maintained.  IMPRESSION: No acute abnormality noted.   Electronically Signed   By: Alcide Clever M.D.   On: 12/24/2012 16:28   Dg Pelvis 1-2 Views  12/24/2012   CLINICAL DATA:  Recent traumatic injury with pain  EXAM: PELVIS - 1-2 VIEW  COMPARISON:  None.  FINDINGS: There is no evidence of pelvic fracture or diastasis. No other pelvic bone lesions are seen. An IUD is noted.  IMPRESSION: No acute abnormality noted.   Electronically Signed   By: Alcide Clever M.D.   On: 12/24/2012 16:28   Dg Ankle Complete Left  12/24/2012   CLINICAL DATA:  Recent traumatic injury with pain  EXAM: LEFT ANKLE COMPLETE - 3+ VIEW  COMPARISON:  None.  FINDINGS:  There is no evidence of fracture, dislocation, or joint effusion. Small calcaneal spurs are noted. . Soft tissues are unremarkable.  IMPRESSION: No acute abnormality noted.   Electronically Signed   By: Alcide Clever M.D.   On: 12/24/2012 16:29   Ct Head Wo Contrast  12/24/2012   CLINICAL DATA:  Recent traumatic injury with pain  EXAM: CT HEAD WITHOUT CONTRAST  CT CERVICAL SPINE WITHOUT CONTRAST  TECHNIQUE: Multidetector CT imaging of the head and cervical spine was performed following the standard protocol without intravenous contrast. Multiplanar CT image reconstructions of the cervical spine were also generated.  COMPARISON:  12/07/2012  FINDINGS: CT HEAD FINDINGS  The bony calvarium is intact. No gross soft tissue abnormality is noted. No acute hemorrhage, acute infarction or space-occupying mass lesion is noted.  CT CERVICAL SPINE FINDINGS  Seven cervical segments are well visualized. Vertebral body height is well maintained. No acute fracture or  acute facet abnormality is noted. The surrounding soft tissues are within normal limits. The visualized lung apices are unremarkable.  IMPRESSION: CT HEAD IMPRESSION  No acute abnormality noted.  CT CERVICAL SPINE IMPRESSION  No acute abnormality noted.   Electronically Signed   By: Alcide Clever M.D.   On: 12/24/2012 16:04   Ct Cervical Spine Wo Contrast  12/24/2012   CLINICAL DATA:  Recent traumatic injury with pain  EXAM: CT HEAD WITHOUT CONTRAST  CT CERVICAL SPINE WITHOUT CONTRAST  TECHNIQUE: Multidetector CT imaging of the head and cervical spine was performed following the standard protocol without intravenous contrast. Multiplanar CT image reconstructions of the cervical spine were also generated.  COMPARISON:  12/07/2012  FINDINGS: CT HEAD FINDINGS  The bony calvarium is intact. No gross soft tissue abnormality is noted. No acute hemorrhage, acute infarction or space-occupying mass lesion is noted.  CT CERVICAL SPINE FINDINGS  Seven cervical segments are well visualized. Vertebral body height is well maintained. No acute fracture or acute facet abnormality is noted. The surrounding soft tissues are within normal limits. The visualized lung apices are unremarkable.  IMPRESSION: CT HEAD IMPRESSION  No acute abnormality noted.  CT CERVICAL SPINE IMPRESSION  No acute abnormality noted.   Electronically Signed   By: Alcide Clever M.D.   On: 12/24/2012 16:04   Dg Knee Complete 4 Views Right  12/24/2012   CLINICAL DATA:  Recent traumatic injury with pain  EXAM: RIGHT KNEE - COMPLETE 4+ VIEW  COMPARISON:  None.  FINDINGS: There is no evidence of fracture, dislocation, or joint effusion. There is no evidence of arthropathy or other focal bone abnormality. Soft tissues are unremarkable.  IMPRESSION: No acute abnormality noted.   Electronically Signed   By: Alcide Clever M.D.   On: 12/24/2012 16:29   Results for orders placed during the hospital encounter of 12/24/12  CBC WITH DIFFERENTIAL      Result Value  Range   WBC 9.8  4.0 - 10.5 K/uL   RBC 5.22 (*) 3.87 - 5.11 MIL/uL   Hemoglobin 14.5  12.0 - 15.0 g/dL   HCT 82.9  56.2 - 13.0 %   MCV 83.7  78.0 - 100.0 fL   MCH 27.8  26.0 - 34.0 pg   MCHC 33.2  30.0 - 36.0 g/dL   RDW 86.5  78.4 - 69.6 %   Platelets 208  150 - 400 K/uL   Neutrophils Relative % 63  43 - 77 %   Neutro Abs 6.2  1.7 - 7.7 K/uL   Lymphocytes Relative 30  12 - 46 %   Lymphs Abs 2.9  0.7 - 4.0 K/uL   Monocytes Relative 6  3 - 12 %   Monocytes Absolute 0.6  0.1 - 1.0 K/uL   Eosinophils Relative 1  0 - 5 %   Eosinophils Absolute 0.1  0.0 - 0.7 K/uL   Basophils Relative 0  0 - 1 %   Basophils Absolute 0.0  0.0 - 0.1 K/uL  COMPREHENSIVE METABOLIC PANEL      Result Value Range   Sodium 135  135 - 145 mEq/L   Potassium 3.7  3.5 - 5.1 mEq/L   Chloride 96  96 - 112 mEq/L   CO2 28  19 - 32 mEq/L   Glucose, Bld 148 (*) 70 - 99 mg/dL   BUN 8  6 - 23 mg/dL   Creatinine, Ser 2.84  0.50 - 1.10 mg/dL   Calcium 9.5  8.4 - 13.2 mg/dL   Total Protein 7.7  6.0 - 8.3 g/dL   Albumin 4.3  3.5 - 5.2 g/dL   AST 17  0 - 37 U/L   ALT 14  0 - 35 U/L   Alkaline Phosphatase 78  39 - 117 U/L   Total Bilirubin 0.4  0.3 - 1.2 mg/dL   GFR calc non Af Amer >90  >90 mL/min   GFR calc Af Amer >90  >90 mL/min  URINE RAPID DRUG SCREEN (HOSP PERFORMED)      Result Value Range   Opiates NONE DETECTED  NONE DETECTED   Cocaine NONE DETECTED  NONE DETECTED   Benzodiazepines NONE DETECTED  NONE DETECTED   Amphetamines NONE DETECTED  NONE DETECTED   Tetrahydrocannabinol NONE DETECTED  NONE DETECTED   Barbiturates NONE DETECTED  NONE DETECTED  ETHANOL      Result Value Range   Alcohol, Ethyl (B) <11  0 - 11 mg/dL  URINALYSIS, ROUTINE W REFLEX MICROSCOPIC      Result Value Range   Color, Urine YELLOW  YELLOW   APPearance CLEAR  CLEAR   Specific Gravity, Urine 1.010  1.005 - 1.030   pH 6.5  5.0 - 8.0   Glucose, UA NEGATIVE  NEGATIVE mg/dL   Hgb urine dipstick NEGATIVE  NEGATIVE   Bilirubin Urine  NEGATIVE  NEGATIVE   Ketones, ur NEGATIVE  NEGATIVE mg/dL   Protein, ur NEGATIVE  NEGATIVE mg/dL   Urobilinogen, UA 0.2  0.0 - 1.0 mg/dL   Nitrite NEGATIVE  NEGATIVE   Leukocytes, UA NEGATIVE  NEGATIVE  PREGNANCY, URINE      Result Value Range   Preg Test, Ur NEGATIVE  NEGATIVE    MDM  No diagnosis found. Patient was initially hypotensive with blood pressure in the 80s. She is given 2 L of normal saline and her systolic blood pressure has increased to 105. CT of the head and neck were obtained do not show any acute abnormalities. X-Dorothye Berni of the right knee left ankle and pelvis obtained do not show any acute abnormality. Patient had her cervical collar removed by me. She has no focal neurological deficits and does not appear any point tenderness on her cervical spine exam. Patient does appear to be over medicated likely secondary to the Dilaudid and Xanax she takes on a regular basis. She has requested pain medicine here but due to her multiple allergies, and my concerns regarding her depressed status do to narcotics, she is not given any additional pain medicine. She states she was nauseated and cannot tolerate any antibiotics  besides Phenergan. She is given 12.5 mg of Phenergan. She has not vomited here.  She'll be ambulated to assure there does not appear to be any occult fractures and that her blood pressure remained adequate on standing. We'll also attempt to locate family said that she can be discharged to home in a secure environment and will make sure that her medications are dispensed appropriately.  Patient ambulated without difficulty. She is advised regarding probable oversedation and fall likely related to her sedating medications.    Hilario Quarry, MD 12/24/12 [phone]

## 2012-12-24 NOTE — ED Notes (Signed)
Bed: WA10 Expected date: 12/24/12 Expected time: 2:49 PM Means of arrival: Ambulance Comments: Fall/ LSB/ Chronic back pain

## 2012-12-26 ENCOUNTER — Encounter (HOSPITAL_COMMUNITY): Payer: Self-pay

## 2012-12-26 ENCOUNTER — Emergency Department (HOSPITAL_COMMUNITY)
Admission: EM | Admit: 2012-12-26 | Discharge: 2012-12-26 | Disposition: A | Discharge status: Home / Self Care | Payer: BC Managed Care – PPO | Attending: Emergency Medicine | Admitting: Emergency Medicine

## 2012-12-26 DIAGNOSIS — F411 Generalized anxiety disorder: Secondary | ICD-10-CM | POA: Insufficient documentation

## 2012-12-26 DIAGNOSIS — R011 Cardiac murmur, unspecified: Secondary | ICD-10-CM | POA: Insufficient documentation

## 2012-12-26 DIAGNOSIS — H6690 Otitis media, unspecified, unspecified ear: Secondary | ICD-10-CM | POA: Insufficient documentation

## 2012-12-26 DIAGNOSIS — H8110 Benign paroxysmal vertigo, unspecified ear: Secondary | ICD-10-CM

## 2012-12-26 DIAGNOSIS — Z862 Personal history of diseases of the blood and blood-forming organs and certain disorders involving the immune mechanism: Secondary | ICD-10-CM | POA: Insufficient documentation

## 2012-12-26 DIAGNOSIS — G8929 Other chronic pain: Secondary | ICD-10-CM | POA: Insufficient documentation

## 2012-12-26 DIAGNOSIS — R110 Nausea: Secondary | ICD-10-CM | POA: Insufficient documentation

## 2012-12-26 DIAGNOSIS — H6692 Otitis media, unspecified, left ear: Secondary | ICD-10-CM

## 2012-12-26 DIAGNOSIS — G8911 Acute pain due to trauma: Secondary | ICD-10-CM | POA: Insufficient documentation

## 2012-12-26 DIAGNOSIS — E1142 Type 2 diabetes mellitus with diabetic polyneuropathy: Secondary | ICD-10-CM | POA: Insufficient documentation

## 2012-12-26 DIAGNOSIS — Z79899 Other long term (current) drug therapy: Secondary | ICD-10-CM | POA: Insufficient documentation

## 2012-12-26 DIAGNOSIS — K219 Gastro-esophageal reflux disease without esophagitis: Secondary | ICD-10-CM | POA: Insufficient documentation

## 2012-12-26 DIAGNOSIS — F329 Major depressive disorder, single episode, unspecified: Secondary | ICD-10-CM | POA: Insufficient documentation

## 2012-12-26 DIAGNOSIS — M542 Cervicalgia: Secondary | ICD-10-CM | POA: Insufficient documentation

## 2012-12-26 DIAGNOSIS — E1149 Type 2 diabetes mellitus with other diabetic neurological complication: Secondary | ICD-10-CM | POA: Insufficient documentation

## 2012-12-26 DIAGNOSIS — Z880 Allergy status to penicillin: Secondary | ICD-10-CM | POA: Insufficient documentation

## 2012-12-26 DIAGNOSIS — Z8639 Personal history of other endocrine, nutritional and metabolic disease: Secondary | ICD-10-CM | POA: Insufficient documentation

## 2012-12-26 DIAGNOSIS — F3289 Other specified depressive episodes: Secondary | ICD-10-CM | POA: Insufficient documentation

## 2012-12-26 DIAGNOSIS — Z792 Long term (current) use of antibiotics: Secondary | ICD-10-CM | POA: Insufficient documentation

## 2012-12-26 DIAGNOSIS — R51 Headache: Secondary | ICD-10-CM | POA: Insufficient documentation

## 2012-12-26 DIAGNOSIS — G43909 Migraine, unspecified, not intractable, without status migrainosus: Secondary | ICD-10-CM | POA: Insufficient documentation

## 2012-12-26 LAB — RAPID URINE DRUG SCREEN, HOSP PERFORMED
Amphetamines: NOT DETECTED
Barbiturates: NOT DETECTED
Opiates: NOT DETECTED

## 2012-12-26 MED ORDER — AMOXICILLIN 500 MG PO CAPS: 500.0000 mg | ORAL_CAPSULE | Freq: Three times a day (TID) | ORAL | Status: DC

## 2012-12-26 MED ORDER — LORAZEPAM 1 MG PO TABS
1.0000 mg | ORAL_TABLET | Freq: Once | ORAL | Status: AC
  Administered 2012-12-26: 1 mg via ORAL
  Filled 2012-12-26: qty 1

## 2012-12-26 MED ORDER — HYDROMORPHONE HCL PF 1 MG/ML IJ SOLN
1.0000 mg | Freq: Once | INTRAMUSCULAR | Status: AC
  Administered 2012-12-26: 1 mg via INTRAMUSCULAR
  Filled 2012-12-26: qty 1

## 2012-12-26 MED ORDER — SODIUM CHLORIDE 0.9 % IV BOLUS (SEPSIS)
1000.0000 mL | Freq: Once | INTRAVENOUS | Status: AC
  Administered 2012-12-26: 1000 mL via INTRAVENOUS

## 2012-12-26 MED ORDER — MECLIZINE HCL 25 MG PO TABS: 25.0000 mg | ORAL_TABLET | Freq: Three times a day (TID) | ORAL | Status: DC | PRN

## 2012-12-26 MED ORDER — ANTIPYRINE-BENZOCAINE 5.4-1.4 % OT SOLN: 3.0000 [drp] | OTIC | Status: DC | PRN

## 2012-12-26 MED ORDER — MECLIZINE HCL 25 MG PO TABS
25.0000 mg | ORAL_TABLET | Freq: Once | ORAL | Status: AC
  Administered 2012-12-26: 25 mg via ORAL
  Filled 2012-12-26: qty 1

## 2012-12-26 MED ORDER — AMOXICILLIN 250 MG/5ML PO SUSR: 500.0000 mg | Freq: Two times a day (BID) | ORAL | Status: DC

## 2012-12-26 MED ORDER — PROMETHAZINE HCL 25 MG/ML IJ SOLN
25.0000 mg | Freq: Once | INTRAMUSCULAR | Status: AC
  Administered 2012-12-26: 25 mg via INTRAMUSCULAR
  Filled 2012-12-26: qty 1

## 2012-12-26 MED ORDER — DIAZEPAM 5 MG/ML IJ SOLN
5.0000 mg | Freq: Once | INTRAMUSCULAR | Status: AC
Start: 2012-12-26 — End: 2012-12-26
  Administered 2012-12-26: 5 mg via INTRAVENOUS
  Filled 2012-12-26: qty 2

## 2012-12-26 NOTE — ED Notes (Signed)
Pt ambulated in hall for 50 feet and reported feeling light headed.

## 2012-12-26 NOTE — ED Provider Notes (Signed)
CSN: [id number]     Arrival date & time 12/26/12  1303 History   First MD Initiated Contact with Patient 12/26/12 1720     Chief Complaint  Patient presents with  . Dizziness  . Nausea   (Consider location/radiation/quality/duration/timing/severity/associated sxs/prior Treatment) The history is provided by the patient and medical records. No language interpreter was used.    [name] is a 37 y.o. female  with a hx of chronic back pain on Dilaudid and Xanax presents to the Emergency Department complaining of persistent, progressively worsening dizziness with associated nausea without vomiting beginning 12/24/2012 after falling down 4 steps. The reason for her fall was unknown and today she endorses a positive loss of consciousness on the day.  She reports that she has a persistent headache without vision changes.  She reports the pain as throbbing and different from her migraines. She localizes it to the back of her head where she has a "knot" where she hit her head. She also endorses associated dizziness complaining that the room is spinning and denies lightheadedness.  She reports she has been able to walk, [place] I[employer] sharp and drive her car today but is currently unable to do these things because of her dizziness.  Pt denies fever, chills, chest pain, shortness of breath, abdominal pain, vomiting, diarrhea, focal weakness, vision changes, further syncope, dysuria, hematuria.     Past Medical History  Diagnosis Date  . Depression   . Anxiety   . Diabetes mellitus   . GERD (gastroesophageal reflux disease)   . ADD (attention deficit disorder)   . Hyperlipemia   . Herniated disc   . Allergy     Seasonal  . Diabetic neuropathy   . Shortness of breath     with exertion   . Sleep apnea     obsructive settings at 13   . Heart murmur     asa child  . Seizures     hx of / month ago - dehydration   . 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 Degenerative disc disease   . Annular disc tear   . Retrolisthesis     L4 and L5  . Bulging disc    Past Surgical History  Procedure Laterality Date  . Cholecystectomy    . Cesarean section    . Tonsillectomy    . Laporoscopy  2007  . Breath tek h pylori  09/11/2011    Procedure: BREATH TEK H PYLORI;  Surgeon: Mariella Saa, MD;  Location: [facility];  Service: General;  Laterality: N/A;  . 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 Bowel resection  11/30/2011    Procedure: SMALL BOWEL RESECTION;  Surgeon: Mariella Saa, MD;  Location: [facility];  Service: General;;  . Esophagogastroduodenoscopy  03/14/2012    Procedure: ESOPHAGOGASTRODUODENOSCOPY (EGD);  Surgeon: Kandis Cocking, MD;  Location: [facility];  Service: General;  Laterality: N/A;  . Esophagogastroduodenoscopy  04/25/2012    Procedure: ESOPHAGOGASTRODUODENOSCOPY (EGD);  Surgeon: Kandis Cocking, MD;  Location: [facility];  Service: General;  Laterality: N/A;   Family History  Problem Relation Age of Onset  . Diabetes type II    . Diabetes Mother   . Hypertension Mother   . Depression Brother   . Cancer Maternal Aunt  ovarian   . Depression Maternal Aunt   . Diabetes Maternal Aunt   . Cancer Maternal Grandmother     Lung  . COPD Maternal Grandmother   . Cancer Maternal Grandfather     Prostate  . Diabetes Maternal Grandfather   . Hearing loss Maternal Grandfather   . Hypertension Maternal Grandfather   . Stroke Maternal Grandfather   . Cancer Paternal Grandmother     Colon   History  Substance Use Topics  . Smoking status: Never Smoker   . Smokeless tobacco: Never Used  . Alcohol Use: No   OB History   Grav Para Term Preterm Abortions TAB SAB Ect Mult Living                 Review of Systems  Constitutional: Negative for fever, chills, diaphoresis,  appetite change, fatigue and unexpected weight change.  HENT: Positive for ear pain and neck pain. Negative for mouth sores and neck stiffness.   Eyes: Negative for visual disturbance.  Respiratory: Negative for cough, chest tightness, shortness of breath and wheezing.   Cardiovascular: Negative for chest pain.  Gastrointestinal: Positive for nausea. Negative for vomiting, abdominal pain, diarrhea and constipation.  Endocrine: Negative for polydipsia, polyphagia and polyuria.  Genitourinary: Negative for dysuria, urgency, frequency and hematuria.  Musculoskeletal: Negative for back pain.  Skin: Negative for rash.  Allergic/Immunologic: Negative for immunocompromised state.  Neurological: Positive for dizziness and headaches. Negative for syncope and light-headedness.  Hematological: Does not bruise/bleed easily.  Psychiatric/Behavioral: Negative for sleep disturbance. The patient is nervous/anxious.     Allergies  Ace inhibitors; Atarax; Cephalosporins; Codeine; Compazine; Decadron; Demerol; Metoclopramide hcl; Morphine and related; Other; Penicillins; Pork-derived products; Prozac; Shellfish-derived products; Sulfa antibiotics; Triptans; Zanaflex; Zofran; and Dihydroergotamine  Home Medications   Current Outpatient Rx  Name  Route  Sig  Dispense  Refill  . ALPRAZolam (XANAX) 1 MG tablet   Oral   Take 1 tablet (1 mg total) by mouth at bedtime as needed for anxiety. Do not mix with narcotic medication or alcohol.   30 tablet   0   . bisacodyl (DULCOLAX) 5 MG EC tablet   Oral   Take 5 mg by mouth daily as needed for constipation.         Marland Kitchen CALCIUM-VITAMIN D PO   Oral   Take 3 tablets by mouth daily.         . Cyanocobalamin (VITAMIN B-12) 2500 MCG SUBL   Sublingual   Place 2,500 mcg under the tongue daily.         . cyproheptadine (PERIACTIN) 2 MG/5ML syrup   Oral   Take 8 mg by mouth at bedtime.         Marland Kitchen HYDROmorphone (DILAUDID) 4 MG tablet   Oral   Take 4 mg by  mouth 3 (three) times daily as needed for pain.         . Multiple Vitamin (MULTIVITAMIN WITH MINERALS) TABS tablet   Oral   Take 2 tablets by mouth daily.         Marland Kitchen OLANZapine (ZYPREXA) 2.5 MG tablet   Oral   Take 2.5 mg by mouth 3 (three) times daily as needed (for hemiplegic migraines).         . OnabotulinumtoxinA (BOTOX IJ)   Injection   Inject 1 each as directed every 3 (three) months. For migraines         . pantoprazole (PROTONIX) 40 MG tablet   Oral   Take  1 tablet (40 mg total) by mouth daily.   60 tablet   2   . promethazine (PHENERGAN) 25 MG/ML injection   Intramuscular   Inject 50 mg into the muscle once. For hemiplegic migraines         . promethazine (PHENERGAN) 6.25 MG/5ML syrup      TAKE 20 MLS (25 MG TOTAL) BY MOUTH 4 (FOUR) TIMES A DAY AS NEEDED FOR NAUSEA.         Marland Kitchen senna (SENOKOT) 8.6 MG tablet   Oral   Take 1 tablet by mouth daily.         Marland Kitchen amoxicillin (AMOXIL) 500 MG capsule   Oral   Take 1 capsule (500 mg total) by mouth 3 (three) times daily.   21 capsule   0   . antipyrine-benzocaine (AURALGAN) otic solution   Left Ear   Place 3 drops into the left ear every 2 (two) hours as needed for pain.   10 mL   0   . meclizine (ANTIVERT) 25 MG tablet   Oral   Take 1-2 tablets (25-50 mg total) by mouth 3 (three) times daily as needed.   28 tablet   0    BP 122/79  Pulse 81  Temp(Src) 98.1 F (36.7 C) (Oral)  Resp 20  SpO2 97% Physical Exam  Nursing note and vitals reviewed. Constitutional: She is oriented to person, place, and time. She appears well-developed and well-nourished. No distress.  HENT:  Head: Normocephalic. Head is with contusion.    Right Ear: A middle ear effusion (clear) is present.  Left Ear: Tympanic membrane is erythematous and bulging. A middle ear effusion (purulent) is present.  Nose: Nose normal. No mucosal edema or rhinorrhea.  Mouth/Throat: Uvula is midline, oropharynx is clear and moist and  mucous membranes are normal. Mucous membranes are not dry. No edematous. No oropharyngeal exudate, posterior oropharyngeal edema or tonsillar abscesses.  Small palpable contusion to left occiput  Eyes: Conjunctivae and EOM are normal. Pupils are equal, round, and reactive to light.  3-4 beats slowly extinguishing nystagmus with rapid head turn; no vertical nystagmus  Neck: Normal range of motion. Spinous process tenderness and muscular tenderness (left) present. Normal range of motion present.    Full ROM with mild pain Midline and paraspinal tenderness on exam without deformity or step-off  Cardiovascular: Normal rate, regular rhythm, normal heart sounds and intact distal pulses.   No murmur heard. Pulses:      Radial pulses are 2+ on the right side, and 2+ on the left side.       Dorsalis pedis pulses are 2+ on the right side, and 2+ on the left side.       Posterior tibial pulses are 2+ on the right side, and 2+ on the left side.  No tachycardia  Pulmonary/Chest: Effort normal and breath sounds normal. No accessory muscle usage. No respiratory distress. She has no decreased breath sounds. She has no wheezes. She has no rhonchi. She has no rales. She exhibits no tenderness and no bony tenderness.  Abdominal: Soft. Normal appearance and bowel sounds are normal. She exhibits no distension. There is no tenderness. There is no rigidity, no guarding and no CVA tenderness.  Musculoskeletal: Normal range of motion.       Thoracic back: She exhibits normal range of motion.       Lumbar back: She exhibits normal range of motion.  Full range of motion of the T-spine and L-spine No tenderness to  palpation of the spinous processes of the T-spine or L-spine No tenderness to palpation of the paraspinous muscles of the L-spine  Lymphadenopathy:    She has no cervical adenopathy.  Neurological: She is alert and oriented to person, place, and time. No cranial nerve deficit. GCS eye subscore is 4. GCS  verbal subscore is 5. GCS motor subscore is 6.  Reflex Scores:      Tricep reflexes are 2+ on the right side and 2+ on the left side.      Bicep reflexes are 2+ on the right side and 2+ on the left side.      Brachioradialis reflexes are 2+ on the right side and 2+ on the left side.      Patellar reflexes are 2+ on the right side and 2+ on the left side.      Achilles reflexes are 2+ on the right side and 2+ on the left side. Speech is clear and goal oriented, follows commands Normal strength in upper and lower extremities bilaterally including dorsiflexion and plantar flexion, strong and equal grip strength Sensation normal to light and sharp touch  Patient refuses further neurologic exam stating it makes her too dizzy  Skin: Skin is warm and dry. No rash noted. She is not diaphoretic. No erythema.  Psychiatric: She has a normal mood and affect. Her behavior is normal.    ED Course  Procedures (including critical care time) Labs Review Labs Reviewed  URINE RAPID DRUG SCREEN (HOSP PERFORMED) - Abnormal; Notable for the following:    Benzodiazepines POSITIVE (*)    All other components within normal limits   Imaging Review No results found.  MDM   1. BPPV (benign paroxysmal positional vertigo)   2. Nausea   3. Otitis media, left      AYDE RECORD presents with headache, ear pain, dizziness and nausea.  Headache and nausea are likely postconcussive syndrome. Review shows the patient had CT of the head and neck 2 days ago after fall which were negative for ICH, skull fracture or acute abnormality.  Dizziness and ear pain likely secondary to otitis media.  Patient also with symptoms of BPPV.  Truncated neurologic exam due to patient's uncooperativeness. She states it makes her "too dizzy."  Medications ordered for symptomatic treatment. Will repeat neurologic exam at reassessment.  9:36 PM Pt feels better and is able to ambulate without difficulty.    Repeat Neurologic  Exam: Speech is clear and goal oriented, follows commands Major Cranial nerves without deficit, no facial droop Normal strength in upper and lower extremities bilaterally including dorsiflexion and plantar flexion, strong and equal grip strength Sensation normal to light and sharp touch Moves extremities without ataxia, coordination intact Normal finger to nose and rapid alternating movements Neg romberg, no pronator drift Normal gait and balance  Patient is not anticoagulated. I reviewed her chart and her CT scan of her head and neck 2 days ago was negative.   I personally reviewed the imaging tests through PACS system.  I reviewed available ER/hospitalization records through the EMR.  I do not see an indication to repeat the CT scan.  Pt is tolerating PO without difficulty.  OM of the left ear noted on the physical exam. Potentially a cause of her vertigo. We'll treat with amoxicillin as patient reports this is the only antibiotic that she can take even if she is allergic to penicillin, and auralgan for pain.  Will also d/c home with Meclazine for BPPV.  Patient  has a normal neurologic exam after being treated with meclizine.    It has been determined that no acute conditions requiring further emergency intervention are present at this time. The patient/guardian have been advised of the diagnosis and plan. We have discussed signs and symptoms that warrant return to the ED, such as changes or worsening in symptoms.   Vital signs are stable at discharge.   BP 122/79  Pulse 81  Temp(Src) 98.1 F (36.7 C) (Oral)  Resp 20  SpO2 97%  Patient/guardian has voiced understanding and agreed to follow-up with the PCP or specialist.         Dierdre Forth, PA-C 12/26/12 2212

## 2012-12-26 NOTE — ED Notes (Signed)
Patient reports that she was seen on 12/24/12 in the ED for a fall in which she hit her head. Patient states she is still dizzy and having nausea. Patient states she went to the Orthopedic physician this AM and was told to come to the Ed for further evaluation and treatment.

## 2012-12-27 NOTE — ED Provider Notes (Signed)
Medical screening examination/treatment/procedure(s) were performed by non-physician practitioner and as supervising physician I was immediately available for consultation/collaboration.  Sebrena Engh R. Cassidy Tabet, MD 12/27/12 1431 

## 2012-12-28 ENCOUNTER — Other Ambulatory Visit (HOSPITAL_COMMUNITY): Payer: BC Managed Care – PPO | Attending: Psychiatry | Admitting: Psychiatry

## 2012-12-28 ENCOUNTER — Encounter (HOSPITAL_COMMUNITY): Payer: Self-pay

## 2012-12-28 DIAGNOSIS — F988 Other specified behavioral and emotional disorders with onset usually occurring in childhood and adolescence: Secondary | ICD-10-CM

## 2012-12-28 DIAGNOSIS — F332 Major depressive disorder, recurrent severe without psychotic features: Secondary | ICD-10-CM | POA: Insufficient documentation

## 2012-12-28 DIAGNOSIS — F411 Generalized anxiety disorder: Secondary | ICD-10-CM

## 2012-12-28 DIAGNOSIS — F329 Major depressive disorder, single episode, unspecified: Secondary | ICD-10-CM

## 2012-12-28 DIAGNOSIS — F331 Major depressive disorder, recurrent, moderate: Secondary | ICD-10-CM

## 2012-12-28 DIAGNOSIS — F4310 Post-traumatic stress disorder, unspecified: Secondary | ICD-10-CM | POA: Insufficient documentation

## 2012-12-28 DIAGNOSIS — F909 Attention-deficit hyperactivity disorder, unspecified type: Secondary | ICD-10-CM | POA: Insufficient documentation

## 2012-12-28 DIAGNOSIS — F32A Depression, unspecified: Secondary | ICD-10-CM | POA: Insufficient documentation

## 2012-12-28 NOTE — Progress Notes (Signed)
    Daily Group Progress Note  Program: IOP  Group Time: 9:00-10:30 am   Participation Level: Active  Behavioral Response: Appropriate  Type of Therapy:  Process Group  Summary of Progress: Today is Pts first day in the IOP program. Pt is a 37 yr old, married, caucasian, female, who was referred per Charmian Muff, LCAS, treatment for worsening depressive and anxiety symptoms. Denies SI/HI or A/V hallucinations. According to pt, her symptoms worsened ~ five-six months ago. Denies hx of psychiatric admits. Admits to prior suicide attempt (OD) in high school.       Group Time: 10:30 am - 12:00 pm   Participation Level:  Active  Behavioral Response: Appropriate  Type of Therapy: Psycho-education Group  Summary of Progress: Pt explored the "personal bill of rights" and discussed the ones that pertained to them. Pt came up with a homework assignment to practice implementing the right they felt they needed more of in their life.   Carman Ching, LCSW
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
## 2012-12-29 ENCOUNTER — Other Ambulatory Visit (HOSPITAL_COMMUNITY): Payer: BC Managed Care – PPO | Admitting: Psychiatry

## 2012-12-29 DIAGNOSIS — F331 Major depressive disorder, recurrent, moderate: Secondary | ICD-10-CM

## 2012-12-30 ENCOUNTER — Other Ambulatory Visit (HOSPITAL_COMMUNITY): Payer: BC Managed Care – PPO | Admitting: Psychiatry

## 2012-12-30 DIAGNOSIS — F331 Major depressive disorder, recurrent, moderate: Secondary | ICD-10-CM

## 2012-12-30 DIAGNOSIS — F329 Major depressive disorder, single episode, unspecified: Secondary | ICD-10-CM

## 2012-12-30 NOTE — Progress Notes (Unsigned)
Psychiatric Assessment Adult  Patient Identification:  [name] Date of Evaluation:  12/30/2012 Chief Complaint: depression History of Chief Complaint:  No chief complaint on file.   HPI Review of Systems Physical Exam  Depressive Symptoms: {DEPRESSION SYMPTOMS:20000}  (Hypo) Manic Symptoms:   Elevated Mood:  {BHH YES OR NO:22294} Irritable Mood:  {BHH YES OR NO:22294} Grandiosity:  {BHH YES OR NO:22294} Distractibility:  {BHH YES OR NO:22294} Labiality of Mood:  {BHH YES OR NO:22294} Delusions:  {BHH YES OR NO:22294} Hallucinations:  {BHH YES OR NO:22294} Impulsivity:  {BHH YES OR NO:22294} Sexually Inappropriate Behavior:  {BHH YES OR NO:22294} Financial Extravagance:  {BHH YES OR NO:22294} Flight of Ideas:  {BHH YES OR NO:22294}  Anxiety Symptoms: Excessive Worry:  {BHH YES OR NO:22294} Panic Symptoms:  {BHH YES OR NO:22294} Agoraphobia:  {BHH YES OR NO:22294} Obsessive Compulsive: {BHH YES OR NO:22294}  Symptoms: {Obsessive Compulsive Symptoms:22671} Specific Phobias:  {BHH YES OR NO:22294} Social Anxiety:  {BHH YES OR NO:22294}  Psychotic Symptoms:  Hallucinations: {BHH YES OR NO:22294} {Hallucinations:22672} Delusions:  {BHH YES OR NO:22294} Paranoia:  {BHH YES OR NO:22294}   Ideas of Reference:  {BHH YES OR NO:22294}  PTSD Symptoms: Ever had a traumatic exposure:  {BHH YES OR NO:22294} Had a traumatic exposure in the last month:  {BHH YES OR NO:22294} Re-experiencing: {BHH YES OR NO:22294} {Re-experiencing:22673} Hypervigilance:  {BHH YES OR NO:22294} Hyperarousal: {BHH YES OR NO:22294} {Hyperarousal:22674} Avoidance: {BHH YES OR NO:22294} {Avoidance:22675}  Traumatic Brain Injury: {BHH YES OR NO:22294} {Traumatic Brain Injury:22676}  Past Psychiatric History: Diagnosis: ***  Hospitalizations: ***  Outpatient Care: ***  Substance Abuse Care: ***  Self-Mutilation: ***  Suicidal Attempts: ***  Violent Behaviors: ***   Past Medical History:    Past Medical History  Diagnosis Date  . Depression   . Anxiety   . Diabetes mellitus   . GERD (gastroesophageal reflux disease)   . ADD (attention deficit disorder)   . Hyperlipemia   . Herniated disc   . Allergy     Seasonal  . Diabetic neuropathy   . Shortness of breath     with exertion   . Sleep apnea     obsructive settings at 13   . Heart murmur     asa child  . Seizures     hx of / month ago - dehydration   . 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 Degenerative disc disease   . Annular disc tear   . Retrolisthesis     L4 and L5  . Bulging disc    History of Loss of Consciousness:  {BHH YES OR NO:22294} Seizure History:  {BHH YES OR NO:22294} Cardiac History:  {BHH YES OR NO:22294} Allergies:   Allergies  Allergen Reactions  . Ace Inhibitors Other (See Comments)    Spaces out and triggers migraines  . Atarax [Hydroxyzine Hcl] Anaphylaxis  . Cephalosporins Anaphylaxis  . Codeine Anaphylaxis    Tolerates Dilaudid and Percocet.  . Compazine Anaphylaxis    Tolerates Phenergan.  . Decadron [Dexamethasone] Anaphylaxis  . Demerol Anaphylaxis  . Metoclopramide Hcl Anaphylaxis  . Morphine And Related Anaphylaxis    Tolerates Dilaudid and Percocet.  . Other Other (See Comments)    ALL STEROIDS CAUSES ULCERS.   Marland Kitchen Penicillins Anaphylaxis  . Pork-Derived Products Other (See Comments)    Religous reasons--per pt ok to take Heparin when medically necessary  . Prozac [Fluoxetine  Hcl] Other (See Comments)    Pt reports severe negative mood altering after taking.   Marland Kitchen Shellfish-Derived Products Other (See Comments)    Religous reasons  . Sulfa Antibiotics Anaphylaxis  . Triptans Anaphylaxis    Makes migraines worse also  . Zanaflex [Tizanidine Hcl] Other (See Comments)    Loss of balance, lightheadedness, spaces out  . Zofran Anaphylaxis  . Dihydroergotamine Other (See Comments)    unknown   Current Medications:   Current Outpatient Prescriptions  Medication Sig Dispense Refill  . ALPRAZolam (XANAX) 1 MG tablet Take 1 tablet (1 mg total) by mouth at bedtime as needed for anxiety. Do not mix with narcotic medication or alcohol.  30 tablet  0  . amoxicillin (AMOXIL) 250 MG/5ML suspension Take 10 mLs (500 mg total) by mouth 2 (two) times daily.  200 mL  0  . antipyrine-benzocaine (AURALGAN) otic solution Place 3 drops into the left ear every 2 (two) hours as needed for pain.  10 mL  0  . bisacodyl (DULCOLAX) 5 MG EC tablet Take 5 mg by mouth daily as needed for constipation.      Marland Kitchen CALCIUM-VITAMIN D PO Take 3 tablets by mouth daily.      . Cyanocobalamin (VITAMIN B-12) 2500 MCG SUBL Place 2,500 mcg under the tongue daily.      . cyproheptadine (PERIACTIN) 2 MG/5ML syrup Take 8 mg by mouth at bedtime.      Marland Kitchen HYDROmorphone (DILAUDID) 4 MG tablet Take 4 mg by mouth 3 (three) times daily as needed for pain.      . meclizine (ANTIVERT) 25 MG tablet Take 1-2 tablets (25-50 mg total) by mouth 3 (three) times daily as needed.  28 tablet  0  . Multiple Vitamin (MULTIVITAMIN WITH MINERALS) TABS tablet Take 2 tablets by mouth daily.      Marland Kitchen OLANZapine (ZYPREXA) 2.5 MG tablet Take 2.5 mg by mouth 3 (three) times daily as needed (for hemiplegic migraines).      . OnabotulinumtoxinA (BOTOX IJ) Inject 1 each as directed every 3 (three) months. For migraines      . pantoprazole (PROTONIX) 40 MG tablet Take 1 tablet (40 mg total) by mouth daily.  60 tablet  2  . promethazine (PHENERGAN) 6.25 MG/5ML syrup TAKE 20 MLS (25 MG TOTAL) BY MOUTH 4 (FOUR) TIMES A DAY AS NEEDED FOR NAUSEA.      Marland Kitchen senna (SENOKOT) 8.6 MG tablet Take 1 tablet by mouth daily.       No current facility-administered medications for this visit.    Previous Psychotropic Medications:  Medication Dose   ***  ***                     Substance Abuse History in the last 12 months: Substance Age of 1st Use Last Use Amount Specific Type  Nicotine  ***   ***  ***  ***  Alcohol  ***  ***  ***  ***  Cannabis  ***  ***  ***  ***  Opiates  ***  ***  ***  ***  Cocaine  ***  ***  ***  ***  Methamphetamines  ***  ***  ***  ***  LSD  ***  ***  ***  ***  Ecstasy  ***   ***  ***  ***  Benzodiazepines  ***  ***  ***  ***  Caffeine  ***  ***  ***  ***  Inhalants  ***  ***  ***  ***  Others:                          Medical Consequences of Substance Abuse: ***  Legal Consequences of Substance Abuse: ***  Family Consequences of Substance Abuse: ***  Blackouts:  {BHH YES OR NO:22294} DT's:  {BHH YES OR NO:22294} Withdrawal Symptoms:  {BHH YES OR NO:22294} {Withdrawal Symptoms:22677}  Social History: Current Place of Residence: *** Place of Birth: *** Family Members: *** Marital Status:  {Marital Status:22678} Children: ***  Sons: ***  Daughters: *** Relationships: *** Education:  {Education:22679} Educational Problems/Performance: *** Religious Beliefs/Practices: *** History of Abuse: {Desc; abuse:16542} Occupational Experiences; Military History:  {Military History:22680} Legal History: *** Hobbies/Interests: ***  Family History:   Family History  Problem Relation Age of Onset  . Diabetes type II    . Diabetes Mother   . Hypertension Mother   . Depression Brother   . Cancer Maternal Aunt     ovarian   . Depression Maternal Aunt   . Diabetes Maternal Aunt   . Cancer Maternal Grandmother     Lung  . COPD Maternal Grandmother   . Cancer Maternal Grandfather     Prostate  . Diabetes Maternal Grandfather   . Hearing loss Maternal Grandfather   . Hypertension Maternal Grandfather   . Stroke Maternal Grandfather   . Cancer Paternal Grandmother     Colon    Mental Status Examination/Evaluation: Objective:  Appearance: {Appearance:22683}  Eye Contact::  {BHH EYE CONTACT:22684}  Speech:  {Speech:22685}  Volume:  {Volume (PAA):22686}  Mood:  ***  Affect:  {Affect (PAA):22687}  Thought Process:  {Thought Process  (PAA):22688}  Orientation:  {BHH ORIENTATION (PAA):22689}  Thought Content:  {Thought Content:22690}  Suicidal Thoughts:  {ST/HT (PAA):22692}  Homicidal Thoughts:  {ST/HT (PAA):22692}  Judgement:  {Judgement (PAA):22694}  Insight:  {Insight (PAA):22695}  Psychomotor Activity:  {Psychomotor (PAA):22696}  Akathisia:  {BHH YES OR NO:22294}  Handed:  {Handed:22697}  AIMS (if indicated):  ***  Assets:  {Assets (PAA):22698}    Laboratory/X-Ray Psychological Evaluation(s)   ***  ***   Assessment:  {axis diagnosis:3049000}  AXIS I {psych axis 1:31909}  AXIS II {psych axis 2:31910}  AXIS III Past Medical History  Diagnosis Date  . Depression   . Anxiety   . Diabetes mellitus   . GERD (gastroesophageal reflux disease)   . ADD (attention deficit disorder)   . Hyperlipemia   . Herniated disc   . Allergy     Seasonal  . Diabetic neuropathy   . Shortness of breath     with exertion   . Sleep apnea     obsructive settings at 13   . Heart murmur     asa child  . Seizures     hx of / month ago - dehydration   . 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 Degenerative disc disease   . Annular disc tear   . Retrolisthesis     L4 and L5  . Bulging disc      AXIS IV {psych axis iv:31915}  AXIS V {psych axis v score:31919}   Treatment Plan/Recommendations:  Plan of Care: ***  Laboratory:  {Laboratory:22682}  Psychotherapy: ***  Medications: ***  Routine PRN Medications:  {BHH YES OR NO:22294}  Consultations: ***  Safety Concerns:  ***  Other:      [facility] 10/10/201411:54 AM    This  is an IOP admission note on Mrs. [name]. This 37 year old married mother has been in psychiatric care and treated for depression and anxiety comes to IOP with a small increase in depression. She claims she's here because she is not happy with herself. Because of evaluation she has a chronic sleep problem for years. Her appetite  is stable her energy level is stable. She is not psychotic. She's married for 12 years and a good stable marriage and has 3 active healthy children. The patient has been on multiple psychiatric medications but is having difficulty recurring their doses. The patient has significant medical problems including gastric bypass surgery and presently is on disability for migraine headaches. This patient drinks no alcohol and uses no drugs. The patient this year experienced a five-month episode of being persistently depressed with excessive sleep but norml appetite. Most recently of significant stressors the death of a family dog. Patient also significant financial stresses. She recently came out of bankruptcy. Patient is interested in changing all her psychiatric providers. She's been started in therapy in the setting and come to see myself in the coming weeks in the outpatient setting. At this time she tried to get a better understanding of her self in a group setting. Noted is that she used to be on a significant dose of Xanax is now down to 1 mg at night.  Mental status exam This patient is alert and lucid. She is friendly and engageable. She is appropriately dressed. The patient does not appear depressed. She does not appear anxious. She is articulate and seems quite intelligent. The patient demonstrates no evidence of psychosis. She is not agitated. She is not suicidal. She has good eye contact.  Diagnosis 1 Maj. depression versus adjustment disorder with depressed mood state 2 deferred 3 gastric bypass surgery, migraines 4 mild/moderate 5 61  Plan  At this time she'll go ahead and begin in the IOP program. This patient to be reevaluated by another psychiatrist and be considered for an antidepressant. It should be noted that she's been on multiple medicines and mainly had side effects or failure. This includes Prozac, Zoloft, Wellbutrin, Celexa. There some question of being diagnosed in the past with  attention deficit disorder. She claims that Allegra Ritalin the past. She also says she's been on doxepin but has discontinued this. Unfortunately her past psychotherapy which went on for 2 years was not helpful. I suspect reorients at the this patient to getting away from being dependent upon taking an antidepressant might be the best way to go. Being in talking therapy group session and then transitioning to a new psychotherapist I think that's the pathway for success for this patient. At this time she has not had significant anxiety, sleep problems and is by no means psychotic. She is by no means suicidal.  Dr. Joellyn Haff M.D.

## 2012-12-30 NOTE — Progress Notes (Unsigned)
    Daily Group Progress Note  Program: IOP  Group Time: 9:00-10:30 am   Participation Level: Active  Behavioral Response: Appropriate  Type of Therapy:  Process Group  Summary of Progress: Pt presents with elevated mood, but reports "having a difficult day". Pt was talkative and supportive of other peers. Pt organized a Pt phone support list and shared it with members. Pt did not talk about personal stressors, but listened and provided support.      Group Time: 10:30 am - 12:00 pm   Participation Level:  Active  Behavioral Response: Appropriate  Type of Therapy: Psycho-education Group  Summary of Progress: Pt learned the skill of assertiveness and how to avoid using passive and aggressive communication to best have needs met.  Carman Ching, LCSW

## 2012-12-31 ENCOUNTER — Encounter (HOSPITAL_COMMUNITY): Payer: Self-pay | Admitting: Psychiatry

## 2012-12-31 NOTE — Progress Notes (Signed)
    Daily Group Progress Note  Program: IOP  Group Time: 9-10:30 am  Participation Level: Active  Behavioral Response: Appropriate and Sharing  Type of Therapy:  Process Group  Summary of Progress: The patient reported she can feel that there is a 'front' coming in because her migraines make her extremely sensitive to the barometric pressure. She shared about the frustrations of her family and the struggles she has had with her many health problems. She made some good comments and provided good support to her fellow group members.   Group Time: 10:45-12 pm  Participation Level:  Active  Behavioral Response: Sharing  Type of Therapy: Psycho-education Group  Summary of Progress: The patient drew her wheel and explained why she ranked things as she did. She agreed that getting more exercise would be good for her. She has a very busy schedule with 3 children. The patient reported doing HeartMath and liking the easy way one can do it. The patient has good insight and offered encouragement to the other women present in the session.  Carman Ching, LCSW

## 2013-01-02 ENCOUNTER — Telehealth (HOSPITAL_COMMUNITY): Payer: Self-pay | Admitting: Psychiatry

## 2013-01-02 ENCOUNTER — Other Ambulatory Visit (HOSPITAL_COMMUNITY): Payer: BC Managed Care – PPO

## 2013-01-03 ENCOUNTER — Encounter: Payer: Self-pay | Admitting: *Deleted

## 2013-01-03 ENCOUNTER — Other Ambulatory Visit (HOSPITAL_COMMUNITY): Payer: BC Managed Care – PPO

## 2013-01-03 ENCOUNTER — Other Ambulatory Visit (INDEPENDENT_AMBULATORY_CARE_PROVIDER_SITE_OTHER): Payer: BC Managed Care – PPO

## 2013-01-03 ENCOUNTER — Telehealth (HOSPITAL_COMMUNITY): Payer: Self-pay | Admitting: Psychiatry

## 2013-01-03 ENCOUNTER — Other Ambulatory Visit: Payer: Self-pay | Admitting: *Deleted

## 2013-01-03 DIAGNOSIS — E119 Type 2 diabetes mellitus without complications: Secondary | ICD-10-CM | POA: Diagnosis not present

## 2013-01-03 LAB — URINALYSIS
Bilirubin Urine: NEGATIVE
Hgb urine dipstick: NEGATIVE
Ketones, ur: NEGATIVE
Leukocytes, UA: NEGATIVE
Nitrite: NEGATIVE
Urobilinogen, UA: 0.2 (ref 0.0–1.0)
pH: 6 (ref 5.0–8.0)

## 2013-01-03 LAB — COMPREHENSIVE METABOLIC PANEL
ALT: 15 U/L (ref 0–35)
Albumin: 4.3 g/dL (ref 3.5–5.2)
BUN: 6 mg/dL (ref 6–23)
CO2: 28 mEq/L (ref 19–32)
Calcium: 9.1 mg/dL (ref 8.4–10.5)
Chloride: 104 mEq/L (ref 96–112)
GFR: 97.09 mL/min (ref >60.00)
Glucose, Bld: 122 mg/dL — ABNORMAL HIGH (ref 70–99)
Sodium: 140 mEq/L (ref 135–145)
Total Bilirubin: 0.3 mg/dL (ref 0.3–1.2)
Total Protein: 7.4 g/dL (ref 6.0–8.3)

## 2013-01-03 LAB — MICROALBUMIN / CREATININE URINE RATIO: Microalb Creat Ratio: 1.2 mg/g (ref 0.0–30.0)

## 2013-01-03 LAB — HEMOGLOBIN A1C: Hgb A1c MFr Bld: 6.1 % (ref 4.6–6.5)

## 2013-01-03 NOTE — Progress Notes (Signed)
Pt dropped in for repeat Tanita scan. Did not see pt.   TANITA BODY COMP RESULTS   09/04/11  12/15/11  12/29/11 01/26/12 02/25/12 04/07/12 05/04/12 06/06/12 08/29/12 11/28/12 01/03/13  Fat Mass (lbs)  157.0  135.0  133.5 124.0 112.0 98.5 91.5 91.0 87.0 89.0 81.5  Fat Free Mass (lbs)  133.5  134.5  122.5 125.5 123.5 117.5 116.0 114.5 119.0 111.5 111.5  Total Body Water (lbs)  97.5  98.5  89.5 92.0 90.5 86.0 85.0 84.0 87.0 81.5 81.5   Has lost 7.5 lbs FAT MASS (75.5 lbs total loss of FAT MASS). No change in FFM and TBW.

## 2013-01-04 ENCOUNTER — Other Ambulatory Visit (HOSPITAL_COMMUNITY): Payer: BC Managed Care – PPO | Admitting: Psychiatry

## 2013-01-04 DIAGNOSIS — F329 Major depressive disorder, single episode, unspecified: Secondary | ICD-10-CM

## 2013-01-04 MED ORDER — DEXTROAMPHETAMINE SULFATE 5 MG PO TABS: 5.0000 mg | ORAL_TABLET | Freq: Two times a day (BID) | ORAL | Status: DC

## 2013-01-04 MED ORDER — DEXTROAMPHETAMINE SULFATE 5 MG PO TABS: 5.0000 mg | ORAL_TABLET | Freq: Every day | ORAL | Status: DC

## 2013-01-04 NOTE — Progress Notes (Signed)
    Daily Group Progress Note  Program: IOP  Group Time: 9:00-10:30 am   Participation Level: Active  Behavioral Response: Appropriate  Type of Therapy:  Process Group  Summary of Progress: Pt presented with angry affect towards her mother and expressed frustrations with her regarding a conflict they had this morning. Pt is organizing an email list to support members within the gorup.      Group Time: 10:30 am - 12:00 pm   Participation Level:  Active  Behavioral Response: Appropriate  Type of Therapy: Psycho-education Group  Summary of Progress: Pt learned the stress management skill of heartmath to reduce feelings of depression and anxiety.  Carman Ching, LCSW
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
## 2013-01-05 ENCOUNTER — Other Ambulatory Visit (HOSPITAL_COMMUNITY): Payer: BC Managed Care – PPO | Admitting: Psychiatry

## 2013-01-05 DIAGNOSIS — F329 Major depressive disorder, single episode, unspecified: Secondary | ICD-10-CM

## 2013-01-05 NOTE — Progress Notes (Signed)
    Daily Group Progress Note  Program: IOP  Group Time: 9:00-10:30 am   Participation Level: Active  Behavioral Response: Appropriate  Type of Therapy:  Process Group  Summary of Progress: Pt was struggling with ADHD symptoms and required some redirection to stop interrupting other members and allowing others time to talk. Pt responded well to the redirection. Pt reports improvement in mood and denies depression symptoms. Her main source of stress continues to be how to handle the conflicted relationship with her mother.      Group Time: 10:30 am - 12:00 pm   Participation Level:  Active  Behavioral Response: Appropriate  Type of Therapy: Psycho-education Group  Summary of Progress: Pt participated in an education segment on learning about the support groups offered through the [facility] [place].  Carman Ching, LCSW

## 2013-01-06 ENCOUNTER — Telehealth: Payer: Self-pay | Admitting: Endocrinology

## 2013-01-06 ENCOUNTER — Ambulatory Visit (INDEPENDENT_AMBULATORY_CARE_PROVIDER_SITE_OTHER): Payer: BC Managed Care – PPO | Admitting: Endocrinology

## 2013-01-06 ENCOUNTER — Other Ambulatory Visit: Payer: Self-pay | Admitting: *Deleted

## 2013-01-06 ENCOUNTER — Ambulatory Visit: Payer: BC Managed Care – PPO

## 2013-01-06 VITALS — BP 122/78 | HR 102 | Temp 98.3°F | Resp 12 | Ht 67.0 in | Wt 194.3 lb

## 2013-01-06 DIAGNOSIS — E119 Type 2 diabetes mellitus without complications: Secondary | ICD-10-CM

## 2013-01-06 DIAGNOSIS — E785 Hyperlipidemia, unspecified: Secondary | ICD-10-CM | POA: Diagnosis not present

## 2013-01-06 LAB — LIPID PANEL
HDL: 40.4 mg/dL (ref >39.00)
LDL Cholesterol: 116 mg/dL — ABNORMAL HIGH (ref 0–99)
Total CHOL/HDL Ratio: 5
VLDL: 25.8 mg/dL (ref 0.0–40.0)

## 2013-01-06 MED ORDER — MICROLET LANCETS MISC: 1.0000 | Freq: Every day | Status: DC

## 2013-01-06 MED ORDER — GLUCOSE BLOOD VI STRP: ORAL_STRIP | Status: DC

## 2013-01-06 NOTE — Progress Notes (Signed)
Patient ID: [name], female   DOB: 20-Aug-1975, 37 y.o.   MRN: [id number]  [name] is an 37 y.o. female.   Reason for Appointment: Diabetes follow-up   History of Present Illness   Diagnosis: Type 2 DIABETES MELITUS  Previous history: She had significant diabetes prior to her gastric bypass surgery and is requiring insulin with only fair control Her preoperative weight was 288 pounds Since her gastric bypass surgery she has been off all medications and has had fairly good control  Recent history: She does not have functioning glucose monitor at home and has not checked her glucose.  Also not on any medications at present She has however continued to lose weight and has lost 10 pounds in the last few weeks. Has lost 94 pounds totally However her A1c is still high normal and fasting glucose is 122       Monitors blood glucose:  none       Meals:  4-6 small meals per day.          Physical activity: exercise:  Walks and is starting to go 3/7 days to be gym           Prior A1c report not available  Wt Readings from Last 3 Encounters:  01/06/13 194 lb 4.8 oz (88.134 kg)  12/08/12 200 lb 1.6 oz (90.765 kg)  12/01/12 200 lb (90.719 kg)    LABS:  Appointment on 01/03/2013  Component Date Value Range Status  . Hemoglobin A1C 01/03/2013 6.1  4.6 - 6.5 % Final   Glycemic Control Guidelines for People with Diabetes:Non Diabetic:  <6%Goal of Therapy: <7%Additional Action Suggested:  >8%   . Sodium 01/03/2013 140  135 - 145 mEq/L Final  . Potassium 01/03/2013 3.9  3.5 - 5.1 mEq/L Final  . Chloride 01/03/2013 104  96 - 112 mEq/L Final  . CO2 01/03/2013 28  19 - 32 mEq/L Final  . Glucose, Bld 01/03/2013 122* 70 - 99 mg/dL Final  . BUN 40/98/1191 6  6 - 23 mg/dL Final  . Creatinine, Ser 01/03/2013 0.7  0.4 - 1.2 mg/dL Final  . Total Bilirubin 01/03/2013 0.3  0.3 - 1.2 mg/dL Final  . Alkaline Phosphatase 01/03/2013 69  39 - 117 U/L Final  . AST 01/03/2013 23  0 - 37 U/L Final   . ALT 01/03/2013 15  0 - 35 U/L Final  . Total Protein 01/03/2013 7.4  6.0 - 8.3 g/dL Final  . Albumin 47/82/9562 4.3  3.5 - 5.2 g/dL Final  . Calcium 13/10/6576 9.1  8.4 - 10.5 mg/dL Final  . GFR 46/96/2952 97.09  >60.00 mL/min Final  . Color, Urine 01/03/2013 LT. YELLOW  Yellow;Lt. Yellow Final  . APPearance 01/03/2013 CLEAR  Clear Final  . Specific Gravity, Urine 01/03/2013 <=1.005  1.000 - 1.030 Final  . pH 01/03/2013 6.0  5.0 - 8.0 Final  . Total Protein, Urine 01/03/2013 NEGATIVE  Negative Final  . Urine Glucose 01/03/2013 NEGATIVE  Negative Final  . Ketones, ur 01/03/2013 NEGATIVE  Negative Final  . Bilirubin Urine 01/03/2013 NEGATIVE  Negative Final  . Hgb urine dipstick 01/03/2013 NEGATIVE  Negative Final  . Urobilinogen, UA 01/03/2013 0.2  0.0 - 1.0 Final  . Leukocytes, UA 01/03/2013 NEGATIVE  Negative Final  . Nitrite 01/03/2013 NEGATIVE  Negative Final  . Microalb, Ur 01/03/2013 0.4  0.0 - 1.9 mg/dL Final  . Creatinine,U 84/13/2440 32.0   Final  . Microalb Creat Ratio 01/03/2013 1.2  0.0 -  30.0 mg/g Final      Medication List       This list is accurate as of: 01/06/13 10:17 AM.  Always use your most recent med list.               ALPRAZolam 1 MG tablet  Commonly known as:  XANAX  Take 1 tablet (1 mg total) by mouth at bedtime as needed for anxiety. Do not mix with narcotic medication or alcohol.     bisacodyl 5 MG EC tablet  Commonly known as:  DULCOLAX  Take 5 mg by mouth daily as needed for constipation.     BOTOX IJ  Inject 1 each as directed every 3 (three) months. For migraines     CALCIUM-VITAMIN D PO  Take 3 tablets by mouth daily.     chlorzoxazone 500 MG tablet  Commonly known as:  PARAFON     cyproheptadine 2 MG/5ML syrup  Commonly known as:  PERIACTIN  Take 8 mg by mouth at bedtime.     dextroamphetamine 5 MG tablet  Commonly known as:  DEXTROSTAT  Take 1 tablet (5 mg total) by mouth 2 (two) times daily.     meclizine 25 MG tablet   Commonly known as:  ANTIVERT  Take 1-2 tablets (25-50 mg total) by mouth 3 (three) times daily as needed.     multivitamin with minerals Tabs tablet  Take 2 tablets by mouth daily.     OLANZapine 2.5 MG tablet  Commonly known as:  ZYPREXA  Take 2.5 mg by mouth 3 (three) times daily as needed (for hemiplegic migraines).     pantoprazole 40 MG tablet  Commonly known as:  PROTONIX  Take 1 tablet (40 mg total) by mouth daily.     promethazine 6.25 MG/5ML syrup  Commonly known as:  PHENERGAN  TAKE 20 MLS (25 MG TOTAL) BY MOUTH 4 (FOUR) TIMES A DAY AS NEEDED FOR NAUSEA.     senna 8.6 MG tablet  Commonly known as:  SENOKOT  Take 1 tablet by mouth daily.     tiZANidine 4 MG tablet  Commonly known as:  ZANAFLEX     Vitamin B-12 2500 MCG Subl  Place 2,500 mcg under the tongue daily.        Allergies:  Allergies  Allergen Reactions  . Ace Inhibitors Other (See Comments)    Spaces out and triggers migraines  . Atarax [Hydroxyzine Hcl] Anaphylaxis  . Cephalosporins Anaphylaxis  . Codeine Anaphylaxis    Tolerates Dilaudid and Percocet.  . Compazine Anaphylaxis    Tolerates Phenergan.  . Decadron [Dexamethasone] Anaphylaxis  . Demerol Anaphylaxis  . Metoclopramide Hcl Anaphylaxis  . Morphine And Related Anaphylaxis    Tolerates Dilaudid and Percocet.  . Other Other (See Comments)    ALL STEROIDS CAUSES ULCERS.   Marland Kitchen Penicillins Anaphylaxis  . Pork-Derived Products Other (See Comments)    Religous reasons--per pt ok to take Heparin when medically necessary  . Prozac [Fluoxetine Hcl] Other (See Comments)    Pt reports severe negative mood altering after taking.   Marland Kitchen Shellfish-Derived Products Other (See Comments)    Religous reasons  . Sulfa Antibiotics Anaphylaxis  . Triptans Anaphylaxis    Makes migraines worse also  . Zanaflex [Tizanidine Hcl] Other (See Comments)    Loss of balance, lightheadedness, spaces out  . Zofran Anaphylaxis  . Dihydroergotamine Other (See  Comments)    unknown    Past Medical History  Diagnosis Date  . Depression   .  Anxiety   . Diabetes mellitus   . GERD (gastroesophageal reflux disease)   . ADD (attention deficit disorder)   . Hyperlipemia   . Herniated disc   . Allergy     Seasonal  . Diabetic neuropathy   . Shortness of breath     with exertion   . Sleep apnea     obsructive settings at 13   . Heart murmur     asa child  . Seizures     hx of / month ago - dehydration   . 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 Degenerative disc disease   . Annular disc tear   . Retrolisthesis     L4 and L5  . Bulging disc     Past Surgical History  Procedure Laterality Date  . Cholecystectomy    . Cesarean section    . Tonsillectomy    . Laporoscopy  2007  . Breath tek h pylori  09/11/2011    Procedure: BREATH TEK H PYLORI;  Surgeon: Mariella Saa, MD;  Location: [facility];  Service: General;  Laterality: N/A;  . 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 Bowel resection  11/30/2011    Procedure: SMALL BOWEL RESECTION;  Surgeon: Mariella Saa, MD;  Location: [facility];  Service: General;;  . Esophagogastroduodenoscopy  03/14/2012    Procedure: ESOPHAGOGASTRODUODENOSCOPY (EGD);  Surgeon: Kandis Cocking, MD;  Location: [facility];  Service: General;  Laterality: N/A;  . Esophagogastroduodenoscopy  04/25/2012    Procedure: ESOPHAGOGASTRODUODENOSCOPY (EGD);  Surgeon: Kandis Cocking, MD;  Location: [facility];  Service: General;  Laterality: N/A;    Family History  Problem Relation Age of Onset  . Diabetes type II    . Diabetes Mother   . Hypertension Mother   . Depression Brother   . Cancer Maternal Aunt     ovarian   . Depression Maternal Aunt   . Diabetes Maternal Aunt   . Cancer Maternal Grandmother     Lung  . COPD  Maternal Grandmother   . Cancer Maternal Grandfather     Prostate  . Diabetes Maternal Grandfather   . Hearing loss Maternal Grandfather   . Hypertension Maternal Grandfather   . Stroke Maternal Grandfather   . Cancer Paternal Grandmother     Colon    Social History:  reports that she has never smoked. She has never used smokeless tobacco. She reports that she does not drink alcohol or use illicit drugs.  Review of Systems:  Lipids: She has previously been treated for hyperlipidemia including high triglycerides, currently not on any medication      Examination:   BP 122/78  Pulse 102  Temp(Src) 98.3 F (36.8 C)  Resp 12  Ht 5\' 7"  (1.702 m)  Wt 194 lb 4.8 oz (88.134 kg)  BMI 30.42 kg/m2  SpO2 97%  Body mass index is 30.42 kg/(m^2).    ASSESSMENT/ PLAN::   Diabetes type 2   Blood glucose control is fairly good although her A1c is upper normal and fasting glucose is relatively high She will start monitoring her blood sugar at various times May consider using metformin again if her fasting glucose stays primarily high  HYPERCHOLESTEROLEMIA: Although she had significant dyslipidemia previously has improved after  her weight loss. Also  her risk factors appear to be minimal now and with her age may not need treatment at this time  [facility] 01/06/2013, 10:17 AM   Addendum: LDL 116, not  at goal but slowly improving, will continue to monitor every 6 months and consider treatment of LDL stays over 100

## 2013-01-06 NOTE — Telephone Encounter (Signed)
pts insurance will only pay for One Touch meter and supplies, verbally called into pharmacy.

## 2013-01-09 ENCOUNTER — Other Ambulatory Visit (HOSPITAL_COMMUNITY): Payer: BC Managed Care – PPO | Admitting: Psychiatry

## 2013-01-09 DIAGNOSIS — F329 Major depressive disorder, single episode, unspecified: Secondary | ICD-10-CM

## 2013-01-09 NOTE — Progress Notes (Signed)
Patient ID: [name], female   DOB: Mar 01, 1976, 37 y.o.   MRN: [id number] Patient reviewed and interviewed today, has started the Dexedrine and is tolerating it well. States she attended her cousin's funeral and on them he had an episode which there unclear if it was a seizure but patient reports that she was very anxious sitting in the car with her parents were arguing the entire time .patient reports that she was very anxious and it's possible she could have had a panic attack. She is going to see her neurologist. Sleep and appetite are good patient is actively using her CBT techniques to deal with her mom and her mothers criticism. Denies suicidal or homicidal ideation and has no hallucinations or delusions. Patient will be discharged today and will followup with Dr. Alyse Low and Baxter Hire for medications and therapy

## 2013-01-10 ENCOUNTER — Other Ambulatory Visit (HOSPITAL_COMMUNITY): Payer: BC Managed Care – PPO | Admitting: Psychiatry

## 2013-01-10 DIAGNOSIS — F329 Major depressive disorder, single episode, unspecified: Secondary | ICD-10-CM

## 2013-01-10 NOTE — Patient Instructions (Signed)
Patient completed MH-IOP today.  Will follow up with Dr. Donell Beers on 02-10-13 @ 8:30 am and Orvan July, LCSW on 02-07-13 @ 10:30 pm.  Encouraged support groups.

## 2013-01-10 NOTE — Progress Notes (Signed)
    Daily Group Progress Note  Program: IOP  Group Time: 9:00-10:30 am   Participation Level: Active  Behavioral Response: Appropriate  Type of Therapy:  Process Group  Summary of Progress: Pt presented with calm mood and behavior today. Pts manic symptoms were reduced and she was able to be attentive and focused. Pt expressed frustrations with her expectations of her mother and being disappointed by them. Pt is learning how to control her impulsive behavior towards her mother and reduce conflicts.      Group Time: 10:30 am - 12:00 pm   Participation Level:  Active  Behavioral Response: Appropriate  Type of Therapy: Psycho-education Group  Summary of Progress: Pt participated in a group on grief and loss and identified current losses impacting wellness and appropriate grieving strategies.   Carman Ching, LCSW

## 2013-01-10 NOTE — Progress Notes (Signed)
Patient ID: ELLYANA CRIGLER, female   DOB: 1975/12/25, 37 y.o.   MRN: [id number] D: This is a 37 yr old, married, caucasian, female, who was referred per Charmian Muff, LCAS, treatment for worsening depressive and anxiety symptoms. Denies SI/HI or A/V hallucinations. According to pt, her symptoms worsened ~ five-six months ago. Denies hx of psychiatric admits. Admits to prior suicide attempt (OD) in high school. Denies any prior suicidal gestures. Reports that her brother, mother and maternal aunt suffers with depression. Pt has been seeing Dr. [name] and Hurley Cisco, LCSW for 3 1/2 years, but states she won't be returning to them. "Dr. [name] would freely provide a lot of prescriptions." Triggers/Stressors: 1) Conflictual relationship with mother. "I can't please her. I will never be good enough for her." Pt states that her mother is very Marketing executive. Resides in [place], [place]. 2) Multiple medical issues: Has a L4 and L5 pinched nerve in back. Also, has hemiplegic migraines. Had gastric bypass surgery in November 30, 2011. CC: Epic for other medical issues. 3) Financial Strain: One month ago closed on home. 4) Unresolved grief/loss issues: Family dog died recently. Burial/Pet service was this past Sunday. 5) Father-in-law moved in pt and her family in December 2013. He has Bipolar Disorder. Pt completed MH-IOP today.  Reports improved mood and concentration.  Pt states she is spearheading a family reunion for next year.  Reports that the groups were helpful.  Patient would like to continue working on setting boundaries, stress management techniques and learning ways to cope with anxiety.  A:  D/C today.  Will follow up with Dr. Donell Beers on 02-10-13 @ 8:30 am and Geanie Berlin, LCSW on 02-07-13 @ 10:30 am.  Encouraged support groups.  R:  Pt receptive.

## 2013-01-11 ENCOUNTER — Ambulatory Visit: Payer: BC Managed Care – PPO | Admitting: *Deleted
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
## 2013-01-12 ENCOUNTER — Telehealth: Payer: Self-pay | Admitting: Endocrinology

## 2013-01-12 ENCOUNTER — Other Ambulatory Visit (HOSPITAL_COMMUNITY): Payer: BC Managed Care – PPO

## 2013-01-12 NOTE — Telephone Encounter (Signed)
Pt is requesting cholesterol results

## 2013-01-12 NOTE — Progress Notes (Signed)
    Daily Group Progress Note  Program: IOP  Group Time: 9:00 am -12:00 pm  Participation Level: Active  Behavioral Response: Appropriate  Type of Therapy:  Process Group  Summary of Progress: Pt learned about the symptoms of depression and how the symptoms are present in their daily living and spoke about each symptom they are experiencing.        Maximiliano Cromartie E, LCSW 

## 2013-01-12 NOTE — Telephone Encounter (Signed)
Please call pt with cholesterol lab results / [name]

## 2013-01-13 ENCOUNTER — Other Ambulatory Visit (HOSPITAL_COMMUNITY): Payer: BC Managed Care – PPO

## 2013-01-16 ENCOUNTER — Other Ambulatory Visit (HOSPITAL_COMMUNITY): Payer: BC Managed Care – PPO

## 2013-01-16 NOTE — Telephone Encounter (Signed)
LDL 116, triglycerides normal, HDL 40

## 2013-01-17 ENCOUNTER — Other Ambulatory Visit (HOSPITAL_COMMUNITY): Payer: BC Managed Care – PPO

## 2013-01-18 ENCOUNTER — Other Ambulatory Visit (HOSPITAL_COMMUNITY): Payer: BC Managed Care – PPO

## 2013-01-26 ENCOUNTER — Other Ambulatory Visit (INDEPENDENT_AMBULATORY_CARE_PROVIDER_SITE_OTHER): Payer: Self-pay

## 2013-01-26 ENCOUNTER — Other Ambulatory Visit: Payer: Self-pay

## 2013-01-26 DIAGNOSIS — B9681 Helicobacter pylori [H. pylori] as the cause of diseases classified elsewhere: Secondary | ICD-10-CM

## 2013-01-26 MED ORDER — PANTOPRAZOLE SODIUM 40 MG PO TBEC: 40.0000 mg | DELAYED_RELEASE_TABLET | Freq: Every day | ORAL | Status: DC

## 2013-01-26 NOTE — Progress Notes (Signed)
Refill request for Pantoprazole faxed to [facility]. Okay per verbal order by Dr. Johna Sheriff

## 2013-02-07 ENCOUNTER — Ambulatory Visit (HOSPITAL_COMMUNITY): Payer: Self-pay | Admitting: Licensed Clinical Social Worker

## 2013-02-10 ENCOUNTER — Ambulatory Visit (HOSPITAL_COMMUNITY): Payer: BC Managed Care – PPO | Admitting: Psychiatry

## 2013-02-10 MED ORDER — ALPRAZOLAM 1 MG PO TABS: 1.0000 mg | ORAL_TABLET | Freq: Every evening | ORAL | Status: DC | PRN

## 2013-02-10 MED ORDER — DEXTROAMPHETAMINE SULFATE 5 MG PO TABS: 5.0000 mg | ORAL_TABLET | Freq: Two times a day (BID) | ORAL | Status: DC
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
## 2013-02-14 ENCOUNTER — Telehealth (INDEPENDENT_AMBULATORY_CARE_PROVIDER_SITE_OTHER): Payer: Self-pay

## 2013-02-14 NOTE — Telephone Encounter (Signed)
Pt calling in to get Dr Jamse Mead opinion about getting another injection for nerve block with lidocaine. The pt is getting referred to Pain Management Dr Jordan Likes on 12/11 who is wanting to do the 2nd injection. She also got prescribed Vultaren topical ointment to go along with her Dilaudid so she wants to make sure that is ok. Please advise.

## 2013-02-15 NOTE — Telephone Encounter (Signed)
Called patient to make aware that Dr. Johna Sheriff said it's okay to have another nerve block.  Patient verbalized understanding.

## 2013-02-15 NOTE — Telephone Encounter (Signed)
Okay to have another nerve block please let the patient know

## 2013-02-28 ENCOUNTER — Ambulatory Visit: Payer: BC Managed Care – PPO | Admitting: *Deleted

## 2013-02-28 ENCOUNTER — Ambulatory Visit (HOSPITAL_COMMUNITY): Payer: Self-pay | Admitting: Licensed Clinical Social Worker

## 2013-03-15 ENCOUNTER — Ambulatory Visit (INDEPENDENT_AMBULATORY_CARE_PROVIDER_SITE_OTHER): Payer: BC Managed Care – PPO | Admitting: Licensed Clinical Social Worker

## 2013-03-15 ENCOUNTER — Encounter (HOSPITAL_COMMUNITY): Payer: Self-pay | Admitting: Licensed Clinical Social Worker

## 2013-03-15 DIAGNOSIS — F329 Major depressive disorder, single episode, unspecified: Secondary | ICD-10-CM
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
## 2013-03-22 ENCOUNTER — Telehealth (HOSPITAL_COMMUNITY): Payer: Self-pay | Admitting: *Deleted

## 2013-03-24 ENCOUNTER — Telehealth (HOSPITAL_COMMUNITY): Payer: Self-pay

## 2013-03-27 ENCOUNTER — Telehealth (INDEPENDENT_AMBULATORY_CARE_PROVIDER_SITE_OTHER): Payer: Self-pay

## 2013-03-27 ENCOUNTER — Encounter (HOSPITAL_COMMUNITY): Payer: Self-pay | Admitting: Emergency Medicine

## 2013-03-27 ENCOUNTER — Emergency Department (HOSPITAL_COMMUNITY)
Admission: EM | Admit: 2013-03-27 | Discharge: 2013-03-27 | Disposition: A | Discharge status: Home / Self Care | Payer: BC Managed Care – PPO | Attending: Emergency Medicine | Admitting: Emergency Medicine

## 2013-03-27 ENCOUNTER — Emergency Department (HOSPITAL_COMMUNITY): Payer: BC Managed Care – PPO

## 2013-03-27 DIAGNOSIS — M549 Dorsalgia, unspecified: Secondary | ICD-10-CM | POA: Diagnosis not present

## 2013-03-27 DIAGNOSIS — G43909 Migraine, unspecified, not intractable, without status migrainosus: Secondary | ICD-10-CM | POA: Diagnosis not present

## 2013-03-27 DIAGNOSIS — Z880 Allergy status to penicillin: Secondary | ICD-10-CM | POA: Diagnosis not present

## 2013-03-27 DIAGNOSIS — E119 Type 2 diabetes mellitus without complications: Secondary | ICD-10-CM | POA: Insufficient documentation

## 2013-03-27 DIAGNOSIS — Z9884 Bariatric surgery status: Secondary | ICD-10-CM | POA: Diagnosis not present

## 2013-03-27 DIAGNOSIS — R197 Diarrhea, unspecified: Secondary | ICD-10-CM | POA: Diagnosis not present

## 2013-03-27 DIAGNOSIS — F988 Other specified behavioral and emotional disorders with onset usually occurring in childhood and adolescence: Secondary | ICD-10-CM | POA: Diagnosis not present

## 2013-03-27 DIAGNOSIS — Z8719 Personal history of other diseases of the digestive system: Secondary | ICD-10-CM | POA: Insufficient documentation

## 2013-03-27 DIAGNOSIS — R42 Dizziness and giddiness: Secondary | ICD-10-CM | POA: Diagnosis not present

## 2013-03-27 DIAGNOSIS — F3289 Other specified depressive episodes: Secondary | ICD-10-CM | POA: Diagnosis not present

## 2013-03-27 DIAGNOSIS — Z79899 Other long term (current) drug therapy: Secondary | ICD-10-CM | POA: Insufficient documentation

## 2013-03-27 DIAGNOSIS — Z9049 Acquired absence of other specified parts of digestive tract: Secondary | ICD-10-CM | POA: Diagnosis not present

## 2013-03-27 DIAGNOSIS — R1013 Epigastric pain: Secondary | ICD-10-CM | POA: Diagnosis not present

## 2013-03-27 DIAGNOSIS — R1012 Left upper quadrant pain: Secondary | ICD-10-CM | POA: Diagnosis not present

## 2013-03-27 DIAGNOSIS — R011 Cardiac murmur, unspecified: Secondary | ICD-10-CM | POA: Insufficient documentation

## 2013-03-27 DIAGNOSIS — R110 Nausea: Secondary | ICD-10-CM | POA: Diagnosis not present

## 2013-03-27 DIAGNOSIS — Z9089 Acquired absence of other organs: Secondary | ICD-10-CM | POA: Insufficient documentation

## 2013-03-27 DIAGNOSIS — IMO0002 Reserved for concepts with insufficient information to code with codable children: Secondary | ICD-10-CM | POA: Diagnosis not present

## 2013-03-27 DIAGNOSIS — F411 Generalized anxiety disorder: Secondary | ICD-10-CM | POA: Insufficient documentation

## 2013-03-27 DIAGNOSIS — F329 Major depressive disorder, single episode, unspecified: Secondary | ICD-10-CM | POA: Insufficient documentation

## 2013-03-27 DIAGNOSIS — Z9889 Other specified postprocedural states: Secondary | ICD-10-CM | POA: Insufficient documentation

## 2013-03-27 DIAGNOSIS — R109 Unspecified abdominal pain: Secondary | ICD-10-CM

## 2013-03-27 LAB — CBC WITH DIFFERENTIAL/PLATELET
BASOS ABS: 0.1 10*3/uL (ref 0.0–0.1)
Basophils Relative: 1 % (ref 0–1)
EOS PCT: 1 % (ref 0–5)
Eosinophils Absolute: 0.1 10*3/uL (ref 0.0–0.7)
HEMATOCRIT: 44.5 % (ref 36.0–46.0)
Hemoglobin: 14.7 g/dL (ref 12.0–15.0)
LYMPHS ABS: 2.8 10*3/uL (ref 0.7–4.0)
LYMPHS PCT: 30 % (ref 12–46)
MCH: 27.4 pg (ref 26.0–34.0)
MCHC: 33 g/dL (ref 30.0–36.0)
MCV: 82.9 fL (ref 78.0–100.0)
Monocytes Absolute: 0.4 10*3/uL (ref 0.1–1.0)
Monocytes Relative: 4 % (ref 3–12)
NEUTROS ABS: 6 10*3/uL (ref 1.7–7.7)
Neutrophils Relative %: 64 % (ref 43–77)
PLATELETS: 256 10*3/uL (ref 150–400)
RBC: 5.37 MIL/uL — ABNORMAL HIGH (ref 3.87–5.11)
RDW: 12.6 % (ref 11.5–15.5)
WBC: 9.3 10*3/uL (ref 4.0–10.5)

## 2013-03-27 LAB — COMPREHENSIVE METABOLIC PANEL
ALK PHOS: 87 U/L (ref 39–117)
ALT: 13 U/L (ref 0–35)
AST: 18 U/L (ref 0–37)
Albumin: 4.7 g/dL (ref 3.5–5.2)
BILIRUBIN TOTAL: 0.3 mg/dL (ref 0.3–1.2)
BUN: 9 mg/dL (ref 6–23)
CHLORIDE: 100 meq/L (ref 96–112)
CO2: 28 mEq/L (ref 19–32)
Calcium: 10 mg/dL (ref 8.4–10.5)
Creatinine, Ser: 0.59 mg/dL (ref 0.50–1.10)
GFR calc non Af Amer: >90 mL/min (ref >90)
Glucose, Bld: 100 mg/dL — ABNORMAL HIGH (ref 70–99)
Potassium: 4.1 mEq/L (ref 3.7–5.3)
SODIUM: 141 meq/L (ref 137–147)
Total Protein: 8 g/dL (ref 6.0–8.3)

## 2013-03-27 LAB — LIPASE, BLOOD: Lipase: 23 U/L (ref 11–59)

## 2013-03-27 LAB — URINALYSIS, ROUTINE W REFLEX MICROSCOPIC
BILIRUBIN URINE: NEGATIVE
Glucose, UA: NEGATIVE mg/dL
Hgb urine dipstick: NEGATIVE
Ketones, ur: NEGATIVE mg/dL
Leukocytes, UA: NEGATIVE
NITRITE: NEGATIVE
PROTEIN: NEGATIVE mg/dL
SPECIFIC GRAVITY, URINE: 1.008 (ref 1.005–1.030)
UROBILINOGEN UA: 0.2 mg/dL (ref 0.0–1.0)
pH: 6.5 (ref 5.0–8.0)

## 2013-03-27 MED ORDER — PANTOPRAZOLE SODIUM 40 MG PO TBEC
40.0000 mg | DELAYED_RELEASE_TABLET | Freq: Once | ORAL | Status: AC
  Administered 2013-03-27: 40 mg via ORAL
  Filled 2013-03-27: qty 1

## 2013-03-27 MED ORDER — SUCRALFATE 1 GM/10ML PO SUSP: 1.0000 g | Freq: Three times a day (TID) | ORAL | Status: DC

## 2013-03-27 MED ORDER — FENTANYL CITRATE 0.05 MG/ML IJ SOLN: 25.0000 ug | Freq: Once | INTRAMUSCULAR | Status: DC

## 2013-03-27 MED ORDER — HYDROMORPHONE HCL PF 1 MG/ML IJ SOLN
1.0000 mg | Freq: Once | INTRAMUSCULAR | Status: AC
  Administered 2013-03-27: 1 mg via INTRAMUSCULAR
  Filled 2013-03-27: qty 1

## 2013-03-27 MED ORDER — LORAZEPAM 2 MG/ML IJ SOLN: 0.5000 mg | Freq: Once | INTRAMUSCULAR | Status: DC

## 2013-03-27 MED ORDER — SODIUM CHLORIDE 0.9 % IV BOLUS (SEPSIS): 1000.0000 mL | Freq: Once | INTRAVENOUS | Status: DC

## 2013-03-27 MED ORDER — SUCRALFATE 1 G PO TABS
1.0000 g | ORAL_TABLET | Freq: Once | ORAL | Status: AC
  Administered 2013-03-27: 1 g via ORAL
  Filled 2013-03-27: qty 1

## 2013-03-27 MED ORDER — GI COCKTAIL ~~LOC~~
30.0000 mL | Freq: Once | ORAL | Status: AC
  Administered 2013-03-27: 30 mL via ORAL
  Filled 2013-03-27: qty 30

## 2013-03-27 MED ORDER — PANTOPRAZOLE SODIUM 20 MG PO TBEC: 20.0000 mg | DELAYED_RELEASE_TABLET | Freq: Every day | ORAL | Status: DC

## 2013-03-27 MED ORDER — PROMETHAZINE HCL 25 MG/ML IJ SOLN
25.0000 mg | Freq: Once | INTRAMUSCULAR | Status: AC
  Administered 2013-03-27: 25 mg via INTRAMUSCULAR
  Filled 2013-03-27: qty 1

## 2013-03-27 NOTE — ED Provider Notes (Signed)
CSN: [id number]     Arrival date & time 03/27/13  49 History   First MD Initiated Contact with Patient 03/27/13 1507     Chief Complaint  Patient presents with  . Abdominal Pain  . Diarrhea  . Dizziness   (Consider location/radiation/quality/duration/timing/severity/associated sxs/prior Treatment) HPI [name] is a 38 y.o. female who presents to emergency department complaining of abdominal pain. Patient states pain began today. She states she has associated diarrhea and nausea. 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 She states that she has had similar pain a year ago and was diagnosed with gastric ulcers. She states that she felt better after treatment. She was taking any medications for this. She denies any vomiting. She states pain is worsened with palpation of her abdomen. Nothing makes it better. She states she called her surgeon who told her to put a heating pad on. She states that she did which made her pain worse.  Past Medical History  Diagnosis Date  . Depression   . Anxiety   . Diabetes mellitus   . GERD (gastroesophageal reflux disease)   . ADD (attention deficit disorder)   . Hyperlipemia   . Herniated disc   . Allergy     Seasonal  . Diabetic neuropathy   . Shortness of breath     with exertion   . Sleep apnea     obsructive settings at 13   . Heart murmur     asa child  . Seizures     hx of / month ago - dehydration   . 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 Degenerative disc disease   . Annular disc tear   . Retrolisthesis     L4 and L5  . Bulging disc    Past Surgical History  Procedure Laterality Date  . Cholecystectomy    . Cesarean section    . Tonsillectomy    . Laporoscopy  2007  . Breath tek h pylori  09/11/2011    Procedure: BREATH TEK H PYLORI;  Surgeon: Edward Jolly, MD;  Location: [facility];  Service: General;  Laterality: N/A;   . 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 Bowel resection  11/30/2011    Procedure: SMALL BOWEL RESECTION;  Surgeon: Edward Jolly, MD;  Location: [facility];  Service: General;;  . Esophagogastroduodenoscopy  03/14/2012    Procedure: ESOPHAGOGASTRODUODENOSCOPY (EGD);  Surgeon: Shann Medal, MD;  Location: [facility];  Service: General;  Laterality: N/A;  . Esophagogastroduodenoscopy  04/25/2012    Procedure: ESOPHAGOGASTRODUODENOSCOPY (EGD);  Surgeon: Shann Medal, MD;  Location: [facility];  Service: General;  Laterality: N/A;   Family History  Problem Relation Age of Onset  . Diabetes type II    . Diabetes Mother   . Hypertension Mother   . Depression Mother   . OCD Mother   . Depression Brother   . Cancer Maternal Aunt     ovarian   . Depression Maternal Aunt   . Diabetes Maternal Aunt   . Cancer Maternal Grandmother     Lung  . COPD Maternal Grandmother   . Cancer Maternal Grandfather     Prostate  . Diabetes Maternal Grandfather   . Hearing loss Maternal  Grandfather   . Hypertension Maternal Grandfather   . Stroke Maternal Grandfather   . Cancer Paternal Grandmother     Colon   History  Substance Use Topics  . Smoking status: Never Smoker   . Smokeless tobacco: Never Used  . Alcohol Use: No   OB History   Grav Para Term Preterm Abortions TAB SAB Ect Mult Living                 Review of Systems  Constitutional: Negative for fever and chills.  Respiratory: Negative for cough, chest tightness and shortness of breath.   Cardiovascular: Negative for chest pain, palpitations and leg swelling.  Gastrointestinal: Positive for nausea, abdominal pain and diarrhea. Negative for vomiting and blood in stool.  Genitourinary: Negative for dysuria, flank pain, vaginal bleeding, vaginal discharge, vaginal pain and pelvic pain.   Musculoskeletal: Positive for back pain. Negative for arthralgias, myalgias, neck pain and neck stiffness.  Skin: Negative for rash.  Neurological: Negative for dizziness, weakness and headaches.  All other systems reviewed and are negative.    Allergies  Ace inhibitors; Atarax; Cephalosporins; Codeine; Compazine; Decadron; Demerol; Metoclopramide hcl; Morphine and related; Other; Penicillins; Pork-derived products; Prozac; Shellfish-derived products; Sulfa antibiotics; Triptans; Zanaflex; Zofran; Dihydroergotamine; and Periactin  Home Medications   Current Outpatient Rx  Name  Route  Sig  Dispense  Refill  . ALPRAZolam (XANAX) 1 MG tablet   Oral   Take 1 tablet (1 mg total) by mouth at bedtime as needed for anxiety. Do not mix with narcotic medication or alcohol.   30 tablet   4   . CALCIUM-VITAMIN D PO   Oral   Take 3 tablets by mouth daily.         . Cyanocobalamin (VITAMIN B-12) 2500 MCG SUBL   Sublingual   Place 2,500 mcg under the tongue daily.         . cyproheptadine (PERIACTIN) 2 MG/5ML syrup   Oral   Take 8 mg by mouth at bedtime.         Marland Kitchen dextroamphetamine (DEXTROSTAT) 5 MG tablet   Oral   Take 1 tablet (5 mg total) by mouth 2 (two) times daily. 2 BID  Fill after 12/20 /2014   120 tablet   0   . HYDROmorphone (DILAUDID) 4 MG tablet   Oral   Take by mouth every 4 (four) hours as needed for severe pain.         . magnesium hydroxide (MILK OF MAGNESIA) 400 MG/5ML suspension   Oral   Take 5 mLs by mouth daily as needed for mild constipation.         . Multiple Vitamin (MULTIVITAMIN WITH MINERALS) TABS tablet   Oral   Take 2 tablets by mouth daily.         Marland Kitchen OLANZapine (ZYPREXA) 2.5 MG tablet   Oral   Take 2.5 mg by mouth 3 (three) times daily as needed (for hemiplegic migraines).         . promethazine (PHENERGAN) 6.25 MG/5ML syrup      TAKE 20 MLS (25 MG TOTAL) BY MOUTH 4 (FOUR) TIMES A DAY AS NEEDED FOR NAUSEA.         Marland Kitchen tiZANidine  (ZANAFLEX) 4 MG tablet               . OnabotulinumtoxinA (BOTOX IJ)   Injection   Inject 1 each as directed every 3 (three) months. For migraines  BP 106/70  Pulse 83  Temp(Src) 98.3 F (36.8 C) (Oral)  Resp 20  SpO2 100% Physical Exam  Nursing note and vitals reviewed. Constitutional: She appears well-developed and well-nourished. No distress.  HENT:  Head: Normocephalic.  Eyes: Conjunctivae are normal.  Neck: Neck supple.  Cardiovascular: Normal rate, regular rhythm and normal heart sounds.   Pulmonary/Chest: Effort normal and breath sounds normal. No respiratory distress. She has no wheezes. She has no rales.  Abdominal: Soft. Bowel sounds are normal. She exhibits no distension. There is tenderness. There is no rebound and no guarding.  Epigastric and LUQ tenderness  Musculoskeletal: She exhibits no edema.  Neurological: She is alert.  Skin: Skin is warm and dry.  Psychiatric: She has a normal mood and affect. Her behavior is normal.    ED Course  Procedures (including critical care time) Labs Review Labs Reviewed  URINALYSIS, ROUTINE W REFLEX MICROSCOPIC - Abnormal; Notable for the following:    APPearance CLOUDY (*)    All other components within normal limits  CBC WITH DIFFERENTIAL - Abnormal; Notable for the following:    RBC 5.37 (*)    All other components within normal limits  COMPREHENSIVE METABOLIC PANEL - Abnormal; Notable for the following:    Glucose, Bld 100 (*)    All other components within normal limits  LIPASE, BLOOD   Imaging Review Dg Abd Acute W/chest  03/27/2013   CLINICAL DATA:  Acute onset left upper quadrant pain. Nausea, diarrhea and chills.  EXAM: ACUTE ABDOMEN SERIES (ABDOMEN 2 VIEW & CHEST 1 VIEW)  COMPARISON:  PA and lateral chest 10/03/2012. CT abdomen and pelvis 11/03/2012.  FINDINGS: Single view of the chest demonstrates clear lungs and normal heart size. No pneumothorax or pleural fluid.  Two views of the abdomen show no  free intraperitoneal air. The bowel gas pattern is normal. Cholecystectomy clips and IUD are noted.  IMPRESSION: Negative exam.   Electronically Signed   By: Inge Rise M.D.   On: 03/27/2013 16:13    EKG Interpretation   None       MDM   1. Abdominal pain     Patient in emergency department with left upper quadrant pain that she states feels like prior gastric ulcers. She has normal bowel sounds, her labs are unremarkable, her abdomen is soft it is tender in the left upper quadrant epigastric area. Acute abdominal series obtained and are negative. I do not think patient is a complication of her gastric bypass or does not have any signs of obstruction. She does state that her symptoms are just like when she had an ulcer. I treated her in emergency Department Protonix, Carafate, GI cocktail which did not help. She requested IV because she stated "I'm dehydrated." Patient does not appear to be dehydrated on exam, her vital signs are normal, her labs are normal including renal function, her urine is completely normal. I have given her a couple water which she states hurts when she drinks. She requested IV pain medicines for her pain and nausea. I have given her a shot of Dilaudid and Phenergan given she is allergic to every single other pain medicine and antiemetics. She does take Dilaudid 4 mg every 4 hours as needed at home for pain, and she takes Phenergan as well. Even after treating her with her pain medications IV, patient stated that her pain is not any better and she kept requesting IV pain medicine. She stated that only IV medications help her pain. I explained  to her I will not be giving her IV pain medicines and that she does not need to be admitted. I will discharge her home on Protonix, Carafate, Pepcid. She will continue her pain medications at home. She'll followup will followup with her gastroenterologist. No indication for further imaging at this time.  Filed Vitals:   03/27/13  1705  BP: 112/59  Pulse: 89  Temp: 98.9 F (37.2 C)  Resp:        Kahdijah Errickson A Alisi Lupien, PA-C 03/28/13 0000

## 2013-03-27 NOTE — Discharge Instructions (Signed)
Drink plenty of fluids. He did not appear to be dehydrated at this time. Take Protonix and Carafate as prescribed. Continue medications at home. Followup with your gastroenterologist.  Peptic Ulcer A peptic ulcer is a sore in the lining of in your esophagus (esophageal ulcer), stomach (gastric ulcer), or in the first part of your small intestine (duodenal ulcer). The ulcer causes erosion into the deeper tissue. CAUSES  Normally, the lining of the stomach and the small intestine protects itself from the acid that digests food. The protective lining can be damaged by:  An infection caused by a bacterium called Helicobacter pylori (H. pylori).  Regular use of nonsteroidal anti-inflammatory drugs (NSAIDs), such as ibuprofen or aspirin.  Smoking tobacco. Other risk factors include being older than 37, drinking alcohol excessively, and having a family history of ulcer disease.  SYMPTOMS   Burning pain or gnawing in the area between the chest and the belly button.  Heartburn.  Nausea and vomiting.  Bloating. The pain can be worse on an empty stomach and at night. If the ulcer results in bleeding, it can cause:  Black, tarry stools.  Vomiting of bright red blood.  Vomiting of coffee ground looking materials. DIAGNOSIS  A diagnosis is usually made based upon your history and an exam. Other tests and procedures may be performed to find the cause of the ulcer. Finding a cause will help determine the best treatment. Tests and procedures may include:  Blood tests, stool tests, or breath tests to check for the bacterium H. pylori.  An upper gastrointestinal (GI) series of the esophagus, stomach, and small intestine.  An endoscopy to examine the esophagus, stomach, and small intestine.  A biopsy. TREATMENT  Treatment may include:  Eliminating the cause of the ulcer, such as smoking, NSAIDs, or alcohol.  Medicines to reduce the amount of acid in your digestive tract.  Antibiotic  medicines if the ulcer is caused by the H. pylori bacterium.  An upper endoscopy to treat a bleeding ulcer.  Surgery if the bleeding is severe or if the ulcer created a hole somewhere in the digestive system. HOME CARE INSTRUCTIONS   Avoid tobacco, alcohol, and caffeine. Smoking can increase the acid in the stomach, and continued smoking will impair the healing of ulcers.  Avoid foods and drinks that seem to cause discomfort or aggravate your ulcer.  Only take medicines as directed by your caregiver. Do not substitute over-the-counter medicines for prescription medicines without talking to your caregiver.  Keep any follow-up appointments and tests as directed. SEEK MEDICAL CARE IF:   Your do not improve within 7 days of starting treatment.  You have ongoing indigestion or heartburn. SEEK IMMEDIATE MEDICAL CARE IF:   You have sudden, sharp, or persistent abdominal pain.  You have bloody or dark black, tarry stools.  You vomit blood or vomit that looks like coffee grounds.  You become light headed, weak, or feel faint.  You become sweaty or clammy. MAKE SURE YOU:   Understand these instructions.  Will watch your condition.  Will get help right away if you are not doing well or get worse. Document Released: 03/06/2000 Document Revised: 12/02/2011 Document Reviewed: 10/07/2011 [employer] [employer] Patient Information 2014 [place].

## 2013-03-27 NOTE — Telephone Encounter (Signed)
Pt is s/p gastric bypass by Dr. Excell Seltzer in 2013. Pt calling today to report that she has just had a sudden pain (rates as a 9 out of 10) in her pouch.  She has had diarrhea and some chills today, but denies fever.  No strenuous activity other than taking out the trash. Dr. Excell Seltzer advised that pt should go to [facility] ED for evaluation and blood work.  Pt made aware and agreed with POC.

## 2013-03-27 NOTE — ED Notes (Signed)
Pt states that she had gastric bypass in Sept 2013 by [place]. Today started having severe abd pain, diarrhea and dizziness. Called her surgeon and was told to come to ED for eval. Pt states she has had ulcers before and is what the pain feels like.

## 2013-03-28 ENCOUNTER — Telehealth (INDEPENDENT_AMBULATORY_CARE_PROVIDER_SITE_OTHER): Payer: Self-pay

## 2013-03-28 ENCOUNTER — Telehealth (INDEPENDENT_AMBULATORY_CARE_PROVIDER_SITE_OTHER): Payer: Self-pay | Admitting: Surgery

## 2013-03-28 NOTE — Telephone Encounter (Signed)
Pt called in stating she went to ER yesterday for LUQ pain. All tests and labs came back okay per pt. She was given prescription for protonix and carafate. She is not taking carafate because it costs to much. She was discharged last night and told to follow up with her GI doctor. Patient still has LUQThis morning she passed out and has a cut above her eye and bruises on cheeks and legs. She is constantly having chills and cannot get warm. She is lightheaded and nauseous. Phenergan shot isn't working. She wants to know what Dr Excell Seltzer advises for her to do since she was discharged from hospital and still symptomatic.  Advised I would send him a message and we would call her back.

## 2013-03-28 NOTE — Telephone Encounter (Signed)
[name]  1975/09/12 [id number]  Patient Care Team: Kathyrn Lass, MD as PCP - General (Family Medicine) Bethann Goo Himmelrich, RD as Dietitian Rupinder Charm Rings, MD as Consulting Physician (Psychiatry) Amil Amen as Referring Physician (Psychiatry) Cindee Salt, MD as Consulting Physician (Physical Medicine and Rehabilitation)  This patient is a 38 y.o.female who calls today for surgical evaluation.   Reason for call: Persistent upper abdominal pain.  Patient's husband called concerned.  Have not heard back in the office.  The patient Has a history of gastric bypass in 2992 complicated with marginal ulcers.  Intermittent upper abdominal pain for the past year.  CT scan in August 2014 showed no malrotation or other abnormality.  Followup endoscopy by Dr. Paulita Fujita with gastroenterology showed ulcer healed.  She had recurrent upper abdominal pain that felt like the ulcer came back.  Called yesterday.  Went to the emergency room last night.  Workup was negative.  CT scan not done though.  Recommended to start PPI & carafate.  Did not want to start Carafate.  The husband called.  The patient did not want to talk on the phone.  Main issue is abdominal soreness.  Not taking any pain medication.  Can tolerate some liquids.  No severe nausea no emesis.  I recommended Tylenol Ice./Heat around the clock to help things calm down.  Drink liquids only x 2days.  If it becomes unbearable, she may need to go to the emergency room to have a CAT scan to rule out malrotation (normal one in Aug 2014).  Take the antiacid meds as prescribed.  We will try and get Dr. Excell Seltzer for his opinion soon.  I strongly recommend that call back in the morning during the daytime for feedback.  Patient Active Problem List   Diagnosis Date Noted  . Depression 12/28/2012  . History of prescription drug abuse 12/09/2012  . Acute encephalopathy 10/03/2012  . Other and unspecified postsurgical nonabsorption  05/19/2012  . Follow-up examination, following other surgery 05/19/2012  . Marginal ulcer 04/15/2012  . 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 LUQ abdominal pain 03/12/2012  . Diabetes mellitus 01/19/2012  . OSA on CPAP 01/19/2012  . History of migraine 01/19/2012  . Morbid obesity 08/20/2011  . Chest pain 07/28/2011  . Hemiplegia affecting left nondominant side 07/28/2011  . 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 Conversion disorder 03/10/2011  . Abdominal pain 03/10/2011  . Diarrhea 03/10/2011    Past Medical History  Diagnosis Date  . Depression   . Anxiety   . Diabetes mellitus   . GERD (gastroesophageal reflux disease)   . ADD (attention deficit disorder)   . Hyperlipemia   . Herniated disc   . Allergy     Seasonal  . Diabetic neuropathy   . Shortness of breath     with exertion   . Sleep apnea     obsructive settings at 13   . Heart murmur     asa child  . Seizures     hx of / month ago - dehydration   . 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 Degenerative disc disease   . Annular disc tear   . Retrolisthesis     L4 and L5  . Bulging disc     Past Surgical History  Procedure Laterality Date  . Cholecystectomy    . Cesarean section    . Tonsillectomy    .  Laporoscopy  2007  . Breath tek h pylori  09/11/2011    Procedure: BREATH TEK H PYLORI;  Surgeon: Edward Jolly, MD;  Location: [facility];  Service: General;  Laterality: N/A;  . 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 Bowel resection  11/30/2011    Procedure: SMALL BOWEL RESECTION;  Surgeon: Edward Jolly, MD;  Location: [facility];  Service: General;;  . Esophagogastroduodenoscopy  03/14/2012    Procedure: ESOPHAGOGASTRODUODENOSCOPY (EGD);  Surgeon: Shann Medal, MD;  Location: [facility];  Service:  General;  Laterality: N/A;  . Esophagogastroduodenoscopy  04/25/2012    Procedure: ESOPHAGOGASTRODUODENOSCOPY (EGD);  Surgeon: Shann Medal, MD;  Location: [facility];  Service: General;  Laterality: N/A;    History   Social History  . Marital Status: Married    Spouse Name: N/A    Number of Children: N/A  . Years of Education: N/A   Occupational History  . Not on file.   Social History Main Topics  . Smoking status: Never Smoker   . Smokeless tobacco: Never Used  . Alcohol Use: No  . Drug Use: No  . Sexual Activity: Yes   Other Topics Concern  . Not on file   Social History Narrative  . No narrative on file    Family History  Problem Relation Age of Onset  . Diabetes type II    . Diabetes Mother   . Hypertension Mother   . Depression Mother   . OCD Mother   . Depression Brother   . Cancer Maternal Aunt     ovarian   . Depression Maternal Aunt   . Diabetes Maternal Aunt   . Cancer Maternal Grandmother     Lung  . COPD Maternal Grandmother   . Cancer Maternal Grandfather     Prostate  . Diabetes Maternal Grandfather   . Hearing loss Maternal Grandfather   . Hypertension Maternal Grandfather   . Stroke Maternal Grandfather   . Cancer Paternal Grandmother     Colon    Current Outpatient Prescriptions  Medication Sig Dispense Refill  . ALPRAZolam (XANAX) 1 MG tablet Take 1 tablet (1 mg total) by mouth at bedtime as needed for anxiety. Do not mix with narcotic medication or alcohol.  30 tablet  4  . CALCIUM-VITAMIN D PO Take 3 tablets by mouth daily.      . Cyanocobalamin (VITAMIN B-12) 2500 MCG SUBL Place 2,500 mcg under the tongue daily.      . cyproheptadine (PERIACTIN) 2 MG/5ML syrup Take 8 mg by mouth at bedtime.      Marland Kitchen dextroamphetamine (DEXTROSTAT) 5 MG tablet Take 1 tablet (5 mg total) by mouth 2 (two) times daily. 2 BID  Fill after 12/20 /2014  120 tablet  0  . HYDROmorphone (DILAUDID) 4 MG tablet Take by mouth every 4 (four) hours as needed for  severe pain.      . magnesium hydroxide (MILK OF MAGNESIA) 400 MG/5ML suspension Take 5 mLs by mouth daily as needed for mild constipation.      . Multiple Vitamin (MULTIVITAMIN WITH MINERALS) TABS tablet Take 2 tablets by mouth daily.      Marland Kitchen OLANZapine (ZYPREXA) 2.5 MG tablet Take 2.5 mg by mouth 3 (three) times daily as needed (for hemiplegic migraines).      . OnabotulinumtoxinA (BOTOX IJ) Inject 1 each as directed every  3 (three) months. For migraines      . pantoprazole (PROTONIX) 20 MG tablet Take 1 tablet (20 mg total) by mouth daily.  20 tablet  0  . promethazine (PHENERGAN) 6.25 MG/5ML syrup TAKE 20 MLS (25 MG TOTAL) BY MOUTH 4 (FOUR) TIMES A DAY AS NEEDED FOR NAUSEA.      Marland Kitchen sucralfate (CARAFATE) 1 GM/10ML suspension Take 10 mLs (1 g total) by mouth 4 (four) times daily -  with meals and at bedtime.  420 mL  0  . tiZANidine (ZANAFLEX) 4 MG tablet        No current facility-administered medications for this visit.     Allergies  Allergen Reactions  . Ace Inhibitors Other (See Comments)    Spaces out and triggers migraines  . Atarax [Hydroxyzine Hcl] Anaphylaxis  . Cephalosporins Anaphylaxis  . Codeine Anaphylaxis    Tolerates Dilaudid and Percocet.  . Compazine Anaphylaxis    Tolerates Phenergan.  . Decadron [Dexamethasone] Anaphylaxis  . Demerol Anaphylaxis  . Metoclopramide Hcl Anaphylaxis  . Morphine And Related Anaphylaxis    Tolerates Dilaudid and Percocet.  . Other Other (See Comments)    ALL STEROIDS CAUSES ULCERS.   Marland Kitchen Penicillins Anaphylaxis  . Pork-Derived Products Other (See Comments)    Religous reasons--per pt ok to take Heparin when medically necessary  . Prozac [Fluoxetine Hcl] Other (See Comments)    Pt reports severe negative mood altering after taking.   Marland Kitchen Shellfish-Derived Products Other (See Comments)    Religous reasons  . Sulfa Antibiotics Anaphylaxis  . Triptans Anaphylaxis    Makes migraines worse also  . Zanaflex [Tizanidine Hcl] Other (See  Comments)    Loss of balance, lightheadedness, spaces out  . Zofran Anaphylaxis  . Dihydroergotamine Other (See Comments)    unknown  . Periactin [Cyproheptadine] Rash    '@VS'$ @  Dg Abd Acute W/chest  03/27/2013   CLINICAL DATA:  Acute onset left upper quadrant pain. Nausea, diarrhea and chills.  EXAM: ACUTE ABDOMEN SERIES (ABDOMEN 2 VIEW & CHEST 1 VIEW)  COMPARISON:  PA and lateral chest 10/03/2012. CT abdomen and pelvis 11/03/2012.  FINDINGS: Single view of the chest demonstrates clear lungs and normal heart size. No pneumothorax or pleural fluid.  Two views of the abdomen show no free intraperitoneal air. The bowel gas pattern is normal. Cholecystectomy clips and IUD are noted.  IMPRESSION: Negative exam.   Electronically Signed   By: Inge Rise M.D.   On: 03/27/2013 16:13   Clinical Data: Left-sided abdominal pain. History of prior gastric  bypass surgery. Nausea and diarrhea.  CT ABDOMEN AND PELVIS WITH CONTRAST  Technique: Multidetector CT imaging of the abdomen and pelvis was  performed following the standard protocol during bolus  administration of intravenous contrast.  Contrast: 43mL OMNIPAQUE IOHEXOL 300 MG/ML SOLN, 115mL OMNIPAQUE  IOHEXOL 300 MG/ML SOLN  Comparison: CT of the abdomen and pelvis 09/18/2012.  Findings:  Lung Bases: Dependent atelectasis in the lower lobes of the lungs  bilaterally.  Abdomen/Pelvis: Status post cholecystectomy. The appearance of  the liver, pancreas, spleen, bilateral adrenal glands and bilateral  kidneys is unremarkable. Postoperative changes of gastric bypass  procedure are noted. No significant volume of ascites. No  pneumoperitoneum. No pathologic distension of small bowel. Large  volume of stool throughout the colon may suggest constipation. IUD  in place within the endometrial canal. Uterus and ovaries are  otherwise unremarkable in appearance. No definite pathologic  lymphadenopathy identified within the abdomen or pelvis. Urinary  bladder is normal in appearance.  Musculoskeletal: There are no aggressive appearing lytic or blastic  lesions noted in the visualized portions of the skeleton.  IMPRESSION:  1. No acute findings in the abdomen or pelvis to account for the  patient's symptoms.  2. However, there is a relatively large volume of stool throughout  the colon which may suggest constipation.  3. Status post cholecystectomy.  4. IUD in position.  Original Report Authenticated By: Vinnie Langton, M.D.   Note: This dictation was prepared with Dragon/digital dictation along with [employer]. Any transcriptional errors that result from this process are unintentional.

## 2013-03-28 NOTE — ED Provider Notes (Signed)
Medical screening examination/treatment/procedure(s) were performed by non-physician practitioner and as supervising physician I was immediately available for consultation/collaboration.  EKG Interpretation   None         Blanchard Kelch, MD 03/28/13 1125

## 2013-03-29 ENCOUNTER — Encounter (INDEPENDENT_AMBULATORY_CARE_PROVIDER_SITE_OTHER): Payer: Self-pay | Admitting: General Surgery

## 2013-03-29 ENCOUNTER — Ambulatory Visit (INDEPENDENT_AMBULATORY_CARE_PROVIDER_SITE_OTHER): Payer: BC Managed Care – PPO | Admitting: General Surgery

## 2013-03-29 ENCOUNTER — Other Ambulatory Visit (INDEPENDENT_AMBULATORY_CARE_PROVIDER_SITE_OTHER): Payer: Self-pay | Admitting: Surgery

## 2013-03-29 ENCOUNTER — Encounter (HOSPITAL_COMMUNITY): Payer: Self-pay | Admitting: General Surgery

## 2013-03-29 ENCOUNTER — Telehealth (INDEPENDENT_AMBULATORY_CARE_PROVIDER_SITE_OTHER): Payer: Self-pay | Admitting: General Surgery

## 2013-03-29 ENCOUNTER — Inpatient Hospital Stay (HOSPITAL_COMMUNITY)
Admission: AD | Admit: 2013-03-29 | Discharge: 2013-03-31 | DRG: 392 | Disposition: A | Discharge status: Home / Self Care | Payer: BC Managed Care – PPO | Source: Ambulatory Visit | Attending: General Surgery | Admitting: General Surgery

## 2013-03-29 ENCOUNTER — Encounter (HOSPITAL_COMMUNITY): Payer: Self-pay | Admitting: Psychiatry

## 2013-03-29 VITALS — BP 98/60 | HR 80 | Temp 98.4°F | Resp 14 | Ht 67.0 in | Wt 184.8 lb

## 2013-03-29 DIAGNOSIS — R1012 Left upper quadrant pain: Secondary | ICD-10-CM

## 2013-03-29 DIAGNOSIS — R112 Nausea with vomiting, unspecified: Principal | ICD-10-CM | POA: Diagnosis present

## 2013-03-29 DIAGNOSIS — E860 Dehydration: Secondary | ICD-10-CM | POA: Diagnosis present

## 2013-03-29 DIAGNOSIS — Z9884 Bariatric surgery status: Secondary | ICD-10-CM

## 2013-03-29 DIAGNOSIS — R197 Diarrhea, unspecified: Secondary | ICD-10-CM | POA: Diagnosis present

## 2013-03-29 LAB — GLUCOSE, CAPILLARY: Glucose-Capillary: 76 mg/dL (ref 70–99)

## 2013-03-29 MED ORDER — PANTOPRAZOLE SODIUM 40 MG IV SOLR
40.0000 mg | Freq: Two times a day (BID) | INTRAVENOUS | Status: DC
Start: 2013-03-29 — End: 2013-03-31
  Administered 2013-03-29 – 2013-03-31 (×4): 40 mg via INTRAVENOUS
  Filled 2013-03-29 (×5): qty 40

## 2013-03-29 MED ORDER — ALPRAZOLAM ER 1 MG PO TB24: 1.0000 mg | ORAL_TABLET | Freq: Every day | ORAL | Status: DC

## 2013-03-29 MED ORDER — PROMETHAZINE HCL 25 MG/ML IJ SOLN
12.5000 mg | Freq: Four times a day (QID) | INTRAMUSCULAR | Status: DC | PRN
  Administered 2013-03-29 – 2013-03-30 (×2): 12.5 mg via INTRAVENOUS
  Administered 2013-03-30: 15:00:00 via INTRAVENOUS
  Administered 2013-03-30 – 2013-03-31 (×4): 12.5 mg via INTRAVENOUS
  Filled 2013-03-29 (×7): qty 1

## 2013-03-29 MED ORDER — MORPHINE SULFATE 2 MG/ML IJ SOLN: 2.0000 mg | INTRAMUSCULAR | Status: DC | PRN

## 2013-03-29 MED ORDER — ALPRAZOLAM 1 MG PO TABS
1.0000 mg | ORAL_TABLET | Freq: Every evening | ORAL | Status: DC | PRN
  Administered 2013-03-29 – 2013-03-30 (×2): 1 mg via ORAL
  Filled 2013-03-29 (×2): qty 1

## 2013-03-29 MED ORDER — KCL IN DEXTROSE-NACL 20-5-0.9 MEQ/L-%-% IV SOLN
INTRAVENOUS | Status: DC
  Administered 2013-03-29 – 2013-03-31 (×4): via INTRAVENOUS
  Filled 2013-03-29 (×7): qty 1000

## 2013-03-29 MED ORDER — HYDROMORPHONE HCL PF 1 MG/ML IJ SOLN
0.5000 mg | INTRAMUSCULAR | Status: DC | PRN
  Administered 2013-03-29 – 2013-03-30 (×3): 0.5 mg via INTRAVENOUS
  Filled 2013-03-29 (×3): qty 1

## 2013-03-29 MED ORDER — LIP MEDEX EX OINT
TOPICAL_OINTMENT | CUTANEOUS | Status: DC | PRN
  Filled 2013-03-29: qty 7

## 2013-03-29 MED ORDER — SODIUM CHLORIDE 0.9 % IV SOLN: INTRAVENOUS | Status: DC

## 2013-03-29 NOTE — Telephone Encounter (Signed)
Error

## 2013-03-29 NOTE — Progress Notes (Signed)
Subjective:   admission history and physical  Chief complaint: Nausea vomiting, diarrhea, abdominal pain, weakness   Patient ID: [name], female   DOB: [birth year], 38 y.o.   MRN: [id number]  HPI Patient is a 38 year old female approximately one and a half years following laparoscopic Roux-en-Y gastric bypass. She has had some intermittent issues with nausea and vomiting and dehydration. She previously was diagnosed with a small marginal ulcer. About 6 months ago she underwent repeat endoscopy when she was having an episode of abdominal pain and nausea and vomiting which showed the ulcer completely healed. These symptoms resolved. She actually has been doing fairly well in recent months without GI symptoms. However she states that 2 days ago she developed fairly sudden onset of left upper quadrant abdominal pain. This was associated with nausea and vomiting. Since then she has had persistent pain which she described as aching and occasional sharp pain well localized in the left upper quadrant. She states she has been unable to keep down any liquids with frequent vomiting of clear or bilious type fluid and frequent diarrhea about every hour. She was seen in the emergency department the day before yesterday with normal lab work and x-rays and was discharged with symptomatic treatment. She however states that her symptoms have continued unabated and she is feeling weaker and lightheaded when she tries to sit up and presents to the office today for evaluation. No melena or hematochezia. She has felt chilled but no definite fever. She has not had any contact with any one with similar symptoms. She is not suspicious of any particular food she has eaten.  Past Medical History  Diagnosis Date  . Depression   . Anxiety   . Diabetes mellitus   . GERD (gastroesophageal reflux disease)   . ADD (attention deficit disorder)   . Hyperlipemia   . Herniated disc   . Allergy     Seasonal  . Diabetic  neuropathy   . Shortness of breath     with exertion   . Sleep apnea     obsructive settings at 13   . Heart murmur     asa child  . Seizures     hx of / month ago - dehydration   . 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 Degenerative disc disease   . Annular disc tear   . Retrolisthesis     L4 and L5  . Bulging disc   \ Past Surgical History  Procedure Laterality Date  . Cholecystectomy    . Cesarean section    . Tonsillectomy    . Laporoscopy  2007  . Breath tek h pylori  09/11/2011    Procedure: BREATH TEK H PYLORI;  Surgeon: Edward Jolly, MD;  Location: [facility];  Service: General;  Laterality: N/A;  . 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 Bowel resection  11/30/2011    Procedure: SMALL BOWEL RESECTION;  Surgeon: Edward Jolly, MD;  Location: [facility];  Service: General;;  . Esophagogastroduodenoscopy  03/14/2012    Procedure: ESOPHAGOGASTRODUODENOSCOPY (EGD);  Surgeon: Shann Medal, MD;  Location: [facility];  Service: General;  Laterality: N/A;  . Esophagogastroduodenoscopy  04/25/2012    Procedure: ESOPHAGOGASTRODUODENOSCOPY (EGD);  Surgeon: Shann Medal, MD;  Location: [facility];  Service:  General;  Laterality: N/A;   Current Outpatient Prescriptions  Medication Sig Dispense Refill  . ALPRAZolam (XANAX) 1 MG tablet Take 1 tablet (1 mg total) by mouth at bedtime as needed for anxiety. Do not mix with narcotic medication or alcohol.  30 tablet  4  . CALCIUM-VITAMIN D PO Take 3 tablets by mouth daily.      . Cyanocobalamin (VITAMIN B-12) 2500 MCG SUBL Place 2,500 mcg under the tongue daily.      Marland Kitchen dextroamphetamine (DEXTROSTAT) 5 MG tablet Take 1 tablet (5 mg total) by mouth 2 (two) times daily. 2 BID  Fill after 12/20 /2014  120 tablet  0  . famotidine (PEPCID) 20 MG tablet  Take 20 mg by mouth 2 (two) times daily.      Marland Kitchen HYDROmorphone (DILAUDID) 4 MG tablet Take by mouth every 4 (four) hours as needed for severe pain.      . Multiple Vitamin (MULTIVITAMIN WITH MINERALS) TABS tablet Take 2 tablets by mouth daily.      Marland Kitchen OLANZapine (ZYPREXA) 2.5 MG tablet Take 2.5 mg by mouth 3 (three) times daily as needed (for hemiplegic migraines).      . OnabotulinumtoxinA (BOTOX IJ) Inject 1 each as directed every 3 (three) months. For migraines      . promethazine (PHENERGAN) 6.25 MG/5ML syrup TAKE 20 MLS (25 MG TOTAL) BY MOUTH 4 (FOUR) TIMES A DAY AS NEEDED FOR NAUSEA.      . pantoprazole (PROTONIX) 20 MG tablet Take 1 tablet (20 mg total) by mouth daily.  20 tablet  0  . sucralfate (CARAFATE) 1 GM/10ML suspension Take 10 mLs (1 g total) by mouth 4 (four) times daily -  with meals and at bedtime.  420 mL  0   No current facility-administered medications for this visit.   Allergies  Allergen Reactions  . Ace Inhibitors Other (See Comments)    Spaces out and triggers migraines  . Atarax [Hydroxyzine Hcl] Anaphylaxis  . Cephalosporins Anaphylaxis  . Codeine Anaphylaxis    Tolerates Dilaudid and Percocet.  . Compazine Anaphylaxis    Tolerates Phenergan.  . Decadron [Dexamethasone] Anaphylaxis  . Demerol Anaphylaxis  . Metoclopramide Hcl Anaphylaxis  . Morphine And Related Anaphylaxis    Tolerates Dilaudid and Percocet.  . Other Other (See Comments)    ALL STEROIDS CAUSES ULCERS.   Marland Kitchen Penicillins Anaphylaxis  . Pork-Derived Products Other (See Comments)    Religous reasons--per pt ok to take Heparin when medically necessary  . Prozac [Fluoxetine Hcl] Other (See Comments)    Pt reports severe negative mood altering after taking.   Marland Kitchen Shellfish-Derived Products Other (See Comments)    Religous reasons  . Sulfa Antibiotics Anaphylaxis  . Triptans Anaphylaxis    Makes migraines worse also  . Zanaflex [Tizanidine Hcl] Other (See Comments)    Loss of balance,  lightheadedness, spaces out  . Zofran Anaphylaxis  . Dihydroergotamine Other (See Comments)    unknown  . Periactin [Cyproheptadine] Rash     Review of Systems  HENT: Negative.   Respiratory: Negative.   Cardiovascular: Negative.   Gastrointestinal: Positive for nausea, vomiting, abdominal pain and diarrhea. Negative for abdominal distention.  Genitourinary: Negative.   Psychiatric/Behavioral: Positive for dysphoric mood. The patient is nervous/anxious.        Objective:   Physical Exam BP 98/60  Pulse 80  Temp(Src) 98.4 F (36.9 C) (Temporal)  Resp 14  Ht $R'5\' 7"'qH$  (1.702 m)  Wt 184 lb 12.8 oz (83.825 kg)  BMI 28.94 kg/m2 General: Well-developed Caucasian female who appears uncomfortable and in mild distress. Skin: No rash or infection HEENT: No palpable masses. Mucous membranes somewhat dry. Sclera nonicteric. Lungs: Clear equal breath sounds bilaterally without increased work of breathing Cardiovascular: Regular rate and rhythm. No murmurs. No edema. Abdomen: Well-healed laparoscopic incisions. No hernias. No distention. Bowel sounds are somewhat hyperactive. There is localized left upper quadrant tenderness with minimal guarding and no paroxysmal signs. No palpable masses. Extremities: No edema or joint swelling Neurologic: She is minimally lethargic but very responsive and oriented x4. No motor or sensory deficits.    Assessment:     2 day history of left upper quadrant pain associated with nausea and vomiting and diarrhea. History of gastric bypass with similar symptoms in the past and history of marginal ulcer which appeared healed in September 2014. Symptoms are somewhat rapid onset 4 ulcer and this would not explain her diarrhea. Possible gastroenteritis. The patient feels she is unable to get by it home feeling weaker and lightheaded but unable to maintain any fluid intake at all.     Plan:     Admit for IV hydration and further evaluation. Check stool studies. We had  already scheduled repeat endoscopy and we will proceed with this during her hospitalization.

## 2013-03-29 NOTE — Telephone Encounter (Signed)
Called pt to check on her, plan to see in office this afternoon

## 2013-03-30 ENCOUNTER — Ambulatory Visit (HOSPITAL_COMMUNITY): Payer: BC Managed Care – PPO

## 2013-03-30 DIAGNOSIS — R1012 Left upper quadrant pain: Secondary | ICD-10-CM | POA: Diagnosis not present

## 2013-03-30 LAB — CBC
HCT: 38.8 % (ref 36.0–46.0)
HEMOGLOBIN: 12.4 g/dL (ref 12.0–15.0)
MCH: 26.7 pg (ref 26.0–34.0)
MCHC: 32 g/dL (ref 30.0–36.0)
MCV: 83.4 fL (ref 78.0–100.0)
Platelets: 212 10*3/uL (ref 150–400)
RBC: 4.65 MIL/uL (ref 3.87–5.11)
RDW: 12.6 % (ref 11.5–15.5)
WBC: 8 10*3/uL (ref 4.0–10.5)

## 2013-03-30 LAB — BASIC METABOLIC PANEL
BUN: 7 mg/dL (ref 6–23)
CO2: 25 mEq/L (ref 19–32)
Calcium: 8.3 mg/dL — ABNORMAL LOW (ref 8.4–10.5)
Chloride: 108 mEq/L (ref 96–112)
Creatinine, Ser: 0.68 mg/dL (ref 0.50–1.10)
GFR calc non Af Amer: >90 mL/min (ref >90)
GLUCOSE: 98 mg/dL (ref 70–99)
POTASSIUM: 3.9 meq/L (ref 3.7–5.3)
Sodium: 143 mEq/L (ref 137–147)

## 2013-03-30 LAB — URINALYSIS, ROUTINE W REFLEX MICROSCOPIC
Bilirubin Urine: NEGATIVE
GLUCOSE, UA: NEGATIVE mg/dL
HGB URINE DIPSTICK: NEGATIVE
Ketones, ur: NEGATIVE mg/dL
Leukocytes, UA: NEGATIVE
Nitrite: NEGATIVE
PH: 5.5 (ref 5.0–8.0)
PROTEIN: NEGATIVE mg/dL
Specific Gravity, Urine: 1.016 (ref 1.005–1.030)
Urobilinogen, UA: 0.2 mg/dL (ref 0.0–1.0)

## 2013-03-30 MED ORDER — IOHEXOL 300 MG/ML  SOLN
25.0000 mL | INTRAMUSCULAR | Status: AC
  Administered 2013-03-30 (×2): 25 mL via ORAL

## 2013-03-30 MED ORDER — MENTHOL 3 MG MT LOZG
1.0000 | LOZENGE | OROMUCOSAL | Status: DC | PRN
  Filled 2013-03-30: qty 9

## 2013-03-30 MED ORDER — OLANZAPINE 2.5 MG PO TABS
2.5000 mg | ORAL_TABLET | Freq: Three times a day (TID) | ORAL | Status: DC | PRN
  Administered 2013-03-30 – 2013-03-31 (×2): 2.5 mg via ORAL
  Filled 2013-03-30 (×2): qty 1

## 2013-03-30 MED ORDER — HYDROMORPHONE HCL PF 1 MG/ML IJ SOLN
0.5000 mg | INTRAMUSCULAR | Status: DC | PRN
  Administered 2013-03-30 – 2013-03-31 (×11): 0.5 mg via INTRAVENOUS
  Filled 2013-03-30 (×11): qty 1

## 2013-03-30 MED ORDER — IOHEXOL 300 MG/ML  SOLN
100.0000 mL | Freq: Once | INTRAMUSCULAR | Status: AC | PRN
  Administered 2013-03-30: 100 mL via INTRAVENOUS

## 2013-03-30 NOTE — Progress Notes (Signed)
Patient ID: [name], female   DOB: 10-01-1975, 38 y.o.   MRN: [id number]    Subjective: Feels the same. "Still a lot of pain". Localized left upper quadrant. Nauseated without vomiting. Has not had any diarrhea or other bowel movement since admission.  Objective: Vital signs in last 24 hours: Temp:  [97.4 F (36.3 C)-98.4 F (36.9 C)] 97.4 F (36.3 C) (01/08 0605) Pulse Rate:  [55-80] 55 (01/08 0605) Resp:  [14-18] 18 (01/08 0605) BP: (98-130)/(60-78) 122/78 mmHg (01/08 0605) SpO2:  [96 %-100 %] 100 % (01/08 0605) Weight:  [184 lb 12.8 oz (83.825 kg)] 184 lb 12.8 oz (83.825 kg) (01/08 0300) Last BM Date: 03/29/13 (PTA)  Intake/Output from previous day: 01/07 0701 - 01/08 0700 In: 0  Out: 250 [Urine:250] Intake/Output this shift:    General appearance: somewhat  depressed affect, no severe distress. GI: abnormal findings:  mild tenderness in the LUQ and nno guarding and no distention  Lab Results:   Recent Labs  03/27/13 1500 03/30/13 0555  WBC 9.3 8.0  HGB 14.7 12.4  HCT 44.5 38.8  PLT 256 212   BMET  Recent Labs  03/27/13 1500 03/30/13 0555  NA 141 143  K 4.1 3.9  CL 100 108  CO2 28 25  GLUCOSE 100* 98  BUN 9 7  CREATININE 0.59 0.68  CALCIUM 10.0 8.3*     Studies/Results: No results found.  Anti-infectives: Anti-infectives   None      Assessment/Plan: Left upper quadrant pain and nausea. No diarrhea since hospitalized.  Remote history of gastric bypass and history of marginal ulcer. Etiology not clear at this point. Going to go ahead with CT scan of the abdomen today. Endoscopy scheduled for tomorrow.    LOS: 1 day    Devonta Blanford T 03/30/2013

## 2013-03-30 NOTE — Progress Notes (Signed)
INITIAL NUTRITION ASSESSMENT  DOCUMENTATION CODES Per approved criteria  -Not Applicable   INTERVENTION: - Diet advancement per MD - Pt denies any bariatric diet questions, has [facility] contact information if pt has any nutrition questions after discharge - Will continue to monitor   NUTRITION DIAGNOSIS: Inadequate oral intake related to inability to eat as evidenced by NPO.   Goal: Advance diet as tolerated to diabetic diet  Monitor:  Weights, labs, diet advancement, abdominal pain, nausea   Reason for Assessment: Malnutrition screening tool   38 y.o. female  Admitting Dx: LUQ pain and nausea  ASSESSMENT: Admitted with LUQ pain and nausea, etiology unclear, plan is for endoscopy tomorrow. Hx of gastric bypass in 2013, states she has lost 115 pounds since then. Met with pt who reports eating nothing for the past 2 days, before then was eating 3-4 small low fat meals/day. C/o nausea and LUQ pain.   Height: Ht Readings from Last 1 Encounters:  03/30/13 _0  (1.702 m)    Weight: Wt Readings from Last 1 Encounters:  03/30/13 184 lb 12.8 oz (83.825 kg)    Ideal Body Weight: 135 lb   % Ideal Body Weight: 136%  Wt Readings from Last 10 Encounters:  03/30/13 184 lb 12.8 oz (83.825 kg)  03/29/13 184 lb 12.8 oz (83.825 kg)  01/06/13 194 lb 4.8 oz (88.134 kg)  12/08/12 200 lb 1.6 oz (90.765 kg)  12/01/12 200 lb (90.719 kg)  11/29/12 200 lb 8 oz (90.946 kg)  11/09/12 201 lb 12.8 oz (91.536 kg)  11/02/12 198 lb (89.812 kg)  10/04/12 204 lb 12.8 oz (92.897 kg)  09/18/12 208 lb (94.348 kg)    Usual Body Weight: 194 lb 3 months ago  % Usual Body Weight: 95%  BMI:  Body mass index is 28.94 kg/(m^2).  Estimated Nutritional Needs: Kcal: 1600-1800 Protein: 75-95g Fluid: 1.6-1.8L/day  Skin: Intact   Diet Order: NPO  EDUCATION NEEDS: -No education needs identified at this time   Intake/Output Summary (Last 24 hours) at 03/30/13  1140 Last data filed at 03/30/13 1000  Gross per 24 hour  Intake      0 ml  Output    950 ml  Net   -950 ml    Last BM: 1/7  Labs:   Recent Labs Lab 03/27/13 1500 03/30/13 0555  NA 141 143  K 4.1 3.9  CL 100 108  CO2 28 25  BUN 9 7  CREATININE 0.59 0.68  CALCIUM 10.0 8.3*  GLUCOSE 100* 98    CBG (last 3)   Recent Labs  03/29/13 2003  GLUCAP 76    Scheduled Meds: . pantoprazole (PROTONIX) IV  40 mg Intravenous Q12H    Continuous Infusions: . sodium chloride Stopped (03/29/13 2100)  . dextrose 5 % and 0.9 % NaCl with KCl 20 mEq/L 125 mL/hr at 03/30/13 9147    Past Medical History  Diagnosis Date  . Depression   . Anxiety   . Diabetes mellitus   . GERD (gastroesophageal reflux disease)   . ADD (attention deficit disorder)   . Hyperlipemia   . Herniated disc   . Allergy     Seasonal  . Diabetic neuropathy   . Shortness of breath     with exertion   . Sleep apnea     obsructive settings at 13   . Heart murmur     asa child  . Seizures     hx of / month ago - dehydration   .  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 Degenerative disc disease   . Annular disc tear   . Retrolisthesis     L4 and L5  . Bulging disc     Past Surgical History  Procedure Laterality Date  . Cholecystectomy    . Cesarean section    . Tonsillectomy    . Laporoscopy  2007  . Breath tek h pylori  09/11/2011    Procedure: BREATH TEK H PYLORI;  Surgeon: Edward Jolly, MD;  Location: [facility];  Service: General;  Laterality: N/A;  . 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 Bowel resection  11/30/2011    Procedure: SMALL BOWEL RESECTION;  Surgeon: Edward Jolly, MD;  Location: [facility];  Service: General;;  . Esophagogastroduodenoscopy  03/14/2012    Procedure:  ESOPHAGOGASTRODUODENOSCOPY (EGD);  Surgeon: Shann Medal, MD;  Location: [facility];  Service: General;  Laterality: N/A;  . Esophagogastroduodenoscopy  04/25/2012    Procedure: ESOPHAGOGASTRODUODENOSCOPY (EGD);  Surgeon: Shann Medal, MD;  Location: [facility];  Service: General;  Laterality: N/A;    Mikey College MS, [place], [place] Pager [phone] After Hours Pager

## 2013-03-30 NOTE — Care Management Note (Signed)
    Page 1 of 1   03/30/2013     12:34:06 PM   CARE MANAGEMENT NOTE 03/30/2013  Patient:  [name], [name]   Account Number:  [id number]  Date Initiated:  03/30/2013  Documentation initiated by:  Sunday Spillers  Subjective/Objective Assessment:   38 yo female admitted with abd pain. PTA lived at home with spouse.     Action/Plan:   Home when stable   Anticipated DC Date:  04/01/2013   Anticipated DC Plan:  [place]  CM consult      Choice offered to / List presented to:             Status of service:  Completed, signed off Medicare Important Message given?   (If response is "NO", the following Medicare IM given date fields will be blank) Date Medicare IM given:   Date Additional Medicare IM given:    Discharge Disposition:  HOME/SELF CARE  Per UR Regulation:  Reviewed for med. necessity/level of care/duration of stay  If discussed at [place] of Stay Meetings, dates discussed:    Comments:

## 2013-03-31 ENCOUNTER — Encounter (HOSPITAL_COMMUNITY): Payer: Self-pay | Admitting: *Deleted

## 2013-03-31 ENCOUNTER — Encounter (HOSPITAL_COMMUNITY)
Admission: AD | Disposition: A | Discharge status: Home / Self Care | Payer: Self-pay | Source: Ambulatory Visit | Attending: General Surgery

## 2013-03-31 ENCOUNTER — Ambulatory Visit (HOSPITAL_COMMUNITY): Admission: RE | Admit: 2013-03-31 | Payer: BC Managed Care – PPO | Source: Ambulatory Visit | Admitting: Surgery

## 2013-03-31 DIAGNOSIS — Z8711 Personal history of peptic ulcer disease: Secondary | ICD-10-CM | POA: Diagnosis not present

## 2013-03-31 DIAGNOSIS — R1012 Left upper quadrant pain: Secondary | ICD-10-CM | POA: Diagnosis not present

## 2013-03-31 HISTORY — PX: ESOPHAGOGASTRODUODENOSCOPY: SHX5428

## 2013-03-31 LAB — CLOSTRIDIUM DIFFICILE BY PCR: CDIFFPCR: NEGATIVE

## 2013-03-31 SURGERY — EGD (ESOPHAGOGASTRODUODENOSCOPY)
Anesthesia: Moderate Sedation

## 2013-03-31 MED ORDER — FENTANYL CITRATE 0.05 MG/ML IJ SOLN
INTRAMUSCULAR | Status: DC | PRN
  Administered 2013-03-31 (×3): 25 ug via INTRAVENOUS

## 2013-03-31 MED ORDER — BUTAMBEN-TETRACAINE-BENZOCAINE 2-2-14 % EX AERO
INHALATION_SPRAY | CUTANEOUS | Status: DC | PRN
  Administered 2013-03-31: 4 via TOPICAL

## 2013-03-31 MED ORDER — PROMETHAZINE HCL 12.5 MG PO TABS: 12.5000 mg | ORAL_TABLET | Freq: Four times a day (QID) | ORAL | Status: DC | PRN

## 2013-03-31 MED ORDER — MIDAZOLAM HCL 10 MG/2ML IJ SOLN
INTRAMUSCULAR | Status: DC | PRN
  Administered 2013-03-31: 2 mg via INTRAVENOUS
  Administered 2013-03-31: 1 mg via INTRAVENOUS
  Administered 2013-03-31: 2 mg via INTRAVENOUS

## 2013-03-31 NOTE — H&P (View-Only) (Signed)
Patient ID: [name], female   DOB: 10/02/1975, 38 y.o.   MRN: [id number]    Subjective: Feels the same. "Still a lot of pain". Localized left upper quadrant. Nauseated without vomiting. Has not had any diarrhea or other bowel movement since admission.  Objective: Vital signs in last 24 hours: Temp:  [97.4 F (36.3 C)-98.4 F (36.9 C)] 97.4 F (36.3 C) (01/08 0605) Pulse Rate:  [55-80] 55 (01/08 0605) Resp:  [14-18] 18 (01/08 0605) BP: (98-130)/(60-78) 122/78 mmHg (01/08 0605) SpO2:  [96 %-100 %] 100 % (01/08 0605) Weight:  [184 lb 12.8 oz (83.825 kg)] 184 lb 12.8 oz (83.825 kg) (01/08 0300) Last BM Date: 03/29/13 (PTA)  Intake/Output from previous day: 01/07 0701 - 01/08 0700 In: 0  Out: 250 [Urine:250] Intake/Output this shift:    General appearance: somewhat  depressed affect, no severe distress. GI: abnormal findings:  mild tenderness in the LUQ and nno guarding and no distention  Lab Results:   Recent Labs  03/27/13 1500 03/30/13 0555  WBC 9.3 8.0  HGB 14.7 12.4  HCT 44.5 38.8  PLT 256 212   BMET  Recent Labs  03/27/13 1500 03/30/13 0555  NA 141 143  K 4.1 3.9  CL 100 108  CO2 28 25  GLUCOSE 100* 98  BUN 9 7  CREATININE 0.59 0.68  CALCIUM 10.0 8.3*     Studies/Results: No results found.  Anti-infectives: Anti-infectives   None      Assessment/Plan: Left upper quadrant pain and nausea. No diarrhea since hospitalized.  Remote history of gastric bypass and history of marginal ulcer. Etiology not clear at this point. Going to go ahead with CT scan of the abdomen today. Endoscopy scheduled for tomorrow.    LOS: 1 day    [name] T 03/30/2013  

## 2013-03-31 NOTE — Interval H&P Note (Signed)
History and Physical Interval Note:  03/31/2013 7:35 AM  Breck Coons  has presented today for surgery, with the diagnosis of pain   The various methods of treatment have been discussed with the patient and family. Ms. Feldpausch has been endoscoped at least twice before by me.  She has had sudden onset of LUQ pain whose etiology is unclear.  She comes for repeat endoscopy. There is also a note about an allergy to Fentanyl - but I have used Fentanyl on her for both the endos without incident.  After consideration of risks, benefits and other options for treatment, the patient has consented to  Procedure(s): ESOPHAGOGASTRODUODENOSCOPY (EGD) (N/A) as a surgical intervention .  The patient's history has been reviewed, patient examined, no change in status, stable for surgery.  I have reviewed the patient's chart and labs.  Questions were answered to the patient's satisfaction.     Jiraiya Mcewan H

## 2013-03-31 NOTE — Discharge Summary (Signed)
Patient ID: [name] [id number] 37 y.o. 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 She has a previous history of marginal ulcer but this was documented as healed in September of 2014. She has not been on recent medications for this. 2-3 days prior to admission she developed the sudden onset of left upper quadrant abdominal pain associated with nausea and vomiting and diarrhea. She was evaluated in the emergency room with plain x-rays and lab work was unremarkable and was discharged home. I then saw her in the office in followup a day or 2 later with ongoing symptoms at which time she felt lightheaded and due to possible dehydration of ongoing symptoms she is admitted for further evaluation and treatment.  Hospital Course: admission lab work was unremarkable with normal BUN and creatinine and normal CBC. She was treated with IV hydration and symptomatic management. She initially did not have any diarrhea but did have an episode or 2 just prior to discharge which may be due to her CT contrast. Stool studies were obtained and these are pending. She underwent CT scan of the abdomen and pelvis that was entirely normal. She then underwent upper endoscopy by Dr. Lucia Gaskins which was also normal with no obstruction or ulceration formation. On the day of discharge she still not feeling entirely well but is holding liquids down. Again she had some diarrhea just prior to discharge but I suspect this may be secondary to CT contrast. Stool cultures and C. Difficile are pending but I think this diagnosis is  unlikely. I suspect final diagnosis   Significant Diagnostic Studies: normal CT scan lab work and upper endoscopy  Treatments: IV hydration and analgesia:   Disposition: Home  Patient Instructions:    Medication List         ALPRAZolam 1 MG tablet  Commonly known as:  XANAX  Take 1 tablet (1 mg total) by mouth at bedtime as needed for anxiety. Do not mix with narcotic medication or alcohol.     BOTOX IJ  Inject 1 each as directed every 3 (three) months. For migraines     CALCIUM-VITAMIN D PO  Take 3 tablets by mouth daily.     dextroamphetamine 5 MG tablet  Commonly known as:  DEXTROSTAT  Take 10 mg by mouth 2 (two) times daily. 2 BID  Fill after 12/20 /2014     famotidine 20 MG tablet  Commonly known as:  PEPCID  Take 20 mg by mouth 2 (two) times daily.     HYDROmorphone 4 MG tablet  Commonly known as:  DILAUDID  Take by mouth every 4 (four) hours as needed for severe pain.     multivitamin with minerals Tabs tablet  Take 2 tablets by mouth daily.     OLANZapine 2.5 MG tablet  Commonly known as:  ZYPREXA  Take 2.5 mg by mouth 3 (three) times daily as needed (for hemiplegic migraines).     pantoprazole 20 MG tablet  Commonly known as:  PROTONIX  Take 1 tablet (20 mg total) by mouth daily.     promethazine 6.25 MG/5ML syrup  Commonly known as:  PHENERGAN  Take 25 mg by mouth every  6 (six) hours as needed for nausea.     promethazine 6.25 MG/5ML syrup  Commonly known as:  PHENERGAN  TAKE 20 MLS (25 MG TOTAL) BY MOUTH 4 (FOUR) TIMES A DAY AS NEEDED FOR NAUSEA.     promethazine 12.5 MG tablet  Commonly known as:  PHENERGAN  Take 1 tablet (12.5 mg total) by mouth every 6 (six) hours as needed for nausea or vomiting.     sucralfate 1 GM/10ML suspension  Commonly known as:  CARAFATE  Take 10 mLs (1 g total) by mouth 4 (four) times daily -  with meals and at bedtime.     Vitamin B-12 2500 MCG Subl  Place 2,500 mcg under the tongue daily.        Activity:  activity as tolerated Diet: encourage fluids and advance diet as tolerated Wound Care: none needed  Follow-up:  With Dr. Excell Seltzer in 1 week.  Signed: Edward Jolly MD, FACS  03/31/2013, 1:53 PM

## 2013-03-31 NOTE — Op Note (Signed)
03/29/2013 - 03/31/2013  8:13 AM  PATIENT:  [name], 38 y.o., female, MRN: [id number]  PREOP DIAGNOSIS:  Persistent LUQ pain, history of marginal ulcer.   POSTOP DIAGNOSIS:   Normal RYGB anatomy with no evidence of ulcer.  PROCEDURE:  Esophagogastrojejunoscopy  SURGEON:   Alphonsa Overall, M.D.  ANESTHESIA:   Fentanyl  75  mcg   Versed 5 mg  INDICATIONS FOR PROCEDURE:  [name] is a 38 y.o. (DOB: 1975-12-06)  white  female whose primary care physician is Tawanna Solo, MD and comes for upper endoscopy to evaluate LUQ pain.    The patient had a RYGB on 11/30/2011 by Dr. Excell Seltzer.  The patient develops pain early after surgery I documented 2 marginal ulcers on endoscopy on 03/11/2012.    The patient treated this medically and I did a repeat upper endo on 04/24/2012 which showed one ulcer healed and the other 80% healed.  She underwent another upper endo by Dr. Paulita Fujita in September 2014 (I do not have his op note) which according to Dr. Lear Ng note showed no ulcer.  Ms. Curnow was admitted 03/29/2013 for LUQ pain.  A CT scan yesterday show no obvious source of her pain.  She comes for repeat upper endo.  Of note, she has been on chronic dilaudid and xanax for back pain for several months.  She has seen Dr. Vira Blanco at the pain clinic who is planning to place a nerve stimulator to try to control her pain.   The indications and risks of the endoscopy were explained to the patient.  The risks include, but are not limited to, perforation, bleeding, or injury to the bowel.  If balloon dilatation is needed, the risk of perforation is higher.  PROCEDURE:  The patient was in room 4 at [facility] endoscopy unit.  The patient was monitored with a pulse oximetry, BP cuff, and EKG.  The patient has nasal O2 flowing during the procedure.   The back of the throat was anesthestized with Ceticaine x 3.  The patient was positioned in the left lateral decubitus position.  The patient was given Fentanyl and Versed.   Note, there are comments in the chart about her having an allergy to Fentanyl, but she has had this before and tolerated it well today.  A flexible Pentax endoscope was passed down the throat without difficulty.   Findings include:   Esophagus:   Normal   GE junction at:  39 cm   Stomach pouch: Normal.   Gastrojejunal anastomosis:   43 cm.  I could identify no obvious ulcer.   Efferent jejunal limb:  I went down 30 cm and this looked normal. Afferent jejunal limb:  Normal   CLO test:  Obtained, results pending.  PLAN:   Photos taken and given to patient.   The patient is admitted to the hospital.  Will start diet.  Alphonsa Overall, MD, [facility] Pager: [phone] Office phone:  [phone]

## 2013-03-31 NOTE — Discharge Instructions (Signed)
Push by mouth fluids.

## 2013-04-01 LAB — CLOTEST (H. PYLORI), BIOPSY: Helicobacter screen: NEGATIVE

## 2013-04-04 ENCOUNTER — Encounter (HOSPITAL_COMMUNITY): Payer: Self-pay | Admitting: Surgery

## 2013-04-04 DIAGNOSIS — M79609 Pain in unspecified limb: Secondary | ICD-10-CM | POA: Diagnosis not present

## 2013-04-04 DIAGNOSIS — G894 Chronic pain syndrome: Secondary | ICD-10-CM | POA: Diagnosis not present

## 2013-04-04 DIAGNOSIS — M5137 Other intervertebral disc degeneration, lumbosacral region: Secondary | ICD-10-CM | POA: Diagnosis not present

## 2013-04-04 DIAGNOSIS — IMO0002 Reserved for concepts with insufficient information to code with codable children: Secondary | ICD-10-CM | POA: Diagnosis not present

## 2013-04-04 LAB — STOOL CULTURE: SPECIAL REQUESTS: NORMAL

## 2013-04-05 ENCOUNTER — Telehealth (HOSPITAL_COMMUNITY): Payer: Self-pay

## 2013-04-05 MED ORDER — LORAZEPAM 0.5 MG PO TABS: 0.5000 mg | ORAL_TABLET | Freq: Two times a day (BID) | ORAL | Status: DC

## 2013-04-06 ENCOUNTER — Ambulatory Visit (INDEPENDENT_AMBULATORY_CARE_PROVIDER_SITE_OTHER): Payer: BC Managed Care – PPO | Admitting: General Surgery

## 2013-04-06 VITALS — BP 134/76 | HR 77 | Temp 97.0°F | Resp 18 | Ht 67.0 in | Wt 183.6 lb

## 2013-04-06 DIAGNOSIS — Z9884 Bariatric surgery status: Secondary | ICD-10-CM

## 2013-04-06 NOTE — Progress Notes (Signed)
Chief complaint: Followup gastric bypass and recent hospitalization  History: patient returns for further followup. As per previous notes she was recently hospitalized with acute left upper quadrant abdominal pain nausea and frequent diarrhea. She had an extensive workup including CT scan and upper endoscopy that were negative. Lab work was essentially unremarkable as were stool studies including cultures and C. Difficile. The patient gradually improved in the hospital with symptomatic management and was discharged about a week ago. Since being at home she has continued to gradually improve. She states she has had a couple of "flareups" left upper quadrant pain that was fairly brief and unrelated to needing her activity or any other precipitating activities. She still having some diarrhea bowel movements about twice a day which is significantly less frequent.  Exam: BP 134/76  Pulse 77  Temp(Src) 97 F (36.1 C)  Resp 18  Ht 5\' 7"  (1.702 m)  Wt 183 lb 9.6 oz (83.28 kg)  BMI 28.75 kg/m2 General: Appears well and much better than at the time of her hospitalization Skin: No rash or infection Lungs: Clear breath sounds Abdomen: Soft with minimal epigastric tenderness and no guarding or distention  Assessment and plan: Recent episode of abdominal pain and diarrhea and nausea and vomiting history gastric bypass. Extensive workup negative. Possible gastroenteritis. She is improving we will continue observation for now. I told her she continues to have pain and diarrhea we could consider a GI referral for possible colonoscopy. However if she is improving will observe for now. I gave her an appointment for 6 months at the latest.

## 2013-04-10 NOTE — Telephone Encounter (Signed)
Called and spoke with patient. She wants weekly sessions and is unable to schedule these. Informed patient that this writer is leaving. Will speak with other therapists to see about availability and will call patient back with recommendations.

## 2013-04-13 DIAGNOSIS — B9789 Other viral agents as the cause of diseases classified elsewhere: Secondary | ICD-10-CM | POA: Diagnosis not present

## 2013-04-18 ENCOUNTER — Telehealth (HOSPITAL_COMMUNITY): Payer: Self-pay | Admitting: *Deleted

## 2013-04-18 NOTE — Telephone Encounter (Signed)
Problems with anxiety. Not sleeping. Informed her MD will be given message tomorrow.Wants to know if MD will be called today.Advised her if this emergency, go to ED.Pt states it is not an emergency.Has appt 04/27/13. Pt also states her therapist Erasmo Downer is leaving and she has not heard who she is to see.

## 2013-04-21 ENCOUNTER — Encounter (HOSPITAL_COMMUNITY): Payer: Self-pay | Admitting: Emergency Medicine

## 2013-04-21 ENCOUNTER — Emergency Department (HOSPITAL_COMMUNITY)
Admission: EM | Admit: 2013-04-21 | Discharge: 2013-04-21 | Disposition: A | Discharge status: Home / Self Care | Payer: BC Managed Care – PPO | Attending: Emergency Medicine | Admitting: Emergency Medicine

## 2013-04-21 DIAGNOSIS — F329 Major depressive disorder, single episode, unspecified: Secondary | ICD-10-CM | POA: Diagnosis not present

## 2013-04-21 DIAGNOSIS — E1142 Type 2 diabetes mellitus with diabetic polyneuropathy: Secondary | ICD-10-CM | POA: Insufficient documentation

## 2013-04-21 DIAGNOSIS — E1149 Type 2 diabetes mellitus with other diabetic neurological complication: Secondary | ICD-10-CM | POA: Insufficient documentation

## 2013-04-21 DIAGNOSIS — F3289 Other specified depressive episodes: Secondary | ICD-10-CM | POA: Insufficient documentation

## 2013-04-21 DIAGNOSIS — G43909 Migraine, unspecified, not intractable, without status migrainosus: Secondary | ICD-10-CM | POA: Diagnosis not present

## 2013-04-21 DIAGNOSIS — Z880 Allergy status to penicillin: Secondary | ICD-10-CM | POA: Diagnosis not present

## 2013-04-21 DIAGNOSIS — R1084 Generalized abdominal pain: Secondary | ICD-10-CM | POA: Diagnosis not present

## 2013-04-21 DIAGNOSIS — R1110 Vomiting, unspecified: Secondary | ICD-10-CM | POA: Diagnosis not present

## 2013-04-21 DIAGNOSIS — Z79899 Other long term (current) drug therapy: Secondary | ICD-10-CM | POA: Diagnosis not present

## 2013-04-21 DIAGNOSIS — Z8739 Personal history of other diseases of the musculoskeletal system and connective tissue: Secondary | ICD-10-CM | POA: Insufficient documentation

## 2013-04-21 DIAGNOSIS — R011 Cardiac murmur, unspecified: Secondary | ICD-10-CM | POA: Diagnosis not present

## 2013-04-21 DIAGNOSIS — Z8719 Personal history of other diseases of the digestive system: Secondary | ICD-10-CM | POA: Insufficient documentation

## 2013-04-21 DIAGNOSIS — R197 Diarrhea, unspecified: Secondary | ICD-10-CM | POA: Diagnosis not present

## 2013-04-21 DIAGNOSIS — G8929 Other chronic pain: Secondary | ICD-10-CM | POA: Diagnosis not present

## 2013-04-21 DIAGNOSIS — F411 Generalized anxiety disorder: Secondary | ICD-10-CM | POA: Insufficient documentation

## 2013-04-21 DIAGNOSIS — F988 Other specified behavioral and emotional disorders with onset usually occurring in childhood and adolescence: Secondary | ICD-10-CM | POA: Diagnosis not present

## 2013-04-21 LAB — COMPREHENSIVE METABOLIC PANEL
ALT: 41 U/L — AB (ref 0–35)
AST: 36 U/L (ref 0–37)
Albumin: 4.6 g/dL (ref 3.5–5.2)
Alkaline Phosphatase: 82 U/L (ref 39–117)
BILIRUBIN TOTAL: 1 mg/dL (ref 0.3–1.2)
BUN: 7 mg/dL (ref 6–23)
CHLORIDE: 101 meq/L (ref 96–112)
CO2: 23 mEq/L (ref 19–32)
Calcium: 9.6 mg/dL (ref 8.4–10.5)
Creatinine, Ser: 0.69 mg/dL (ref 0.50–1.10)
GFR calc Af Amer: >90 mL/min (ref >90)
Glucose, Bld: 96 mg/dL (ref 70–99)
POTASSIUM: 4.1 meq/L (ref 3.7–5.3)
Sodium: 140 mEq/L (ref 137–147)
Total Protein: 8.1 g/dL (ref 6.0–8.3)

## 2013-04-21 LAB — CBC WITH DIFFERENTIAL/PLATELET
BASOS ABS: 0 10*3/uL (ref 0.0–0.1)
Basophils Relative: 0 % (ref 0–1)
Eosinophils Absolute: 0 10*3/uL (ref 0.0–0.7)
Eosinophils Relative: 0 % (ref 0–5)
HCT: 44.3 % (ref 36.0–46.0)
Hemoglobin: 15.1 g/dL — ABNORMAL HIGH (ref 12.0–15.0)
LYMPHS ABS: 2 10*3/uL (ref 0.7–4.0)
Lymphocytes Relative: 20 % (ref 12–46)
MCH: 27.8 pg (ref 26.0–34.0)
MCHC: 34.1 g/dL (ref 30.0–36.0)
MCV: 81.6 fL (ref 78.0–100.0)
Monocytes Absolute: 0.4 10*3/uL (ref 0.1–1.0)
Monocytes Relative: 4 % (ref 3–12)
NEUTROS ABS: 7.9 10*3/uL — AB (ref 1.7–7.7)
Neutrophils Relative %: 76 % (ref 43–77)
Platelets: 290 10*3/uL (ref 150–400)
RBC: 5.43 MIL/uL — AB (ref 3.87–5.11)
RDW: 13.5 % (ref 11.5–15.5)
WBC: 10.4 10*3/uL (ref 4.0–10.5)

## 2013-04-21 LAB — URINALYSIS, ROUTINE W REFLEX MICROSCOPIC
BILIRUBIN URINE: NEGATIVE
Glucose, UA: NEGATIVE mg/dL
KETONES UR: 40 mg/dL — AB
Nitrite: NEGATIVE
Protein, ur: NEGATIVE mg/dL
SPECIFIC GRAVITY, URINE: 1.012 (ref 1.005–1.030)
UROBILINOGEN UA: 0.2 mg/dL (ref 0.0–1.0)
pH: 6 (ref 5.0–8.0)

## 2013-04-21 LAB — URINE MICROSCOPIC-ADD ON

## 2013-04-21 LAB — CLOSTRIDIUM DIFFICILE BY PCR: Toxigenic C. Difficile by PCR: NEGATIVE

## 2013-04-21 LAB — OCCULT BLOOD, POC DEVICE: FECAL OCCULT BLD: POSITIVE — AB

## 2013-04-21 MED ORDER — PROMETHAZINE HCL 25 MG/ML IJ SOLN
25.0000 mg | Freq: Once | INTRAMUSCULAR | Status: AC
  Administered 2013-04-21: 25 mg via INTRAVENOUS
  Filled 2013-04-21: qty 1

## 2013-04-21 MED ORDER — SODIUM CHLORIDE 0.9 % IV BOLUS (SEPSIS)
2000.0000 mL | Freq: Once | INTRAVENOUS | Status: AC
  Administered 2013-04-21: 2000 mL via INTRAVENOUS

## 2013-04-21 MED ORDER — PROMETHAZINE HCL 25 MG RE SUPP: 25.0000 mg | Freq: Four times a day (QID) | RECTAL | Status: DC | PRN

## 2013-04-21 MED ORDER — PROMETHAZINE HCL 25 MG RE SUPP
25.0000 mg | Freq: Four times a day (QID) | RECTAL | Status: DC | PRN
  Filled 2013-04-21 (×2): qty 1

## 2013-04-21 MED ORDER — SODIUM CHLORIDE 0.9 % IV SOLN
INTRAVENOUS | Status: DC
  Administered 2013-04-21: 12:00:00 via INTRAVENOUS

## 2013-04-21 NOTE — ED Provider Notes (Signed)
CSN: [id number]     Arrival date & time 04/21/13  0806 History   First MD Initiated Contact with Patient 04/21/13 [phone]     Chief Complaint  Patient presents with  . Emesis  . Diarrhea   (Consider location/radiation/quality/duration/timing/severity/associated sxs/prior Treatment) Patient is a 38 y.o. female presenting with vomiting and diarrhea. The history is provided by the patient.  Emesis Associated symptoms: diarrhea   Diarrhea Associated symptoms: vomiting    She reports ill for 2 weeks, with vomiting, and diarrhea. Stool is red in color. The emesis is clear with occasional red color. She has mild, diffuse, cramping abdominal pain. She denies fever or chills, cough, shortness of breath, chest pain. She feels somewhat weak when standing. She was recently in emergency department and treated for abdominal pain. She has chronic pain. She has numerous allergies. There are no other known modifying factors.  Past Medical History  Diagnosis Date  . Depression   . Anxiety   . Diabetes mellitus   . GERD (gastroesophageal reflux disease)   . ADD (attention deficit disorder)   . Hyperlipemia   . Herniated disc   . Allergy     Seasonal  . Diabetic neuropathy   . Shortness of breath     with exertion   . Sleep apnea     obsructive settings at 13   . Heart murmur     asa child  . Seizures     hx of / month ago - dehydration   . 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 Degenerative disc disease   . Annular disc tear   . Retrolisthesis     L4 and L5  . Bulging disc   . Chronic pain 04/20/13    Medication prescribed by Vira Blanco, M.D.   Past Surgical History  Procedure Laterality Date  . Cholecystectomy    . Cesarean section    . Tonsillectomy    . Laporoscopy  2007  . Breath tek h pylori  09/11/2011    Procedure: BREATH TEK H PYLORI;  Surgeon: Edward Jolly, MD;  Location: [facility];  Service: General;  Laterality: N/A;   . 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 Bowel resection  11/30/2011    Procedure: SMALL BOWEL RESECTION;  Surgeon: Edward Jolly, MD;  Location: [facility];  Service: General;;  . Esophagogastroduodenoscopy  03/14/2012    Procedure: ESOPHAGOGASTRODUODENOSCOPY (EGD);  Surgeon: Shann Medal, MD;  Location: [facility];  Service: General;  Laterality: N/A;  . Esophagogastroduodenoscopy  04/25/2012    Procedure: ESOPHAGOGASTRODUODENOSCOPY (EGD);  Surgeon: Shann Medal, MD;  Location: [facility];  Service: General;  Laterality: N/A;  . Esophagogastroduodenoscopy N/A 03/31/2013    Procedure: ESOPHAGOGASTRODUODENOSCOPY (EGD);  Surgeon: Shann Medal, MD;  Location: [facility];  Service: General;  Laterality: N/A;   Family History  Problem Relation Age of Onset  . Diabetes type II    . Diabetes Mother   . Hypertension Mother   . Depression Mother   . OCD Mother   . Depression Brother   . Cancer Maternal Aunt     ovarian   . Depression Maternal Aunt   . Diabetes Maternal Aunt   . Cancer Maternal Grandmother     Lung  . COPD Maternal Grandmother   .  Cancer Maternal Grandfather     Prostate  . Diabetes Maternal Grandfather   . Hearing loss Maternal Grandfather   . Hypertension Maternal Grandfather   . Stroke Maternal Grandfather   . Cancer Paternal Grandmother     Colon   History  Substance Use Topics  . Smoking status: Never Smoker   . Smokeless tobacco: Never Used  . Alcohol Use: No   OB History   Grav Para Term Preterm Abortions TAB SAB Ect Mult Living                 Review of Systems  Gastrointestinal: Positive for vomiting and diarrhea.  All other systems reviewed and are negative.    Allergies  Ace inhibitors; Atarax; Cephalosporins; Codeine; Compazine; Decadron; Demerol; Metoclopramide hcl; Morphine and related; Other; Penicillins;  Pork-derived products; Prozac; Shellfish-derived products; Sulfa antibiotics; Triptans; Zanaflex; Zofran; Dihydroergotamine; and Periactin  Home Medications   Current Outpatient Rx  Name  Route  Sig  Dispense  Refill  . ALPRAZolam (XANAX) 1 MG tablet   Oral   Take 1 tablet (1 mg total) by mouth at bedtime as needed for anxiety. Do not mix with narcotic medication or alcohol.   30 tablet   4   . CALCIUM-VITAMIN D PO   Oral   Take 3 tablets by mouth daily.         . Cyanocobalamin (VITAMIN B-12) 2500 MCG SUBL   Sublingual   Place 2,500 mcg under the tongue daily.         Marland Kitchen dextroamphetamine (DEXTROSTAT) 5 MG tablet   Oral   Take 10 mg by mouth 2 (two) times daily. 2 BID  Fill after 12/20 /2014         . HYDROmorphone (DILAUDID) 4 MG tablet   Oral   Take by mouth every 4 (four) hours as needed for severe pain.         Marland Kitchen LORazepam (ATIVAN) 0.5 MG tablet   Oral   Take 1 tablet (0.5 mg total) by mouth 2 (two) times daily. 1 bid   60 tablet   2   . Multiple Vitamin (MULTIVITAMIN WITH MINERALS) TABS tablet   Oral   Take 2 tablets by mouth daily.         Marland Kitchen OLANZapine (ZYPREXA) 2.5 MG tablet   Oral   Take 2.5 mg by mouth 3 (three) times daily as needed (for hemiplegic migraines).         . promethazine (PHENERGAN) 6.25 MG/5ML syrup   Oral   Take 25 mg by mouth every 6 (six) hours as needed for nausea.         . OnabotulinumtoxinA (BOTOX IJ)   Injection   Inject 1 each as directed every 3 (three) months. For migraines         . promethazine (PHENERGAN) 25 MG suppository   Rectal   Place 1 suppository (25 mg total) rectally every 6 (six) hours as needed for nausea or vomiting.   12 each   0    BP 127/69  Pulse 73  Temp(Src) 98.4 F (36.9 C) (Oral)  Resp 16  SpO2 98% Physical Exam  Nursing note and vitals reviewed. Constitutional: She is oriented to person, place, and time. She appears well-developed and well-nourished.  HENT:  Head: Normocephalic  and atraumatic.  Eyes: Conjunctivae and EOM are normal. Pupils are equal, round, and reactive to light.  Neck: Normal range of motion and phonation normal. Neck supple.  Cardiovascular: Normal rate, regular  rhythm and intact distal pulses.   Pulmonary/Chest: Effort normal and breath sounds normal. She exhibits no tenderness.  Abdominal: Soft. She exhibits no distension. There is tenderness (Left upper quadrant, mild). There is no guarding.  Musculoskeletal: Normal range of motion.  Neurological: She is alert and oriented to person, place, and time. She exhibits normal muscle tone.  Skin: Skin is warm and dry.  Psychiatric: She has a normal mood and affect. Her behavior is normal. Judgment and thought content normal.    ED Course  Procedures (including critical care time)  Medications  0.9 %  sodium chloride infusion ( Intravenous New Bag/Given 04/21/13 1140)  promethazine (PHENERGAN) suppository 25 mg (not administered)  sodium chloride 0.9 % bolus 2,000 mL (0 mLs Intravenous Stopped 04/21/13 1140)  promethazine (PHENERGAN) injection 25 mg (25 mg Intravenous Given 04/21/13 0952)    Patient Vitals for the past 24 hrs:  BP Temp Temp src Pulse Resp SpO2  04/21/13 1253 127/69 mmHg 98.4 F (36.9 C) Oral 73 16 98 %  04/21/13 0813 116/67 mmHg 97.8 F (36.6 C) Oral 85 17 98 %    2:06 PM Reevaluation with update and discussion. After initial assessment and treatment, an updated evaluation reveals she is comfortable. She is tolerating oral liquids here. She has not been able to produce a stool yet. She requested another dose of IV, Phenergan. I offered her a Phenergan suppository. She would also like to have some to use at home.Daleen Bo L     Labs Review Labs Reviewed  CBC WITH DIFFERENTIAL - Abnormal; Notable for the following:    RBC 5.43 (*)    Hemoglobin 15.1 (*)    Neutro Abs 7.9 (*)    All other components within normal limits  COMPREHENSIVE METABOLIC PANEL - Abnormal;  Notable for the following:    ALT 41 (*)    All other components within normal limits  URINALYSIS, ROUTINE W REFLEX MICROSCOPIC - Abnormal; Notable for the following:    Hgb urine dipstick TRACE (*)    Ketones, ur 40 (*)    Leukocytes, UA SMALL (*)    All other components within normal limits  OCCULT BLOOD, POC DEVICE - Abnormal; Notable for the following:    Fecal Occult Bld POSITIVE (*)    All other components within normal limits  URINE CULTURE  STOOL CULTURE  CLOSTRIDIUM DIFFICILE BY PCR  URINE MICROSCOPIC-ADD ON   Imaging Review No results found.  EKG Interpretation   None       MDM   1. Vomiting and diarrhea   2. Chronic pain      Nonspecific, vomiting, and diarrhea with patient complaint of dehydration, but reassuring laboratory evaluation. The urine, specific gravity is 1.012, normal. She has a history of chronic pain. There is no indication for further treatment or evaluation in the emergency department setting   Nursing Notes Reviewed/ Care Coordinated Applicable Imaging Reviewed Interpretation of Laboratory Data incorporated into ED treatment  The patient appears reasonably screened and/or stabilized for discharge and I doubt any other medical condition or other [facility] requiring further screening, evaluation, or treatment in the ED at this time prior to discharge.  Plan: Home Medications- Phenergan suppository; Home Treatments- gradually advance diet; return here if the recommended treatment, does not improve the symptoms; Recommended follow up- PCP as needed, return with stool sample when his pain     Richarda Blade, MD 04/21/13 1414

## 2013-04-21 NOTE — ED Notes (Signed)
Patient also c/o migraine headache that began this AM and denies light and sound sensitivity.

## 2013-04-21 NOTE — ED Notes (Signed)
Pt from home c/o N/VD x10 days. Pt states that she is unable to keep fluids down. Pt is A&O and in NAD

## 2013-04-21 NOTE — ED Notes (Signed)
Gave patient two hot packets per MD.

## 2013-04-21 NOTE — Discharge Instructions (Signed)
Return the stool sample to the lab, when you're able to collect it.   Diarrhea Diarrhea is frequent loose and watery bowel movements. It can cause you to feel weak and dehydrated. Dehydration can cause you to become tired and thirsty, have a dry mouth, and have decreased urination that often is dark yellow. Diarrhea is a sign of another problem, most often an infection that will not last long. In most cases, diarrhea typically lasts 2 3 days. However, it can last longer if it is a sign of something more serious. It is important to treat your diarrhea as directed by your caregive to lessen or prevent future episodes of diarrhea. CAUSES  Some common causes include:  Gastrointestinal infections caused by viruses, bacteria, or parasites.  Food poisoning or food allergies.  Certain medicines, such as antibiotics, chemotherapy, and laxatives.  Artificial sweeteners and fructose.  Digestive disorders. HOME CARE INSTRUCTIONS  Ensure adequate fluid intake (hydration): have 1 cup (8 oz) of fluid for each diarrhea episode. Avoid fluids that contain simple sugars or sports drinks, fruit juices, whole milk products, and sodas. Your urine should be clear or pale yellow if you are drinking enough fluids. Hydrate with an oral rehydration solution that you can purchase at pharmacies, retail stores, and online. You can prepare an oral rehydration solution at home by mixing the following ingredients together:    tsp table salt.   tsp baking soda.   tsp salt substitute containing potassium chloride.  1  tablespoons sugar.  1 L (34 oz) of water.  Certain foods and beverages may increase the speed at which food moves through the gastrointestinal (GI) tract. These foods and beverages should be avoided and include:  Caffeinated and alcoholic beverages.  High-fiber foods, such as raw fruits and vegetables, nuts, seeds, and whole grain breads and cereals.  Foods and beverages sweetened with sugar  alcohols, such as xylitol, sorbitol, and mannitol.  Some foods may be well tolerated and may help thicken stool including:  Starchy foods, such as rice, toast, pasta, low-sugar cereal, oatmeal, grits, baked potatoes, crackers, and bagels.  Bananas.  Applesauce.  Add probiotic-rich foods to help increase healthy bacteria in the GI tract, such as yogurt and fermented milk products.  Wash your hands well after each diarrhea episode.  Only take over-the-counter or prescription medicines as directed by your caregiver.  Take a warm bath to relieve any burning or pain from frequent diarrhea episodes. SEEK IMMEDIATE MEDICAL CARE IF:   You are unable to keep fluids down.  You have persistent vomiting.  You have blood in your stool, or your stools are black and tarry.  You do not urinate in 6 8 hours, or there is only a small amount of very dark urine.  You have abdominal pain that increases or localizes.  You have weakness, dizziness, confusion, or lightheadedness.  You have a severe headache.  Your diarrhea gets worse or does not get better.  You have a fever or persistent symptoms for more than 2 3 days.  You have a fever and your symptoms suddenly get worse. MAKE SURE YOU:   Understand these instructions.  Will watch your condition.  Will get help right away if you are not doing well or get worse. Document Released: 02/27/2002 Document Revised: 02/24/2012 Document Reviewed: 11/15/2011 [facility] Patient Information 2014 [place], [place].  Diet for Diarrhea, Adult Frequent, runny stools (diarrhea) may be caused or worsened by food or drink. Diarrhea may be relieved by changing your diet. Since diarrhea  can last up to 7 days, it is easy for you to lose too much fluid from the body and become dehydrated. Fluids that are lost need to be replaced. Along with a modified diet, make sure you drink enough fluids to keep your urine clear or pale yellow. DIET INSTRUCTIONS  Ensure  adequate fluid intake (hydration): have 1 cup (8 oz) of fluid for each diarrhea episode. Avoid fluids that contain simple sugars or sports drinks, fruit juices, whole milk products, and sodas. Your urine should be clear or pale yellow if you are drinking enough fluids. Hydrate with an oral rehydration solution that you can purchase at pharmacies, retail stores, and online. You can prepare an oral rehydration solution at home by mixing the following ingredients together:    tsp table salt.   tsp baking soda.   tsp salt substitute containing potassium chloride.  1  tablespoons sugar.  1 L (34 oz) of water.  Certain foods and beverages may increase the speed at which food moves through the gastrointestinal (GI) tract. These foods and beverages should be avoided and include:  Caffeinated and alcoholic beverages.  High-fiber foods, such as raw fruits and vegetables, nuts, seeds, and whole grain breads and cereals.  Foods and beverages sweetened with sugar alcohols, such as xylitol, sorbitol, and mannitol.  Some foods may be well tolerated and may help thicken stool including:  Starchy foods, such as rice, toast, pasta, low-sugar cereal, oatmeal, grits, baked potatoes, crackers, and bagels.   Bananas.   Applesauce.  Add probiotic-rich foods to help increase healthy bacteria in the GI tract, such as yogurt and fermented milk products. RECOMMENDED FOODS AND BEVERAGES Starches Choose foods with less than 2 g of fiber per serving.  Recommended:  White, [place], and pita breads, plain rolls, buns, bagels. Plain muffins, matzo. Soda, saltine, or graham crackers. Pretzels, melba toast, zwieback. Cooked cereals made with water: cornmeal, farina, cream cereals. Dry cereals: refined corn, wheat, rice. Potatoes prepared any way without skins, refined macaroni, spaghetti, noodles, refined rice.  Avoid:  Bread, rolls, or crackers made with whole wheat, multi-grains, rye, bran seeds, nuts, or  coconut. Corn tortillas or taco shells. Cereals containing whole grains, multi-grains, bran, coconut, nuts, raisins. Cooked or dry oatmeal. Coarse wheat cereals, granola. Cereals advertised as "high-fiber." Potato skins. Whole grain pasta, wild or brown rice. Popcorn. Sweet potatoes, yams. Sweet rolls, doughnuts, waffles, pancakes, sweet breads. Vegetables  Recommended: Strained tomato and vegetable juices. Most well-cooked and canned vegetables without seeds. Fresh: Tender lettuce, cucumber without the skin, cabbage, spinach, bean sprouts.  Avoid: Fresh, cooked, or canned: Artichokes, baked beans, beet greens, broccoli, Brussels sprouts, corn, kale, legumes, peas, sweet potatoes. Cooked: Green or red cabbage, spinach. Avoid large servings of any vegetables because vegetables shrink when cooked, and they contain more fiber per serving than fresh vegetables. Fruit  Recommended: Cooked or canned: Apricots, applesauce, cantaloupe, cherries, fruit cocktail, grapefruit, grapes, kiwi, mandarin oranges, peaches, pears, plums, watermelon. Fresh: Apples without skin, ripe banana, grapes, cantaloupe, cherries, grapefruit, peaches, oranges, plums. Keep servings limited to  cup or 1 piece.  Avoid: Fresh: Apples with skin, apricots, mangoes, pears, raspberries, strawberries. Prune juice, stewed or dried prunes. Dried fruits, raisins, dates. Large servings of all fresh fruits. Protein  Recommended: Ground or well-cooked tender beef, ham, veal, lamb, pork, or poultry. Eggs. Fish, oysters, shrimp, lobster, other seafoods. Liver, organ meats.  Avoid: Tough, fibrous meats with gristle. Peanut butter, smooth or chunky. Cheese, nuts, seeds, legumes, dried peas, beans, lentils. Dairy  Recommended: Yogurt, lactose-free milk, kefir, drinkable yogurt, buttermilk, soy milk, or plain hard cheese.  Avoid: Milk, chocolate milk, beverages made with milk, such as milkshakes. Soups  Recommended: Bouillon, broth, or soups  made from allowed foods. Any strained soup.  Avoid: Soups made from vegetables that are not allowed, cream or milk-based soups. Desserts and Sweets  Recommended: Sugar-free gelatin, sugar-free frozen ice pops made without sugar alcohol.  Avoid: Plain cakes and cookies, pie made with fruit, pudding, custard, cream pie. Gelatin, fruit, ice, sherbet, frozen ice pops. Ice cream, ice milk without nuts. Plain hard candy, honey, jelly, molasses, syrup, sugar, chocolate syrup, gumdrops, marshmallows. Fats and Oils  Recommended: Limit fats to less than 8 tsp per day.  Avoid: Seeds, nuts, olives, avocados. Margarine, butter, cream, mayonnaise, salad oils, plain salad dressings. Plain gravy, crisp bacon without rind. Beverages  Recommended: Water, decaffeinated teas, oral rehydration solutions, sugar-free beverages not sweetened with sugar alcohols.  Avoid: Fruit juices, caffeinated beverages (coffee, tea, soda), alcohol, sports drinks, or lemon-lime soda. Condiments  Recommended: Ketchup, mustard, horseradish, vinegar, cocoa powder. Spices in moderation: allspice, basil, bay leaves, celery powder or leaves, cinnamon, cumin powder, curry powder, ginger, mace, marjoram, onion or garlic powder, oregano, paprika, parsley flakes, ground pepper, rosemary, sage, savory, tarragon, thyme, turmeric.  Avoid: Coconut, honey. Document Released: 05/30/2003 Document Revised: 12/02/2011 Document Reviewed: 07/24/2011 [facility] Patient Information 2014 [place].  Nausea and Vomiting Nausea is a sick feeling that often comes before throwing up (vomiting). Vomiting is a reflex where stomach contents come out of your mouth. Vomiting can cause severe loss of body fluids (dehydration). Children and elderly adults can become dehydrated quickly, especially if they also have diarrhea. Nausea and vomiting are symptoms of a condition or disease. It is important to find the cause of your symptoms. CAUSES   Direct  irritation of the stomach lining. This irritation can result from increased acid production (gastroesophageal reflux disease), infection, food poisoning, taking certain medicines (such as nonsteroidal anti-inflammatory drugs), alcohol use, or tobacco use.  Signals from the brain.These signals could be caused by a headache, heat exposure, an inner ear disturbance, increased pressure in the brain from injury, infection, a tumor, or a concussion, pain, emotional stimulus, or metabolic problems.  An obstruction in the gastrointestinal tract (bowel obstruction).  Illnesses such as diabetes, hepatitis, gallbladder problems, appendicitis, kidney problems, cancer, sepsis, atypical symptoms of a heart attack, or eating disorders.  Medical treatments such as chemotherapy and radiation.  Receiving medicine that makes you sleep (general anesthetic) during surgery. DIAGNOSIS Your caregiver may ask for tests to be done if the problems do not improve after a few days. Tests may also be done if symptoms are severe or if the reason for the nausea and vomiting is not clear. Tests may include:  Urine tests.  Blood tests.  Stool tests.  Cultures (to look for evidence of infection).  X-rays or other imaging studies. Test results can help your caregiver make decisions about treatment or the need for additional tests. TREATMENT You need to stay well hydrated. Drink frequently but in small amounts.You may wish to drink water, sports drinks, clear broth, or eat frozen ice pops or gelatin dessert to help stay hydrated.When you eat, eating slowly may help prevent nausea.There are also some antinausea medicines that may help prevent nausea. HOME CARE INSTRUCTIONS   Take all medicine as directed by your caregiver.  If you do not have an appetite, do not force yourself to eat. However, you must continue to  drink fluids.  If you have an appetite, eat a normal diet unless your caregiver tells you  differently.  Eat a variety of complex carbohydrates (rice, wheat, potatoes, bread), lean meats, yogurt, fruits, and vegetables.  Avoid high-fat foods because they are more difficult to digest.  Drink enough water and fluids to keep your urine clear or pale yellow.  If you are dehydrated, ask your caregiver for specific rehydration instructions. Signs of dehydration may include:  Severe thirst.  Dry lips and mouth.  Dizziness.  Dark urine.  Decreasing urine frequency and amount.  Confusion.  Rapid breathing or pulse. SEEK IMMEDIATE MEDICAL CARE IF:   You have blood or brown flecks (like coffee grounds) in your vomit.  You have black or bloody stools.  You have a severe headache or stiff neck.  You are confused.  You have severe abdominal pain.  You have chest pain or trouble breathing.  You do not urinate at least once every 8 hours.  You develop cold or clammy skin.  You continue to vomit for longer than 24 to 48 hours.  You have a fever. MAKE SURE YOU:   Understand these instructions.  Will watch your condition.  Will get help right away if you are not doing well or get worse. Document Released: 03/09/2005 Document Revised: 06/01/2011 Document Reviewed: 08/06/2010 [facility] Patient Information 2014 [place], [place].

## 2013-04-22 LAB — URINE CULTURE

## 2013-04-25 DIAGNOSIS — G43709 Chronic migraine without aura, not intractable, without status migrainosus: Secondary | ICD-10-CM | POA: Diagnosis not present

## 2013-04-25 LAB — STOOL CULTURE

## 2013-04-26 ENCOUNTER — Ambulatory Visit (INDEPENDENT_AMBULATORY_CARE_PROVIDER_SITE_OTHER): Payer: BC Managed Care – PPO | Admitting: Licensed Clinical Social Worker

## 2013-04-26 DIAGNOSIS — F411 Generalized anxiety disorder: Secondary | ICD-10-CM | POA: Insufficient documentation
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
## 2013-04-27 ENCOUNTER — Ambulatory Visit (INDEPENDENT_AMBULATORY_CARE_PROVIDER_SITE_OTHER): Payer: BC Managed Care – PPO | Admitting: Psychiatry

## 2013-04-27 VITALS — BP 122/75 | HR 120 | Ht 67.0 in | Wt 176.8 lb

## 2013-04-27 DIAGNOSIS — F4322 Adjustment disorder with anxiety: Secondary | ICD-10-CM | POA: Diagnosis not present

## 2013-04-27 DIAGNOSIS — F4323 Adjustment disorder with mixed anxiety and depressed mood: Secondary | ICD-10-CM | POA: Insufficient documentation

## 2013-04-27 MED ORDER — TEMAZEPAM 15 MG PO CAPS: ORAL_CAPSULE | ORAL | Status: DC

## 2013-04-27 MED ORDER — TEMAZEPAM 15 MG PO CAPS: 15.0000 mg | ORAL_CAPSULE | Freq: Every evening | ORAL | Status: DC | PRN

## 2013-04-27 MED ORDER — LORAZEPAM 0.5 MG PO TABS: ORAL_TABLET | ORAL | Status: DC

## 2013-04-27 NOTE — Progress Notes (Signed)
[facility] MD Progress Note  04/27/2013 1:59 PM [name]  MRN:  [id number] Subjective:  Can't sleep Today the patient starts off by telling me that she is experiencing a lot of grieving. She describes a release for people who've died recently. Noted most of them are elderly persons with chronic illnesses that do not live in the city. Noted also is that her dog died about a year ago. The patient was asked directly she feels persistently depressed and she denies this. She does not have depression on a daily basis. She is sleeping poorly with difficulty falling asleep and waking up through the night. I think the patient has particularly poor sleep hygiene. She keeps herself next door and will go on when she wakes up. The patient is eating fairly well although she lost 10 pounds from having a GI flu. The patient says his problems concentrating but that's been long-standing. A close evaluation there is little evidence that she has childhood ADHD. Her diagnosis was made when she was in high school. The patient still able to enjoy her dog her 3 children and her husband. The patient is not suicidal. The patient takes multiple medications and many of them are psychotropics. She'll take Zyprexa O. on when necessary basis take spell lauded regularly and takes low-dose imipramine all for headaches. The patient has chronic back pain but seems very comfortable with me today. At this time I do not think there is any evidence of attention deficit disorder as I do not believe she's had this condition in childhood. The patient's major complaint seems to be that of anxiety. She takes 0.5 mg of Ativan in the morning and point 5 at night and takes 1 mg of Xanax. At this time I think we she'll clean up some of her current psychotropic medicines. Diagnosis:   DSM5: Schizophrenia Disorders:   Obsessive-Compulsive Disorders:   Trauma-Stressor Disorders:   Substance/Addictive Disorders:   Depressive Disorders:   Total Time spent  with patient:   Axis I: Adjustment Disorder with Anxiety  ADL's:  Intact  Sleep poor  Appetite:  Fair  Suicidal Ideation:  no Homicidal Ideation:  no AEB (as evidenced by):  Psychiatric Specialty Exam: Physical Exam  ROS  Blood pressure 122/75, pulse 120, height 5\' 7"  (1.702 m), weight 176 lb 12.8 oz (80.196 kg).Body mass index is 27.68 kg/(m^2).  General Appearance: Casual  Eye Contact::  Good  Speech:  Clear and Coherent  Volume:  Normal  Mood:  Euthymic  Affect:  Congruent  Thought Process:  Coherent  Orientation:  Full (Time, Place, and Person)  Thought Content:  WDL  Suicidal Thoughts:  No  Homicidal Thoughts:  No  Memory:  Negative  Judgemen  fair  Insight:  Lacking  Psychomotor Activity:  Normal  Concentration:  Fair  Recall:  [place] of Knowledge:Good  Language: Good  Akathisia:  No  Handed:  Right  AIMS (if indicated):     Assets:  Desire for Improvement  Sleep:      Musculoskeletal: Strength & Muscle Tone:  Gait & Station:  Patient leans:   Current Medications: Current Outpatient Prescriptions  Medication Sig Dispense Refill  . ALPRAZolam (XANAX) 1 MG tablet Take 1 tablet (1 mg total) by mouth at bedtime as needed for anxiety. Do not mix with narcotic medication or alcohol.  30 tablet  4  . CALCIUM-VITAMIN D PO Take 3 tablets by mouth daily.      . Cyanocobalamin (VITAMIN B-12) 2500 MCG SUBL  Place 2,500 mcg under the tongue daily.      Marland Kitchen dextroamphetamine (DEXTROSTAT) 5 MG tablet Take 10 mg by mouth 2 (two) times daily. 2 BID  Fill after 12/20 /2014      . HYDROmorphone (DILAUDID) 4 MG tablet Take by mouth every 4 (four) hours as needed for severe pain.      Marland Kitchen LORazepam (ATIVAN) 0.5 MG tablet 1 qam  3  qhs  120 tablet  3  . Multiple Vitamin (MULTIVITAMIN WITH MINERALS) TABS tablet Take 2 tablets by mouth daily.      Marland Kitchen OLANZapine (ZYPREXA) 2.5 MG tablet Take 2.5 mg by mouth 3 (three) times daily as needed (for hemiplegic migraines).      .  OnabotulinumtoxinA (BOTOX IJ) Inject 1 each as directed every 3 (three) months. For migraines      . promethazine (PHENERGAN) 25 MG suppository Place 1 suppository (25 mg total) rectally every 6 (six) hours as needed for nausea or vomiting.  12 each  0  . promethazine (PHENERGAN) 6.25 MG/5ML syrup Take 25 mg by mouth every 6 (six) hours as needed for nausea.      . temazepam (RESTORIL) 15 MG capsule 1 qhs  May repeat  60 capsule  5   No current facility-administered medications for this visit.    Lab Results: No results found for this or any previous visit (from the past 48 hour(s)).  Physical Findings: AIMS:  , ,  ,  ,    CIWA:    COWS:     Treatment Plan Summary: At this time the patient is seen by our therapist just a few times but is going to be referred to [facility] for counseling. I recommended that she asked to be seen by Richardo Priest. At this time I have no clear evidence of a significant affective disorder. I do not believe she is major depression. I think she has situational anxiety although it's not clear what significant stressors she actually has. I fear she's been over treated. At this time we will discontinue her 1 mg of Xanax at night. Her basic complaint is that of a sleep problem. It is noted that she has been diagnosed with a sleep disorder and in the past has been on the CPAP. She apparently lost so much weight she no longer believes that she needs to be on the CPAP machine. It is very evident to me that she is very poor sleep hygiene. At this time we'll go ahead and increase her Ativan a little bit taking point 5 in the morning and 1.5 mg at night and we will begin her on Restoril for sleep. The patient will begin a new therapy and return to see me in 2 and half months. It is noted that she's failed Ambien and trazodone in the past. I will not go any higher than the dose of 2 mg of Ativan a day. It is my hope that she learns new coping skills about how to deal with her anxiety. At  this time we she'll also discontinue her Dextrostat. The possibility this could be contributing to her poor sleep must be considered. Further given that I don't believe she has ADHD and this could be contributing to her anxiety as well. Her next visit we shall review sleep hygiene. I suggested to her that on her next visit we would also reconsider getting a sleep study particularly if the Ativan and Restoril did not work.. It should be noted the patient has had  multiple specialists and I believe probably has been over treated. Sleep is her predominant problem at this time.  Plan:  Medical Decision Making Problem Points:  Established problem, worsening (2) Data Points:  Review of medication regiment & side effects (2)  I certify that inpatient services furnished can reasonably be expected to improve the patient's condition.   Nemesis Rainwater, [place] 04/27/2013, 1:59 PM

## 2013-04-28 ENCOUNTER — Ambulatory Visit (HOSPITAL_COMMUNITY): Payer: Self-pay | Admitting: Psychiatry

## 2013-05-02 DIAGNOSIS — Z79899 Other long term (current) drug therapy: Secondary | ICD-10-CM | POA: Diagnosis not present

## 2013-05-02 DIAGNOSIS — M5137 Other intervertebral disc degeneration, lumbosacral region: Secondary | ICD-10-CM | POA: Diagnosis not present

## 2013-05-02 DIAGNOSIS — G894 Chronic pain syndrome: Secondary | ICD-10-CM | POA: Diagnosis not present

## 2013-05-02 DIAGNOSIS — M5380 Other specified dorsopathies, site unspecified: Secondary | ICD-10-CM | POA: Diagnosis not present

## 2013-05-03 ENCOUNTER — Ambulatory Visit (INDEPENDENT_AMBULATORY_CARE_PROVIDER_SITE_OTHER): Payer: BC Managed Care – PPO | Admitting: Licensed Clinical Social Worker

## 2013-05-03 DIAGNOSIS — F411 Generalized anxiety disorder: Secondary | ICD-10-CM

## 2013-05-03 NOTE — Progress Notes (Signed)
   THERAPIST PROGRESS NOTE  Session Time: 8:30am-9:20am  Participation Level: Active  Behavioral Response: Well GroomedDrowsy and LethargicAnxious  Type of Therapy: Individual Therapy  Treatment Goals addressed: Coping  Interventions: CBT, Strength-based, Supportive and Reframing  Summary: [name] is a 38 y.o. female who presents with anxious mood and affect. She is in pain today from a migraine and expresses frustration over this. She processes how she manages multiple complicated medical problems and how they impact her and her family. Explored her negative automatic thoughts. Patient has purchased CBT workbook and plans to use this with her next therapist.    Suicidal/Homicidal: Nowithout intent/plan  Therapist Response: Assessed patients current functioning and reviewed progress. Reviewed coping strategies. Assessed patients safety and assisted in identifying protective factors.  Reviewed crisis plan with patient. Assisted patient with the expression of anxiety. Reviewed patients self care plan. Assessed progress related to self care. Patients self care is good. Recommend daily exercise, increased socialization and recreation. Used CBT to assist patient with the identification of negative distortions and irrational thoughts. Encouraged patient to verbalize alternative and factual responses which challenge thought distortions.    Plan: Referred to [place] and has an appointment with Richardo Priest in two weeks. .  Diagnosis: Axis I: Generalized Anxiety Disorder    Axis II: No diagnosis    Angelica Steppe, LCSW 05/03/2013

## 2013-05-09 ENCOUNTER — Other Ambulatory Visit: Payer: Self-pay

## 2013-05-10 ENCOUNTER — Ambulatory Visit (HOSPITAL_COMMUNITY): Payer: Self-pay | Admitting: Licensed Clinical Social Worker

## 2013-05-10 ENCOUNTER — Other Ambulatory Visit: Payer: Self-pay

## 2013-05-12 ENCOUNTER — Ambulatory Visit: Payer: Self-pay | Admitting: Endocrinology

## 2013-05-15 ENCOUNTER — Ambulatory Visit (INDEPENDENT_AMBULATORY_CARE_PROVIDER_SITE_OTHER): Payer: BC Managed Care – PPO | Admitting: Licensed Clinical Social Worker

## 2013-05-15 DIAGNOSIS — F331 Major depressive disorder, recurrent, moderate: Secondary | ICD-10-CM | POA: Diagnosis not present

## 2013-05-15 DIAGNOSIS — F411 Generalized anxiety disorder: Secondary | ICD-10-CM

## 2013-05-16 ENCOUNTER — Emergency Department (HOSPITAL_COMMUNITY)
Admission: EM | Admit: 2013-05-16 | Discharge: 2013-05-16 | Disposition: A | Discharge status: Home / Self Care | Payer: BC Managed Care – PPO | Attending: Emergency Medicine | Admitting: Emergency Medicine

## 2013-05-16 ENCOUNTER — Encounter (HOSPITAL_COMMUNITY): Payer: Self-pay | Admitting: Emergency Medicine

## 2013-05-16 DIAGNOSIS — Z79899 Other long term (current) drug therapy: Secondary | ICD-10-CM | POA: Diagnosis not present

## 2013-05-16 DIAGNOSIS — Z880 Allergy status to penicillin: Secondary | ICD-10-CM | POA: Insufficient documentation

## 2013-05-16 DIAGNOSIS — R4789 Other speech disturbances: Secondary | ICD-10-CM | POA: Insufficient documentation

## 2013-05-16 DIAGNOSIS — F3289 Other specified depressive episodes: Secondary | ICD-10-CM | POA: Diagnosis not present

## 2013-05-16 DIAGNOSIS — E1149 Type 2 diabetes mellitus with other diabetic neurological complication: Secondary | ICD-10-CM | POA: Insufficient documentation

## 2013-05-16 DIAGNOSIS — Z8719 Personal history of other diseases of the digestive system: Secondary | ICD-10-CM | POA: Insufficient documentation

## 2013-05-16 DIAGNOSIS — Z8739 Personal history of other diseases of the musculoskeletal system and connective tissue: Secondary | ICD-10-CM | POA: Insufficient documentation

## 2013-05-16 DIAGNOSIS — G4730 Sleep apnea, unspecified: Secondary | ICD-10-CM | POA: Insufficient documentation

## 2013-05-16 DIAGNOSIS — G43909 Migraine, unspecified, not intractable, without status migrainosus: Secondary | ICD-10-CM | POA: Diagnosis not present

## 2013-05-16 DIAGNOSIS — F329 Major depressive disorder, single episode, unspecified: Secondary | ICD-10-CM | POA: Diagnosis not present

## 2013-05-16 DIAGNOSIS — G8929 Other chronic pain: Secondary | ICD-10-CM | POA: Diagnosis not present

## 2013-05-16 DIAGNOSIS — G8190 Hemiplegia, unspecified affecting unspecified side: Secondary | ICD-10-CM | POA: Insufficient documentation

## 2013-05-16 DIAGNOSIS — F411 Generalized anxiety disorder: Secondary | ICD-10-CM | POA: Diagnosis not present

## 2013-05-16 DIAGNOSIS — E1142 Type 2 diabetes mellitus with diabetic polyneuropathy: Secondary | ICD-10-CM | POA: Insufficient documentation

## 2013-05-16 DIAGNOSIS — R011 Cardiac murmur, unspecified: Secondary | ICD-10-CM | POA: Insufficient documentation

## 2013-05-16 MED ORDER — DIVALPROEX SODIUM 250 MG PO DR TAB
250.0000 mg | DELAYED_RELEASE_TABLET | Freq: Once | ORAL | Status: AC
Start: 2013-05-16 — End: 2013-05-16
  Administered 2013-05-16: 250 mg via ORAL
  Filled 2013-05-16: qty 1

## 2013-05-16 MED ORDER — SODIUM CHLORIDE 0.9 % IV BOLUS (SEPSIS): 1000.0000 mL | Freq: Once | INTRAVENOUS | Status: DC

## 2013-05-16 MED ORDER — VALPROATE SODIUM 500 MG/5ML IV SOLN
500.0000 mg | Freq: Once | INTRAVENOUS | Status: DC
  Filled 2013-05-16: qty 5

## 2013-05-16 MED ORDER — PROMETHAZINE HCL 25 MG PO TABS
25.0000 mg | ORAL_TABLET | ORAL | Status: AC
  Administered 2013-05-16: 25 mg via ORAL
  Filled 2013-05-16: qty 1

## 2013-05-16 MED ORDER — PROMETHAZINE HCL 25 MG/ML IJ SOLN
25.0000 mg | Freq: Once | INTRAMUSCULAR | Status: DC
  Filled 2013-05-16: qty 1

## 2013-05-16 NOTE — Progress Notes (Addendum)
   CARE MANAGEMENT ED NOTE 05/16/2013  Patient:  [name], [name]   Account Number:  [id number]  Date Initiated:  05/16/2013  Documentation initiated by:  Jackelyn Poling  Subjective/Objective Assessment:   38 yr old [place] [employer] with 5 ED visits & 2 admission in last 6 months at [facility] facilities. Came from home via EMS for c/o migraine since last night  hx of same hx of left side droop & weakness.  Normal for pt     Subjective/Objective Assessment Detail:   states that she has left sided paralysis, slurred speech, and can't walk.  Reports she is home bound She states that the pain is severe, but is normally made better by Dilaudid.  CM consulted to assist with home health services  Pt states she has used [facility] previously but they came to see her & then left. CM discussed that she could not elaborate on the issue & referred her to [facility] staff for an explanation  Pt voiced concerns about not receiving "Dilaudid" as she requested during this visit CM referred her to ED PA, her pain management MD and neurologist Reports her Pain management MD will not provide additional Dilaudid for breakthrough pain  Pt inquired if [facility] was available to see her records if she starts being seen by "[facility]" CM informed pt all medical records a online for various hospital systems & each provider has option of consulting other providers as needed  pcp listed as Environmental manager     Action/Plan:   CM spoke with ED PA and pt see notes below ED PA updated on pt's concerns voiced about dilaudid   Action/Plan Detail:   CM explained to pt that EDP providers treated crisis/emergency &prefers maintenance of chronic illness to be completed by pcp/specialists Discussed 5 ED visits & inquired if Cm could assist with community management servicesPt refusedassist   Anticipated DC Date:  05/16/2013     Status Recommendation to Physician:   Result of Recommendation:    Other ED Services  Consult Working [place]  Other  Outpatient Services - Pt will follow up   [place]   Choice offered to / List presented to:           [place]    Status of service:  Completed, signed off  ED Comments:   ED Comments Detail:  CM reviewed with pt that ED PA consulted Pt confirms she is not able to walk and has slurred speech CM for assist with home health services for HHPT/OT/aide/SP. CM reviewed with pt in details home health [facility]) (length of stay in home, types of [facility] staff available, coverage, primary caregiver, up to 24 hrs before services may be started). CM provided pt with a list of [place]. Pt choice of agency is [place]  ED Cm left a voice message for Stacy Gardner coordinator at [phone] 05/16/13  Faxed referral to [facility] at [phone] Received fax confirmation at [phone] 05/16/13  05/16/13 1528 ED CM received a return call from [place] to state pt can not be accepted as a client due to at capacity for home health patient load during inclement weather   1640 CM spoke with Cyril Mourning of [facility] about this referral Pending

## 2013-05-16 NOTE — Discharge Instructions (Signed)
Migraine Headache A migraine headache is an intense, throbbing pain on one or both sides of your head. A migraine can last for 30 minutes to several hours. CAUSES  The exact cause of a migraine headache is not always known. However, a migraine may be caused when nerves in the brain become irritated and release chemicals that cause inflammation. This causes pain. Certain things may also trigger migraines, such as:  Alcohol.  Smoking.  Stress.  Menstruation.  Aged cheeses.  Foods or drinks that contain nitrates, glutamate, aspartame, or tyramine.  Lack of sleep.  Chocolate.  Caffeine.  Hunger.  Physical exertion.  Fatigue.  Medicines used to treat chest pain (nitroglycerine), birth control pills, estrogen, and some blood pressure medicines. SIGNS AND SYMPTOMS  Pain on one or both sides of your head.  Pulsating or throbbing pain.  Severe pain that prevents daily activities.  Pain that is aggravated by any physical activity.  Nausea, vomiting, or both.  Dizziness.  Pain with exposure to bright lights, loud noises, or activity.  General sensitivity to bright lights, loud noises, or smells. Before you get a migraine, you may get warning signs that a migraine is coming (aura). An aura may include:  Seeing flashing lights.  Seeing bright spots, halos, or zig-zag lines.  Having tunnel vision or blurred vision.  Having feelings of numbness or tingling.  Having trouble talking.  Having muscle weakness. DIAGNOSIS  A migraine headache is often diagnosed based on:  Symptoms.  Physical exam.  A CT scan or MRI of your head. These imaging tests cannot diagnose migraines, but they can help rule out other causes of headaches. TREATMENT Medicines may be given for pain and nausea. Medicines can also be given to help prevent recurrent migraines.  HOME CARE INSTRUCTIONS  Only take over-the-counter or prescription medicines for pain or discomfort as directed by your  health care provider. The use of long-term narcotics is not recommended.  Lie down in a dark, quiet room when you have a migraine.  Keep a journal to find out what may trigger your migraine headaches. For example, write down:  What you eat and drink.  How much sleep you get.  Any change to your diet or medicines.  Limit alcohol consumption.  Quit smoking if you smoke.  Get 7 9 hours of sleep, or as recommended by your health care provider.  Limit stress.  Keep lights dim if bright lights bother you and make your migraines worse. SEEK IMMEDIATE MEDICAL CARE IF:   Your migraine becomes severe.  You have a fever.  You have a stiff neck.  You have vision loss.  You have muscular weakness or loss of muscle control.  You start losing your balance or have trouble walking.  You feel faint or pass out.  You have severe symptoms that are different from your first symptoms. MAKE SURE YOU:   Understand these instructions.  Will watch your condition.  Will get help right away if you are not doing well or get worse. Document Released: 03/09/2005 Document Revised: 12/28/2012 Document Reviewed: 11/14/2012 [employer] Patient Information 2014 [employer].  

## 2013-05-16 NOTE — ED Notes (Signed)
Pt is waiting for [facility]. Transport called as directed by PA

## 2013-05-16 NOTE — ED Provider Notes (Signed)
CSN: [id number]     Arrival date & time 05/16/13  1012 History   First MD Initiated Contact with Patient 05/16/13 1017     Chief Complaint  Patient presents with  . Migraine     (Consider location/radiation/quality/duration/timing/severity/associated sxs/prior Treatment) HPI Comments: Patient presents to the ED with a chief complaint of migraine.  She states that the symptoms started last night.  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  She states that this is similar, but worse.  She states that she normally has to be admitted for these symptoms.  She states that she has left sided paralysis, slurred speech, and can't walk.  She states that the pain is severe, but is normally made better by dilaudid.  The history is provided by the patient. No language interpreter was used.    Past Medical History  Diagnosis Date  . Depression   . Anxiety   . Diabetes mellitus   . GERD (gastroesophageal reflux disease)   . ADD (attention deficit disorder)   . Hyperlipemia   . Herniated disc   . Allergy     Seasonal  . Diabetic neuropathy   . Shortness of breath     with exertion   . Sleep apnea     obsructive settings at 13   . Heart murmur     asa child  . Seizures     hx of / month ago - dehydration   . 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 Degenerative disc disease   . Annular disc tear   . Retrolisthesis     L4 and L5  . Bulging disc   . Chronic pain 04/20/13    Medication prescribed by Vira Blanco, M.D.   Past Surgical History  Procedure Laterality Date  . Cholecystectomy    . Cesarean section    . Tonsillectomy    . Laporoscopy  2007  . Breath tek h pylori  09/11/2011    Procedure: BREATH TEK H PYLORI;  Surgeon: Edward Jolly, MD;  Location: [facility];  Service: General;  Laterality: N/A;  . 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 Bowel resection  11/30/2011    Procedure: SMALL BOWEL RESECTION;  Surgeon: Edward Jolly, MD;  Location: [facility];  Service: General;;  . Esophagogastroduodenoscopy  03/14/2012    Procedure: ESOPHAGOGASTRODUODENOSCOPY (EGD);  Surgeon: Shann Medal, MD;  Location: [facility];  Service: General;  Laterality: N/A;  . Esophagogastroduodenoscopy  04/25/2012    Procedure: ESOPHAGOGASTRODUODENOSCOPY (EGD);  Surgeon: Shann Medal, MD;  Location: [facility];  Service: General;  Laterality: N/A;  . Esophagogastroduodenoscopy N/A 03/31/2013    Procedure: ESOPHAGOGASTRODUODENOSCOPY (EGD);  Surgeon: Shann Medal, MD;  Location: [facility];  Service: General;  Laterality: N/A;   Family History  Problem Relation Age of Onset  . Diabetes type II    . Diabetes Mother   . Hypertension Mother   . Depression Mother   . OCD Mother   . Depression Brother   . Cancer Maternal Aunt     ovarian   . Depression Maternal Aunt   . Diabetes Maternal Aunt   . Cancer Maternal Grandmother     Lung  . COPD Maternal Grandmother   . Cancer Maternal Grandfather  Prostate  . Diabetes Maternal Grandfather   . Hearing loss Maternal Grandfather   . Hypertension Maternal Grandfather   . Stroke Maternal Grandfather   . Cancer Paternal Grandmother     Colon   History  Substance Use Topics  . Smoking status: Never Smoker   . Smokeless tobacco: Never Used  . Alcohol Use: No   OB History   Grav Para Term Preterm Abortions TAB SAB Ect Mult Living                 Review of Systems  All other systems reviewed and are negative.      Allergies  Ace inhibitors; Atarax; Cephalosporins; Codeine; Compazine; Decadron; Demerol; Metoclopramide hcl; Morphine and related; Other; Penicillins; Pork-derived products; Prozac; Shellfish-derived products; Sulfa antibiotics; Triptans; Zanaflex; Zofran; Dihydroergotamine; and Periactin  Home Medications    Current Outpatient Rx  Name  Route  Sig  Dispense  Refill  . CALCIUM-VITAMIN D PO   Oral   Take 3 tablets by mouth daily.         . Cyanocobalamin (VITAMIN B-12) 2500 MCG SUBL   Sublingual   Place 2,500 mcg under the tongue daily.         Marland Kitchen HYDROmorphone (DILAUDID) 4 MG tablet   Oral   Take by mouth every 4 (four) hours as needed for severe pain.         Marland Kitchen LORazepam (ATIVAN) 0.5 MG tablet   Oral   Take 0.5 mg by mouth 2 (two) times daily.         . Multiple Vitamin (MULTIVITAMIN WITH MINERALS) TABS tablet   Oral   Take 2 tablets by mouth daily.         Marland Kitchen OLANZapine (ZYPREXA) 2.5 MG tablet   Oral   Take 2.5 mg by mouth 3 (three) times daily as needed (for hemiplegic migraines).         . OnabotulinumtoxinA (BOTOX IJ)   Injection   Inject 1 each as directed every 3 (three) months. For migraines         . promethazine (PHENERGAN) 25 MG suppository   Rectal   Place 1 suppository (25 mg total) rectally every 6 (six) hours as needed for nausea or vomiting.   12 each   0   . temazepam (RESTORIL) 15 MG capsule   Oral   Take 15-30 mg by mouth at bedtime as needed for sleep.          BP 109/67  Pulse 94  Temp(Src) 98.3 F (36.8 C) (Oral)  Resp 20  SpO2 100% Physical Exam  Nursing note and vitals reviewed. Constitutional: She is oriented to person, place, and time. She appears well-developed and well-nourished.  Slurred speech  HENT:  Head: Normocephalic and atraumatic.  Right Ear: External ear normal.  Left Ear: External ear normal.  Eyes: Conjunctivae and EOM are normal. Pupils are equal, round, and reactive to light.  Neck: Normal range of motion. Neck supple.  No pain with neck flexion, no meningismus  Cardiovascular: Normal rate, regular rhythm and normal heart sounds.  Exam reveals no gallop and no friction rub.   No murmur heard. Pulmonary/Chest: Effort normal and breath sounds normal. No respiratory distress. She has no wheezes. She has no  rales. She exhibits no tenderness.  Abdominal: Soft. She exhibits no distension and no mass. There is no tenderness. There is no rebound and no guarding.  Musculoskeletal: Normal range of motion. She exhibits no edema and no tenderness.  Unable  to ambulate, does not lift left arm or leg, but left hamstring muscle group fires when right leg is extended, left arm falls slowly with resistance when dropped  Neurological: She is alert and oriented to person, place, and time. She has normal reflexes.  CN 3-12 intact, normal finger to nose, no pronator drift, sensation and strength intact bilaterally.  Skin: Skin is warm and dry.  Psychiatric: She has a normal mood and affect. Her behavior is normal. Judgment and thought content normal.    ED Course  Procedures (including critical care time) Labs Review Labs Reviewed  CBC WITH DIFFERENTIAL  BASIC METABOLIC PANEL   Imaging Review No results found.  EKG Interpretation   None       MDM   Final diagnoses:  Migraine  Chronic pain   Patient with hemiplegic migraines.  I discussed the patient with Dr. Alvino Chapel, who recommends against narcotics.  Physical exam findings contradict true hemiplegia.  Could be conversion disorder.  Will check labs, give fluids and depakote.    12:32 PM Patient discussed with Dr. Alvino Chapel. Patient is requesting to see the emergency department director. Fortunately, Dr. Ashok Cordia was in the house, and came to see the patient. After a long conversation, we have decided that the most appropriate care, is for the patient to be discharged to home. She has had multiple workups for this same problem, including multiple neurology consultations and MRIs. She is requesting Dilaudid, however I do not feel this will improve the patient's condition. As there is no other medical intervention that I can provide in the emergency department or in the hospital that will be curative, we will discharge the patient to home with close  followup. We have also provided physical therapy and occupational therapy home health aides for the patient. She understands the plan, but is disappointed, and feels the service is lacking. She refuses to ambulate, and requests transportation home.  Montine Circle, PA-C 05/16/13 1236

## 2013-05-16 NOTE — Progress Notes (Deleted)
   CARE MANAGEMENT ED NOTE 05/16/2013  Patient:  ABISHAI, POSS   Account Number:  [id number]  Date Initiated:  05/16/2013  Documentation initiated by:  Jackelyn Poling  Subjective/Objective Assessment:   38 yr old self pay pt no pcp listed 4 [facility] ED visits in last 6 months Seen last by an ED CM on 04/24/13 & provided MATCH assistance     Subjective/Objective Assessment Detail:   presents via [facility] from home with a chief complaint of generalized body cramping since yesterday. Patient reports a history of hypokalemia and has been hospitalized for the same. Patient is not taking any potassium supplementation at this time. Patient reports pain is worse upon movement.  Female at bedside stated it was "unlawful" for pt to be in pain & not be able get assist with "narcotics"  Cm explained to pt that [facility] does not provide medication assistance for narcotics Pt also confirmed she had attempted to get assistance from [facility] for narcotics & was told that [facility] does not assist with narcotics Pt confirms [facility] assists her with all her other medications  Pt offered a list of local churches but she refused & states she has some places to go to for financial assistance     Action/Plan:   CM spoke with pt and review resources for [place] provided pt with a community assistance program Rx drug discount card  see notes below   Action/Plan Detail:   CM discussed with pt that the [facility] letter is for a one time assistance and does not assist her with her medications for "the whole year"  as pt informed CM she wanted.  CM encouraged pt to use resources provided by CM   Anticipated DC Date:  05/16/2013     Status Recommendation to Physician:   Result of Recommendation:    Other ED Services  Consult Working [place]  Other  CM consult  Outpatient Services - Pt will follow up  PCP issues  Patient refused services    Choice offered to / List presented to:            Status of  service:  Completed, signed off  ED Comments:  CM re reviewed with the pt about [facility] MATCH program (annual assistance, $3 co pay for each Rx through [facility] program, does not include refills, 7 day expiration of [place] letter and choice of pharmacies)  ED Comments Detail:  CM discussed and provided written information for self pay pcps, importance of pcp for f/u care, www.needymeds.org, discounted pharmacies and other [employer] such as financial assistance, DSS and  health department Reviewed resources for [employer] self pay pcps like [employer], family medicine at [address], [facility] family practice, general medical clinics, [facility] urgent care plus others, [facility] out patient pharmacies and housing Pt voiced understanding and appreciation of resources provided  Provided [facility] contact information

## 2013-05-16 NOTE — ED Notes (Signed)
Per EMS, pt from home.  Pt c/o migraine since last night. Pt has hx of same.  Pt has hx of left side droop and weakness.  Normal for patient.  Vitals:  118/86, hr 92, 16 resp 98% ra

## 2013-05-17 ENCOUNTER — Ambulatory Visit (HOSPITAL_COMMUNITY): Payer: Self-pay | Admitting: Licensed Clinical Social Worker

## 2013-05-17 NOTE — ED Provider Notes (Signed)
Medical screening examination/treatment/procedure(s) were performed by non-physician practitioner and as supervising physician I was immediately available for consultation/collaboration.  EKG Interpretation   None        Jasper Riling. Alvino Chapel, MD 05/17/13 [phone]

## 2013-05-22 ENCOUNTER — Ambulatory Visit (INDEPENDENT_AMBULATORY_CARE_PROVIDER_SITE_OTHER): Payer: BC Managed Care – PPO | Admitting: Licensed Clinical Social Worker

## 2013-05-22 ENCOUNTER — Ambulatory Visit: Payer: BC Managed Care – PPO | Admitting: Licensed Clinical Social Worker

## 2013-05-22 DIAGNOSIS — F331 Major depressive disorder, recurrent, moderate: Secondary | ICD-10-CM

## 2013-05-22 DIAGNOSIS — F411 Generalized anxiety disorder: Secondary | ICD-10-CM

## 2013-05-24 ENCOUNTER — Other Ambulatory Visit (INDEPENDENT_AMBULATORY_CARE_PROVIDER_SITE_OTHER): Payer: BC Managed Care – PPO

## 2013-05-24 ENCOUNTER — Ambulatory Visit (HOSPITAL_COMMUNITY): Payer: Self-pay | Admitting: Licensed Clinical Social Worker

## 2013-05-24 DIAGNOSIS — E119 Type 2 diabetes mellitus without complications: Secondary | ICD-10-CM | POA: Diagnosis not present

## 2013-05-24 DIAGNOSIS — E785 Hyperlipidemia, unspecified: Secondary | ICD-10-CM

## 2013-05-24 LAB — COMPREHENSIVE METABOLIC PANEL
ALT: 24 U/L (ref 0–35)
AST: 21 U/L (ref 0–37)
Albumin: 4.5 g/dL (ref 3.5–5.2)
Alkaline Phosphatase: 78 U/L (ref 39–117)
BILIRUBIN TOTAL: 0.8 mg/dL (ref 0.3–1.2)
BUN: 13 mg/dL (ref 6–23)
CO2: 27 mEq/L (ref 19–32)
CREATININE: 0.8 mg/dL (ref 0.4–1.2)
Calcium: 9.5 mg/dL (ref 8.4–10.5)
Chloride: 103 mEq/L (ref 96–112)
GFR: 91.02 mL/min (ref >60.00)
Glucose, Bld: 85 mg/dL (ref 70–99)
Potassium: 3.9 mEq/L (ref 3.5–5.1)
Sodium: 139 mEq/L (ref 135–145)
Total Protein: 7.6 g/dL (ref 6.0–8.3)

## 2013-05-24 LAB — LIPID PANEL
CHOL/HDL RATIO: 5
Cholesterol: 234 mg/dL — ABNORMAL HIGH (ref 0–200)
HDL: 48.1 mg/dL (ref >39.00)
LDL Cholesterol: 159 mg/dL — ABNORMAL HIGH (ref 0–99)
Triglycerides: 136 mg/dL (ref 0.0–149.0)
VLDL: 27.2 mg/dL (ref 0.0–40.0)

## 2013-05-24 LAB — HEMOGLOBIN A1C: HEMOGLOBIN A1C: 6 % (ref 4.6–6.5)

## 2013-05-26 DIAGNOSIS — F411 Generalized anxiety disorder: Secondary | ICD-10-CM | POA: Diagnosis not present

## 2013-05-26 DIAGNOSIS — F39 Unspecified mood [affective] disorder: Secondary | ICD-10-CM | POA: Diagnosis not present

## 2013-05-26 DIAGNOSIS — F4542 Pain disorder with related psychological factors: Secondary | ICD-10-CM | POA: Diagnosis not present

## 2013-05-29 ENCOUNTER — Ambulatory Visit (INDEPENDENT_AMBULATORY_CARE_PROVIDER_SITE_OTHER): Payer: BC Managed Care – PPO | Admitting: Endocrinology

## 2013-05-29 ENCOUNTER — Other Ambulatory Visit: Payer: Self-pay | Admitting: *Deleted

## 2013-05-29 ENCOUNTER — Ambulatory Visit (INDEPENDENT_AMBULATORY_CARE_PROVIDER_SITE_OTHER): Payer: BC Managed Care – PPO | Admitting: Licensed Clinical Social Worker

## 2013-05-29 ENCOUNTER — Encounter: Payer: Self-pay | Admitting: Endocrinology

## 2013-05-29 VITALS — BP 122/72 | HR 75 | Temp 97.6°F | Resp 16 | Ht 67.0 in | Wt 176.8 lb

## 2013-05-29 DIAGNOSIS — F411 Generalized anxiety disorder: Secondary | ICD-10-CM | POA: Diagnosis not present

## 2013-05-29 DIAGNOSIS — E119 Type 2 diabetes mellitus without complications: Secondary | ICD-10-CM

## 2013-05-29 DIAGNOSIS — F331 Major depressive disorder, recurrent, moderate: Secondary | ICD-10-CM

## 2013-05-29 DIAGNOSIS — E7800 Pure hypercholesterolemia, unspecified: Secondary | ICD-10-CM

## 2013-05-29 MED ORDER — ONETOUCH DELICA LANCETS FINE MISC
Status: DC
Start: 2013-05-29 — End: 2013-07-20

## 2013-05-29 MED ORDER — PRAVASTATIN SODIUM 40 MG PO TABS: 40.0000 mg | ORAL_TABLET | Freq: Every day | ORAL | Status: DC

## 2013-05-29 MED ORDER — GLUCOSE BLOOD VI STRP: ORAL_STRIP | Status: DC

## 2013-05-29 NOTE — Progress Notes (Signed)
Patient ID: [name], female   DOB: 1975-05-05, 38 y.o.   MRN: [id number]   Reason for Appointment:  follow-up of various problems   History of Present Illness   DIABETES: Previous history: She had insulin requiring diabetes prior to her gastric bypass surgery without adequate control Her preoperative weight was 288 pounds Since her gastric bypass surgery she has been off all medications and has had fairly good control  Recent history: She is using a [employer] brand monitor and is getting a couple of high readings recently. However lab glucose and A1c are excellent She continues to lose weight However has not been able to exercise recently Also today she will be getting more carbohydrates recently in her diet Currently not on any medications       Monitors blood glucose:  fasting 130, 233  nonfasting after breakfast  Meals:  4-6 small meals per day. More carbs recently         Physical activity: exercise:  Walks off and on              Wt Readings from Last 3 Encounters:  05/29/13 176 lb 12.8 oz (80.196 kg)  04/27/13 176 lb 12.8 oz (80.196 kg)  04/06/13 183 lb 9.6 oz (83.28 kg)    LABS:  Lab Results  Component Value Date   HGBA1C 6.0 05/24/2013   HGBA1C 6.1 01/03/2013   HGBA1C 6.3* 03/04/2012   Lab Results  Component Value Date   MICROALBUR 0.4 01/03/2013   LDLCALC 159* 05/24/2013   CREATININE 0.8 05/24/2013     Appointment on 05/24/2013  Component Date Value Ref Range Status  . Hemoglobin A1C 05/24/2013 6.0  4.6 - 6.5 % Final   Glycemic Control Guidelines for People with Diabetes:Non Diabetic:  <6%Goal of Therapy: <7%Additional Action Suggested:  >8%   . Sodium 05/24/2013 139  135 - 145 mEq/L Final  . Potassium 05/24/2013 3.9  3.5 - 5.1 mEq/L Final  . Chloride 05/24/2013 103  96 - 112 mEq/L Final  . CO2 05/24/2013 27  19 - 32 mEq/L Final  . Glucose, Bld 05/24/2013 85  70 - 99 mg/dL Final  . BUN 05/24/2013 13  6 - 23 mg/dL Final  . Creatinine, Ser 05/24/2013 0.8  0.4 -  1.2 mg/dL Final  . Total Bilirubin 05/24/2013 0.8  0.3 - 1.2 mg/dL Final  . Alkaline Phosphatase 05/24/2013 78  39 - 117 U/L Final  . AST 05/24/2013 21  0 - 37 U/L Final  . ALT 05/24/2013 24  0 - 35 U/L Final  . Total Protein 05/24/2013 7.6  6.0 - 8.3 g/dL Final  . Albumin 05/24/2013 4.5  3.5 - 5.2 g/dL Final  . Calcium 05/24/2013 9.5  8.4 - 10.5 mg/dL Final  . GFR 05/24/2013 91.02  >60.00 mL/min Final  . Cholesterol 05/24/2013 234* 0 - 200 mg/dL Final   ATP III Classification       Desirable:  < 200 mg/dL               Borderline High:  200 - 239 mg/dL          High:  > = 240 mg/dL  . Triglycerides 05/24/2013 136.0  0.0 - 149.0 mg/dL Final   Normal:  <150 mg/dLBorderline High:  150 - 199 mg/dL  . HDL 05/24/2013 48.10  >39.00 mg/dL Final  . VLDL 05/24/2013 27.2  0.0 - 40.0 mg/dL Final  . LDL Cholesterol 05/24/2013 159* 0 - 99 mg/dL Final  . Total  CHOL/HDL Ratio 05/24/2013 5   Final                  Men          Women1/2 Average Risk     3.4          3.3Average Risk          5.0          4.42X Average Risk          9.6          7.13X Average Risk          15.0          11.0                          Medication List       This list is accurate as of: 05/29/13  1:27 PM.  Always use your most recent med list.               BOTOX IJ  Inject 1 each as directed every 3 (three) months. For migraines     CALCIUM-VITAMIN D PO  Take 3 tablets by mouth daily.     EPINEPHrine 0.3 mg/0.3 mL Soaj injection  Commonly known as:  EPI-PEN  Inject 0.3 mg into the muscle once.     HYDROmorphone 4 MG tablet  Commonly known as:  DILAUDID  Take by mouth every 4 (four) hours as needed for severe pain.     imipramine 10 MG tablet  Commonly known as:  TOFRANIL  Take 30 mg by mouth at bedtime.     isometheptene-acetaminophen-dichloralphenazone 65-325-100 MG capsule  Commonly known as:  MIDRIN  Take 1 capsule by mouth 4 (four) times daily as needed for migraine. Maximum 5 capsules in 12 hours for  migraine headaches, 8 capsules in 24 hours for tension headaches.     LORazepam 0.5 MG tablet  Commonly known as:  ATIVAN  Take 0.5 mg by mouth 2 (two) times daily.     multivitamin with minerals Tabs tablet  Take 2 tablets by mouth daily.     OLANZapine 2.5 MG tablet  Commonly known as:  ZYPREXA  Take 2.5 mg by mouth 3 (three) times daily as needed (for hemiplegic migraines).     promethazine 25 MG suppository  Commonly known as:  PHENERGAN  Place 1 suppository (25 mg total) rectally every 6 (six) hours as needed for nausea or vomiting.     temazepam 15 MG capsule  Commonly known as:  RESTORIL  Take 15-30 mg by mouth at bedtime as needed for sleep.     tiZANidine 4 MG tablet  Commonly known as:  ZANAFLEX  Take 4 mg by mouth daily as needed (for migraines).     Vitamin B-12 2500 MCG Subl  Place 2,500 mcg under the tongue daily.        Allergies:  Allergies  Allergen Reactions  . Ace Inhibitors Other (See Comments)    Spaces out and triggers migraines  . Atarax [Hydroxyzine Hcl] Anaphylaxis  . Cephalosporins Anaphylaxis  . Codeine Anaphylaxis    Tolerates Dilaudid and Percocet.  . Compazine Anaphylaxis    Tolerates Phenergan.  . Decadron [Dexamethasone] Anaphylaxis  . Demerol Anaphylaxis  . Metoclopramide Hcl Anaphylaxis  . Morphine And Related Anaphylaxis    Tolerates Dilaudid and Percocet.  . Other Other (See Comments)    ALL STEROIDS CAUSES ULCERS.   Marland Kitchen Penicillins  Anaphylaxis  . Pork-Derived Products Other (See Comments)    Religous reasons--per pt ok to take Heparin when medically necessary  . Prozac [Fluoxetine Hcl] Other (See Comments)    Pt reports severe negative mood altering after taking.   Marland Kitchen Shellfish-Derived Products Other (See Comments)    Religous reasons  . Sulfa Antibiotics Anaphylaxis  . Triptans Anaphylaxis    Makes migraines worse also  . Zanaflex [Tizanidine Hcl] Other (See Comments)    Loss of balance, lightheadedness, spaces out  .  Zofran Anaphylaxis  . Dihydroergotamine Other (See Comments)    unknown  . Periactin [Cyproheptadine] Rash    Past Medical History  Diagnosis Date  . Depression   . Anxiety   . Diabetes mellitus   . GERD (gastroesophageal reflux disease)   . ADD (attention deficit disorder)   . Hyperlipemia   . Herniated disc   . Allergy     Seasonal  . Diabetic neuropathy   . Shortness of breath     with exertion   . Sleep apnea     obsructive settings at 13   . Heart murmur     asa child  . Seizures     hx of / month ago - dehydration   . 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 Degenerative disc disease   . Annular disc tear   . Retrolisthesis     L4 and L5  . Bulging disc   . Chronic pain 04/20/13    Medication prescribed by Vira Blanco, M.D.    Past Surgical History  Procedure Laterality Date  . Cholecystectomy    . Cesarean section    . Tonsillectomy    . Laporoscopy  2007  . Breath tek h pylori  09/11/2011    Procedure: BREATH TEK H PYLORI;  Surgeon: Edward Jolly, MD;  Location: [facility];  Service: General;  Laterality: N/A;  . 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 Bowel resection  11/30/2011    Procedure: SMALL BOWEL RESECTION;  Surgeon: Edward Jolly, MD;  Location: [facility];  Service: General;;  . Esophagogastroduodenoscopy  03/14/2012    Procedure: ESOPHAGOGASTRODUODENOSCOPY (EGD);  Surgeon: Shann Medal, MD;  Location: [facility];  Service: General;  Laterality: N/A;  . Esophagogastroduodenoscopy  04/25/2012    Procedure: ESOPHAGOGASTRODUODENOSCOPY (EGD);  Surgeon: Shann Medal, MD;  Location: [facility];  Service: General;  Laterality: N/A;  . Esophagogastroduodenoscopy N/A 03/31/2013    Procedure: ESOPHAGOGASTRODUODENOSCOPY (EGD);  Surgeon: Shann Medal, MD;  Location: [facility];  Service: General;  Laterality: N/A;    Family History  Problem Relation Age of Onset  . Diabetes type II    . Diabetes Mother   . Hypertension Mother   . Depression Mother   . OCD Mother   . Depression Brother   . Cancer Maternal Aunt     ovarian   . Depression Maternal Aunt   . Diabetes Maternal Aunt   . Cancer Maternal Grandmother     Lung  . COPD Maternal Grandmother   . Cancer Maternal Grandfather     Prostate  . Diabetes Maternal Grandfather   . Hearing loss Maternal Grandfather   . Hypertension Maternal Grandfather   . Stroke Maternal Grandfather   . Cancer Paternal Grandmother  Colon    Social History:  reports that she has never smoked. She has never used smokeless tobacco. She reports that she does not drink alcohol or use illicit drugs.  Review of Systems:  Lipids: She has previously been treated for hyperlipidemia including high triglycerides, currently not on any medication. Previously took Lipitor For some reason her LDL is much higher than before. She has been reluctant to start medications  Lab Results  Component Value Date   CHOL 234* 05/24/2013   HDL 48.10 05/24/2013   LDLCALC 159* 05/24/2013   TRIG 136.0 05/24/2013   CHOLHDL 5 05/24/2013     She complains of feeling tired.  No previous history of thyroid disease or anemia       Examination:   BP 122/72  Pulse 75  Temp(Src) 97.6 F (36.4 C)  Resp 16  Ht _0  (1.702 m)  Wt 176 lb 12.8 oz (80.196 kg)  BMI 27.68 kg/m2  SpO2 96%  Body mass index is 27.68 kg/(m^2).    ASSESSMENT/ PLAN::   Diabetes type 2   Blood glucose control is good with her A1c 6% and normal lab glucose Not clear if her home monitor is accurate and will need to be using the brand name monitor, discussed how to use the Verio monitor today   She will start monitoring her blood sugar at various times including after meals  May consider using Januvia or metformin if her glucose is consistently high She will also  try to modify her diet with less carbohydrate and consistent amount of protein at each meal   HYPERCHOLESTEROLEMIA:   Her LDL is significantly high and she will be started on pravastatin, discussed benefits The pravastatin would be preferred to atorvastatin because of possible higher incidence of diabetes with atorvastatin  Raneshia Derick 05/29/2013, 1:27 PM

## 2013-05-29 NOTE — Patient Instructions (Signed)
Low saturated fat diet

## 2013-05-31 ENCOUNTER — Ambulatory Visit (HOSPITAL_COMMUNITY): Payer: Self-pay | Admitting: Licensed Clinical Social Worker

## 2013-06-01 DIAGNOSIS — M5137 Other intervertebral disc degeneration, lumbosacral region: Secondary | ICD-10-CM | POA: Diagnosis not present

## 2013-06-01 DIAGNOSIS — G894 Chronic pain syndrome: Secondary | ICD-10-CM | POA: Diagnosis not present

## 2013-06-01 DIAGNOSIS — M79609 Pain in unspecified limb: Secondary | ICD-10-CM | POA: Diagnosis not present

## 2013-06-01 DIAGNOSIS — IMO0002 Reserved for concepts with insufficient information to code with codable children: Secondary | ICD-10-CM | POA: Diagnosis not present

## 2013-06-05 ENCOUNTER — Telehealth: Payer: Self-pay | Admitting: *Deleted

## 2013-06-05 ENCOUNTER — Ambulatory Visit: Payer: BC Managed Care – PPO | Admitting: Licensed Clinical Social Worker

## 2013-06-05 NOTE — Telephone Encounter (Signed)
Pt called stating that she has had leg pain since she began pravastatin. Asked pt if she could take OTC pain relievers. Pt stated no due to her gastric bypass surgery. Advised pt to eat a banana, she may just be low in potassium. Advised pt that statins due cause joint pain and that I would send a message to Dr Dwyane Dee for advice. Told pt we would call her and let her know what Dr Dwyane Dee says. Pt understood.

## 2013-06-05 NOTE — Telephone Encounter (Signed)
She had previously been on Lipitor without any side effects. She can try this again if she wants. If she has a side effect he will be pain all over and not in one leg

## 2013-06-07 ENCOUNTER — Ambulatory Visit (HOSPITAL_COMMUNITY): Payer: Self-pay | Admitting: Licensed Clinical Social Worker

## 2013-06-14 ENCOUNTER — Ambulatory Visit (HOSPITAL_COMMUNITY): Payer: Self-pay | Admitting: Licensed Clinical Social Worker

## 2013-06-14 ENCOUNTER — Ambulatory Visit (INDEPENDENT_AMBULATORY_CARE_PROVIDER_SITE_OTHER): Payer: BC Managed Care – PPO | Admitting: Psychiatry

## 2013-06-14 VITALS — BP 119/81 | HR 117 | Ht 67.0 in | Wt 177.6 lb

## 2013-06-14 DIAGNOSIS — F4323 Adjustment disorder with mixed anxiety and depressed mood: Secondary | ICD-10-CM

## 2013-06-14 MED ORDER — LORAZEPAM 0.5 MG PO TABS: ORAL_TABLET | ORAL | Status: DC

## 2013-06-14 MED ORDER — TRAZODONE HCL 100 MG PO TABS: ORAL_TABLET | ORAL | Status: DC

## 2013-06-14 NOTE — Progress Notes (Signed)
[facility] MD Progress Note  06/14/2013 1:43 PM [name]  MRN:  [id number] Subjective:  Going  On a trip At this time the patient seems to be at her baseline. She describes some reactive depression. On the other hand she [place] She's here because she wants her medication early. She made an earlier appointment just to get her medicines. At this time she has moderate anxiety related to her trip. Her biggest complaint again is poor sleep. It is noted that she no longer is on CPAP. At this time she is eating well. She has good energy and can concentrate without problems. Once again I'm clear with her that I will not be prescribing Dextrostat or any stimulant. I do not believe she has ADHD her childhood and therefore does not have as an adult. We reviewed her medicines it was evident that her neurologist giving her low dose of Zyprexa. I shared with her stomach issues with Zyprexa. She'll take this up with her neurologist. The patient also takes a dose of imipramine as well sinus and she's on an antidepressant and antipsychotic medicine and I will not add anything to this cocktail psychotropic agents. The patient actually is not all that resistant to the fact that I refused any new medications. Again I will stick to the idea that she'll get no more than 2 mg of Ativan for situational anxiety. The good news is she's begun in therapy with Richardo Priest and seems to connect or well. I think this might be the most important intervention in this individual treatment. The patient claims a Restoril has not worked and I will go ahead and change it to trazodone. Today we went over sleep hygiene and asked it seems pretty good. The possibility of getting a sleep study in the future just for issues around insomnia should possibly be considered. At this time the patient is very stable. She's not suicidal. We reviewed a very short episode where apparently she experienced visual  hallucinatory activity. This is very short lived and I believe is out of context for any significant psychiatric process. It apparently upset her enough when it occurred she called police. It has not recurred. We went ahead and slightly reduced her Ativan at that time and today given her anxious state we will increase it back to 0.5 mg 3 times a day. Diagnosis:   DSM5: Schizophrenia Disorders:   Obsessive-Compulsive Disorders:   Trauma-Stressor Disorders:   Substance/Addictive Disorders:   Depressive Disorders:   Total Time spent with patient: 30 minutes  Axis I: Adjustment Disorder with Mixed Disturbance of Emotions and Conduct  ADL's:  Intact  Sleep: Good  Appetite:  Good  Suicidal Ideation:  no Homicidal Ideation:  none AEB (as evidenced by):  Psychiatric Specialty Exam: Physical Exam  ROS  Blood pressure 119/81, pulse 117, height 5\' 7"  (1.702 m), weight 177 lb 9.6 oz (80.559 kg).Body mass index is 27.81 kg/(m^2).  General Appearance: Casual  Eye Contact::  Good  Speech:  Clear and Coherent  Volume:  Normal  Mood:  Negative  Affect:  NA  Thought Process:  Coherent  Orientation:  Full (Time, Place, and Person)  Thought Content:  WDL  Suicidal Thoughts:  No  Homicidal Thoughts:  No  Memory:  NA  Judgement:  Good  Insight:  Good  Psychomotor Activity:  Normal  Concentration:  Good  Recall:  Good  Fund of Knowledge:Good  Language: Good  Akathisia:  No  Handed:  Right  AIMS (if indicated):     Assets:  Communication Skills  Sleep:      Musculoskeletal: Strength & Muscle Tone:  Gait & Station:  Patient leans:   Current Medications: Current Outpatient Prescriptions  Medication Sig Dispense Refill  . CALCIUM-VITAMIN D PO Take 3 tablets by mouth daily.      . Cyanocobalamin (VITAMIN B-12) 2500 MCG SUBL Place 2,500 mcg under the tongue daily.      Marland Kitchen EPINEPHrine (EPI-PEN) 0.3 mg/0.3 mL SOAJ injection Inject 0.3 mg into the muscle once.      Marland Kitchen glucose blood  (ONETOUCH VERIO) test strip Use as instructed to check blood sugar 2 times a day dx code 250.00  100 each  3  . HYDROmorphone (DILAUDID) 4 MG tablet Take by mouth every 4 (four) hours as needed for severe pain.      Marland Kitchen imipramine (TOFRANIL) 10 MG tablet Take 30 mg by mouth at bedtime.      Marland Kitchen isometheptene-acetaminophen-dichloralphenazone (MIDRIN) 65-325-100 MG capsule Take 1 capsule by mouth 4 (four) times daily as needed for migraine. Maximum 5 capsules in 12 hours for migraine headaches, 8 capsules in 24 hours for tension headaches.      Marland Kitchen LORazepam (ATIVAN) 0.5 MG tablet 1 tid  [place] now ! Call if queastions  90 tablet  4  . Multiple Vitamin (MULTIVITAMIN WITH MINERALS) TABS tablet Take 2 tablets by mouth daily.      Marland Kitchen OLANZapine (ZYPREXA) 2.5 MG tablet Take 2.5 mg by mouth 3 (three) times daily as needed (for hemiplegic migraines).      . OnabotulinumtoxinA (BOTOX IJ) Inject 1 each as directed every 3 (three) months. For migraines      . ONETOUCH DELICA LANCETS FINE MISC Use to check blood sugars 2 times per day dx code 250.00  100 each  3  . pravastatin (PRAVACHOL) 40 MG tablet Take 1 tablet (40 mg total) by mouth daily.  30 tablet  2  . promethazine (PHENERGAN) 25 MG suppository Place 1 suppository (25 mg total) rectally every 6 (six) hours as needed for nausea or vomiting.  12 each  0  . temazepam (RESTORIL) 15 MG capsule Take 15-30 mg by mouth at bedtime as needed for sleep.      Marland Kitchen tiZANidine (ZANAFLEX) 4 MG tablet Take 4 mg by mouth daily as needed (for migraines).      . traZODone (DESYREL) 100 MG tablet 2 qhs  60 tablet  5   No current facility-administered medications for this visit.    Lab Results: No results found for this or any previous visit (from the past 48 hour(s)).  Physical Findings: AIMS:  , ,  ,  ,    CIWA:    COWS:     Treatment Plan Summary: At this time we will increase her Ativan to 0.5 mg 3 times a day. I would not go higher  than 2 mg total given in the future. I will go ahead and discontinue her Restoril and begin her on trazodone 100 mg take one at night and it does not work to increase it to 200 mg. The patient will continue in treatment with Richardo Priest and return to see me in 3 months. I believe this patient is very stable at this time.  Plan:  Medical Decision Making Problem Points:  Established problem, stable/improving (1) Data Points:  Review of medication regiment & side effects (2)  I certify that inpatient services furnished can reasonably be expected to improve the patient's condition.   Brooks Kinnan, [place] 06/14/2013, 1:43 PM

## 2013-06-19 ENCOUNTER — Ambulatory Visit (INDEPENDENT_AMBULATORY_CARE_PROVIDER_SITE_OTHER): Payer: BC Managed Care – PPO | Admitting: Licensed Clinical Social Worker

## 2013-06-19 DIAGNOSIS — F411 Generalized anxiety disorder: Secondary | ICD-10-CM | POA: Diagnosis not present

## 2013-06-19 DIAGNOSIS — F331 Major depressive disorder, recurrent, moderate: Secondary | ICD-10-CM

## 2013-06-19 DIAGNOSIS — Z01419 Encounter for gynecological examination (general) (routine) without abnormal findings: Secondary | ICD-10-CM | POA: Diagnosis not present

## 2013-06-20 ENCOUNTER — Other Ambulatory Visit: Payer: Self-pay | Admitting: Obstetrics and Gynecology

## 2013-06-20 DIAGNOSIS — Z1231 Encounter for screening mammogram for malignant neoplasm of breast: Secondary | ICD-10-CM

## 2013-06-28 ENCOUNTER — Ambulatory Visit: Payer: Self-pay

## 2013-07-04 DIAGNOSIS — M79609 Pain in unspecified limb: Secondary | ICD-10-CM | POA: Diagnosis not present

## 2013-07-04 DIAGNOSIS — IMO0002 Reserved for concepts with insufficient information to code with codable children: Secondary | ICD-10-CM | POA: Diagnosis not present

## 2013-07-04 DIAGNOSIS — M5137 Other intervertebral disc degeneration, lumbosacral region: Secondary | ICD-10-CM | POA: Diagnosis not present

## 2013-07-04 DIAGNOSIS — G894 Chronic pain syndrome: Secondary | ICD-10-CM | POA: Diagnosis not present

## 2013-07-07 ENCOUNTER — Ambulatory Visit (INDEPENDENT_AMBULATORY_CARE_PROVIDER_SITE_OTHER): Payer: BC Managed Care – PPO | Admitting: Licensed Clinical Social Worker

## 2013-07-07 DIAGNOSIS — F331 Major depressive disorder, recurrent, moderate: Secondary | ICD-10-CM

## 2013-07-07 DIAGNOSIS — F411 Generalized anxiety disorder: Secondary | ICD-10-CM | POA: Diagnosis not present

## 2013-07-10 ENCOUNTER — Ambulatory Visit
Admission: RE | Admit: 2013-07-10 | Discharge: 2013-07-10 | Disposition: A | Discharge status: Home / Self Care | Payer: BC Managed Care – PPO | Source: Ambulatory Visit | Attending: Obstetrics and Gynecology | Admitting: Obstetrics and Gynecology

## 2013-07-10 DIAGNOSIS — Z1231 Encounter for screening mammogram for malignant neoplasm of breast: Secondary | ICD-10-CM

## 2013-07-17 ENCOUNTER — Ambulatory Visit: Payer: BC Managed Care – PPO | Admitting: Licensed Clinical Social Worker

## 2013-07-20 ENCOUNTER — Encounter (HOSPITAL_COMMUNITY): Payer: Self-pay | Admitting: Emergency Medicine

## 2013-07-20 ENCOUNTER — Emergency Department (HOSPITAL_COMMUNITY): Payer: BC Managed Care – PPO

## 2013-07-20 ENCOUNTER — Emergency Department (HOSPITAL_COMMUNITY)
Admission: EM | Admit: 2013-07-20 | Discharge: 2013-07-20 | Disposition: A | Discharge status: Home / Self Care | Payer: BC Managed Care – PPO | Attending: Emergency Medicine | Admitting: Emergency Medicine

## 2013-07-20 DIAGNOSIS — R011 Cardiac murmur, unspecified: Secondary | ICD-10-CM | POA: Insufficient documentation

## 2013-07-20 DIAGNOSIS — F988 Other specified behavioral and emotional disorders with onset usually occurring in childhood and adolescence: Secondary | ICD-10-CM | POA: Insufficient documentation

## 2013-07-20 DIAGNOSIS — G4730 Sleep apnea, unspecified: Secondary | ICD-10-CM | POA: Diagnosis not present

## 2013-07-20 DIAGNOSIS — I69959 Hemiplegia and hemiparesis following unspecified cerebrovascular disease affecting unspecified side: Secondary | ICD-10-CM | POA: Diagnosis not present

## 2013-07-20 DIAGNOSIS — Z9884 Bariatric surgery status: Secondary | ICD-10-CM | POA: Diagnosis not present

## 2013-07-20 DIAGNOSIS — F449 Dissociative and conversion disorder, unspecified: Secondary | ICD-10-CM | POA: Diagnosis not present

## 2013-07-20 DIAGNOSIS — Z79899 Other long term (current) drug therapy: Secondary | ICD-10-CM | POA: Insufficient documentation

## 2013-07-20 DIAGNOSIS — M6281 Muscle weakness (generalized): Secondary | ICD-10-CM | POA: Insufficient documentation

## 2013-07-20 DIAGNOSIS — E785 Hyperlipidemia, unspecified: Secondary | ICD-10-CM | POA: Diagnosis not present

## 2013-07-20 DIAGNOSIS — F3289 Other specified depressive episodes: Secondary | ICD-10-CM | POA: Insufficient documentation

## 2013-07-20 DIAGNOSIS — G8929 Other chronic pain: Secondary | ICD-10-CM | POA: Diagnosis not present

## 2013-07-20 DIAGNOSIS — F411 Generalized anxiety disorder: Secondary | ICD-10-CM | POA: Insufficient documentation

## 2013-07-20 DIAGNOSIS — R51 Headache: Secondary | ICD-10-CM | POA: Diagnosis not present

## 2013-07-20 DIAGNOSIS — G40909 Epilepsy, unspecified, not intractable, without status epilepticus: Secondary | ICD-10-CM | POA: Insufficient documentation

## 2013-07-20 DIAGNOSIS — Z87828 Personal history of other (healed) physical injury and trauma: Secondary | ICD-10-CM | POA: Insufficient documentation

## 2013-07-20 DIAGNOSIS — E1142 Type 2 diabetes mellitus with diabetic polyneuropathy: Secondary | ICD-10-CM | POA: Insufficient documentation

## 2013-07-20 DIAGNOSIS — Z880 Allergy status to penicillin: Secondary | ICD-10-CM | POA: Insufficient documentation

## 2013-07-20 DIAGNOSIS — Z8719 Personal history of other diseases of the digestive system: Secondary | ICD-10-CM | POA: Diagnosis not present

## 2013-07-20 DIAGNOSIS — E1149 Type 2 diabetes mellitus with other diabetic neurological complication: Secondary | ICD-10-CM | POA: Diagnosis not present

## 2013-07-20 DIAGNOSIS — F329 Major depressive disorder, single episode, unspecified: Secondary | ICD-10-CM | POA: Diagnosis not present

## 2013-07-20 DIAGNOSIS — IMO0002 Reserved for concepts with insufficient information to code with codable children: Secondary | ICD-10-CM | POA: Diagnosis not present

## 2013-07-20 DIAGNOSIS — G43909 Migraine, unspecified, not intractable, without status migrainosus: Secondary | ICD-10-CM | POA: Diagnosis not present

## 2013-07-20 LAB — COMPREHENSIVE METABOLIC PANEL
ALT: 26 U/L (ref 0–35)
AST: 17 U/L (ref 0–37)
Albumin: 4.1 g/dL (ref 3.5–5.2)
Alkaline Phosphatase: 75 U/L (ref 39–117)
BUN: 10 mg/dL (ref 6–23)
CALCIUM: 9 mg/dL (ref 8.4–10.5)
CO2: 26 meq/L (ref 19–32)
CREATININE: 0.57 mg/dL (ref 0.50–1.10)
Chloride: 103 mEq/L (ref 96–112)
GFR calc Af Amer: >90 mL/min (ref >90)
GFR calc non Af Amer: >90 mL/min (ref >90)
Glucose, Bld: 90 mg/dL (ref 70–99)
Potassium: 4.3 mEq/L (ref 3.7–5.3)
Sodium: 143 mEq/L (ref 137–147)
Total Protein: 7.3 g/dL (ref 6.0–8.3)

## 2013-07-20 LAB — I-STAT CHEM 8, ED
BUN: 9 mg/dL (ref 6–23)
CHLORIDE: 101 meq/L (ref 96–112)
Calcium, Ion: 1.11 mmol/L — ABNORMAL LOW (ref 1.12–1.23)
Creatinine, Ser: 0.7 mg/dL (ref 0.50–1.10)
GLUCOSE: 96 mg/dL (ref 70–99)
HCT: 45 % (ref 36.0–46.0)
HEMOGLOBIN: 15.3 g/dL — AB (ref 12.0–15.0)
Potassium: 4.1 mEq/L (ref 3.7–5.3)
Sodium: 141 mEq/L (ref 137–147)
TCO2: 24 mmol/L (ref 0–100)

## 2013-07-20 LAB — I-STAT TROPONIN, ED: Troponin i, poc: 0 ng/mL (ref 0.00–0.08)

## 2013-07-20 LAB — CBC
HEMATOCRIT: 42.5 % (ref 36.0–46.0)
HEMOGLOBIN: 14.3 g/dL (ref 12.0–15.0)
MCH: 28.4 pg (ref 26.0–34.0)
MCHC: 33.6 g/dL (ref 30.0–36.0)
MCV: 84.3 fL (ref 78.0–100.0)
Platelets: 260 10*3/uL (ref 150–400)
RBC: 5.04 MIL/uL (ref 3.87–5.11)
RDW: 13.2 % (ref 11.5–15.5)
WBC: 9.2 10*3/uL (ref 4.0–10.5)

## 2013-07-20 LAB — DIFFERENTIAL
Basophils Absolute: 0.1 10*3/uL (ref 0.0–0.1)
Basophils Relative: 1 % (ref 0–1)
EOS PCT: 3 % (ref 0–5)
Eosinophils Absolute: 0.2 10*3/uL (ref 0.0–0.7)
LYMPHS PCT: 35 % (ref 12–46)
Lymphs Abs: 3.2 10*3/uL (ref 0.7–4.0)
MONO ABS: 0.6 10*3/uL (ref 0.1–1.0)
MONOS PCT: 6 % (ref 3–12)
NEUTROS ABS: 5.2 10*3/uL (ref 1.7–7.7)
Neutrophils Relative %: 55 % (ref 43–77)

## 2013-07-20 LAB — PROTIME-INR
INR: 1.04 (ref 0.00–1.49)
Prothrombin Time: 13.4 seconds (ref 11.6–15.2)

## 2013-07-20 LAB — ETHANOL: Alcohol, Ethyl (B): <11 mg/dL (ref 0–11)

## 2013-07-20 LAB — APTT: APTT: 29 s (ref 24–37)

## 2013-07-20 LAB — CBG MONITORING, ED: GLUCOSE-CAPILLARY: 90 mg/dL (ref 70–99)

## 2013-07-20 MED ORDER — KETOROLAC TROMETHAMINE 30 MG/ML IJ SOLN
30.0000 mg | Freq: Once | INTRAMUSCULAR | Status: AC
  Administered 2013-07-20: 30 mg via INTRAVENOUS
  Filled 2013-07-20: qty 1

## 2013-07-20 MED ORDER — PROMETHAZINE HCL 25 MG/ML IJ SOLN
25.0000 mg | Freq: Once | INTRAMUSCULAR | Status: AC
  Administered 2013-07-20: 25 mg via INTRAVENOUS
  Filled 2013-07-20: qty 1

## 2013-07-20 MED ORDER — VALPROATE SODIUM 500 MG/5ML IV SOLN
1000.0000 mg | Freq: Once | INTRAVENOUS | Status: AC
Start: 2013-07-20 — End: 2013-07-20
  Administered 2013-07-20: 1000 mg via INTRAVENOUS
  Filled 2013-07-20: qty 10

## 2013-07-20 MED ORDER — DIPHENHYDRAMINE HCL 50 MG/ML IJ SOLN
25.0000 mg | Freq: Once | INTRAMUSCULAR | Status: AC
  Administered 2013-07-20: 25 mg via INTRAVENOUS
  Filled 2013-07-20: qty 1

## 2013-07-20 MED ORDER — DIPHENHYDRAMINE HCL 50 MG/ML IJ SOLN: 12.5000 mg | Freq: Once | INTRAMUSCULAR | Status: DC

## 2013-07-20 MED ORDER — KETOROLAC TROMETHAMINE 30 MG/ML IJ SOLN: 30.0000 mg | Freq: Once | INTRAMUSCULAR | Status: DC

## 2013-07-20 NOTE — ED Notes (Signed)
This RN transported pt to MRI

## 2013-07-20 NOTE — ED Notes (Addendum)
Pt able to push self up in bed and roll onto and lay on left side of body independently. Pt refusing to use bedpan for elimination at this time, pt requesting catheterization. Dr. Eulis Foster made aware.

## 2013-07-20 NOTE — ED Notes (Signed)
The patient is unable to give an urine specimen at this time. The patient has been advised to use the call light for assistance to the restroom. The tech has reported to the RN in charge.

## 2013-07-20 NOTE — ED Notes (Signed)
Dr. Eulis Foster notified of pt's symptoms/timeframe.

## 2013-07-20 NOTE — ED Notes (Signed)
Pt presents from home via [facility] with c/o left sided weakness, slurred speech, right sided headache, and right sided neck pain. Pt is able to move left arm but ROM is very limited, pt unable to move LLE. Pt moves RLE and RUE without difficulty, pt dysarthric. PT alert and oriented and is excellent historian, pt with hx complicated migraines with similar symptoms in the past. Pt reports onset of symptoms was yesterday at approx 2000.

## 2013-07-20 NOTE — Discharge Instructions (Signed)
Conversion Disorder Conversion disorder is a type of somatoform disorder. A person with this disorder experiences symptoms that cannot be explained by medical findings. The symptoms are real. They are not faked or created. Symptoms may last for a few days or weeks. Often, a person with this disorder is not overly concerned about the severe symptoms. CAUSES It is unclear what causes conversion disorder. It may be related to:  Recent psychological stress or conflict.  History of medical illness.  History of mental illness.  Family history of somatoform disorder. It is thought that the symptoms may be the result of avoiding an emotional conflict. Often psychological problems are relieved when the physical symptoms appear. The disorder usually occurs in people age 60 through 37. SYMPTOMS Symptoms can be sudden and severe. While the symptoms may not have a physical cause, they may result in discomfort or distress. DIAGNOSIS Diagnostic testing will vary depending on the specific symptoms involved. Evaluation may include:  Physical exam.  Screening health questionnaire.  Medical tests, such as blood tests, urine tests, X-rays, or other imaging tests.  Psychological exam. Ask when your test results will be ready. Make sure you get your test results. TREATMENT There is no cure for conversion disorder, but the symptoms can be managed and sometimes prevented. Treatment aims at teaching coping skills, reducing stress, and preventing new episodes. Once the diagnosis of conversion disorder is made, treatment may include:  Regular monitoring and consultation with a dedicated caregiver for evaluation and coping skills.  Regular monitoring and therapy with a mental health provider to increase understanding of triggers and coping skills. Therapies may include:  Talk therapy.  Stress management training.  Cognitive behavioral therapy.  Hypnosis.  Antidepressant medicines.  Magnetic brain  stimulation.  If paralysis or numbness is involved, physical therapy may be needed to maintain muscle strength.  For other neurological symptoms, occupational therapy may be needed. It can be helpful for a primary caregiver and mental health provider to work together to develop a treatment plan. HOME CARE INSTRUCTIONS  Follow up with your primary caregiver or mental health provider as directed.  Follow up with physical or occupational therapists as directed.  Take medicines as directed.  Try to reduce stress. SEEK MEDICAL CARE IF:  Symptoms do not go away or become severe.  New symptoms appear. SEEK IMMEDIATE MEDICAL CARE IF: Your symptoms are causing distress to the point that you are considering hurting yourself or others. MAKE SURE YOU:   Understand these instructions.  Will watch your condition.  Will get help right away if you are not doing well or get worse. Document Released: 04/11/2010 Document Revised: 06/01/2011 Document Reviewed: 04/11/2010 [facility] Patient Information 2014 [place], [place].  Migraine Headache A migraine headache is an intense, throbbing pain on one or both sides of your head. A migraine can last for 30 minutes to several hours. CAUSES  The exact cause of a migraine headache is not always known. However, a migraine may be caused when nerves in the brain become irritated and release chemicals that cause inflammation. This causes pain. Certain things may also trigger migraines, such as:  Alcohol.  Smoking.  Stress.  Menstruation.  Aged cheeses.  Foods or drinks that contain nitrates, glutamate, aspartame, or tyramine.  Lack of sleep.  Chocolate.  Caffeine.  Hunger.  Physical exertion.  Fatigue.  Medicines used to treat chest pain (nitroglycerine), birth control pills, estrogen, and some blood pressure medicines. SIGNS AND SYMPTOMS  Pain on one or both sides of your  head.  Pulsating or throbbing pain.  Severe pain that  prevents daily activities.  Pain that is aggravated by any physical activity.  Nausea, vomiting, or both.  Dizziness.  Pain with exposure to bright lights, loud noises, or activity.  General sensitivity to bright lights, loud noises, or smells. Before you get a migraine, you may get warning signs that a migraine is coming (aura). An aura may include:  Seeing flashing lights.  Seeing bright spots, halos, or zig-zag lines.  Having tunnel vision or blurred vision.  Having feelings of numbness or tingling.  Having trouble talking.  Having muscle weakness. DIAGNOSIS  A migraine headache is often diagnosed based on:  Symptoms.  Physical exam.  A CT scan or MRI of your head. These imaging tests cannot diagnose migraines, but they can help rule out other causes of headaches. TREATMENT Medicines may be given for pain and nausea. Medicines can also be given to help prevent recurrent migraines.  HOME CARE INSTRUCTIONS  Only take over-the-counter or prescription medicines for pain or discomfort as directed by your health care provider. The use of long-term narcotics is not recommended.  Lie down in a dark, quiet room when you have a migraine.  Keep a journal to find out what may trigger your migraine headaches. For example, write down:  What you eat and drink.  How much sleep you get.  Any change to your diet or medicines.  Limit alcohol consumption.  Quit smoking if you smoke.  Get 7 9 hours of sleep, or as recommended by your health care provider.  Limit stress.  Keep lights dim if bright lights bother you and make your migraines worse. SEEK IMMEDIATE MEDICAL CARE IF:   Your migraine becomes severe.  You have a fever.  You have a stiff neck.  You have vision loss.  You have muscular weakness or loss of muscle control.  You start losing your balance or have trouble walking.  You feel faint or pass out.  You have severe symptoms that are different from  your first symptoms. MAKE SURE YOU:   Understand these instructions.  Will watch your condition.  Will get help right away if you are not doing well or get worse. Document Released: 03/09/2005 Document Revised: 12/28/2012 Document Reviewed: 11/14/2012 [facility] Patient Information 2014 [place].

## 2013-07-20 NOTE — ED Provider Notes (Signed)
CSN: [id number]     Arrival date & time 07/20/13  1716 History   None    Chief Complaint  Patient presents with  . Extremity Weakness  . Migraine     (Consider location/radiation/quality/duration/timing/severity/associated sxs/prior Treatment) Patient is a 38 y.o. female presenting with extremity weakness and migraines. The history is provided by the patient.  Extremity Weakness  Migraine    The patient was seen at 18:00 hours. She complains of a headache that started yesterday, right-sided, and slurred speech with inability to use her left arm or leg, at 10 AM this morning. He came here by EMS. She states that her daughter called EMS. She reports having a "TIA" greater than 5 years ago. She has conversion disorder, and regularly sees her psychiatrist. She denies fever, chills, nausea, vomiting, cough, shortness of breath, or abdominal pain. She states that she had chest pain, that has been persistent, and started at 1730.  Past Medical History  Diagnosis Date  . Depression   . Anxiety   . Diabetes mellitus   . GERD (gastroesophageal reflux disease)   . ADD (attention deficit disorder)   . Hyperlipemia   . Herniated disc   . Allergy     Seasonal  . Diabetic neuropathy   . Shortness of breath     with exertion   . Sleep apnea     obsructive settings at 13   . Heart murmur     asa child  . Seizures     hx of / month ago - dehydration   . 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 Degenerative disc disease   . Annular disc tear   . Retrolisthesis     L4 and L5  . Bulging disc   . Chronic pain 04/20/13    Medication prescribed by Vira Blanco, M.D.   Past Surgical History  Procedure Laterality Date  . Cholecystectomy    . Cesarean section    . Tonsillectomy    . Laporoscopy  2007  . Breath tek h pylori  09/11/2011    Procedure: BREATH TEK H PYLORI;  Surgeon: Edward Jolly, MD;  Location: [facility];  Service:  General;  Laterality: N/A;  . 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 Bowel resection  11/30/2011    Procedure: SMALL BOWEL RESECTION;  Surgeon: Edward Jolly, MD;  Location: [facility];  Service: General;;  . Esophagogastroduodenoscopy  03/14/2012    Procedure: ESOPHAGOGASTRODUODENOSCOPY (EGD);  Surgeon: Shann Medal, MD;  Location: [facility];  Service: General;  Laterality: N/A;  . Esophagogastroduodenoscopy  04/25/2012    Procedure: ESOPHAGOGASTRODUODENOSCOPY (EGD);  Surgeon: Shann Medal, MD;  Location: [facility];  Service: General;  Laterality: N/A;  . Esophagogastroduodenoscopy N/A 03/31/2013    Procedure: ESOPHAGOGASTRODUODENOSCOPY (EGD);  Surgeon: Shann Medal, MD;  Location: [facility];  Service: General;  Laterality: N/A;   Family History  Problem Relation Age of Onset  . Diabetes type II    . Diabetes Mother   . Hypertension Mother   . Depression Mother   . OCD Mother   . Depression Brother   . Cancer Maternal Aunt     ovarian   . Depression Maternal Aunt   . Diabetes Maternal Aunt   . Cancer Maternal Grandmother  Lung  . COPD Maternal Grandmother   . Cancer Maternal Grandfather     Prostate  . Diabetes Maternal Grandfather   . Hearing loss Maternal Grandfather   . Hypertension Maternal Grandfather   . Stroke Maternal Grandfather   . Cancer Paternal Grandmother     Colon   History  Substance Use Topics  . Smoking status: Never Smoker   . Smokeless tobacco: Never Used  . Alcohol Use: No   OB History   Grav Para Term Preterm Abortions TAB SAB Ect Mult Living                 Review of Systems  Musculoskeletal: Positive for extremity weakness.  All other systems reviewed and are negative.     Allergies  Ace inhibitors; Atarax; Cephalosporins; Codeine; Compazine; Decadron; Demerol; Metoclopramide hcl; Morphine and  related; Other; Penicillins; Pork-derived products; Prozac; Shellfish-derived products; Sulfa antibiotics; Triptans; Zanaflex; Zofran; Dihydroergotamine; and Periactin  Home Medications   Prior to Admission medications   Medication Sig Start Date End Date Taking? Authorizing Provider  CALCIUM-VITAMIN D PO Take 3 tablets by mouth daily.    Historical Provider, MD  Cyanocobalamin (VITAMIN B-12) 2500 MCG SUBL Place 2,500 mcg under the tongue daily.    Historical Provider, MD  EPINEPHrine (EPI-PEN) 0.3 mg/0.3 mL SOAJ injection Inject 0.3 mg into the muscle once.    Historical Provider, MD  glucose blood (ONETOUCH VERIO) test strip Use as instructed to check blood sugar 2 times a day dx code 250.00 05/29/13   Elayne Snare, MD  HYDROmorphone (DILAUDID) 4 MG tablet Take by mouth every 4 (four) hours as needed for severe pain.    Historical Provider, MD  imipramine (TOFRANIL) 10 MG tablet Take 30 mg by mouth at bedtime.    Historical Provider, MD  isometheptene-acetaminophen-dichloralphenazone (MIDRIN) 65-325-100 MG capsule Take 1 capsule by mouth 4 (four) times daily as needed for migraine. Maximum 5 capsules in 12 hours for migraine headaches, 8 capsules in 24 hours for tension headaches.    Historical Provider, MD  LORazepam (ATIVAN) 0.5 MG tablet 1 tid  [place] ! Call if queastions 06/14/13   Norma Fredrickson, MD  Multiple Vitamin (MULTIVITAMIN WITH MINERALS) TABS tablet Take 2 tablets by mouth daily.    Historical Provider, MD  OLANZapine (ZYPREXA) 2.5 MG tablet Take 2.5 mg by mouth 3 (three) times daily as needed (for hemiplegic migraines).    Historical Provider, MD  OnabotulinumtoxinA (BOTOX IJ) Inject 1 each as directed every 3 (three) months. For migraines    Historical Provider, MD  [facility] DELICA LANCETS FINE MISC Use to check blood sugars 2 times per day dx code 250.00 05/29/13   Elayne Snare, MD  pravastatin (PRAVACHOL) 40 MG tablet Take 1 tablet (40 mg total) by  mouth daily. 05/29/13   Elayne Snare, MD  promethazine (PHENERGAN) 25 MG suppository Place 1 suppository (25 mg total) rectally every 6 (six) hours as needed for nausea or vomiting. 04/21/13   Richarda Blade, MD  temazepam (RESTORIL) 15 MG capsule Take 15-30 mg by mouth at bedtime as needed for sleep.    Historical Provider, MD  tiZANidine (ZANAFLEX) 4 MG tablet Take 4 mg by mouth daily as needed (for migraines).    Historical Provider, MD  traZODone (DESYREL) 100 MG tablet 2 qhs 06/14/13   Norma Fredrickson, MD   BP 109/76  Pulse 96  Temp(Src) 98.4 F (36.9 C) (Oral)  Resp 18  Ht 5'  7" (1.702 m)  Wt 173 lb (78.472 kg)  BMI 27.09 kg/m2  SpO2 97% Physical Exam  Nursing note and vitals reviewed. Constitutional: She is oriented to person, place, and time. She appears well-developed and well-nourished.  HENT:  Head: Normocephalic and atraumatic.  Eyes: Conjunctivae and EOM are normal. Pupils are equal, round, and reactive to light.  Neck: Normal range of motion and phonation normal. Neck supple.  Cardiovascular: Normal rate, regular rhythm and intact distal pulses.   Pulmonary/Chest: Effort normal and breath sounds normal. She exhibits no tenderness.  Abdominal: Soft. She exhibits no distension. There is no tenderness. There is no guarding.  Musculoskeletal: Normal range of motion.  Neurological: She is alert and oriented to person, place, and time. She exhibits normal muscle tone.  Dysarthria is present. There is no facial asymmetry. She has flaccid paralysis of the left arm and left leg. There is no nystagmus or ataxia. He is able to maneuver to a sitting position by using her right arm to pull herself up in bed. There is no aphasia.  Skin: Skin is warm and dry.  Psychiatric: She has a normal mood and affect. Her behavior is normal. Judgment and thought content normal.    ED Course  Procedures (including critical care time)  Medications  valproate (DEPACON) 1,000 mg in dextrose 5 % 50 mL  IVPB (not administered)  promethazine (PHENERGAN) injection 25 mg (not administered)    Patient Vitals for the past 24 hrs:  BP Temp Temp src Pulse Resp SpO2 Height Weight  07/20/13 1733 109/76 mmHg 98.4 F (36.9 C) Oral 96 18 97 % _0  (1.702 m) 173 lb (78.472 kg)  07/20/13 1725 104/70 mmHg - - - - - - -  07/20/13 1724 - - - - - 100 % - -    10:12 PM Reevaluation with update and discussion. After initial assessment and treatment, an updated evaluation reveals No relief of pain with Valproate. Toradol and Benadryl ordered. Extensive allergies noted. Richarda Blade    7:48 PM-Consult complete with Dr. Leonel Ramsay. Patient case explained and discussed. He agrees to see patient for further evaluation and treatment, if MR reveals CVA. Call ended at 19:54  11:26 PM Reevaluation with update and discussion. After initial assessment and treatment, an updated evaluation reveals she is no longer dysarthric. States that she can only move her left leg, by pushing it with her right leg. She feels comfortable enough to go home now. Findings discussed with patient, all questions answered.Richarda Blade   Labs Review Labs Reviewed  COMPREHENSIVE METABOLIC PANEL - Abnormal; Notable for the following:    Total Bilirubin <0.2 (*)    All other components within normal limits  I-STAT CHEM 8, ED - Abnormal; Notable for the following:    Calcium, Ion 1.11 (*)    Hemoglobin 15.3 (*)    All other components within normal limits  PROTIME-INR  APTT  CBC  DIFFERENTIAL  ETHANOL  URINE RAPID DRUG SCREEN (HOSP PERFORMED)  URINALYSIS, ROUTINE W REFLEX MICROSCOPIC  CBG MONITORING, ED  I-STAT TROPOININ, ED  I-STAT TROPOININ, ED    Imaging Review Ct Head Wo Contrast  07/20/2013   CLINICAL DATA:  Left hemiparesis, slurred speech, and right-sided headache  EXAM: CT HEAD WITHOUT CONTRAST  TECHNIQUE: Contiguous axial images were obtained from the base of the skull through the vertex without intravenous  contrast.  COMPARISON:  CT HEAD W/O CM dated 12/24/2012  FINDINGS: The ventricles are normal in size and position.  There is no intracranial hemorrhage nor intracranial mass effect. There is no evidence of an evolving ischemic infarction. The cerebellum and brainstem are normal in density. There are no abnormal intracranial calcifications.  At bone window settings the observed portions of the paranasal sinuses and mastoid air cells are clear. There is no evidence of an acute skull fracture.  IMPRESSION: There is no CT evidence of an evolving ischemic infarction. There is no evidence of an acute intracranial hemorrhage or hemorrhagic infarction. There is no intracranial mass effect.   Electronically Signed   By: David  Martinique   On: 07/20/2013 19:16       Date: 07/20/13  Rate: 96  Rhythm: normal sinus rhythm  QRS Axis: normal  PR and QT Intervals: normal  ST/T Wave abnormalities: normal  PR and QRS Conduction Disutrbances:none  Narrative Interpretation:   Old EKG Reviewed: unchanged    EKG Interpretation None      MDM   Final diagnoses:  Conversion disorder  Migraine    Migraine headache with hemiplegic. Similar findings in past. Overlie of narcotics use, chronic pain, and conversion disorder. Doubt acute stroke. MRI is negative. Doubt TIA, meningitis, metabolic instability, or spinal myelopathy.  Nursing Notes Reviewed/ Care Coordinated Applicable Imaging Reviewed Interpretation of Laboratory Data incorporated into ED treatment  The patient appears reasonably screened and/or stabilized for discharge and I doubt any other medical condition or other [facility] requiring further screening, evaluation, or treatment in the ED at this time prior to discharge.  Plan: Home Medications- usual; Home Treatments- rest; return here if the recommended treatment, does not improve the symptoms; Recommended follow up- PCP prn   Richarda Blade, MD 07/20/13 2333

## 2013-07-20 NOTE — ED Notes (Signed)
Dr. Wentz at bedside. 

## 2013-07-25 DIAGNOSIS — G43709 Chronic migraine without aura, not intractable, without status migrainosus: Secondary | ICD-10-CM | POA: Diagnosis not present

## 2013-07-26 ENCOUNTER — Ambulatory Visit (HOSPITAL_COMMUNITY): Payer: Self-pay | Admitting: Psychiatry

## 2013-08-02 ENCOUNTER — Telehealth (HOSPITAL_COMMUNITY): Payer: Self-pay | Admitting: *Deleted

## 2013-08-02 NOTE — Telephone Encounter (Signed)
Patient left OI:NOMV message earlier in week. Wants to talk with Dr.Plovsky today about her anxiety and insomnia.Knows he is not here except Wednesday and Friday

## 2013-08-02 NOTE — Telephone Encounter (Signed)
Patient left BT:DVVOHYW has increased in past week.MD gave Ativan 0.5mg  TID & Trazodone 100mg .Not really helping at this time.Can Ativan be changed to something different or increased?

## 2013-08-02 NOTE — Telephone Encounter (Signed)
Increase  Ativan .5  2 BID

## 2013-08-03 ENCOUNTER — Other Ambulatory Visit (HOSPITAL_COMMUNITY): Payer: Self-pay | Admitting: *Deleted

## 2013-08-03 ENCOUNTER — Telehealth (HOSPITAL_COMMUNITY): Payer: Self-pay | Admitting: *Deleted

## 2013-08-03 DIAGNOSIS — F4323 Adjustment disorder with mixed anxiety and depressed mood: Secondary | ICD-10-CM

## 2013-08-03 MED ORDER — LORAZEPAM 0.5 MG PO TABS: 1.0000 mg | ORAL_TABLET | Freq: Two times a day (BID) | ORAL | Status: DC

## 2013-08-03 NOTE — Telephone Encounter (Deleted)
08/02/13 1645:Advised patient that per Dr.Plovsky,increase Ativan 0.5 mg to 2 in AM and 2 in PM.Will call in new RX tomorrow with new dose/directions ordered by MD as pt will run out sooner.

## 2013-08-03 NOTE — Telephone Encounter (Signed)
08/02/13 1645:Advised patient that per Dr.Plovsky,increase Ativan 0.5 mg to 2 in AM and 2 in PM.Will call in new RX tomorrow with new dose/directions ordered by MD as pt will run out sooner.Pt states anxiety increased with insertion of pain control device in back 5/14

## 2013-08-07 ENCOUNTER — Other Ambulatory Visit (HOSPITAL_COMMUNITY): Payer: Self-pay | Admitting: *Deleted

## 2013-08-07 DIAGNOSIS — M47817 Spondylosis without myelopathy or radiculopathy, lumbosacral region: Secondary | ICD-10-CM | POA: Diagnosis not present

## 2013-08-07 DIAGNOSIS — M5137 Other intervertebral disc degeneration, lumbosacral region: Secondary | ICD-10-CM | POA: Diagnosis not present

## 2013-08-07 DIAGNOSIS — F4323 Adjustment disorder with mixed anxiety and depressed mood: Secondary | ICD-10-CM

## 2013-08-07 DIAGNOSIS — M79609 Pain in unspecified limb: Secondary | ICD-10-CM | POA: Diagnosis not present

## 2013-08-07 DIAGNOSIS — G894 Chronic pain syndrome: Secondary | ICD-10-CM | POA: Diagnosis not present

## 2013-08-07 MED ORDER — TRAZODONE HCL 100 MG PO TABS: 200.0000 mg | ORAL_TABLET | Freq: Every day | ORAL | Status: DC

## 2013-08-10 DIAGNOSIS — M5137 Other intervertebral disc degeneration, lumbosacral region: Secondary | ICD-10-CM | POA: Diagnosis not present

## 2013-08-10 DIAGNOSIS — IMO0002 Reserved for concepts with insufficient information to code with codable children: Secondary | ICD-10-CM | POA: Diagnosis not present

## 2013-08-10 DIAGNOSIS — M79609 Pain in unspecified limb: Secondary | ICD-10-CM | POA: Diagnosis not present

## 2013-08-11 ENCOUNTER — Telehealth (INDEPENDENT_AMBULATORY_CARE_PROVIDER_SITE_OTHER): Payer: Self-pay

## 2013-08-11 NOTE — Telephone Encounter (Signed)
Pts husband is calling stating that pt is going to be going into a long-term Mental Health facility. Husband would like to know if we could give him a list of foods that the pt is and is not allowed to be eating so that he can give this to the facility. After speaking with Alyse Low, advised pt that he would benefit more from calling the Nutritionistm which he verbalized understanding and agrees with this. Pt would also like to know if Dr Excell Seltzer would type up a letter stating his impressions of patients medical needs. Informed pt that I would send this to Dr Excell Seltzer and ask. Pt states that if Dr Excell Seltzer had any questions of what he needs to call him [phone].

## 2013-09-01 ENCOUNTER — Telehealth (INDEPENDENT_AMBULATORY_CARE_PROVIDER_SITE_OTHER): Payer: Self-pay | Admitting: *Deleted

## 2013-09-01 NOTE — Telephone Encounter (Signed)
Mr. Gomez called this morning requesting a new rx for the nasal spray: Nascobal 1.25 ml to be called into their local pharmacy, [facility]. [place] [phone].  He advised that Dr. Aurelio Jew normally prescribed this for the pt.  I advised him that I would have to send Dr. Excell Seltzer a message and see if he would approve this, and if it was, then it would be sent to his pharmacy.  He verbalized understanding and was in agreeance.  Anderson Malta

## 2013-09-04 ENCOUNTER — Other Ambulatory Visit: Payer: Self-pay

## 2013-09-07 DIAGNOSIS — Z79899 Other long term (current) drug therapy: Secondary | ICD-10-CM | POA: Diagnosis not present

## 2013-09-07 DIAGNOSIS — Z9884 Bariatric surgery status: Secondary | ICD-10-CM | POA: Diagnosis not present

## 2013-09-07 DIAGNOSIS — R109 Unspecified abdominal pain: Secondary | ICD-10-CM | POA: Diagnosis not present

## 2013-09-07 DIAGNOSIS — R112 Nausea with vomiting, unspecified: Secondary | ICD-10-CM | POA: Diagnosis not present

## 2013-09-07 DIAGNOSIS — R1012 Left upper quadrant pain: Secondary | ICD-10-CM | POA: Diagnosis not present

## 2013-09-07 DIAGNOSIS — K5900 Constipation, unspecified: Secondary | ICD-10-CM | POA: Diagnosis not present

## 2013-09-07 DIAGNOSIS — K2970 Gastritis, unspecified, without bleeding: Secondary | ICD-10-CM | POA: Diagnosis not present

## 2013-09-08 ENCOUNTER — Ambulatory Visit: Payer: Self-pay | Admitting: Endocrinology

## 2013-09-08 DIAGNOSIS — R109 Unspecified abdominal pain: Secondary | ICD-10-CM | POA: Diagnosis not present

## 2013-09-20 ENCOUNTER — Ambulatory Visit (HOSPITAL_COMMUNITY): Payer: Self-pay | Admitting: Psychiatry

## 2013-09-26 ENCOUNTER — Ambulatory Visit (INDEPENDENT_AMBULATORY_CARE_PROVIDER_SITE_OTHER): Payer: Self-pay | Admitting: Licensed Clinical Social Worker

## 2013-09-26 DIAGNOSIS — R262 Difficulty in walking, not elsewhere classified: Secondary | ICD-10-CM | POA: Diagnosis not present

## 2013-09-26 DIAGNOSIS — F331 Major depressive disorder, recurrent, moderate: Secondary | ICD-10-CM

## 2013-09-26 DIAGNOSIS — M545 Low back pain, unspecified: Secondary | ICD-10-CM | POA: Diagnosis not present

## 2013-09-26 DIAGNOSIS — F411 Generalized anxiety disorder: Secondary | ICD-10-CM

## 2013-09-26 DIAGNOSIS — M6281 Muscle weakness (generalized): Secondary | ICD-10-CM | POA: Diagnosis not present

## 2013-09-29 DIAGNOSIS — M545 Low back pain, unspecified: Secondary | ICD-10-CM | POA: Diagnosis not present

## 2013-09-29 DIAGNOSIS — R262 Difficulty in walking, not elsewhere classified: Secondary | ICD-10-CM | POA: Diagnosis not present

## 2013-09-29 DIAGNOSIS — M6281 Muscle weakness (generalized): Secondary | ICD-10-CM | POA: Diagnosis not present

## 2013-10-03 DIAGNOSIS — M545 Low back pain, unspecified: Secondary | ICD-10-CM | POA: Diagnosis not present

## 2013-10-03 DIAGNOSIS — M6281 Muscle weakness (generalized): Secondary | ICD-10-CM | POA: Diagnosis not present

## 2013-10-03 DIAGNOSIS — R262 Difficulty in walking, not elsewhere classified: Secondary | ICD-10-CM | POA: Diagnosis not present

## 2013-10-06 DIAGNOSIS — M6281 Muscle weakness (generalized): Secondary | ICD-10-CM | POA: Diagnosis not present

## 2013-10-06 DIAGNOSIS — M545 Low back pain, unspecified: Secondary | ICD-10-CM | POA: Diagnosis not present

## 2013-10-06 DIAGNOSIS — R262 Difficulty in walking, not elsewhere classified: Secondary | ICD-10-CM | POA: Diagnosis not present

## 2013-11-20 DIAGNOSIS — G8929 Other chronic pain: Secondary | ICD-10-CM | POA: Diagnosis not present

## 2013-11-29 ENCOUNTER — Other Ambulatory Visit: Payer: Self-pay | Admitting: Dermatology

## 2013-12-01 ENCOUNTER — Telehealth: Payer: Self-pay | Admitting: Endocrinology

## 2013-12-01 NOTE — Telephone Encounter (Signed)
Billing called and stated that patient would like to know what forms is being filled out. [facility] phone # [phone]

## 2013-12-03 IMAGING — CT CT ABD-PELV W/ CM
1 of 3 series · 14 of 32 positions shown, 19 images · IV contrast (OMNIPAQUE 300)
Comparison: CT of the abdomen and pelvis 09/18/2012.

CLINICAL DATA: Left-sided abdominal pain.  History of prior gastric
bypass surgery.  Nausea and diarrhea.

CT ABDOMEN AND PELVIS WITH CONTRAST
TECHNIQUE: Multidetector CT imaging of the abdomen and pelvis was
performed following the standard protocol during bolus
administration of intravenous contrast.
Contrast: 50mL OMNIPAQUE IOHEXOL 300 MG/ML  SOLN, 100mL OMNIPAQUE
IOHEXOL 300 MG/ML  SOLN

[Series 2: abd/pel with · axial · 0.74mm/px · z∈[+1130,+1564]mm · 14 of 97 slices shown, 19 images]
[im 5/97  soft-tissue]
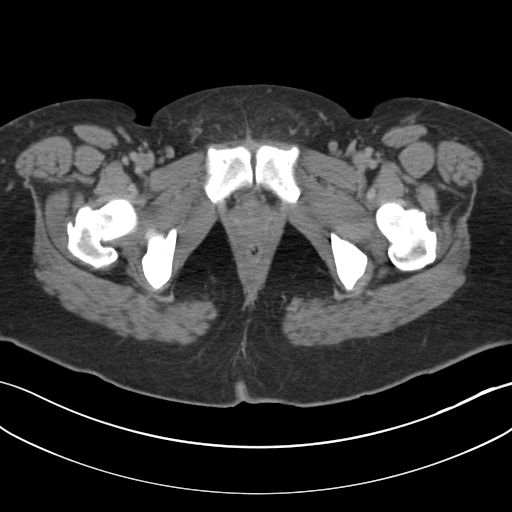
[im 5/97  bone]
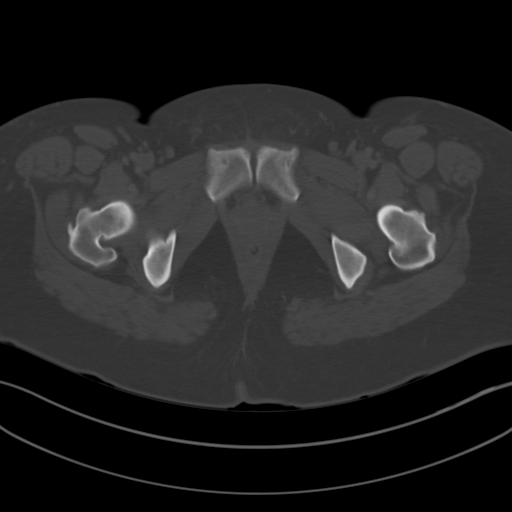
[im 15/97  soft-tissue]
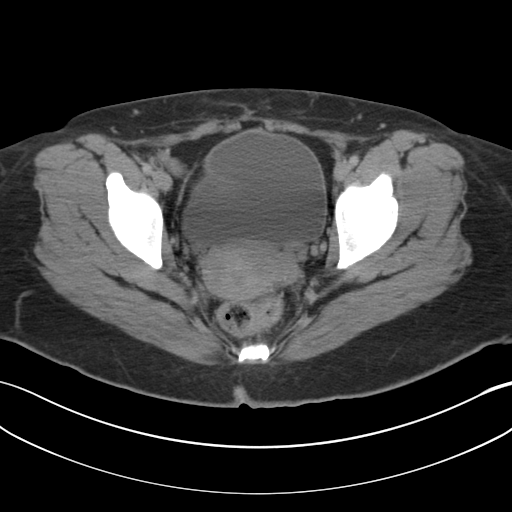
[im 20/97  soft-tissue]
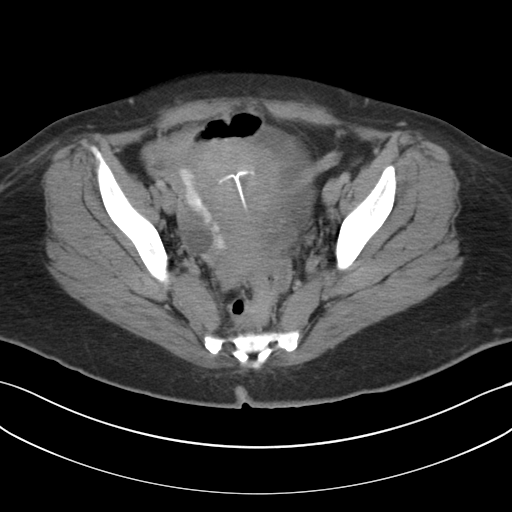
[im 29/97  soft-tissue]
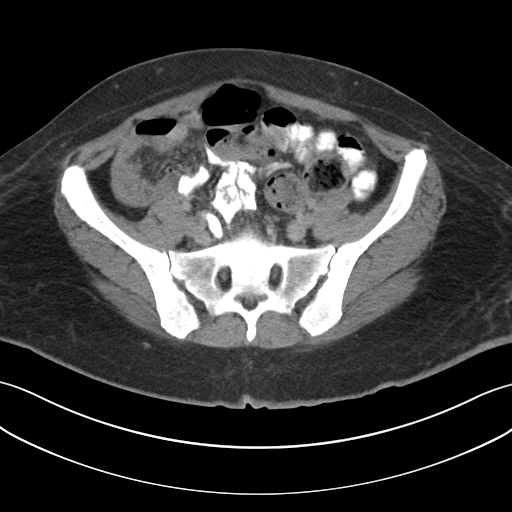
[im 34/97  soft-tissue]
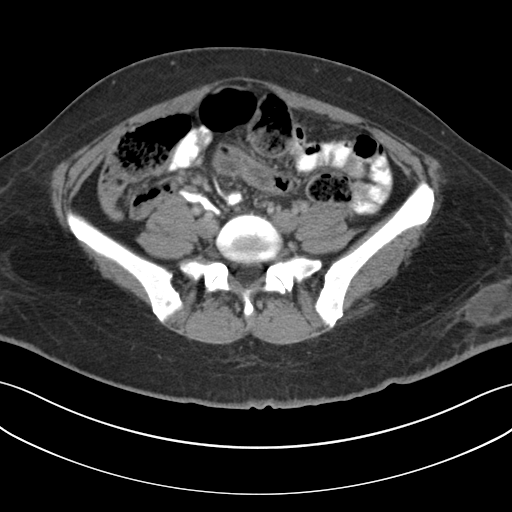
[im 44/97  soft-tissue]
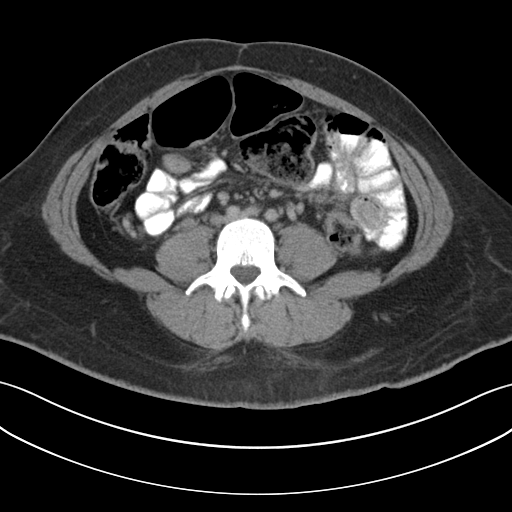
[im 49/97  soft-tissue]
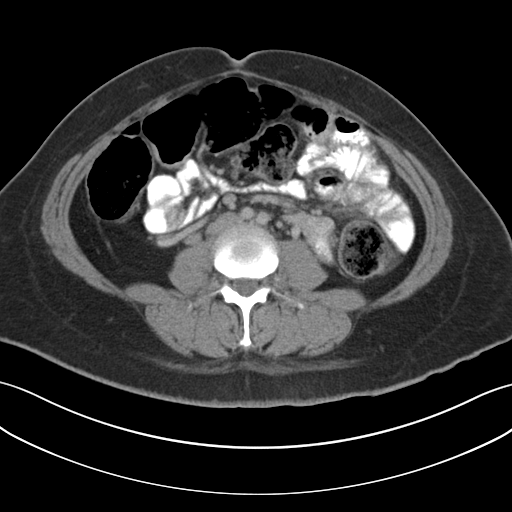
[im 53/97  soft-tissue]
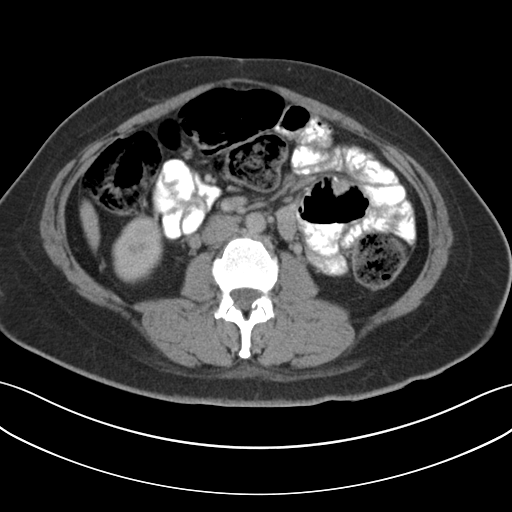
[im 63/97  soft-tissue]
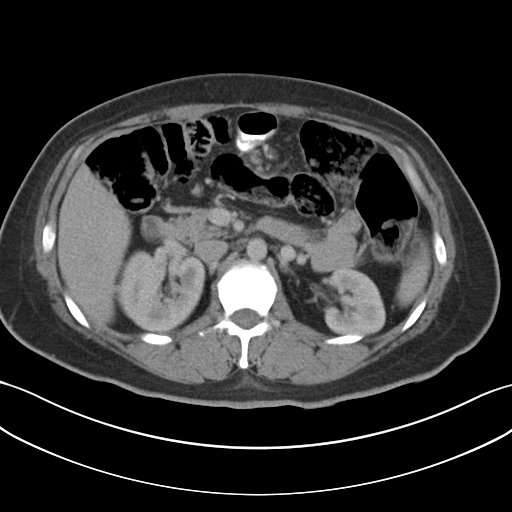
[im 63/97  bone]
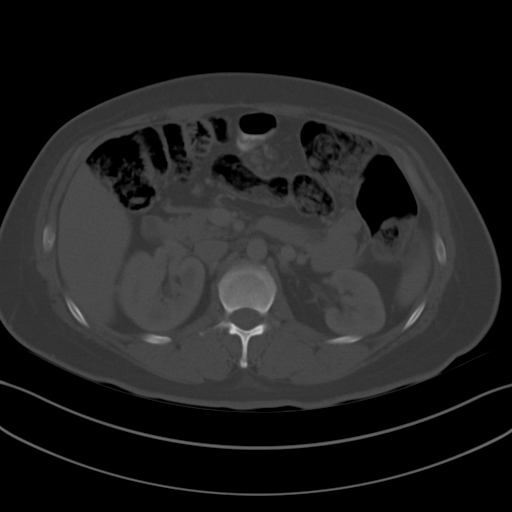
[im 68/97  soft-tissue]
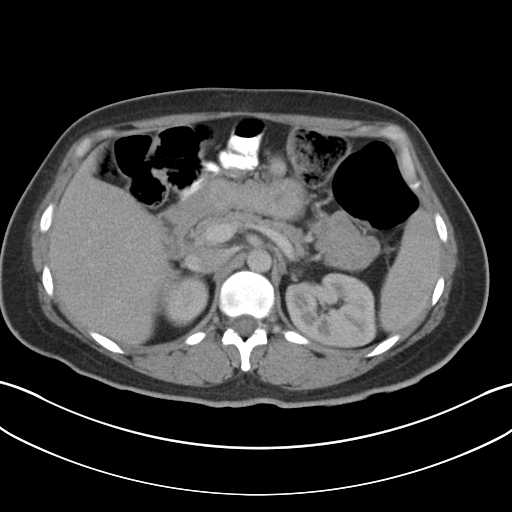
[im 77/97  soft-tissue]
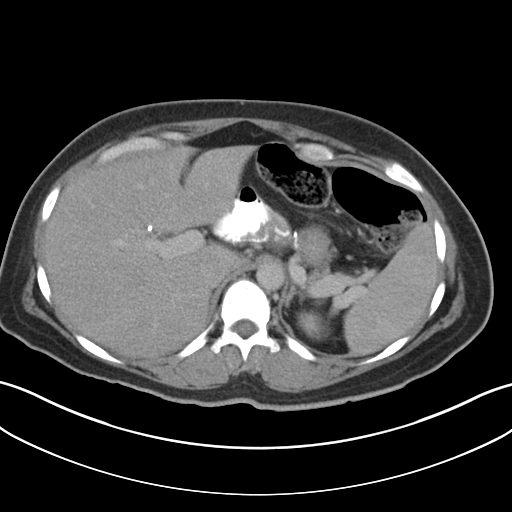
[im 77/97  lung]
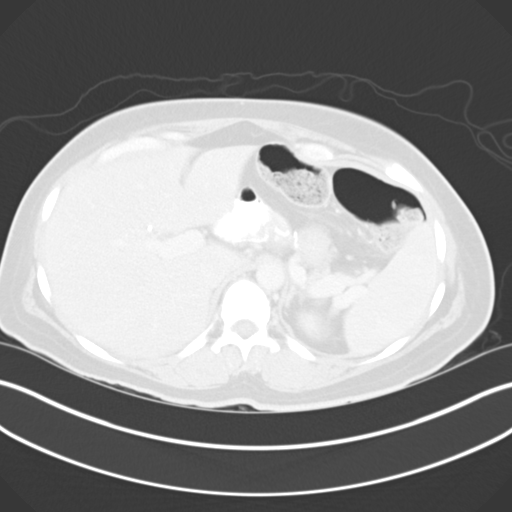
[im 82/97  soft-tissue]
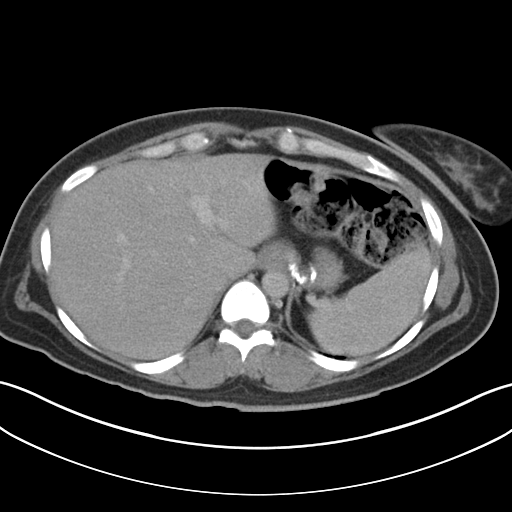
[im 82/97  lung]
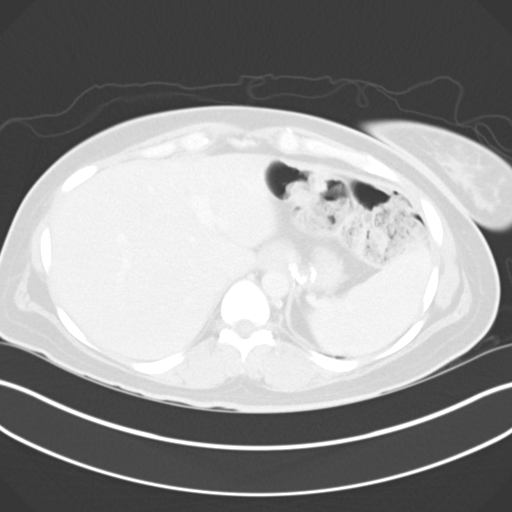
[im 87/97  lung]
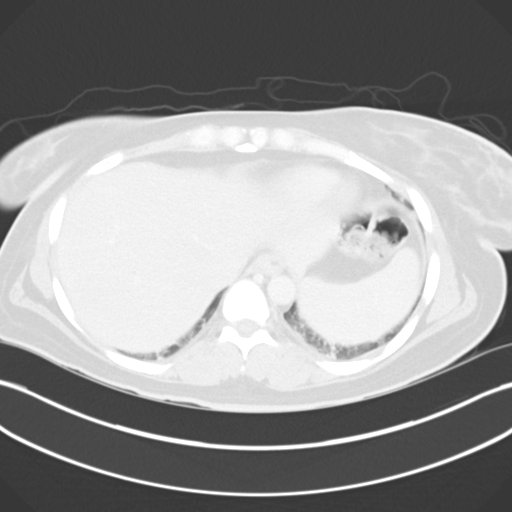
[im 92/97  soft-tissue]
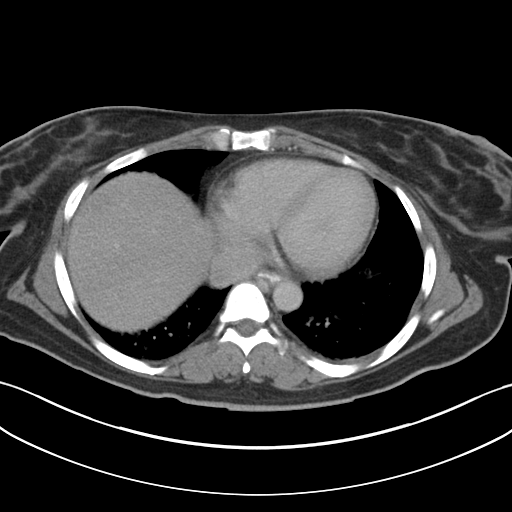
[im 92/97  lung]
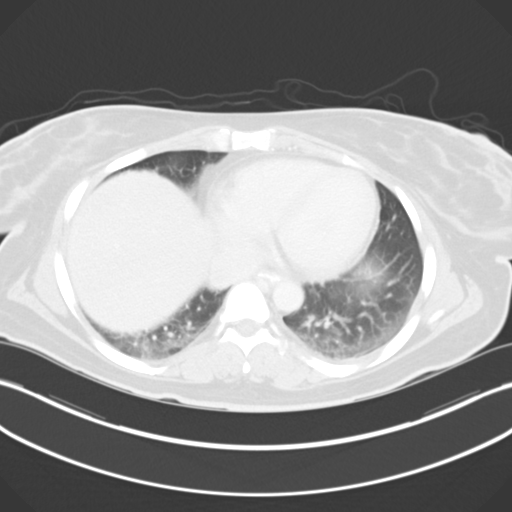

[14 of 32 positions shown; findings below may reference images not displayed]

FINDINGS: Lung Bases: Dependent atelectasis in the lower lobes of the lungs
bilaterally.

Abdomen/Pelvis:  Status post cholecystectomy.  The appearance of
the liver, pancreas, spleen, bilateral adrenal glands and bilateral
kidneys is unremarkable.  Postoperative changes of gastric bypass
procedure are noted.  No significant volume of ascites.  No
pneumoperitoneum.  No pathologic distension of small bowel.  Large
volume of stool throughout the colon may suggest constipation. IUD
in place within the endometrial canal.  Uterus and ovaries are
otherwise unremarkable in appearance.  No definite pathologic
lymphadenopathy identified within the abdomen or pelvis. Urinary
bladder is normal in appearance.

Musculoskeletal: There are no aggressive appearing lytic or blastic
lesions noted in the visualized portions of the skeleton.
IMPRESSION: 1.  No acute findings in the abdomen or pelvis to account for the
patient's symptoms.
2.  However, there is a relatively large volume of stool throughout
the colon which may suggest constipation.
3.  Status post cholecystectomy.
4.  IUD in position.

## 2013-12-08 ENCOUNTER — Other Ambulatory Visit (INDEPENDENT_AMBULATORY_CARE_PROVIDER_SITE_OTHER): Payer: Self-pay

## 2013-12-08 ENCOUNTER — Ambulatory Visit (INDEPENDENT_AMBULATORY_CARE_PROVIDER_SITE_OTHER): Payer: BC Managed Care – PPO | Admitting: General Surgery

## 2013-12-08 DIAGNOSIS — Z09 Encounter for follow-up examination after completed treatment for conditions other than malignant neoplasm: Secondary | ICD-10-CM

## 2013-12-08 DIAGNOSIS — K912 Postsurgical malabsorption, not elsewhere classified: Secondary | ICD-10-CM

## 2013-12-08 DIAGNOSIS — E119 Type 2 diabetes mellitus without complications: Secondary | ICD-10-CM

## 2013-12-14 DIAGNOSIS — H9209 Otalgia, unspecified ear: Secondary | ICD-10-CM | POA: Diagnosis not present

## 2013-12-14 DIAGNOSIS — H9319 Tinnitus, unspecified ear: Secondary | ICD-10-CM | POA: Diagnosis not present

## 2013-12-14 DIAGNOSIS — H902 Conductive hearing loss, unspecified: Secondary | ICD-10-CM | POA: Diagnosis not present

## 2013-12-21 DIAGNOSIS — K912 Postsurgical malabsorption, not elsewhere classified: Secondary | ICD-10-CM | POA: Diagnosis not present

## 2013-12-21 LAB — VITAMIN B12: Vitamin B-12: 1104 pg/mL — ABNORMAL HIGH (ref 211–911)

## 2013-12-22 LAB — FOLATE: Folate: 19.1 ng/mL

## 2013-12-24 LAB — VITAMIN D 1,25 DIHYDROXY
Vitamin D 1, 25 (OH)2 Total: 83 pg/mL — ABNORMAL HIGH (ref 18–72)
Vitamin D3 1, 25 (OH)2: 83 pg/mL

## 2013-12-25 LAB — VITAMIN B1: Vitamin B1 (Thiamine): 15 nmol/L (ref 8–30)

## 2014-01-15 ENCOUNTER — Other Ambulatory Visit: Payer: Self-pay

## 2014-01-18 ENCOUNTER — Ambulatory Visit: Payer: Self-pay | Admitting: Endocrinology

## 2014-04-06 ENCOUNTER — Ambulatory Visit (HOSPITAL_COMMUNITY): Admission: RE | Admit: 2014-04-06 | Payer: BLUE CROSS/BLUE SHIELD | Source: Ambulatory Visit

## 2014-04-06 ENCOUNTER — Inpatient Hospital Stay (HOSPITAL_COMMUNITY)
Admission: AD | Admit: 2014-04-06 | Discharge: 2014-04-10 | DRG: 392 | Disposition: A | Discharge status: Home / Self Care | Payer: BLUE CROSS/BLUE SHIELD | Source: Ambulatory Visit | Attending: General Surgery | Admitting: General Surgery

## 2014-04-06 ENCOUNTER — Other Ambulatory Visit (INDEPENDENT_AMBULATORY_CARE_PROVIDER_SITE_OTHER): Payer: Self-pay | Admitting: General Surgery

## 2014-04-06 ENCOUNTER — Encounter (INDEPENDENT_AMBULATORY_CARE_PROVIDER_SITE_OTHER): Payer: Self-pay | Admitting: General Surgery

## 2014-04-06 ENCOUNTER — Inpatient Hospital Stay (HOSPITAL_COMMUNITY): Payer: BLUE CROSS/BLUE SHIELD

## 2014-04-06 ENCOUNTER — Encounter (HOSPITAL_COMMUNITY): Payer: Self-pay | Admitting: *Deleted

## 2014-04-06 DIAGNOSIS — R109 Unspecified abdominal pain: Secondary | ICD-10-CM

## 2014-04-06 DIAGNOSIS — R1013 Epigastric pain: Principal | ICD-10-CM | POA: Diagnosis present

## 2014-04-06 DIAGNOSIS — G9340 Encephalopathy, unspecified: Secondary | ICD-10-CM

## 2014-04-06 DIAGNOSIS — R1012 Left upper quadrant pain: Secondary | ICD-10-CM | POA: Diagnosis not present

## 2014-04-06 DIAGNOSIS — F329 Major depressive disorder, single episode, unspecified: Secondary | ICD-10-CM | POA: Diagnosis present

## 2014-04-06 DIAGNOSIS — K5900 Constipation, unspecified: Secondary | ICD-10-CM | POA: Diagnosis not present

## 2014-04-06 DIAGNOSIS — K289 Gastrojejunal ulcer, unspecified as acute or chronic, without hemorrhage or perforation: Secondary | ICD-10-CM | POA: Diagnosis present

## 2014-04-06 DIAGNOSIS — F1921 Other psychoactive substance dependence, in remission: Secondary | ICD-10-CM | POA: Diagnosis present

## 2014-04-06 DIAGNOSIS — F419 Anxiety disorder, unspecified: Secondary | ICD-10-CM | POA: Diagnosis present

## 2014-04-06 DIAGNOSIS — R110 Nausea: Secondary | ICD-10-CM | POA: Diagnosis not present

## 2014-04-06 DIAGNOSIS — Z888 Allergy status to other drugs, medicaments and biological substances status: Secondary | ICD-10-CM

## 2014-04-06 DIAGNOSIS — Z9884 Bariatric surgery status: Secondary | ICD-10-CM

## 2014-04-06 LAB — CBC WITH DIFFERENTIAL/PLATELET
Basophils Absolute: 0 10*3/uL (ref 0.0–0.1)
Basophils Relative: 0 % (ref 0–1)
EOS ABS: 0.1 10*3/uL (ref 0.0–0.7)
Eosinophils Relative: 1 % (ref 0–5)
HEMATOCRIT: 39.6 % (ref 36.0–46.0)
Hemoglobin: 12.9 g/dL (ref 12.0–15.0)
Lymphocytes Relative: 39 % (ref 12–46)
Lymphs Abs: 3.3 10*3/uL (ref 0.7–4.0)
MCH: 27.9 pg (ref 26.0–34.0)
MCHC: 32.6 g/dL (ref 30.0–36.0)
MCV: 85.7 fL (ref 78.0–100.0)
Monocytes Absolute: 0.4 10*3/uL (ref 0.1–1.0)
Monocytes Relative: 5 % (ref 3–12)
NEUTROS PCT: 55 % (ref 43–77)
Neutro Abs: 4.6 10*3/uL (ref 1.7–7.7)
PLATELETS: 206 10*3/uL (ref 150–400)
RBC: 4.62 MIL/uL (ref 3.87–5.11)
RDW: 12.7 % (ref 11.5–15.5)
WBC: 8.5 10*3/uL (ref 4.0–10.5)

## 2014-04-06 LAB — URINALYSIS, ROUTINE W REFLEX MICROSCOPIC
Bilirubin Urine: NEGATIVE
GLUCOSE, UA: NEGATIVE mg/dL
Hgb urine dipstick: NEGATIVE
Ketones, ur: NEGATIVE mg/dL
Leukocytes, UA: NEGATIVE
Nitrite: NEGATIVE
PH: 5.5 (ref 5.0–8.0)
PROTEIN: NEGATIVE mg/dL
SPECIFIC GRAVITY, URINE: 1.014 (ref 1.005–1.030)
Urobilinogen, UA: 0.2 mg/dL (ref 0.0–1.0)

## 2014-04-06 LAB — COMPREHENSIVE METABOLIC PANEL
ALBUMIN: 4.4 g/dL (ref 3.5–5.2)
ALT: 13 U/L (ref 0–35)
AST: 16 U/L (ref 0–37)
Alkaline Phosphatase: 59 U/L (ref 39–117)
Anion gap: 0 — ABNORMAL LOW (ref 5–15)
BUN: 11 mg/dL (ref 6–23)
CALCIUM: 8.6 mg/dL (ref 8.4–10.5)
CO2: 30 mmol/L (ref 19–32)
Chloride: 108 mEq/L (ref 96–112)
Creatinine, Ser: 0.74 mg/dL (ref 0.50–1.10)
GFR calc Af Amer: >90 mL/min (ref >90)
GFR calc non Af Amer: >90 mL/min (ref >90)
Glucose, Bld: 94 mg/dL (ref 70–99)
POTASSIUM: 4.3 mmol/L (ref 3.5–5.1)
Sodium: 138 mmol/L (ref 135–145)
TOTAL PROTEIN: 6.7 g/dL (ref 6.0–8.3)
Total Bilirubin: 0.5 mg/dL (ref 0.3–1.2)

## 2014-04-06 MED ORDER — PROMETHAZINE HCL 25 MG/ML IJ SOLN
12.5000 mg | Freq: Four times a day (QID) | INTRAMUSCULAR | Status: DC | PRN
  Administered 2014-04-06 – 2014-04-10 (×14): 12.5 mg via INTRAVENOUS
  Filled 2014-04-06 (×15): qty 1

## 2014-04-06 MED ORDER — ACETAMINOPHEN 10 MG/ML IV SOLN
1000.0000 mg | Freq: Four times a day (QID) | INTRAVENOUS | Status: AC
  Administered 2014-04-06 – 2014-04-07 (×4): 1000 mg via INTRAVENOUS
  Filled 2014-04-06 (×5): qty 100

## 2014-04-06 MED ORDER — KCL IN DEXTROSE-NACL 20-5-0.9 MEQ/L-%-% IV SOLN
INTRAVENOUS | Status: DC
  Administered 2014-04-06: 1000 mL via INTRAVENOUS
  Administered 2014-04-07 (×2): via INTRAVENOUS
  Administered 2014-04-08: 125 mL/h via INTRAVENOUS
  Administered 2014-04-08 – 2014-04-09 (×3): via INTRAVENOUS
  Administered 2014-04-10: 1000 mL via INTRAVENOUS
  Filled 2014-04-06 (×14): qty 1000

## 2014-04-06 MED ORDER — PANTOPRAZOLE SODIUM 40 MG IV SOLR
40.0000 mg | Freq: Two times a day (BID) | INTRAVENOUS | Status: DC
  Administered 2014-04-06 – 2014-04-10 (×8): 40 mg via INTRAVENOUS
  Filled 2014-04-06 (×9): qty 40

## 2014-04-06 MED ORDER — DIPHENHYDRAMINE HCL 25 MG PO CAPS
25.0000 mg | ORAL_CAPSULE | Freq: Four times a day (QID) | ORAL | Status: DC | PRN
  Administered 2014-04-07: 25 mg via ORAL
  Filled 2014-04-06: qty 1

## 2014-04-06 MED ORDER — ONDANSETRON HCL 4 MG/2ML IJ SOLN: 4.0000 mg | Freq: Four times a day (QID) | INTRAMUSCULAR | Status: DC | PRN

## 2014-04-06 MED ORDER — IOHEXOL 300 MG/ML  SOLN
100.0000 mL | Freq: Once | INTRAMUSCULAR | Status: AC | PRN
  Administered 2014-04-06: 100 mL via INTRAVENOUS

## 2014-04-06 NOTE — H&P (Signed)
[name]. Vird[place] 04/06/2014 3:53 PM Location: [facility] Patient #: [id number] DOB: 16-Jan-1976 Married / Language: Cleophus Molt / Race: White Female History of Present Illness Marland Kitchen T. Emilina Smarr MD; 04/06/2014 4:16 PM) Patient words: abdominal pain.  The patient is a 39 year old female who presents with non-malignant abdominal pain. She comes in due to acute abdominal pain. Over 2 years following laparoscopic Roux-en-Y gastric bypass with excellent weight loss and improvement and comorbidities. She does have a history of a minor marginal ulcer that resolved with medication. History of depression and substance abuse with hospitalization in the past. Yesterday after feeling well in recent months she had the onset over a couple of hours of aching left upper quadrant abdominal pain. The pain is constant with exacerbations and has become fairly severe. She has been nauseated without vomiting. She had diarrhea 2 days ago and yesterday but no bowel movement since onset of the pain. No fever or chills. Other Problems Ventura Sellers, CMA; 04/06/2014 3:54 PM) Back Pain Cholelithiasis Depression Gastric Ulcer Gastroesophageal Reflux Disease Migraine Headache Other disease, cancer, significant illness Sleep Apnea GASTRIC BYPASS STATUS FOR OBESITY (V45.86  Z98.84)  Past Surgical History Ventura Sellers, [place]; 04/06/2014 3:54 PM) Cesarean Section - Multiple Colon Polyp Removal - Colonoscopy Gallbladder Surgery - Laparoscopic Gastric Bypass Tonsillectomy  Diagnostic Studies History Ventura Sellers, [place]; 04/06/2014 3:54 PM) Colonoscopy 1-5 years ago Mammogram within last year Pap Smear 1-5 years ago  Allergies Ventura Sellers, CMA; 04/06/2014 3:54 PM) ACE Inhibitors Atarax *ANTIANXIETY AGENTS* Cephalexin *CEPHALOSPORINS* Codeine Phosphate *ANALGESICS - OPIOID* Compazine *ANTIPSYCHOTICS/ANTIMANIC AGENTS* Decadron *CORTICOSTEROIDS* Demerol  *ANALGESICS - OPIOID* Metoclopramide HCl *GASTROINTESTINAL AGENTS - MISC.* Morphine Sulfate (Concentrate) *ANALGESICS - OPIOID* Penicillamine *ASSORTED CLASSES* PROzac *ANTIDEPRESSANTS* Zanaflex *MUSCULOSKELETAL THERAPY AGENTS* Zofran *ANTIEMETICS* Dihydroergotamine Mesylate *MIGRAINE PRODUCTS* Periactin *ANTIHISTAMINES*  Medication History Ventura Sellers, CMA; 04/06/2014 3:54 PM) Azithromycin (250MG  Tablet, Oral daily) Active. EpiPen 2-Pak (0.3MG /0.3ML Soln Auto-inj, Injection as needed) Active. Ketoconazole (2% Shampoo, External as needed) Active. Neomycin-Polymyxin-HC (3.5-10000-1 Suspension, Otic daily) Active.  Social History Ventura Sellers, [place]; 04/06/2014 3:54 PM) Alcohol use Occasional alcohol use. Caffeine use Coffee, Tea. No drug use Tobacco use Never smoker.  Family History Ventura Sellers, [place]; 04/06/2014 3:54 PM) Arthritis Family Members In General. Cancer Family Members In General. Cerebrovascular Accident Family Members In General. Cervical Cancer Family Members In [place] Members In General. Colon Polyps Family Members In General. Depression Brother, Family Members In General, Father. Diabetes Mellitus Family Members In General, Mother. Ischemic Bowel Disease Brother, Family Members In General, Father. Melanoma Family Members In General. Migraine Headache Daughter, Family Members In General, Mother. Ovarian Cancer Family Members In General. Prostate Cancer Family Members In General. Rectal Cancer Family Members In General. Thyroid problems Mother.  Pregnancy / Birth History Ventura Sellers, [place]; 04/06/2014 3:54 PM) Age at menarche 71 years. Contraceptive History Intrauterine device. Gravida 3 Irregular periods Maternal age 51-25 Para 44    Vitals Sharyn Lull R. Brooks CMA; 04/06/2014 3:53 PM) 04/06/2014 3:53 PM Weight: 175.13 lb Height: 67in Body Surface Area: 1.94 m Body Mass  Index: 27.43 kg/m BP: 114/70 (Sitting, Left Arm, Standard)     Physical Exam Marland Kitchen T. Margan Elias MD; 04/06/2014 4:19 PM)  General Note: Alert and cooperative, mild distress   Abdomen Note: Moderate left upper quadrant tenderness without guarding. No palpable mass or fullness. Remainder of her abdomen is nontender.     Assessment & Plan Marland Kitchen T. Latarsha Zani MD; 04/06/2014 4:20 PM)  ABDOMINAL PAIN,  ACUTE, LEFT UPPER QUADRANT (789.02  R10.12) Impression: Status post gastric bypass. Rule out internal hernia. Has history of marginal ulcer but pain is atypical for this. Patient will be direct admitted and obtain lab work and CT scan and follow-up later this evening.

## 2014-04-07 MED ORDER — SUCRALFATE 1 GM/10ML PO SUSP
1.0000 g | Freq: Three times a day (TID) | ORAL | Status: DC
  Administered 2014-04-07 – 2014-04-10 (×11): 1 g via ORAL
  Filled 2014-04-07 (×16): qty 10

## 2014-04-07 MED ORDER — FLEET ENEMA 7-19 GM/118ML RE ENEM
1.0000 | ENEMA | Freq: Once | RECTAL | Status: AC
  Administered 2014-04-07: 1 via RECTAL
  Filled 2014-04-07: qty 1

## 2014-04-07 MED ORDER — ACETAMINOPHEN 325 MG PO TABS
650.0000 mg | ORAL_TABLET | Freq: Four times a day (QID) | ORAL | Status: DC | PRN
  Administered 2014-04-08 – 2014-04-09 (×3): 650 mg via ORAL
  Filled 2014-04-07 (×3): qty 2

## 2014-04-07 NOTE — Progress Notes (Signed)
Patient ID: [name], female   DOB: 1975-09-05, 39 y.o.   MRN: [id number] [facility] Surgery Progress Note:   * No surgery found *  Subjective: Mental status is clear.  Still with midepigastric pain.   Objective: Vital signs in last 24 hours: Temp:  [98 F (36.7 C)-98.8 F (37.1 C)] 98 F (36.7 C) (01/16 0600) Pulse Rate:  [63-70] 63 (01/16 0600) Resp:  [16-18] 18 (01/16 0600) BP: (97-117)/(57-69) 109/57 mmHg (01/16 0600) SpO2:  [97 %-100 %] 99 % (01/16 0600) Weight:  [175 lb (79.379 kg)] 175 lb (79.379 kg) (01/16 0315)  Intake/Output from previous day: 01/15 0701 - 01/16 0700 In: 1327.1 [I.V.:1127.1; IV Piggyback:200] Out: 300 [Urine:300] Intake/Output this shift:    Physical Exam: Work of breathing is normal.  No acute abdomen.    Lab Results:  Results for orders placed or performed during the hospital encounter of 04/06/14 (from the past 48 hour(s))  Urinalysis with microscopic     Status: None   Collection Time: 04/06/14  7:21 PM  Result Value Ref Range   Color, Urine YELLOW YELLOW   APPearance CLEAR CLEAR   Specific Gravity, Urine 1.014 1.005 - 1.030   pH 5.5 5.0 - 8.0   Glucose, UA NEGATIVE NEGATIVE mg/dL   Hgb urine dipstick NEGATIVE NEGATIVE   Bilirubin Urine NEGATIVE NEGATIVE   Ketones, ur NEGATIVE NEGATIVE mg/dL   Protein, ur NEGATIVE NEGATIVE mg/dL   Urobilinogen, UA 0.2 0.0 - 1.0 mg/dL   Nitrite NEGATIVE NEGATIVE   Leukocytes, UA NEGATIVE NEGATIVE    Comment: MICROSCOPIC NOT DONE ON URINES WITH NEGATIVE PROTEIN, BLOOD, LEUKOCYTES, NITRITE, OR GLUCOSE <1000 mg/dL.  CBC WITH DIFFERENTIAL     Status: None   Collection Time: 04/06/14  7:35 PM  Result Value Ref Range   WBC 8.5 4.0 - 10.5 K/uL   RBC 4.62 3.87 - 5.11 MIL/uL   Hemoglobin 12.9 12.0 - 15.0 g/dL   HCT 39.6 36.0 - 46.0 %   MCV 85.7 78.0 - 100.0 fL   MCH 27.9 26.0 - 34.0 pg   MCHC 32.6 30.0 - 36.0 g/dL   RDW 12.7 11.5 - 15.5 %   Platelets 206 150 - 400 K/uL   Neutrophils Relative % 55  43 - 77 %   Neutro Abs 4.6 1.7 - 7.7 K/uL   Lymphocytes Relative 39 12 - 46 %   Lymphs Abs 3.3 0.7 - 4.0 K/uL   Monocytes Relative 5 3 - 12 %   Monocytes Absolute 0.4 0.1 - 1.0 K/uL   Eosinophils Relative 1 0 - 5 %   Eosinophils Absolute 0.1 0.0 - 0.7 K/uL   Basophils Relative 0 0 - 1 %   Basophils Absolute 0.0 0.0 - 0.1 K/uL  Comprehensive metabolic panel     Status: Abnormal   Collection Time: 04/06/14  7:35 PM  Result Value Ref Range   Sodium 138 135 - 145 mmol/L    Comment: Please note change in reference range.   Potassium 4.3 3.5 - 5.1 mmol/L    Comment: Please note change in reference range.   Chloride 108 96 - 112 mEq/L   CO2 30 19 - 32 mmol/L   Glucose, Bld 94 70 - 99 mg/dL   BUN 11 6 - 23 mg/dL   Creatinine, Ser 0.74 0.50 - 1.10 mg/dL   Calcium 8.6 8.4 - 10.5 mg/dL   Total Protein 6.7 6.0 - 8.3 g/dL   Albumin 4.4 3.5 - 5.2 g/dL   AST  16 0 - 37 U/L   ALT 13 0 - 35 U/L   Alkaline Phosphatase 59 39 - 117 U/L   Total Bilirubin 0.5 0.3 - 1.2 mg/dL   GFR calc non Af Amer >90 >90 mL/min   GFR calc Af Amer >90 >90 mL/min    Comment: (NOTE) The eGFR has been calculated using the CKD EPI equation. This calculation has not been validated in all clinical situations. eGFR's persistently <90 mL/min signify possible Chronic Kidney Disease.    Anion gap 0 (L) 5 - 15    Radiology/Results: Ct Abdomen Pelvis W Contrast  04/06/2014   CLINICAL DATA:  Persistent left upper quadrant pain. Nausea and constipation for 2 days. Post gastric bypass.  EXAM: CT ABDOMEN AND PELVIS WITH CONTRAST  TECHNIQUE: Multidetector CT imaging of the abdomen and pelvis was performed using the standard protocol following bolus administration of intravenous contrast.  CONTRAST:  151mL OMNIPAQUE IOHEXOL 300 MG/ML  SOLN  COMPARISON:  CT 03/30/2014  FINDINGS: The included lung bases are clear.  Patient's is post gastric bypass. There is no air in the excluded gastric remnant, excluded gastric remnant is  decompressed. There are no dilated or thickened bowel loops. Particularly, no wall thickening in the ascending colon. No obstruction. There is moderate stool throughout the entire colon.  Clips in gallbladder fossa postcholecystectomy. No biliary dilatation. Spleen, pancreas, liver, and adrenal glands are normal. Kidneys demonstrate symmetric enhancement without hydronephrosis or perinephric stranding. No free air, free fluid, or intra-abdominal fluid collection. Appendix not definitively identified.  The abdominal aorta is normal in caliber. There is no pathologic retroperitoneal adenopathy. Small mesenteric lymph nodes are unchanged from prior exam.  Within the pelvis the urinary bladder is decompressed. There is an intrauterine device appropriately positioned in the uterus. No adnexal mass. Small amount of free fluid in pelvis is physiologic.  There are no acute or suspicious osseous abnormalities.  IMPRESSION: 1. No acute abnormality or change from prior exam. 2. Post gastric bypass without evident complication. 3. Moderate stool throughout the colon. This may reflect constipation. The previous questioned wall thickening in the right colon is no longer seen.   Electronically Signed   By: Jeb Levering M.D.   On: 04/06/2014 22:10    Anti-infectives: Anti-infectives    None      Assessment/Plan: Problem List: Patient Active Problem List   Diagnosis Date Noted  . Abdominal pain 04/06/2014  . Adjustment disorder with mixed anxiety and depressed mood 04/27/2013  . Generalized anxiety disorder 04/26/2013  . Abdominal pain, left upper quadrant 03/29/2013  . Depression 12/28/2012  . History of prescription drug abuse 12/09/2012  . Acute encephalopathy 10/03/2012  . Other and unspecified postsurgical nonabsorption 05/19/2012  . Follow-up examination, following other surgery 05/19/2012  . Marginal ulcer 04/15/2012  . 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 LUQ abdominal pain  03/12/2012  . Diabetes mellitus 01/19/2012  . OSA on CPAP 01/19/2012  . History of migraine 01/19/2012  . Morbid obesity 08/20/2011  . Chest pain 07/28/2011  . Hemiplegia affecting left nondominant side 07/28/2011  . 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 Conversion disorder 03/10/2011  . Abdominal pain 03/10/2011  . Diarrhea 03/10/2011    Stool in colon on CT-enema last night.  Will treat empirically for marginal ulcer with carafate and protonix.   * No surgery found *    LOS: 1 day   Matt B. Hassell Done, MD, [facility] [phone] beeper [phone]  04/07/2014 8:45 AM

## 2014-04-08 LAB — LIPASE, BLOOD: Lipase: 25 U/L (ref 11–59)

## 2014-04-08 MED ORDER — HYDROXYZINE HCL 50 MG/ML IM SOLN: 50.0000 mg | Freq: Four times a day (QID) | INTRAMUSCULAR | Status: DC | PRN

## 2014-04-08 NOTE — Progress Notes (Signed)
Patient ID: [name], female   DOB: 1975/06/08, 39 y.o.   MRN: [id number] Patient seen on evening rounds.  Epigastric pain is like someone punched her in the stomach.  On Carafate and PPI.  May need endoscopy.  Will check lipase and will order Buprenex for pain.    Matt B. Hassell Done, MD, [facility] [phone] beeper [phone]  04/08/2014 8:55 PM

## 2014-04-08 NOTE — Progress Notes (Signed)
Patient ID: [name], female   DOB: 1976-01-22, 39 y.o.   MRN: [id number] [facility] Surgery Progress Note:   * No surgery found *  Subjective: Mental status is clear.  Pain is better;  Had diarrhea this morning with some cramps Objective: Vital signs in last 24 hours: Temp:  [97.4 F (36.3 C)-98.8 F (37.1 C)] 98.8 F (37.1 C) (01/17 0626) Pulse Rate:  [65-72] 68 (01/17 0626) Resp:  [18] 18 (01/17 0626) BP: (88-98)/(50-74) 98/74 mmHg (01/17 0626) SpO2:  [98 %-100 %] 98 % (01/17 0626)  Intake/Output from previous day: 01/16 0701 - 01/17 0700 In: 3362.1 [P.O.:360; I.V.:2802.1; IV Piggyback:200] Out: 3000 [Urine:3000] Intake/Output this shift: Total I/O In: -  Out: 900 [Urine:900]  Physical Exam: Work of breathing is normal.  Pain better  Lab Results:  Results for orders placed or performed during the hospital encounter of 04/06/14 (from the past 48 hour(s))  Urinalysis with microscopic     Status: None   Collection Time: 04/06/14  7:21 PM  Result Value Ref Range   Color, Urine YELLOW YELLOW   APPearance CLEAR CLEAR   Specific Gravity, Urine 1.014 1.005 - 1.030   pH 5.5 5.0 - 8.0   Glucose, UA NEGATIVE NEGATIVE mg/dL   Hgb urine dipstick NEGATIVE NEGATIVE   Bilirubin Urine NEGATIVE NEGATIVE   Ketones, ur NEGATIVE NEGATIVE mg/dL   Protein, ur NEGATIVE NEGATIVE mg/dL   Urobilinogen, UA 0.2 0.0 - 1.0 mg/dL   Nitrite NEGATIVE NEGATIVE   Leukocytes, UA NEGATIVE NEGATIVE    Comment: MICROSCOPIC NOT DONE ON URINES WITH NEGATIVE PROTEIN, BLOOD, LEUKOCYTES, NITRITE, OR GLUCOSE <1000 mg/dL.  CBC WITH DIFFERENTIAL     Status: None   Collection Time: 04/06/14  7:35 PM  Result Value Ref Range   WBC 8.5 4.0 - 10.5 K/uL   RBC 4.62 3.87 - 5.11 MIL/uL   Hemoglobin 12.9 12.0 - 15.0 g/dL   HCT 39.6 36.0 - 46.0 %   MCV 85.7 78.0 - 100.0 fL   MCH 27.9 26.0 - 34.0 pg   MCHC 32.6 30.0 - 36.0 g/dL   RDW 12.7 11.5 - 15.5 %   Platelets 206 150 - 400 K/uL   Neutrophils Relative %  55 43 - 77 %   Neutro Abs 4.6 1.7 - 7.7 K/uL   Lymphocytes Relative 39 12 - 46 %   Lymphs Abs 3.3 0.7 - 4.0 K/uL   Monocytes Relative 5 3 - 12 %   Monocytes Absolute 0.4 0.1 - 1.0 K/uL   Eosinophils Relative 1 0 - 5 %   Eosinophils Absolute 0.1 0.0 - 0.7 K/uL   Basophils Relative 0 0 - 1 %   Basophils Absolute 0.0 0.0 - 0.1 K/uL  Comprehensive metabolic panel     Status: Abnormal   Collection Time: 04/06/14  7:35 PM  Result Value Ref Range   Sodium 138 135 - 145 mmol/L    Comment: Please note change in reference range.   Potassium 4.3 3.5 - 5.1 mmol/L    Comment: Please note change in reference range.   Chloride 108 96 - 112 mEq/L   CO2 30 19 - 32 mmol/L   Glucose, Bld 94 70 - 99 mg/dL   BUN 11 6 - 23 mg/dL   Creatinine, Ser 0.74 0.50 - 1.10 mg/dL   Calcium 8.6 8.4 - 10.5 mg/dL   Total Protein 6.7 6.0 - 8.3 g/dL   Albumin 4.4 3.5 - 5.2 g/dL   AST 16 0 -  37 U/L   ALT 13 0 - 35 U/L   Alkaline Phosphatase 59 39 - 117 U/L   Total Bilirubin 0.5 0.3 - 1.2 mg/dL   GFR calc non Af Amer >90 >90 mL/min   GFR calc Af Amer >90 >90 mL/min    Comment: (NOTE) The eGFR has been calculated using the CKD EPI equation. This calculation has not been validated in all clinical situations. eGFR's persistently <90 mL/min signify possible Chronic Kidney Disease.    Anion gap 0 (L) 5 - 15    Radiology/Results: Ct Abdomen Pelvis W Contrast  04/06/2014   CLINICAL DATA:  Persistent left upper quadrant pain. Nausea and constipation for 2 days. Post gastric bypass.  EXAM: CT ABDOMEN AND PELVIS WITH CONTRAST  TECHNIQUE: Multidetector CT imaging of the abdomen and pelvis was performed using the standard protocol following bolus administration of intravenous contrast.  CONTRAST:  157m OMNIPAQUE IOHEXOL 300 MG/ML  SOLN  COMPARISON:  CT 03/30/2014  FINDINGS: The included lung bases are clear.  Patient's is post gastric bypass. There is no air in the excluded gastric remnant, excluded gastric remnant is  decompressed. There are no dilated or thickened bowel loops. Particularly, no wall thickening in the ascending colon. No obstruction. There is moderate stool throughout the entire colon.  Clips in gallbladder fossa postcholecystectomy. No biliary dilatation. Spleen, pancreas, liver, and adrenal glands are normal. Kidneys demonstrate symmetric enhancement without hydronephrosis or perinephric stranding. No free air, free fluid, or intra-abdominal fluid collection. Appendix not definitively identified.  The abdominal aorta is normal in caliber. There is no pathologic retroperitoneal adenopathy. Small mesenteric lymph nodes are unchanged from prior exam.  Within the pelvis the urinary bladder is decompressed. There is an intrauterine device appropriately positioned in the uterus. No adnexal mass. Small amount of free fluid in pelvis is physiologic.  There are no acute or suspicious osseous abnormalities.  IMPRESSION: 1. No acute abnormality or change from prior exam. 2. Post gastric bypass without evident complication. 3. Moderate stool throughout the colon. This may reflect constipation. The previous questioned wall thickening in the right colon is no longer seen.   Electronically Signed   By: MJeb LeveringM.D.   On: 04/06/2014 22:10    Anti-infectives: Anti-infectives    None      Assessment/Plan: Problem List: Patient Active Problem List   Diagnosis Date Noted  . Abdominal pain 04/06/2014  . Adjustment disorder with mixed anxiety and depressed mood 04/27/2013  . Generalized anxiety disorder 04/26/2013  . Abdominal pain, left upper quadrant 03/29/2013  . Depression 12/28/2012  . History of prescription drug abuse 12/09/2012  . Acute encephalopathy 10/03/2012  . Other and unspecified postsurgical nonabsorption 05/19/2012  . Follow-up examination, following other surgery 05/19/2012  . Marginal ulcer 04/15/2012  . 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 LUQ abdominal pain  03/12/2012  . Diabetes mellitus 01/19/2012  . OSA on CPAP 01/19/2012  . History of migraine 01/19/2012  . Morbid obesity 08/20/2011  . Chest pain 07/28/2011  . Hemiplegia affecting left nondominant side 07/28/2011  . 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 Conversion disorder 03/10/2011  . Abdominal pain 03/10/2011  . Diarrhea 03/10/2011    Will see how she does with full bariatric diet.  Continue marginal ulcer treatment.   * No surgery found *    LOS: 2 days   Matt B. MHassell Done MD, [facility] [phone] [phone] 04/08/2014 11:04 AM

## 2014-04-08 NOTE — Progress Notes (Signed)
Began having pain approximately 2 hours after eating -Dr Hassell Done called -did not want to change pain meds. Diet put back to clears.

## 2014-04-09 ENCOUNTER — Encounter (HOSPITAL_COMMUNITY)
Admission: AD | Disposition: A | Discharge status: Home / Self Care | Payer: BLUE CROSS/BLUE SHIELD | Source: Ambulatory Visit

## 2014-04-09 HISTORY — PX: ESOPHAGOGASTRODUODENOSCOPY: SHX5428

## 2014-04-09 LAB — BASIC METABOLIC PANEL
Anion gap: 3 — ABNORMAL LOW (ref 5–15)
CHLORIDE: 111 meq/L (ref 96–112)
CO2: 28 mmol/L (ref 19–32)
Calcium: 8.1 mg/dL — ABNORMAL LOW (ref 8.4–10.5)
Creatinine, Ser: 0.69 mg/dL (ref 0.50–1.10)
Glucose, Bld: 99 mg/dL (ref 70–99)
Potassium: 4.1 mmol/L (ref 3.5–5.1)
Sodium: 142 mmol/L (ref 135–145)

## 2014-04-09 LAB — CBC
HCT: 37 % (ref 36.0–46.0)
Hemoglobin: 11.6 g/dL — ABNORMAL LOW (ref 12.0–15.0)
MCH: 27.2 pg (ref 26.0–34.0)
MCHC: 31.4 g/dL (ref 30.0–36.0)
MCV: 86.9 fL (ref 78.0–100.0)
PLATELETS: 172 10*3/uL (ref 150–400)
RBC: 4.26 MIL/uL (ref 3.87–5.11)
RDW: 12.7 % (ref 11.5–15.5)
WBC: 5.8 10*3/uL (ref 4.0–10.5)

## 2014-04-09 SURGERY — EGD (ESOPHAGOGASTRODUODENOSCOPY)
Anesthesia: Moderate Sedation

## 2014-04-09 MED ORDER — FENTANYL CITRATE 0.05 MG/ML IJ SOLN
INTRAMUSCULAR | Status: AC
  Filled 2014-04-09: qty 2

## 2014-04-09 MED ORDER — MIDAZOLAM HCL 10 MG/2ML IJ SOLN
INTRAMUSCULAR | Status: AC
  Filled 2014-04-09: qty 2

## 2014-04-09 MED ORDER — MIDAZOLAM HCL 10 MG/2ML IJ SOLN
INTRAMUSCULAR | Status: DC | PRN
  Administered 2014-04-09 (×3): 2 mg via INTRAVENOUS

## 2014-04-09 MED ORDER — FENTANYL CITRATE 0.05 MG/ML IJ SOLN
INTRAMUSCULAR | Status: DC | PRN
  Administered 2014-04-09 (×3): 25 ug via INTRAVENOUS

## 2014-04-09 MED ORDER — MENTHOL 3 MG MT LOZG
1.0000 | LOZENGE | OROMUCOSAL | Status: DC | PRN
  Administered 2014-04-09: 3 mg via ORAL
  Filled 2014-04-09: qty 9

## 2014-04-09 MED ORDER — HYDROMORPHONE HCL 1 MG/ML IJ SOLN: 1.0000 mg | Freq: Once | INTRAMUSCULAR | Status: DC

## 2014-04-09 MED ORDER — BUTAMBEN-TETRACAINE-BENZOCAINE 2-2-14 % EX AERO
INHALATION_SPRAY | CUTANEOUS | Status: DC | PRN
Start: 2014-04-09 — End: 2014-04-09
  Administered 2014-04-09: 3 via TOPICAL

## 2014-04-09 NOTE — Progress Notes (Signed)
MD placed an order for a one time dose of Dilaudid 1mg . Patient's husband has great concern about her taking the medication secondary to her history of substance abuse. He is refusing for nursing to give the patient the IV Dilaudid. Patient reports that MD was to be looking into ordering Buprenex IV. Dr. Excell Seltzer paged for clarification as to how nursing should proceed. Awaiting new orders.  Husband was updated as to the plan of care for the patient. Patient is scheduled to be going to Endo at 1pm this afternoon. Husband also updated as to the medications that the patient is receiving at this time. Patient gave consent this morning to give updates to her spouse.

## 2014-04-09 NOTE — Progress Notes (Signed)
Chart reviewed.  Patient with persistent abdominal pain post RYGB on 11/30/2011.  I last did an upper endo 04/25/2012.  A marginal ulcer had just about healed.  It looks like she had a upper endo by Dr. Paulita Fujita in 11/2012 which was negative.  Will do upper endo later today.  The patient understands the plan and the procedure.  Alphonsa Overall, MD, [facility] Surgery Pager: [phone] Office phone:  [phone]

## 2014-04-09 NOTE — Progress Notes (Signed)
  Subjective: Pale chronically ill appearing woman.  Complains of pain ongoing LUQ, says it was worse with bariatric diet she got last PM.  She has no distension, no nausea or vomiting, she had 2 loose stools last pM, she denies issues with constipation.    Objective: Vital signs in last 24 hours: Temp:  [98 F (36.7 C)-98.7 F (37.1 C)] 98.2 F (36.8 C) (01/18 0546) Pulse Rate:  [67-88] 67 (01/18 0546) Resp:  [16-18] 16 (01/18 0546) BP: (85-98)/(51-65) 95/51 mmHg (01/18 0546) SpO2:  [98 %-100 %] 100 % (01/18 0546) Last BM Date: 04/07/14 No NG drainage/emesis Stools x 2 recorded last PM Back to clear liquids  Last PM with pain after eating Afebrile, BP in the 80's to 90's range. No labs Admit CT 04/06/14:  CT was normal, with moderate stool in colon ? Constipation. Intake/Output from previous day: 01/17 0701 - 01/18 0700 In: 3000 [I.V.:3000] Out: 4750 [Urine:4750] Intake/Output this shift:    General appearance: alert, cooperative and no distress GI: soft complains of pain in the LUQ, but she has no distension, no peritonitis, abdomen is soft and not tender to palpation.  Lab Results:   Recent Labs  04/06/14 1935  WBC 8.5  HGB 12.9  HCT 39.6  PLT 206    BMET  Recent Labs  04/06/14 1935  NA 138  K 4.3  CL 108  CO2 30  GLUCOSE 94  BUN 11  CREATININE 0.74  CALCIUM 8.6   PT/INR No results for input(s): LABPROT, INR in the last 72 hours.   Recent Labs Lab 04/06/14 1935  AST 16  ALT 13  ALKPHOS 59  BILITOT 0.5  PROT 6.7  ALBUMIN 4.4     Lipase     Component Value Date/Time   LIPASE 25 04/08/2014 2130     Studies/Results: No results found.  Medications: . pantoprazole (PROTONIX) IV  40 mg Intravenous Q12H  . sucralfate  1 g Oral TID WC & HS    Assessment/Plan Abdominal pain LUQ - Atypical Prior Roux en Y, hx of cholecystectomy Hx of marginal ulcer Hx of depression and substance abuse (off pain meds in May 2015, completed rehab in  August 2015) Hx of Migraine Hx of sleep apnea Multiple medicine allergies (15 listed)  SCD's for DVT prophylaxis   Plan:  I will leave her on liquids, I do not see a good reason to repeat her CT or even plain films.  I will check with Dr. Marcello Moores and see if we need to get an endoscopy and see if she has an ulcer.  She is already on PPI and Carafate.  Recheck labs.      LOS: 3 days    Noemi Bellissimo 04/09/2014

## 2014-04-09 NOTE — Op Note (Signed)
04/06/2014 - 04/09/2014  3:35 PM  PATIENT:  [name], 39 y.o., female, MRN: [id number]  PREOP DIAGNOSIS:  Epigastric pain  POSTOP DIAGNOSIS:   Epigastric pain, etiology unclear, no evidence of ulcer  PROCEDURE:  Esophagogastrojejunoscopy  SURGEON:   Alphonsa Overall, M.D.  ANESTHESIA:   Fentanyl  75 mcg   Versed 6 mg  INDICATIONS FOR PROCEDURE:  [name] is a 39 y.o. (DOB: 1975-10-11)  white  female whose primary care physician is Anthoney Harada, MD and comes for upper endoscopy to evaluate epigastric abdominal pain.  The patient had a RYGB on 11/30/2011 by Dr. Excell Seltzer.  I did an endoscopy in 03/15/2015 which showed 2 marginal ulcers.  I repeated the endo in 04/25/2012 which showed that the ulcers had almost healed.  She had an upper endo by Dr. Paulita Fujita in 11/25/2012 which showed no ulcer   The indications and risks of the endoscopy were explained to the patient.  The risks include, but are not limited to, perforation, bleeding, or injury to the bowel.  If balloon dilatation is needed, the risk of perforation is higher.  PROCEDURE:  The patient was in room 4 at [facility] endoscopy unit.  The patient was monitored with a pulse oximetry, BP cuff, and EKG.  The patient has nasal O2 flowing during the procedure.   The back of the throat was anesthestized with Ceticaine x 3.  The patient was positioned in the left lateral decubitus position.  The patient was given Fentanyl and Versed.  A flexible Pentax endoscope was passed down the throat without difficulty.   Findings include:   Esophagus:   Normal   GE junction at:  40 cm cm   Stomach pouch: Normal post RYGB   Gastrojejunal anastomosis:   44 cm  It is widely patent and I saw no evidence of ulcer disease.   Efferent jejunal limb:  Normal.  I went down 20+ cm and saw no lesion. Afferent jejunal limb:  Normal   CLO test:  Obtained, results pending.  PLAN:   Photos taken and given to patient.   Note - there is new Research officer, political party for  the pictures and it messed up the labeling of one picture   The patient is in the hospital with epigastric pain.  Will discuss findings with Dr. Excell Seltzer.  Alphonsa Overall, MD, [facility] Pager: [phone] Office phone:  [phone]

## 2014-04-09 NOTE — Progress Notes (Signed)
Patient complaining of pain 9/10 to LUQ. Reports that it feels like "someone punched me in the stomach." Will Circle, [facility] notified of patient's pain. He reported to continue giving her Tylenol for pain control. Patient reported sudden, increase in pain after eating clear liquids for breakfast. She reports that nothing is taking care of her pain. She is requesting Endoscopy to be done and a one time dose of Dilaudid.

## 2014-04-10 ENCOUNTER — Encounter (HOSPITAL_COMMUNITY): Payer: Self-pay | Admitting: Surgery

## 2014-04-10 LAB — CLOTEST (H. PYLORI), BIOPSY: Helicobacter screen: NEGATIVE

## 2014-04-10 MED ORDER — PANTOPRAZOLE SODIUM 40 MG PO TBEC: 40.0000 mg | DELAYED_RELEASE_TABLET | Freq: Every day | ORAL | Status: DC

## 2014-04-10 MED ORDER — SUCRALFATE 1 GM/10ML PO SUSP: 1.0000 g | Freq: Three times a day (TID) | ORAL | Status: DC

## 2014-04-10 NOTE — Discharge Summary (Signed)
   Patient ID: [name] [id number] 38 y.o. [birth year]  04/06/2014  Discharge date and time: 04/10/2014   Admitting Physician: Excell Seltzer T  Discharge Physician: Excell Seltzer T  Admission Diagnoses: Abd pain, post gastric bypass abdominal pain  Discharge Diagnoses: Same  Operations: Procedure(s): ESOPHAGOGASTRODUODENOSCOPY (EGD)  Admission Condition: fair  Discharged Condition: good  Indication for Admission: Patient is a 39 year old female proximally 2 years following successful Roux-en-Y gastric bypass. She has a history of remote marginal ulcer. History of depression and narcotic dependence. She presents with acute epigastric abdominal pain postprandially for 24-36 hours. Admitted for further treatment and evaluation.  Hospital Course: Patient was admitted and made nothing by mouth and started on IV fluids. Nonnarcotic pain medications were prescribed due to her history of dependence. Lab work was unremarkable. CT scan of the abdomen and pelvis was obtained which showed no evidence of bowel obstruction or other abnormality. Her pain continued and she underwent upper GI endoscopy which was normal. The day following the study she is somewhat improved. Still having some pain plus liquids but less than admission. Options were discussed including observation versus proceeding with diagnostic laparoscopy. With her particular history and as she is improving we elected to observe closely and follow-up in the office and she is discharged at this time.  Significant Diagnostic Studies: radiology: CT scan: negative    EGD negative  Treatments: IV hydration  Disposition: Home  Patient Instructions:    Medication List    TAKE these medications        BOTOX IJ  Inject 1 each as directed every 3 (three) months. For migraines     CALCIUM-VITAMIN D PO  Take 1 tablet by mouth 3 (three) times daily.     EPINEPHrine 0.3 mg/0.3 mL Soaj injection  Commonly known as:   EPI-PEN  Inject 0.3 mg into the muscle daily as needed (allergic reaction).     ferrous sulfate 325 (65 FE) MG tablet  Take 325 mg by mouth daily with breakfast.     LORazepam 0.5 MG tablet  Commonly known as:  ATIVAN  Take 2 tablets (1 mg total) by mouth 2 (two) times daily.     multivitamin with minerals Tabs tablet  Take 1 tablet by mouth 2 (two) times daily.     pantoprazole 40 MG tablet  Commonly known as:  PROTONIX  Take 1 tablet (40 mg total) by mouth daily.     sucralfate 1 GM/10ML suspension  Commonly known as:  CARAFATE  Take 10 mLs (1 g total) by mouth 4 (four) times daily -  with meals and at bedtime.     traZODone 100 MG tablet  Commonly known as:  DESYREL  Take 2 tablets (200 mg total) by mouth at bedtime.     VITAMIN B-12 PO  Take 1 tablet by mouth once a week.        Activity: activity as tolerated Diet: post gastric bypass Wound Care: none needed  Follow-up:  With Dr. Excell Seltzer in 2 weeks.  Signed: Edward Jolly MD, FACS  04/10/2014, 9:49 AM

## 2014-04-10 NOTE — Discharge Instructions (Signed)
Bland diet. Initially concentrate on liquids and staying hydrated No activity limitations

## 2014-04-10 NOTE — Progress Notes (Signed)
Pt being discharged with husband. IV dc'd. Discharge teaching reviewed and pt verbalized understanding. No further questions or concerns at this time.

## 2014-04-10 NOTE — Progress Notes (Signed)
Patient ID: [name], female   DOB: September 11, 1975, 39 y.o.   MRN: [id number] 1 Day Post-Op  Subjective: She feels somewhat better today. Still some pain after liquids but tolerating these better. Appears much more comfortable than yesterday morning.  Objective: Vital signs in last 24 hours: Temp:  [97.8 F (36.6 C)-98.2 F (36.8 C)] 98.2 F (36.8 C) (01/19 0602) Pulse Rate:  [63-90] 63 (01/19 0602) Resp:  [14-26] 16 (01/19 0602) BP: (86-133)/(50-71) 96/60 mmHg (01/19 0725) SpO2:  [100 %] 100 % (01/19 0602) Weight:  [175 lb (79.379 kg)] 175 lb (79.379 kg) (01/18 1336) Last BM Date: 04/09/14  Intake/Output from previous day: 01/18 0701 - 01/19 0700 In: 1654.2 [P.O.:240; I.V.:1414.2] Out: 3900 [Urine:3900] Intake/Output this shift:    General appearance: alert, cooperative and no distress GI: mild epigastric tenderness without guarding or fullness  Lab Results:   Recent Labs  04/09/14 0945  WBC 5.8  HGB 11.6*  HCT 37.0  PLT 172   BMET  Recent Labs  04/09/14 0945  NA 142  K 4.1  CL 111  CO2 28  GLUCOSE 99  BUN <5*  CREATININE 0.69  CALCIUM 8.1*   Upper endoscopy yesterday was negative for ulcer, inflammation or stricture  Studies/Results: No results found.  Anti-infectives: Anti-infectives    None      Assessment/Plan: s/p Procedure(s): ESOPHAGOGASTRODUODENOSCOPY (EGD) Abdominal pain post bypass. Complex situation with history of narcotic dependence and psychosomatic complaints in the past. Negative workup to date. She is improved but still having some pain. Discussed situation and possible plans with the patient and her husband. Next step in workup would be diagnostic laparoscopy. I think this is unlikely to be revealing. Since she is improving we decided to let her go home on Protonix and Carafate with close follow-up in the office.   LOS: 4 days    [name] T 04/10/2014

## 2014-04-25 DIAGNOSIS — Z9884 Bariatric surgery status: Secondary | ICD-10-CM | POA: Diagnosis not present

## 2014-04-25 DIAGNOSIS — R1012 Left upper quadrant pain: Secondary | ICD-10-CM | POA: Diagnosis not present

## 2014-05-17 DIAGNOSIS — G43709 Chronic migraine without aura, not intractable, without status migrainosus: Secondary | ICD-10-CM | POA: Diagnosis not present

## 2014-06-12 ENCOUNTER — Other Ambulatory Visit: Payer: Self-pay | Admitting: Family Medicine

## 2014-06-12 DIAGNOSIS — L729 Follicular cyst of the skin and subcutaneous tissue, unspecified: Secondary | ICD-10-CM

## 2014-06-12 DIAGNOSIS — S39012A Strain of muscle, fascia and tendon of lower back, initial encounter: Secondary | ICD-10-CM | POA: Diagnosis not present

## 2014-06-14 DIAGNOSIS — Z9884 Bariatric surgery status: Secondary | ICD-10-CM | POA: Diagnosis not present

## 2014-06-18 ENCOUNTER — Ambulatory Visit
Admission: RE | Admit: 2014-06-18 | Discharge: 2014-06-18 | Disposition: A | Discharge status: Home / Self Care | Payer: BLUE CROSS/BLUE SHIELD | Source: Ambulatory Visit | Attending: Family Medicine | Admitting: Family Medicine

## 2014-06-18 ENCOUNTER — Other Ambulatory Visit: Payer: Self-pay | Admitting: Family Medicine

## 2014-06-18 DIAGNOSIS — L729 Follicular cyst of the skin and subcutaneous tissue, unspecified: Secondary | ICD-10-CM

## 2014-06-25 ENCOUNTER — Other Ambulatory Visit: Payer: Self-pay | Admitting: Obstetrics and Gynecology

## 2014-06-25 DIAGNOSIS — Z6828 Body mass index (BMI) 28.0-28.9, adult: Secondary | ICD-10-CM | POA: Diagnosis not present

## 2014-06-25 DIAGNOSIS — Z01419 Encounter for gynecological examination (general) (routine) without abnormal findings: Secondary | ICD-10-CM | POA: Diagnosis not present

## 2014-06-26 LAB — CYTOLOGY - PAP

## 2014-06-29 ENCOUNTER — Encounter: Payer: Self-pay | Admitting: Physical Therapy

## 2014-06-29 ENCOUNTER — Ambulatory Visit: Payer: BLUE CROSS/BLUE SHIELD | Attending: Family Medicine | Admitting: Physical Therapy

## 2014-06-29 DIAGNOSIS — M533 Sacrococcygeal disorders, not elsewhere classified: Secondary | ICD-10-CM | POA: Diagnosis not present

## 2014-06-29 DIAGNOSIS — M545 Low back pain: Secondary | ICD-10-CM | POA: Diagnosis not present

## 2014-06-29 DIAGNOSIS — M799 Soft tissue disorder, unspecified: Secondary | ICD-10-CM | POA: Diagnosis not present

## 2014-06-29 NOTE — Therapy (Signed)
[facility] [address], [place], [place], [address] Phone: [phone]   Fax:  [phone]  Physical Therapy Evaluation  Patient Details  Name: [name] MRN: [id number] Date of Birth: [birth year] Referring Provider:  Vernie Shanks, MD  Encounter Date: 06/29/2014      PT End of Session - 06/29/14 0934    Visit Number 1   Date for PT Re-Evaluation 08/10/14   PT Start Time 0900   PT Stop Time 1015   PT Time Calculation (min) 75 min   Activity Tolerance Patient tolerated treatment well   Behavior During Therapy [facility] for tasks assessed/performed      Past Medical History  Diagnosis Date  . Depression   . Anxiety   . Diabetes mellitus   . GERD (gastroesophageal reflux disease)   . ADD (attention deficit disorder)   . Hyperlipemia   . Herniated disc   . Allergy     Seasonal  . Diabetic neuropathy   . Shortness of breath     with exertion   . Sleep apnea     obsructive settings at 13   . Heart murmur     asa child  . Seizures     hx of / month ago - dehydration   . 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 Degenerative disc disease   . Annular disc tear   . Retrolisthesis     L4 and L5  . Bulging disc   . Chronic pain 04/20/13    Medication prescribed by Vira Blanco, M.D.    Past Surgical History  Procedure Laterality Date  . Cholecystectomy    . Cesarean section    . Tonsillectomy    . Laporoscopy  2007  . Breath tek h pylori  09/11/2011    Procedure: BREATH TEK H PYLORI;  Surgeon: Edward Jolly, MD;  Location: [facility];  Service: General;  Laterality: N/A;  . 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 Bowel resection  11/30/2011    Procedure: SMALL BOWEL RESECTION;  Surgeon: Edward Jolly, MD;  Location: [facility];  Service: General;;  . Esophagogastroduodenoscopy  03/14/2012    Procedure: ESOPHAGOGASTRODUODENOSCOPY (EGD);  Surgeon: Shann Medal, MD;  Location: [facility];  Service: General;  Laterality: N/A;  . Esophagogastroduodenoscopy  04/25/2012    Procedure: ESOPHAGOGASTRODUODENOSCOPY (EGD);  Surgeon: Shann Medal, MD;  Location: [facility];  Service: General;  Laterality: N/A;  . Esophagogastroduodenoscopy N/A 03/31/2013    Procedure: ESOPHAGOGASTRODUODENOSCOPY (EGD);  Surgeon: Shann Medal, MD;  Location: [facility];  Service: General;  Laterality: N/A;  . Esophagogastroduodenoscopy N/A 04/09/2014    Procedure: ESOPHAGOGASTRODUODENOSCOPY (EGD);  Surgeon: Alphonsa Overall, MD;  Location: [facility];  Service: General;  Laterality: N/A;    There were no vitals filed for this visit.  Visit Diagnosis:  Left low back pain, with sciatica presence unspecified - Plan: PT plan of care cert/re-cert      Subjective Assessment - 06/29/14 0909    Subjective Patient reports she has a lump on her spine that hurt.  She packed up books at a book sale and pulled her back on 06/05/2014.  Patient had tingling in left leg with sitting.     Limitations Sitting;Standing;Walking   How long can  you sit comfortably? 30 min. tingling in left leg.    How long can you stand comfortably? 20 min then tingling in left leg.    How long can you walk comfortably? 30-40 min then has a nagging back pain.   Diagnostic tests ultrasound on lump where they found a solid mass.     Patient Stated Goals Household without pain, return to the gym   Currently in Pain? Yes   Pain Score 6    Pain Location Back   Pain Orientation Right;Left   Pain Descriptors / Indicators Stabbing   Pain Type Acute pain   Pain Radiating Towards down left leg   Pain Onset 1 to 4 weeks ago   Pain Frequency Constant   Aggravating Factors  lifting, going up and down stairs, bending over   Pain Relieving Factors coloring books or hot baths    Effect of Pain on Daily Activities pain with all activities   Multiple Pain Sites No            [facility] PT Assessment - 06/29/14 0001    Assessment   Medical Diagnosis Back pain   Onset Date 06/05/14   Next MD Visit 06/29/2014   Prior Therapy None   Precautions   Precautions None   Balance Screen   Has the patient fallen in the past 6 months No   Has the patient had a decrease in activity level because of a fear of falling?  No   Is the patient reluctant to leave their home because of a fear of falling?  No   Home Environment   Living Enviornment Private residence   Prior Function   Level of Independence Independent with basic ADLs   Vocation Works at home   [employer] household duties   Leisure gym   Observation/Other Assessments   Focus on Therapeutic Outcomes (FOTO)  46% limitation   AROM   Lumbar Flexion decreased by 50%   Lumbar Extension decreased by 75%   Lumbar - Right Side Bend decreased by 75%   Lumbar - Left Side Bend decreased by 75%   Palpation   Palpation left ilium is posteriorly rotated, right PSIS is shallow, Tenderness located in left piriformis, left side of L4 and L5, Decreased  mobility    Pelvic Compression   Findings Positive   Side Left                   [facility] Adult PT Treatment/Exercise - 06/29/14 0001    Manual Therapy   Manual Therapy Joint mobilization   Joint Mobilization muscle energy technique to correct left psot. rotated ilium                 PT Education - 06/29/14 0959    Education provided Yes   Education Details flexibilty exercises and body mechanics   Person(s) Educated Patient   Methods Explanation;Demonstration;Handout;Verbal cues;Tactile cues   Comprehension Verbalized understanding;Returned demonstration          PT Short Term Goals - 06/29/14 1004    PT SHORT TERM GOAL #1   Title understand correct body mechanics with home tasks   Time 3   Period Weeks   Status New   PT SHORT TERM  GOAL #2   Title pain with sitting decreased >/= 25%   Time 3   Period Weeks   Status New   PT SHORT TERM GOAL #3   Title pain with home activities decreased >/= 25%   Time  3   Period Weeks   Status New   PT SHORT TERM GOAL #4   Title pain with sleeping decreased >/= 25%   Time 3   Period Weeks   Status New           PT Long Term Goals - 06/29/14 1005    PT LONG TERM GOAL #1   Title return to gym program at the [facility] with correct body mechanics   Time 6   Period Weeks   Status New   PT LONG TERM GOAL #2   Title sit with pain decreased >/= 75%   Time 6   Period Weeks   Status New   PT LONG TERM GOAL #3   Title perform daily activities with pain decreased >/= 75%   Time 6   Period Weeks   Status New   PT LONG TERM GOAL #4   Title sleep with pain decreased >/= 75%   Time 6   Period Weeks   Status New               Plan - 06/29/14 1000    Clinical Impression Statement Patient is a 39 year old female with lumbar pain that radiated into the left leg.  Patient has a solid mass over the L3-L5 vertebrae that is monitiored by MD.  Patient has decreased lumbar ROM with pain. Patient  left ilium is rotated posteriorly.    Pt will benefit from skilled therapeutic intervention in order to improve on the following deficits Decreased range of motion;Difficulty walking;Decreased endurance;Increased fascial restricitons;Decreased activity tolerance;Increased muscle spasms;Pain;Decreased strength;Decreased mobility   Rehab Potential Excellent   PT Frequency 2x / week   PT Duration 6 weeks   PT Treatment/Interventions Moist Heat;Therapeutic activities;Patient/family education;Therapeutic exercise;Manual techniques;Cryotherapy;Neuromuscular re-education;Electrical Stimulation   PT Next Visit Plan See what MD says about her back, soft tissue work to left buttocks and back, spinal stabilization in neutral   PT Home Exercise Plan Body mechanics with home tasks   Recommended Other  Services None   Consulted and Agree with Plan of Care Patient         Problem List Patient Active Problem List   Diagnosis Date Noted  . Abdominal pain 04/06/2014  . Adjustment disorder with mixed anxiety and depressed mood 04/27/2013  . Generalized anxiety disorder 04/26/2013  . Abdominal pain, left upper quadrant 03/29/2013  . Depression 12/28/2012  . History of prescription drug abuse 12/09/2012  . Acute encephalopathy 10/03/2012  . Other and unspecified postsurgical nonabsorption 05/19/2012  . Follow-up examination, following other surgery 05/19/2012  . Marginal ulcer 04/15/2012  . 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 LUQ abdominal pain 03/12/2012  . Diabetes mellitus 01/19/2012  . OSA on CPAP 01/19/2012  . History of migraine 01/19/2012  . Morbid obesity 08/20/2011  . Chest pain 07/28/2011  . Hemiplegia affecting left nondominant side 07/28/2011  . 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 Conversion disorder 03/10/2011  . Abdominal pain 03/10/2011  . Diarrhea 03/10/2011    GRAY,CHERYL,PT 06/29/2014, 10:10 AM  [facility] [address] [place], [place], [address] Phone: [phone]   Fax:  [phone]

## 2014-06-29 NOTE — Patient Instructions (Signed)
Piriformis (Supine)   Cross legs, right on top is felt in buttock/hip of top leg. Hold __30__ seconds. Repeat _2___ times per set. Do ___1_ sets per session. Do _1___ sessions per day.  http://orth.exer.us/676   Copyright  VHI. All rights reserved.  Stretching: Hamstring (Supine)   Supporting right thigh behind knee, slowly straighten knee until stretch is felt in back of thigh. Hold _30___ seconds. Repeat __2__ times per set. Do __1__ sets per session. Do __1__ sessions per day.  http://orth.exer.us/656   Copyright  VHI. All rights reserved.  Posture - Sitting   Sit upright, head facing forward. Try using a roll to support lower back. Keep shoulders relaxed, and avoid rounded back. Keep hips level with knees. Avoid crossing legs for long periods.   Copyright  VHI. All rights reserved.  Sleeping on Side   Place pillow between knees and feet. Use cervical support under neck and a roll around waist as needed.   Copyright  VHI. All rights reserved.  Sleeping on Back   Place pillow under knees. A pillow with cervical support and a roll around waist are also helpful.   Copyright  VHI. All rights reserved.  Car   Before driving, adjust seat and steering (if tilt control) to ensure good posture. [place] and a lumbar roll can be used for positioning, whether riding or driving.  Copyright  VHI. All rights reserved.  Patient able to return demonstration correctly

## 2014-07-03 ENCOUNTER — Ambulatory Visit: Payer: BLUE CROSS/BLUE SHIELD

## 2014-07-03 DIAGNOSIS — M545 Low back pain: Secondary | ICD-10-CM

## 2014-07-03 NOTE — Therapy (Signed)
[facility] [address] [place], [place], [address] Phone: [phone]   Fax:  [phone]  Physical Therapy Treatment  Patient Details  Name: [name] MRN: [id number] Date of Birth: Sep 23, 1975 Referring Provider:  Vernie Shanks, MD  Encounter Date: 07/03/2014      PT End of Session - 07/03/14 0954    Visit Number 2   Date for PT Re-Evaluation 08/10/14   PT Start Time 0908   PT Stop Time 1009   PT Time Calculation (min) 61 min   Activity Tolerance Patient tolerated treatment well   Behavior During Therapy [facility] for tasks assessed/performed      Past Medical History  Diagnosis Date  . Depression   . Anxiety   . Diabetes mellitus   . GERD (gastroesophageal reflux disease)   . ADD (attention deficit disorder)   . Hyperlipemia   . Herniated disc   . Allergy     Seasonal  . Diabetic neuropathy   . Shortness of breath     with exertion   . Sleep apnea     obsructive settings at 13   . Heart murmur     asa child  . Seizures     hx of / month ago - dehydration   . 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 Degenerative disc disease   . Annular disc tear   . Retrolisthesis     L4 and L5  . Bulging disc   . Chronic pain 04/20/13    Medication prescribed by Vira Blanco, M.D.    Past Surgical History  Procedure Laterality Date  . Cholecystectomy    . Cesarean section    . Tonsillectomy    . Laporoscopy  2007  . Breath tek h pylori  09/11/2011    Procedure: BREATH TEK H PYLORI;  Surgeon: Edward Jolly, MD;  Location: [facility];  Service: General;  Laterality: N/A;  . 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 Bowel resection  11/30/2011    Procedure: SMALL BOWEL RESECTION;  Surgeon: Edward Jolly, MD;  Location: [facility];  Service: General;;  . Esophagogastroduodenoscopy  03/14/2012    Procedure: ESOPHAGOGASTRODUODENOSCOPY (EGD);  Surgeon: Shann Medal, MD;  Location: [facility];  Service: General;  Laterality: N/A;  . Esophagogastroduodenoscopy  04/25/2012    Procedure: ESOPHAGOGASTRODUODENOSCOPY (EGD);  Surgeon: Shann Medal, MD;  Location: [facility];  Service: General;  Laterality: N/A;  . Esophagogastroduodenoscopy N/A 03/31/2013    Procedure: ESOPHAGOGASTRODUODENOSCOPY (EGD);  Surgeon: Shann Medal, MD;  Location: [facility];  Service: General;  Laterality: N/A;  . Esophagogastroduodenoscopy N/A 04/09/2014    Procedure: ESOPHAGOGASTRODUODENOSCOPY (EGD);  Surgeon: Alphonsa Overall, MD;  Location: [facility];  Service: General;  Laterality: N/A;    There were no vitals filed for this visit.  Visit Diagnosis:  Left low back pain, with sciatica presence unspecified      Subjective Assessment - 07/03/14 0908    Subjective Exercises hurt a little bit.  Some pain going down the leg.     Currently in Pain? Yes   Pain Score 6    Pain Location Back   Pain Orientation Left;Mid   Pain Descriptors / Indicators Stabbing   Pain Type Acute pain   Pain Radiating  Towards down Lt leg   Pain Onset 1 to 4 weeks ago   Pain Frequency Constant   Aggravating Factors  standing long periods, going up/down steps, lifting   Pain Relieving Factors hot baths   Multiple Pain Sites No                       [facility] Adult PT Treatment/Exercise - 07/03/14 0001    Exercises   Exercises Lumbar;Knee/Hip   Lumbar Exercises: Stretches   Active Hamstring Stretch 2 reps;30 seconds  supine- good return demo of HEP   Single Knee to Chest Stretch 3 reps;20 seconds   Piriformis Stretch 2 reps;30 seconds  good return demo of HEP   Lumbar Exercises: Supine   Ab Set 5 seconds;15 reps  TA/lower abdominal contraction and series issued for HEP   Modalities   Modalities Electrical Stimulation;Moist Heat   Moist  Heat Therapy   Number Minutes Moist Heat 15 Minutes   Moist Heat Location Other (comment)  bilateral lumbar   Electrical Stimulation   Electrical Stimulation Location bilateral lumbar   Electrical Stimulation Action IFC   Electrical Stimulation Parameters 15 minutes   Electrical Stimulation Goals Pain   Manual Therapy   Manual Therapy Joint mobilization;Myofascial release   Joint Mobilization PA glides Grad II T11-L4 with emphasis on L3   Myofascial Release gentle strumming to bilateral lumar paraspinals                PT Education - 07/03/14 [id number]    Education provided Yes   Education Details HEP: lower abdominal stability exercises   Person(s) Educated Patient   Methods Explanation;Demonstration;Handout   Comprehension Verbalized understanding          PT Short Term Goals - 07/03/14 0914    PT SHORT TERM GOAL #1   Title understand correct body mechanics with home tasks   Status On-going  Body mechanics education received but difficulty with incorprorating   PT SHORT TERM GOAL #2   Title pain with sitting decreased >/= 25%   Status On-going  No change in pain   PT SHORT TERM GOAL #3   Title pain with home activities decreased >/= 25%   Status On-going  no change in pain           PT Long Term Goals - 06/29/14 1005    PT LONG TERM GOAL #1   Title return to gym program at the [facility] with correct body mechanics   Time 6   Period Weeks   Status New   PT LONG TERM GOAL #2   Title sit with pain decreased >/= 75%   Time 6   Period Weeks   Status New   PT LONG TERM GOAL #3   Title perform daily activities with pain decreased >/= 75%   Time 6   Period Weeks   Status New   PT LONG TERM GOAL #4   Title sleep with pain decreased >/= 75%   Time 6   Period Weeks   Status New               Plan - 07/03/14 0915    Clinical Impression Statement Pt with only 1 session since evaluation.  No change in pain or progress toward goals due to only 1 session.   Pt sensitive to gentle mobilization and soft tissue work today.   Pt will benefit from skilled therapeutic intervention in order to improve on the following deficits Decreased  range of motion;Difficulty walking;Decreased endurance;Increased fascial restricitons;Decreased activity tolerance;Increased muscle spasms;Pain;Decreased strength;Decreased mobility   Rehab Potential Excellent   PT Frequency 2x / week   PT Duration 6 weeks   PT Treatment/Interventions Moist Heat;Therapeutic activities;Patient/family education;Therapeutic exercise;Manual techniques;Cryotherapy;Neuromuscular re-education;Electrical Stimulation   PT Next Visit Plan Going to see MD in May regarding lump in back.  Continue with flexibility and spinal stabilization in neutral.   PT Home Exercise Plan reveiw core exercises   Consulted and Agree with Plan of Care Patient        Problem List Patient Active Problem List   Diagnosis Date Noted  . Abdominal pain 04/06/2014  . Adjustment disorder with mixed anxiety and depressed mood 04/27/2013  . Generalized anxiety disorder 04/26/2013  . Abdominal pain, left upper quadrant 03/29/2013  . Depression 12/28/2012  . History of prescription drug abuse 12/09/2012  . Acute encephalopathy 10/03/2012  . Other and unspecified postsurgical nonabsorption 05/19/2012  . Follow-up examination, following other surgery 05/19/2012  . Marginal ulcer 04/15/2012  . 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 LUQ abdominal pain 03/12/2012  . Diabetes mellitus 01/19/2012  . OSA on CPAP 01/19/2012  . History of migraine 01/19/2012  . Morbid obesity 08/20/2011  . Chest pain 07/28/2011  . Hemiplegia affecting left nondominant side 07/28/2011  . 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 Conversion disorder 03/10/2011  . Abdominal pain 03/10/2011  . Diarrhea 03/10/2011    TAKACS,KELLY, PT 07/03/2014, 9:56 AM  [facility] [address], [place], [place], [address] Phone: [phone]   Fax:  [phone]

## 2014-07-03 NOTE — Patient Instructions (Addendum)
Lower abdominal/core stability exercises  1. Practice your breathing technique: Inhale through your nose expanding your belly and rib cage. Try not to breathe into your chest. Exhale slowly and gradually out your mouth feeling a sense of softness to your body. Practice multiple times. This can be performed unlimited.  2. Finding the lower abdominals. Laying on your back with the knees bent, place your fingers just below your belly button. Using your breathing technique from above, on your exhale gently pull the belly button away from your fingertips without tensing any other muscles. Practice this 5x. Next, as you exhale, draw belly button inwards and hold onto it...then feel as if you are pulling that muscle across your pelvis like you are tightening a belt. This can be hard to do at first so be patient and practice. Do 5-10 reps 1-3 x day. Always recognize quality over quantity; if your abdominal muscles become tired you will notice you may tighten/contract other muscles. This is the time to take a break.   Practice this first laying on your back, then in sitting, progressing to standing and finally adding it to all your daily movements.   3. Finding your pelvic floor. Using the breathing technique above, when your exhale, this time draw your pelvic floor muscles up as if you were attempting to stop the flow of urination. Be careful NOT to tense any other muscles. This can be hard, BE PATIENT. Try to hold up to 10 seconds repeating 10x. Try 2x a day. Once you feel you are doing this well, add this contraction to exercise #2. First contracting your pelvic floor followed by lower abdominals.  4. Adding leg movements. Add the following leg movements to challenge your ability to keep your core stable: Do this one: 1. Single leg drop outs: Laying on your back with knees bent feet flat. Inhale,  dropping one knee outward KEEPING YOUR PELVIS STILL. Exhale as you bring the leg back, simultaneously performing  your lower abdominal contraction. Do 5-10 on each leg.  2. Marching: While keeping your pelvis still, lift the right foot a few inches, put it down then lift left foot. This will mimic a march. Start slow to establish control. Once you have control you may speed it up. Do 10-20x. You MUST keep your lower abdominlas contracted while you march. Breathe naturally   3. Single leg slides: Inhale while you slowly slide one leg out keeping your pelvis still. Only slide your leg as far as you can keep your pelvis still. Exhale as you bring the leg back to the start, contracting the lower abdominals as you do that. Keep your upper body relaxed. Do 5-10 on each side.

## 2014-07-06 ENCOUNTER — Encounter: Payer: Self-pay | Admitting: Physical Therapy

## 2014-07-09 ENCOUNTER — Ambulatory Visit: Payer: BLUE CROSS/BLUE SHIELD | Admitting: Physical Therapy

## 2014-07-09 ENCOUNTER — Encounter: Payer: Self-pay | Admitting: Physical Therapy

## 2014-07-09 DIAGNOSIS — M545 Low back pain: Secondary | ICD-10-CM | POA: Diagnosis not present

## 2014-07-09 NOTE — Therapy (Signed)
[facility] [address], [place], [place], [address] Phone: [phone]   Fax:  [phone]  Physical Therapy Treatment  Patient Details  Name: [name] MRN: [id number] Date of Birth: June 11, 1975 Referring Provider:  Vernie Shanks, MD  Encounter Date: 07/09/2014      PT End of Session - 07/09/14 1224    Visit Number 3   Date for PT Re-Evaluation 08/10/14   PT Start Time 7858   PT Stop Time 1225   PT Time Calculation (min) 40 min   Activity Tolerance Patient tolerated treatment well   Behavior During Therapy [facility] for tasks assessed/performed      Past Medical History  Diagnosis Date  . Depression   . Anxiety   . Diabetes mellitus   . GERD (gastroesophageal reflux disease)   . ADD (attention deficit disorder)   . Hyperlipemia   . Herniated disc   . Allergy     Seasonal  . Diabetic neuropathy   . Shortness of breath     with exertion   . Sleep apnea     obsructive settings at 13   . Heart murmur     asa child  . Seizures     hx of / month ago - dehydration   . 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 Degenerative disc disease   . Annular disc tear   . Retrolisthesis     L4 and L5  . Bulging disc   . Chronic pain 04/20/13    Medication prescribed by Vira Blanco, M.D.    Past Surgical History  Procedure Laterality Date  . Cholecystectomy    . Cesarean section    . Tonsillectomy    . Laporoscopy  2007  . Breath tek h pylori  09/11/2011    Procedure: BREATH TEK H PYLORI;  Surgeon: Edward Jolly, MD;  Location: [facility];  Service: General;  Laterality: N/A;  . 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 Bowel resection  11/30/2011    Procedure: SMALL BOWEL RESECTION;  Surgeon: Edward Jolly, MD;  Location: [facility];  Service: General;;  . Esophagogastroduodenoscopy  03/14/2012    Procedure: ESOPHAGOGASTRODUODENOSCOPY (EGD);  Surgeon: Shann Medal, MD;  Location: [facility];  Service: General;  Laterality: N/A;  . Esophagogastroduodenoscopy  04/25/2012    Procedure: ESOPHAGOGASTRODUODENOSCOPY (EGD);  Surgeon: Shann Medal, MD;  Location: [facility];  Service: General;  Laterality: N/A;  . Esophagogastroduodenoscopy N/A 03/31/2013    Procedure: ESOPHAGOGASTRODUODENOSCOPY (EGD);  Surgeon: Shann Medal, MD;  Location: [facility];  Service: General;  Laterality: N/A;  . Esophagogastroduodenoscopy N/A 04/09/2014    Procedure: ESOPHAGOGASTRODUODENOSCOPY (EGD);  Surgeon: Alphonsa Overall, MD;  Location: [facility];  Service: General;  Laterality: N/A;    There were no vitals filed for this visit.  Visit Diagnosis:  Left low back pain, with sciatica presence unspecified      Subjective Assessment - 07/09/14 1148    Subjective I had to quickly pick up my daughter and re-injured my back. I had massage but did not help.  The knot will feel like it will bulge up higher throughout the day.    Limitations Sitting;Standing;Walking   How long can you sit comfortably? 30 min. tingling in left leg.  How long can you stand comfortably? 20 min then tingling in left leg.    How long can you walk comfortably? 30-40 min then has a nagging back pain.   Diagnostic tests ultrasound on lump where they found a solid mass.     Patient Stated Goals Household without pain, return to the gym   Currently in Pain? Yes   Pain Score 6    Pain Location Back   Pain Orientation Lower   Pain Descriptors / Indicators Stabbing  numbness and tingling into left foot   Pain Type Acute pain   Pain Radiating Towards down left leg.   Pain Onset 1 to 4 weeks ago   Pain Frequency Constant   Aggravating Factors  standing long periods, going up/down steps, lifting   Pain Relieving Factors hot baths   Effect of Pain on Daily  Activities pain with all activities   Multiple Pain Sites No            [facility] PT Assessment - 07/09/14 0001    AROM   Lumbar Flexion decreased by 50%   Lumbar Extension decreased by 75%   Lumbar - Right Side Bend decreased by 75%   Lumbar - Left Side Bend decreased by 75%                     [facility] Adult PT Treatment/Exercise - 07/09/14 0001    Lumbar Exercises: Quadruped   Madcat/Old Horse 10 reps   Madcat/Old Horse Limitations soreness in left L5 and along sacrum   Knee/Hip Exercises: Standing   Other Standing Knee Exercises lateral shift with hips to right holding 2 min  tactile cues for the movement   Manual Therapy   Manual Therapy Joint mobilization;Massage   Joint Mobilization P-A glide and gapping of left facets of L3-L5   Massage left lumbar sacral area, sideglide to L5   Myofascial Release gentle strumming to bilateral lumar paraspinals                PT Education - 07/09/14 1224    Education provided Yes   Education Details lateral shift and neural tension stretch   Person(s) Educated Patient   Methods Explanation;Demonstration;Handout;Verbal cues   Comprehension Verbalized understanding;Returned demonstration          PT Short Term Goals - 07/09/14 1227    PT SHORT TERM GOAL #1   Title understand correct body mechanics with home tasks   Time 3   Period Weeks   Status On-going   PT SHORT TERM GOAL #2   Title pain with sitting decreased >/= 25%   Time 3   Period Weeks   Status On-going  recent flare-up   PT SHORT TERM GOAL #3   Title pain with home activities decreased >/= 25%   Time 3   Period Weeks   Status On-going  recent flare-up   PT SHORT TERM GOAL #4   Title pain with sleeping decreased >/= 25%   Time 3   Period Weeks   Status New  recent flare-up           PT Long Term Goals - 06/29/14 1005    PT LONG TERM GOAL #1   Title return to gym program at the [facility] with correct body mechanics   Time 6   Period  Weeks   Status New   PT LONG TERM GOAL #2   Title sit with pain decreased >/= 75%   Time 6  Period Weeks   Status New   PT LONG TERM GOAL #3   Title perform daily activities with pain decreased >/= 75%   Time 6   Period Weeks   Status New   PT LONG TERM GOAL #4   Title sleep with pain decreased >/= 75%   Time 6   Period Weeks   Status New               Plan - 07/09/14 1225    Clinical Impression Statement Patient continues to have pain in lumbar sacral area that will radiate with numbness and tingling into the left leg.    Pt will benefit from skilled therapeutic intervention in order to improve on the following deficits Decreased range of motion;Difficulty walking;Decreased endurance;Increased fascial restricitons;Decreased activity tolerance;Increased muscle spasms;Pain;Decreased strength;Decreased mobility   Rehab Potential Excellent   PT Frequency 2x / week   PT Duration 6 weeks   PT Treatment/Interventions Moist Heat;Therapeutic activities;Patient/family education;Therapeutic exercise;Manual techniques;Cryotherapy;Neuromuscular re-education;Electrical Stimulation   PT Next Visit Plan stabilization exercises in neutral. work on lateral shift to centralize pain.    PT Home Exercise Plan back stabilization in neutral   Consulted and Agree with Plan of Care Patient        Problem List Patient Active Problem List   Diagnosis Date Noted  . Abdominal pain 04/06/2014  . Adjustment disorder with mixed anxiety and depressed mood 04/27/2013  . Generalized anxiety disorder 04/26/2013  . Abdominal pain, left upper quadrant 03/29/2013  . Depression 12/28/2012  . History of prescription drug abuse 12/09/2012  . Acute encephalopathy 10/03/2012  . Other and unspecified postsurgical nonabsorption 05/19/2012  . Follow-up examination, following other surgery 05/19/2012  . Marginal ulcer 04/15/2012  . 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 LUQ abdominal pain  03/12/2012  . Diabetes mellitus 01/19/2012  . OSA on CPAP 01/19/2012  . History of migraine 01/19/2012  . Morbid obesity 08/20/2011  . Chest pain 07/28/2011  . Hemiplegia affecting left nondominant side 07/28/2011  . 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 Conversion disorder 03/10/2011  . Abdominal pain 03/10/2011  . Diarrhea 03/10/2011    GRAY,CHERYL,PT 07/09/2014, 12:30 PM  [facility] [address], [place], [place], [address] Phone: [phone]   Fax:  [phone]

## 2014-07-09 NOTE — Patient Instructions (Addendum)
Lower Limb Neural Tension (Sitting)   Feet on floor, hands behind back, slump forward, bending neck. Straighten right knee until stretch is felt. Hold _1___ seconds. Relax. Repeat _5___ times per set. Do __2__ sets per session. Do __2__ sessions per day.  http://orth.exer.us/282   Copyright  VHI. All rights reserved.     Wall Lean Stretch   With right elbow against wall, slowly stretch hips toward wall, other arm supporting trunk. Hold _60___ seconds. Relax. Repeat __2__ times per set. Do _1___ sets per session. Do __4__ sessions per day.  http://orth.exer.us/102   Copyright  VHI. All rights reserved.  Patient is able to return demonstration correctly

## 2014-07-11 ENCOUNTER — Encounter: Payer: Self-pay | Admitting: Physical Therapy

## 2014-07-11 ENCOUNTER — Ambulatory Visit: Payer: BLUE CROSS/BLUE SHIELD | Admitting: Physical Therapy

## 2014-07-11 DIAGNOSIS — M545 Low back pain: Secondary | ICD-10-CM | POA: Diagnosis not present

## 2014-07-11 NOTE — Therapy (Signed)
[facility] [address] [place], [place], [address] Phone: [phone]   Fax:  [phone]  Physical Therapy Treatment  Patient Details  Name: [name] MRN: [id number] Date of Birth: Oct 11, 1975 Referring Provider:  Vernie Shanks, MD  Encounter Date: 07/11/2014      PT End of Session - 07/11/14 1054    Visit Number 4   Date for PT Re-Evaluation 08/10/14   PT Start Time 8003   PT Stop Time 1115   PT Time Calculation (min) 60 min   Activity Tolerance Patient tolerated treatment well   Behavior During Therapy [facility] for tasks assessed/performed      Past Medical History  Diagnosis Date  . Depression   . Anxiety   . Diabetes mellitus   . GERD (gastroesophageal reflux disease)   . ADD (attention deficit disorder)   . Hyperlipemia   . Herniated disc   . Allergy     Seasonal  . Diabetic neuropathy   . Shortness of breath     with exertion   . Sleep apnea     obsructive settings at 13   . Heart murmur     asa child  . Seizures     hx of / month ago - dehydration   . 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 Degenerative disc disease   . Annular disc tear   . Retrolisthesis     L4 and L5  . Bulging disc   . Chronic pain 04/20/13    Medication prescribed by Vira Blanco, M.D.    Past Surgical History  Procedure Laterality Date  . Cholecystectomy    . Cesarean section    . Tonsillectomy    . Laporoscopy  2007  . Breath tek h pylori  09/11/2011    Procedure: BREATH TEK H PYLORI;  Surgeon: Edward Jolly, MD;  Location: [facility];  Service: General;  Laterality: N/A;  . 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 Bowel resection  11/30/2011    Procedure: SMALL BOWEL RESECTION;  Surgeon: Edward Jolly, MD;  Location: [facility];  Service: General;;  . Esophagogastroduodenoscopy  03/14/2012    Procedure: ESOPHAGOGASTRODUODENOSCOPY (EGD);  Surgeon: Shann Medal, MD;  Location: [facility];  Service: General;  Laterality: N/A;  . Esophagogastroduodenoscopy  04/25/2012    Procedure: ESOPHAGOGASTRODUODENOSCOPY (EGD);  Surgeon: Shann Medal, MD;  Location: [facility];  Service: General;  Laterality: N/A;  . Esophagogastroduodenoscopy N/A 03/31/2013    Procedure: ESOPHAGOGASTRODUODENOSCOPY (EGD);  Surgeon: Shann Medal, MD;  Location: [facility];  Service: General;  Laterality: N/A;  . Esophagogastroduodenoscopy N/A 04/09/2014    Procedure: ESOPHAGOGASTRODUODENOSCOPY (EGD);  Surgeon: Alphonsa Overall, MD;  Location: [facility];  Service: General;  Laterality: N/A;    There were no vitals filed for this visit.  Visit Diagnosis:  Left low back pain, with sciatica presence unspecified      Subjective Assessment - 07/11/14 1020    Subjective Manual work made her hurt. Reports "swelling" in lumbar area at end of day.    Currently in Pain? Yes   Pain Score 6    Pain Location Back   Pain Orientation Lower   Pain Descriptors / Indicators Stabbing   Pain Type Acute pain   Aggravating  Factors  Standing too long, stairs   Pain Relieving Factors Nothing consistently.   Multiple Pain Sites No                         [facility] Adult PT Treatment/Exercise - 07/11/14 0001    Lumbar Exercises: Aerobic   Stationary Bike L1 x withlumbar support   Lumbar Exercises: Supine   Other Supine Lumbar Exercises Decompression breathing x 1 min   Other Supine Lumbar Exercises TA series, given or HEP   Moist Heat Therapy   Number Minutes Moist Heat 15 Minutes   Moist Heat Location --  BIl lumosacral   Electrical Stimulation   Electrical Stimulation Location Bil lumbar   Electrical Stimulation Action IFC   Electrical Stimulation Parameters 15 min   Electrical Stimulation Goals Pain                 PT Education - 07/11/14 1033    Education provided Yes   Education Details TA series for HEP   Person(s) Educated Patient   Methods Explanation;Demonstration;Tactile cues;Verbal cues;Handout   Comprehension Verbalized understanding;Returned demonstration          PT Short Term Goals - 07/09/14 1227    PT SHORT TERM GOAL #1   Title understand correct body mechanics with home tasks   Time 3   Period Weeks   Status On-going   PT SHORT TERM GOAL #2   Title pain with sitting decreased >/= 25%   Time 3   Period Weeks   Status On-going  recent flare-up   PT SHORT TERM GOAL #3   Title pain with home activities decreased >/= 25%   Time 3   Period Weeks   Status On-going  recent flare-up   PT SHORT TERM GOAL #4   Title pain with sleeping decreased >/= 25%   Time 3   Period Weeks   Status New  recent flare-up           PT Long Term Goals - 06/29/14 1005    PT LONG TERM GOAL #1   Title return to [facility] program at the [facility] with correct body mechanics   Time 6   Period Weeks   Status New   PT LONG TERM GOAL #2   Title sit with pain decreased >/= 75%   Time 6   Period Weeks   Status New   PT LONG TERM GOAL #3   Title perform daily activities with pain decreased >/= 75%   Time 6   Period Weeks   Status New   PT LONG TERM GOAL #4   Title sleep with pain decreased >/= 75%   Time 6   Period Weeks   Status New               Plan - 07/11/14 1054    Clinical Impression Statement Patient prefers light soft tissue work versus mobilizations. Introduced core stabilization today and added to HEP.    Pt will benefit from skilled therapeutic intervention in order to improve on the following deficits Decreased range of motion;Difficulty walking;Decreased endurance;Increased fascial restricitons;Decreased activity tolerance;Increased muscle spasms;Pain;Decreased strength;Decreased mobility   Rehab Potential Excellent   PT Frequency 2x / week   PT Duration 6 weeks   PT  Treatment/Interventions Moist Heat;Therapeutic activities;Patient/family education;Therapeutic exercise;Manual techniques;Cryotherapy;Neuromuscular re-education;Electrical Stimulation   PT Next Visit Plan Review HEP, gentle soft tissue work, Network engineer with Plan of Care Patient  Problem List Patient Active Problem List   Diagnosis Date Noted  . Abdominal pain 04/06/2014  . Adjustment disorder with mixed anxiety and depressed mood 04/27/2013  . Generalized anxiety disorder 04/26/2013  . Abdominal pain, left upper quadrant 03/29/2013  . Depression 12/28/2012  . History of prescription drug abuse 12/09/2012  . Acute encephalopathy 10/03/2012  . Other and unspecified postsurgical nonabsorption 05/19/2012  . Follow-up examination, following other surgery 05/19/2012  . Marginal ulcer 04/15/2012  . 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 LUQ abdominal pain 03/12/2012  . Diabetes mellitus 01/19/2012  . OSA on CPAP 01/19/2012  . History of migraine 01/19/2012  . Morbid obesity 08/20/2011  . Chest pain 07/28/2011  . Hemiplegia affecting left nondominant side 07/28/2011  . 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 Conversion disorder 03/10/2011  . Abdominal pain 03/10/2011  . Diarrhea 03/10/2011    Myrene Galas , PTA [facility] [address], [place], [place], [address] Phone: [phone]   Fax:  [phone]

## 2014-07-11 NOTE — Patient Instructions (Addendum)
Lower abdominal/core stability exercises  1. Practice your breathing technique: Inhale through your nose expanding your belly and rib cage. Try not to breathe into your chest. Exhale slowly and gradually out your mouth feeling a sense of softness to your body. Practice multiple times. This can be performed unlimited.  2. Finding the lower abdominals. Laying on your back with the knees bent, place your fingers just below your belly button. Using your breathing technique from above, on your exhale gently pull the belly button away from your fingertips without tensing any other muscles. Practice this 5x. Next, as you exhale, draw belly button inwards and hold onto it...then feel as if you are pulling that muscle across your pelvis like you are tightening a belt. This can be hard to do at first so be patient and practice. Do 5-10 reps 1-3 x day. Always recognize quality over quantity; if your abdominal muscles become tired you will notice you may tighten/contract other muscles. This is the time to take a break.   Practice this first laying on your back, then in sitting, progressing to standing and finally adding it to all your daily movements.   3. Finding your pelvic floor. Using the breathing technique above, when your exhale, this time draw your pelvic floor muscles up as if you were attempting to stop the flow of urination. Be careful NOT to tense any other muscles. This can be hard, BE PATIENT. Try to hold up to 10 seconds repeating 10x. Try 2x a day. Once you feel you are doing this well, add this contraction to exercise #2. First contracting your pelvic floor followed by lower abdominals.  4. Adding leg movements. Add the following leg movements to challenge your ability to keep your core stable:  1. Single leg drop outs: Laying on your back with knees bent feet flat. Inhale,  dropping one knee outward KEEPING YOUR PELVIS STILL. Exhale as you bring the leg back, simultaneously performing your lower  abdominal contraction. Do 5-10 on each leg.  2. Marching: While keeping your pelvis still, lift the right foot a few inches, put it down then lift left foot. This will mimic a march. Start slow to establish control. Once you have control you may speed it up. Do 10-20x. You MUST keep your lower abdominlas contracted while you march. Breathe naturally   3. Single leg slides: Inhale while you slowly slide one leg out keeping your pelvis still. Only slide your leg as far as you can keep your pelvis still. Exhale as you bring the leg back to the start, contracting the lower abdominals as you do that. Keep your upper body relaxed. Do 5-10 on each side.  Verbally instructed pt to do smaller, shorter walks throughout the day. Pt verbally agreed to do.

## 2014-07-13 ENCOUNTER — Encounter: Payer: Self-pay | Admitting: Physical Therapy

## 2014-07-16 ENCOUNTER — Encounter: Payer: Self-pay | Admitting: Physical Therapy

## 2014-07-18 ENCOUNTER — Encounter: Payer: Self-pay | Admitting: Physical Therapy

## 2014-07-18 ENCOUNTER — Ambulatory Visit: Payer: BLUE CROSS/BLUE SHIELD | Admitting: Physical Therapy

## 2014-07-18 DIAGNOSIS — M545 Low back pain: Secondary | ICD-10-CM

## 2014-07-18 NOTE — Patient Instructions (Signed)
Posture Tips DO: - stand tall and erect - keep chin tucked in - keep head and shoulders in alignment - check posture regularly in mirror or large window - pull head back against headrest in car seat;  Change your position often.  Sit with lumbar support. DON'T: - slouch or slump while watching TV or reading - sit, stand or lie in one position  for too long;  Sitting is especially hard on the spine so if you sit at a desk/use the computer, then stand up often!   Copyright  VHI. All rights reserved.  Posture - Standing   Good posture is important. Avoid slouching and forward head thrust. Maintain curve in low back and align ears over shoul- ders, hips over ankles.  Pull your belly button in toward your back bone.   Copyright  VHI. All rights reserved.  Posture - Sitting   Sit upright, head facing forward. Try using a roll to support lower back. Keep shoulders relaxed, and avoid rounded back. Keep hips level with knees. Avoid crossing legs for long periods.   Copyright  VHI. All rights reserved.  Sleeping on Back  Place pillow under knees. A pillow with cervical support and a roll around waist are also helpful. Copyright  VHI. All rights reserved.  Sleeping on Side Place pillow between knees. Use cervical support under neck and a roll around waist as needed. Copyright  VHI. All rights reserved.   Sleeping on Stomach   If this is the only desirable sleeping position, place pillow under lower legs, and under stomach or chest as needed.  Posture - Sitting   Sit upright, head facing forward. Try using a roll to support lower back. Keep shoulders relaxed, and avoid rounded back. Keep hips level with knees. Avoid crossing legs for long periods. Stand to Sit / Sit to Stand   To sit: Bend knees to lower self onto front edge of chair, then scoot back on seat. To stand: Reverse sequence by placing one foot forward, and scoot to front of seat. Use rocking motion to stand up.   Work  Height and Reach  Ideal work height is no more than 2 to 4 inches below elbow level when standing, and at elbow level when sitting. Reaching should be limited to arm's length, with elbows slightly bent.  Bending  Bend at hips and knees, not back. Keep feet shoulder-width apart.    Posture - Standing   Good posture is important. Avoid slouching and forward head thrust. Maintain curve in low back and align ears over shoul- ders, hips over ankles.  Alternating Positions   Alternate tasks and change positions frequently to reduce fatigue and muscle tension. Take rest breaks. Computer Work   Position work to Programmer, multimedia. Use proper work and seat height. Keep shoulders back and down, wrists straight, and elbows at right angles. Use chair that provides full back support. Add footrest and lumbar roll as needed.  Getting Into / Out of Car  Lower self onto seat, scoot back, then bring in one leg at a time. Reverse sequence to get out.  Dressing  Lie on back to pull socks or slacks over feet, or sit and bend leg while keeping back straight.    Housework - Sink  Place one foot on ledge of cabinet under sink when standing at sink for prolonged periods.   Pushing / Pulling  Pushing is preferable to pulling. Keep back in proper alignment, and use leg muscles to do the  do the work.  Deep Squat   Squat and lift with both arms held against upper trunk. Tighten stomach muscles without holding breath. Use smooth movements to avoid jerking.  Avoid Twisting   Avoid twisting or bending back. Pivot around using foot movements, and bend at knees if needed when reaching for articles.  Carrying Luggage   Distribute weight evenly on both sides. Use a cart whenever possible. Do not twist trunk. Move body as a unit.   Lifting Principles .Maintain proper posture and head alignment. .Slide object as close as possible before lifting. .Move obstacles out of the way. .Test before  lifting; ask for help if too heavy. .Tighten stomach muscles without holding breath. .Use smooth movements; do not jerk. .Use legs to do the work, and pivot with feet. .Distribute the work load symmetrically and close to the center of trunk. .Push instead of pull whenever possible.   Ask For Help   Ask for help and delegate to others when possible. Coordinate your movements when lifting together, and maintain the low back curve.  Log Roll   Lying on back, bend left knee and place left arm across chest. Roll all in one movement to the right. Reverse to roll to the left. Always move as one unit. Housework - Sweeping  Use long-handled equipment to avoid stooping.   Housework - Wiping  Position yourself as close as possible to reach work surface. Avoid straining your back.  Laundry - Unloading Wash   To unload small items at bottom of washer, lift leg opposite to arm being used to reach.  Gardening - Raking  Move close to area to be raked. Use arm movements to do the work. Keep back straight and avoid twisting.     Cart  When reaching into cart with one arm, lift opposite leg to keep back straight.   Getting Into / Out of Bed  Lower self to lie down on one side by raising legs and lowering head at the same time. Use arms to assist moving without twisting. Bend both knees to roll onto back if desired. To sit up, start from lying on side, and use same move-ments in reverse. Housework - Vacuuming  Hold the vacuum with arm held at side. Step back and forth to move it, keeping head up. Avoid twisting.   Laundry - Loading Wash  Position laundry basket so that bending and twisting can be avoided.   Laundry - Unloading Dryer  Squat down to reach into clothes dryer or use a reacher.  Gardening - Weeding / Planting  Squat or Kneel. Knee pads may be helpful.                    

## 2014-07-18 NOTE — Therapy (Signed)
[facility] [address], [place], [place], [address] Phone: [phone]   Fax:  [phone]  Physical Therapy Treatment  Patient Details  Name: [name] MRN: [id number] Date of Birth: [birth year] Referring Provider:  Vernie Shanks, MD  Encounter Date: 07/18/2014      PT End of Session - 07/18/14 0921    Visit Number 5   Date for PT Re-Evaluation 08/10/14   PT Start Time 0829   PT Stop Time 0940   PT Time Calculation (min) 71 min   Activity Tolerance Patient tolerated treatment well   Behavior During Therapy [facility] for tasks assessed/performed      Past Medical History  Diagnosis Date  . Depression   . Anxiety   . Diabetes mellitus   . GERD (gastroesophageal reflux disease)   . ADD (attention deficit disorder)   . Hyperlipemia   . Herniated disc   . Allergy     Seasonal  . Diabetic neuropathy   . Shortness of breath     with exertion   . Sleep apnea     obsructive settings at 13   . Heart murmur     asa child  . Seizures     hx of / month ago - dehydration   . 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 Degenerative disc disease   . Annular disc tear   . Retrolisthesis     L4 and L5  . Bulging disc   . Chronic pain 04/20/13    Medication prescribed by Vira Blanco, M.D.    Past Surgical History  Procedure Laterality Date  . Cholecystectomy    . Cesarean section    . Tonsillectomy    . Laporoscopy  2007  . Breath tek h pylori  09/11/2011    Procedure: BREATH TEK H PYLORI;  Surgeon: Edward Jolly, MD;  Location: [facility];  Service: General;  Laterality: N/A;  . 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 Bowel resection  11/30/2011    Procedure: SMALL BOWEL RESECTION;  Surgeon: Edward Jolly, MD;  Location: [facility];  Service: General;;  . Esophagogastroduodenoscopy  03/14/2012    Procedure: ESOPHAGOGASTRODUODENOSCOPY (EGD);  Surgeon: Shann Medal, MD;  Location: [facility];  Service: General;  Laterality: N/A;  . Esophagogastroduodenoscopy  04/25/2012    Procedure: ESOPHAGOGASTRODUODENOSCOPY (EGD);  Surgeon: Shann Medal, MD;  Location: [facility];  Service: General;  Laterality: N/A;  . Esophagogastroduodenoscopy N/A 03/31/2013    Procedure: ESOPHAGOGASTRODUODENOSCOPY (EGD);  Surgeon: Shann Medal, MD;  Location: [facility];  Service: General;  Laterality: N/A;  . Esophagogastroduodenoscopy N/A 04/09/2014    Procedure: ESOPHAGOGASTRODUODENOSCOPY (EGD);  Surgeon: Alphonsa Overall, MD;  Location: [facility];  Service: General;  Laterality: N/A;    There were no vitals filed for this visit.  Visit Diagnosis:  Left low back pain, with sciatica presence unspecified      Subjective Assessment - 07/18/14 0831    Subjective Was not feelingwell the other day. Reports hurting after last tx.    Currently in Pain? Yes   Pain Score 4    Pain Location Leg   Pain Orientation Right   Pain Descriptors / Indicators Numbness;Cramping;Tightness   Pain Type Chronic pain   Aggravating Factors  Standing  too long   Pain Relieving Factors Nothing consistently   Multiple Pain Sites No                         [facility] Adult PT Treatment/Exercise - 07/18/14 0001    Lumbar Exercises: Supine   Ab Set 5 reps;2 seconds   Heel Slides 5 reps   Bent Knee Raise 5 reps   Bridge 5 reps   Bridge Limitations With ball squeeze, small ROM 2 sets   Moist Heat Therapy   Number Minutes Moist Heat 15 Minutes   Moist Heat Location --  BIl lumosacral   Electrical Stimulation   Electrical Stimulation Action IFC   Electrical Stimulation Parameters 15 min   Electrical Stimulation Goals Pain   Manual Therapy   Manual Therapy Other (comment)   Joint Mobilization Soft tissue work to lumbar in prone                 PT Education - 07/18/14 0857    Education provided Yes   Education Details Body mechanics for lumbar protection, ADLs   Person(s) Educated Patient   Methods Explanation;Demonstration;Handout   Comprehension Verbalized understanding;Returned demonstration          PT Short Term Goals - 07/18/14 0852    PT SHORT TERM GOAL #1   Title understand correct body mechanics with home tasks   Time 3   Period Weeks   Status Achieved   PT SHORT TERM GOAL #2   Title pain with sitting decreased >/= 25%   Time 3   Period Weeks   Status On-going  Very little   PT SHORT TERM GOAL #3   Title pain with home activities decreased >/= 25%   Time 3   Period Weeks   Status On-going  Very little change   PT SHORT TERM GOAL #4   Title pain with sleeping decreased >/= 25%   Time 3   Period Weeks   Status On-going  Very little pt reports           PT Long Term Goals - 06/29/14 1005    PT LONG TERM GOAL #1   Title return to gym program at the [facility] with correct body mechanics   Time 6   Period Weeks   Status New   PT LONG TERM GOAL #2   Title sit with pain decreased >/= 75%   Time 6   Period Weeks   Status New   PT LONG TERM GOAL #3   Title perform daily activities with pain decreased >/= 75%   Time 6   Period Weeks   Status New   PT LONG TERM GOAL #4   Title sleep with pain decreased >/= 75%   Time 6   Period Weeks   Status New               Plan - 07/18/14 0924    Clinical Impression Statement Pt performing core exercises correctly, educated pt in lumbar supportive body mechanics for ADLS, no goal met secondary to pt's reports of pain only " slightly better, maybe."   Pt will benefit from skilled therapeutic intervention in order to improve on the following deficits Decreased range of motion;Difficulty walking;Decreased endurance;Increased fascial restricitons;Decreased activity tolerance;Increased muscle spasms;Pain;Decreased strength;Decreased  mobility   Rehab Potential Excellent   PT Frequency 2x / week   PT Duration 6 weeks   PT Treatment/Interventions Moist Heat;Therapeutic activities;Patient/family education;Therapeutic exercise;Manual techniques;Cryotherapy;Neuromuscular re-education;Electrical Stimulation  PT Next Visit Plan Continue with core/trunk strength, soft tissue work, Geneticist, molecular with Plan of Care Patient        Problem List Patient Active Problem List   Diagnosis Date Noted  . Abdominal pain 04/06/2014  . Adjustment disorder with mixed anxiety and depressed mood 04/27/2013  . Generalized anxiety disorder 04/26/2013  . Abdominal pain, left upper quadrant 03/29/2013  . Depression 12/28/2012  . History of prescription drug abuse 12/09/2012  . Acute encephalopathy 10/03/2012  . Other and unspecified postsurgical nonabsorption 05/19/2012  . Follow-up examination, following other surgery 05/19/2012  . Marginal ulcer 04/15/2012  . 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 LUQ abdominal pain 03/12/2012  . Diabetes mellitus 01/19/2012  . OSA on CPAP 01/19/2012  . History of migraine 01/19/2012  . Morbid obesity 08/20/2011  . Chest pain 07/28/2011  . Hemiplegia affecting left nondominant side 07/28/2011  . 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 Conversion disorder 03/10/2011  . Abdominal pain 03/10/2011  . Diarrhea 03/10/2011    ,, PTA 07/18/2014, 9:26 AM  [facility] [address], [place], [place], [address] Phone: [phone]   Fax:  [phone]

## 2014-07-23 ENCOUNTER — Encounter: Payer: Self-pay | Admitting: Physical Therapy

## 2014-07-23 ENCOUNTER — Ambulatory Visit: Payer: BLUE CROSS/BLUE SHIELD | Attending: Family Medicine | Admitting: Physical Therapy

## 2014-07-23 DIAGNOSIS — M545 Low back pain: Secondary | ICD-10-CM | POA: Insufficient documentation

## 2014-07-23 NOTE — Therapy (Signed)
[facility] [address] [place], [place], [address] Phone: [phone]   Fax:  [phone]  Physical Therapy Treatment  Patient Details  Name: [name] MRN: [id number] Date of Birth: 1975-08-21 Referring Provider:  Vernie Shanks, MD  Encounter Date: 07/23/2014      PT End of Session - 07/23/14 0924    Visit Number 6   Date for PT Re-Evaluation 08/10/14   PT Start Time 0842   PT Stop Time 0945   PT Time Calculation (min) 63 min   Activity Tolerance Patient limited by fatigue   Behavior During Therapy Anxious      Past Medical History  Diagnosis Date  . Depression   . Anxiety   . Diabetes mellitus   . GERD (gastroesophageal reflux disease)   . ADD (attention deficit disorder)   . Hyperlipemia   . Herniated disc   . Allergy     Seasonal  . Diabetic neuropathy   . Shortness of breath     with exertion   . Sleep apnea     obsructive settings at 13   . Heart murmur     asa child  . Seizures     hx of / month ago - dehydration   . 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 Degenerative disc disease   . Annular disc tear   . Retrolisthesis     L4 and L5  . Bulging disc   . Chronic pain 04/20/13    Medication prescribed by Vira Blanco, M.D.    Past Surgical History  Procedure Laterality Date  . Cholecystectomy    . Cesarean section    . Tonsillectomy    . Laporoscopy  2007  . Breath tek h pylori  09/11/2011    Procedure: BREATH TEK H PYLORI;  Surgeon: Edward Jolly, MD;  Location: [facility];  Service: General;  Laterality: N/A;  . 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 Bowel resection  11/30/2011    Procedure: SMALL BOWEL RESECTION;  Surgeon: Edward Jolly, MD;  Location: [facility];  Service: General;;  .  Esophagogastroduodenoscopy  03/14/2012    Procedure: ESOPHAGOGASTRODUODENOSCOPY (EGD);  Surgeon: Shann Medal, MD;  Location: [facility];  Service: General;  Laterality: N/A;  . Esophagogastroduodenoscopy  04/25/2012    Procedure: ESOPHAGOGASTRODUODENOSCOPY (EGD);  Surgeon: Shann Medal, MD;  Location: [facility];  Service: General;  Laterality: N/A;  . Esophagogastroduodenoscopy N/A 03/31/2013    Procedure: ESOPHAGOGASTRODUODENOSCOPY (EGD);  Surgeon: Shann Medal, MD;  Location: [facility];  Service: General;  Laterality: N/A;  . Esophagogastroduodenoscopy N/A 04/09/2014    Procedure: ESOPHAGOGASTRODUODENOSCOPY (EGD);  Surgeon: Alphonsa Overall, MD;  Location: [facility];  Service: General;  Laterality: N/A;    There were no vitals filed for this visit.  Visit Diagnosis:  Left low back pain, with sciatica presence unspecified      Subjective Assessment - 07/23/14 0848    Subjective Very tired after not sleeping last night. Very stressful day ahead of her with issues at school.    Currently in Pain? Yes   Pain Score 8    Pain Location Leg  and central back   Pain Orientation Left   Pain Descriptors / Indicators Aching;Radiating;Tightness   Pain Type Chronic  pain   Aggravating Factors  Standing too long   Pain Relieving Factors Nothing consistently   Multiple Pain Sites No                         [facility] Adult PT Treatment/Exercise - 07/23/14 0001    Lumbar Exercises: Supine   Ab Set --  7 reps 2 sec   Heel Slides 5 reps   Bent Knee Raise --  7 reps 2 sec   Bridge Non-compliant;10 reps   Bridge Limitations With ball squeeze, small ROM 2 sets   Moist Heat Therapy   Number Minutes Moist Heat 15 Minutes   Moist Heat Location --  BIl lumosacral   Electrical Stimulation   Electrical Stimulation Location Bil lumbar   Electrical Stimulation Action IFC   Electrical Stimulation Parameters 20   Electrical Stimulation Goals Pain   Manual Therapy   Manual  Therapy Other (comment)   Joint Mobilization Soft tissue work to lumbar in prone                PT Education - 07/23/14 [phone]    Education provided Yes   Education Details Activities/exercises to do in pool at the gym.    Person(s) Educated Patient   Methods Explanation   Comprehension Verbalized understanding          PT Short Term Goals - 07/23/14 0928    PT SHORT TERM GOAL #1   Title understand correct body mechanics with home tasks   Time 3   Period Weeks   Status Achieved   PT SHORT TERM GOAL #2   Title pain with sitting decreased >/= 25%   Time 3   Period Weeks   Status On-going   PT SHORT TERM GOAL #3   Title pain with home activities decreased >/= 25%   Time 3   Period Weeks   Status On-going   PT SHORT TERM GOAL #4   Title pain with sleeping decreased >/= 25%   Time 3   Period Weeks   Status On-going           PT Long Term Goals - 06/29/14 1005    PT LONG TERM GOAL #1   Title return to gym program at the [facility] with correct body mechanics   Time 6   Period Weeks   Status New   PT LONG TERM GOAL #2   Title sit with pain decreased >/= 75%   Time 6   Period Weeks   Status New   PT LONG TERM GOAL #3   Title perform daily activities with pain decreased >/= 75%   Time 6   Period Weeks   Status New   PT LONG TERM GOAL #4   Title sleep with pain decreased >/= 75%   Time 6   Period Weeks   Status New               Plan - 07/23/14 7824    Clinical Impression Statement Unsure pt is doing exercises at home. She continually speaks of great stressers in her life as a barrier. She reports she is going to try and get to the gym and do some pool exerciss.     Pt will benefit from skilled therapeutic intervention in order to improve on the following deficits Decreased range of motion;Difficulty walking;Decreased endurance;Increased fascial restricitons;Decreased activity tolerance;Increased muscle spasms;Pain;Decreased strength;Decreased  mobility   PT Frequency 2x / week   PT  Duration 6 weeks   PT Treatment/Interventions Moist Heat;Therapeutic activities;Patient/family education;Therapeutic exercise;Manual techniques;Cryotherapy;Neuromuscular re-education;Electrical Stimulation   PT Next Visit Plan Continue with core/trunk strength, soft tissue work, Engineer, building services and Agree with Plan of Care Patient        Problem List Patient Active Problem List   Diagnosis Date Noted  . Abdominal pain 04/06/2014  . Adjustment disorder with mixed anxiety and depressed mood 04/27/2013  . Generalized anxiety disorder 04/26/2013  . Abdominal pain, left upper quadrant 03/29/2013  . Depression 12/28/2012  . History of prescription drug abuse 12/09/2012  . Acute encephalopathy 10/03/2012  . Other and unspecified postsurgical nonabsorption 05/19/2012  . Follow-up examination, following other surgery 05/19/2012  . Marginal ulcer 04/15/2012  . 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 LUQ abdominal pain 03/12/2012  . Diabetes mellitus 01/19/2012  . OSA on CPAP 01/19/2012  . History of migraine 01/19/2012  . Morbid obesity 08/20/2011  . Chest pain 07/28/2011  . Hemiplegia affecting left nondominant side 07/28/2011  . 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 Conversion disorder 03/10/2011  . Abdominal pain 03/10/2011  . Diarrhea 03/10/2011    Timmia Cogburn, PTA 07/23/2014, 9:29 AM  [facility] [address] [place], [place], [address] Phone: [phone]   Fax:  [phone]

## 2014-07-25 ENCOUNTER — Encounter: Payer: Self-pay | Admitting: Physical Therapy

## 2014-07-25 ENCOUNTER — Ambulatory Visit: Payer: BLUE CROSS/BLUE SHIELD | Admitting: Physical Therapy

## 2014-07-25 DIAGNOSIS — M545 Low back pain: Secondary | ICD-10-CM | POA: Diagnosis not present

## 2014-07-25 NOTE — Therapy (Signed)
[facility] [address] [place], [place], [address] Phone: [phone]   Fax:  [phone]  Physical Therapy Treatment  Patient Details  Name: [name] MRN: [id number] Date of Birth: [birth year] Referring Provider:  Vernie Shanks, MD  Encounter Date: 07/25/2014      PT End of Session - 07/25/14 0917    Visit Number 7   Date for PT Re-Evaluation 08/10/14   PT Start Time 0841   PT Stop Time 0935   PT Time Calculation (min) 54 min   Activity Tolerance Patient limited by fatigue   Behavior During Therapy Anxious      Past Medical History  Diagnosis Date  . Depression   . Anxiety   . Diabetes mellitus   . GERD (gastroesophageal reflux disease)   . ADD (attention deficit disorder)   . Hyperlipemia   . Herniated disc   . Allergy     Seasonal  . Diabetic neuropathy   . Shortness of breath     with exertion   . Sleep apnea     obsructive settings at 13   . Heart murmur     asa child  . Seizures     hx of / month ago - dehydration   . 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 Degenerative disc disease   . Annular disc tear   . Retrolisthesis     L4 and L5  . Bulging disc   . Chronic pain 04/20/13    Medication prescribed by Vira Blanco, M.D.    Past Surgical History  Procedure Laterality Date  . Cholecystectomy    . Cesarean section    . Tonsillectomy    . Laporoscopy  2007  . Breath tek h pylori  09/11/2011    Procedure: BREATH TEK H PYLORI;  Surgeon: Edward Jolly, MD;  Location: [facility];  Service: General;  Laterality: N/A;  . 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 Bowel resection  11/30/2011    Procedure: SMALL BOWEL RESECTION;  Surgeon: Edward Jolly, MD;  Location: [facility];  Service: General;;  .  Esophagogastroduodenoscopy  03/14/2012    Procedure: ESOPHAGOGASTRODUODENOSCOPY (EGD);  Surgeon: Shann Medal, MD;  Location: [facility];  Service: General;  Laterality: N/A;  . Esophagogastroduodenoscopy  04/25/2012    Procedure: ESOPHAGOGASTRODUODENOSCOPY (EGD);  Surgeon: Shann Medal, MD;  Location: [facility];  Service: General;  Laterality: N/A;  . Esophagogastroduodenoscopy N/A 03/31/2013    Procedure: ESOPHAGOGASTRODUODENOSCOPY (EGD);  Surgeon: Shann Medal, MD;  Location: [facility];  Service: General;  Laterality: N/A;  . Esophagogastroduodenoscopy N/A 04/09/2014    Procedure: ESOPHAGOGASTRODUODENOSCOPY (EGD);  Surgeon: Alphonsa Overall, MD;  Location: [facility];  Service: General;  Laterality: N/A;    There were no vitals filed for this visit.  Visit Diagnosis:  Left low back pain, with sciatica presence unspecified      Subjective Assessment - 07/25/14 0843    Subjective Not much different from last time.   Currently in Pain? Other (Comment)  No formal pain assesssment bc we are going to try and not focus on pain but more movement.  [place] Adult PT Treatment/Exercise - 07/25/14 0001    Lumbar Exercises: Aerobic   Tread Mill 2.1 mph x 10 min   Lumbar Exercises: Supine   Ab Set 10 reps  Ball squeeze with TA    Heel Slides 10 reps   Bent Knee Raise 10 reps   Moist Heat Therapy   Number Minutes Moist Heat 15 Minutes   Moist Heat Location --  BIl lumosacral   Electrical Stimulation   Electrical Stimulation Location Bil lumbar   Electrical Stimulation Action IFC   Electrical Stimulation Parameters 15   Electrical Stimulation Goals Pain                  PT Short Term Goals - 07/25/14 0919    PT SHORT TERM GOAL #1   Title understand correct body mechanics with home tasks   Time 3   Period Weeks   Status Achieved   PT SHORT TERM GOAL #2   Title pain with sitting decreased >/= 25%   Time 3   Period Weeks    Status On-going  Incocnsistent, but sitting position better than a lot of moving/doing activity.   PT SHORT TERM GOAL #3   Title pain with home activities decreased >/= 25%   Time 3   Period Weeks   Status On-going  Not much change   PT SHORT TERM GOAL #4   Title pain with sleeping decreased >/= 25%   Time 3   Period Weeks   Status On-going  Nothing consistently better           PT Long Term Goals - 06/29/14 1005    PT LONG TERM GOAL #1   Title return to gym program at the [facility] with correct body mechanics   Time 6   Period Weeks   Status New   PT LONG TERM GOAL #2   Title sit with pain decreased >/= 75%   Time 6   Period Weeks   Status New   PT LONG TERM GOAL #3   Title perform daily activities with pain decreased >/= 75%   Time 6   Period Weeks   Status New   PT LONG TERM GOAL #4   Title sleep with pain decreased >/= 75%   Time 6   Period Weeks   Status New               Plan - 07/25/14 3007    Clinical Impression Statement Pt has not gone to gym but does appear to perform her stabilization exerciss in the clinic with better control and form. Pain unchanged. Sees MD tomorrow.   Pt will benefit from skilled therapeutic intervention in order to improve on the following deficits Decreased range of motion;Difficulty walking;Decreased endurance;Increased fascial restricitons;Decreased activity tolerance;Increased muscle spasms;Pain;Decreased strength;Decreased mobility   Rehab Potential Excellent   PT Frequency 2x / week   PT Duration 6 weeks   PT Treatment/Interventions Moist Heat;Therapeutic activities;Patient/family education;Therapeutic exercise;Manual techniques;Cryotherapy;Neuromuscular re-education;Electrical Stimulation   PT Next Visit Plan Continue with core/trunk strength, soft tissue work, Engineer, building services and Agree with Plan of Care Patient        Problem List Patient Active Problem List   Diagnosis Date Noted  . Abdominal pain 04/06/2014   . Adjustment disorder with mixed anxiety and depressed mood 04/27/2013  . Generalized anxiety disorder 04/26/2013  . Abdominal pain, left upper quadrant 03/29/2013  . Depression 12/28/2012  . History of prescription drug abuse 12/09/2012  . Acute  encephalopathy 10/03/2012  . Other and unspecified postsurgical nonabsorption 05/19/2012  . Follow-up examination, following other surgery 05/19/2012  . Marginal ulcer 04/15/2012  . 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 LUQ abdominal pain 03/12/2012  . Diabetes mellitus 01/19/2012  . OSA on CPAP 01/19/2012  . History of migraine 01/19/2012  . Morbid obesity 08/20/2011  . Chest pain 07/28/2011  . Hemiplegia affecting left nondominant side 07/28/2011  . 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 Conversion disorder 03/10/2011  . Abdominal pain 03/10/2011  . Diarrhea 03/10/2011     Myrene Galas, PTA 07/25/2014 9:22 AM  [facility] [address] [place], [place], [address] Phone: [phone]   Fax:  [phone]

## 2014-07-27 DIAGNOSIS — R222 Localized swelling, mass and lump, trunk: Secondary | ICD-10-CM | POA: Diagnosis not present

## 2014-08-01 ENCOUNTER — Ambulatory Visit: Payer: BLUE CROSS/BLUE SHIELD | Admitting: Physical Therapy

## 2014-08-01 ENCOUNTER — Encounter: Payer: Self-pay | Admitting: Physical Therapy

## 2014-08-01 DIAGNOSIS — M545 Low back pain: Secondary | ICD-10-CM

## 2014-08-01 NOTE — Patient Instructions (Signed)
  PNF Strengthening: Resisted   Standing with resistive band around each hand, bring right arm up and away, thumb back. Repeat _10___ times per set. Do _2___ sets per session. Do _1-2___ sessions per day.      Resisted Horizontal Abduction: Bilateral   Sit or stand, tubing in both hands, arms out in front. Keeping arms straight, pinch shoulder blades together and stretch arms out. Repeat _10___ times per set. Do 2____ sets per session. Do _1-2___ sessions per day.                  Scapular Retraction: Elbow Flexion (Standing)   With elbows bent to 90, pinch shoulder blades together and rotate arms out, keeping elbows bent. Repeat _10___ times per set. Do _1___ sets per session. Do many____ sessions per day.    .   

## 2014-08-01 NOTE — Therapy (Signed)
[facility] [address] [place], [place], [address] Phone: [phone]   Fax:  [phone]  Physical Therapy Treatment  Patient Details  Name: [name] MRN: [id number] Date of Birth: 12-08-1975 Referring Provider:  Vernie Shanks, MD  Encounter Date: 08/01/2014      PT End of Session - 08/01/14 0827    Visit Number 8   Date for PT Re-Evaluation 08/10/14   PT Start Time 1275   PT Stop Time 0850   PT Time Calculation (min) 55 min   Activity Tolerance Patient tolerated treatment well   Behavior During Therapy [facility] for tasks assessed/performed      Past Medical History  Diagnosis Date  . Depression   . Anxiety   . Diabetes mellitus   . GERD (gastroesophageal reflux disease)   . ADD (attention deficit disorder)   . Hyperlipemia   . Herniated disc   . Allergy     Seasonal  . Diabetic neuropathy   . Shortness of breath     with exertion   . Sleep apnea     obsructive settings at 13   . Heart murmur     asa child  . Seizures     hx of / month ago - dehydration   . 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 Degenerative disc disease   . Annular disc tear   . Retrolisthesis     L4 and L5  . Bulging disc   . Chronic pain 04/20/13    Medication prescribed by Vira Blanco, M.D.    Past Surgical History  Procedure Laterality Date  . Cholecystectomy    . Cesarean section    . Tonsillectomy    . Laporoscopy  2007  . Breath tek h pylori  09/11/2011    Procedure: BREATH TEK H PYLORI;  Surgeon: Edward Jolly, MD;  Location: [facility];  Service: General;  Laterality: N/A;  . 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 Bowel resection  11/30/2011    Procedure: SMALL BOWEL RESECTION;  Surgeon: Edward Jolly, MD;  Location: [facility];  Service: General;;  . Esophagogastroduodenoscopy  03/14/2012    Procedure: ESOPHAGOGASTRODUODENOSCOPY (EGD);  Surgeon: Shann Medal, MD;  Location: [facility];  Service: General;  Laterality: N/A;  . Esophagogastroduodenoscopy  04/25/2012    Procedure: ESOPHAGOGASTRODUODENOSCOPY (EGD);  Surgeon: Shann Medal, MD;  Location: [facility];  Service: General;  Laterality: N/A;  . Esophagogastroduodenoscopy N/A 03/31/2013    Procedure: ESOPHAGOGASTRODUODENOSCOPY (EGD);  Surgeon: Shann Medal, MD;  Location: [facility];  Service: General;  Laterality: N/A;  . Esophagogastroduodenoscopy N/A 04/09/2014    Procedure: ESOPHAGOGASTRODUODENOSCOPY (EGD);  Surgeon: Alphonsa Overall, MD;  Location: [facility];  Service: General;  Laterality: N/A;    There were no vitals filed for this visit.  Visit Diagnosis:  Left low back pain, with sciatica presence unspecified      Subjective Assessment - 08/01/14 0758    Subjective Saw MD on Friday, she reports the MD "wants the thing on her back removed." She is ok to exercise but not to "overdo."   Currently in Pain? Other (Comment)  See pervious comment  [place] Adult PT Treatment/Exercise - 08/01/14 0001    Lumbar Exercises: Aerobic   Tread Mill 2.6 mph x 10 min   UBE (Upper Arm Bike) Sitting on green ball: L1 2x2   Lumbar Exercises: Seated   Other Seated Lumbar Exercises Yellow band scapular unattached 10 x   Lumbar Exercises: Supine   Bent Knee Raise 10 reps  On foam roll   Other Supine Lumbar Exercises On foam roll; 2 min decompresson   Knee/Hip Exercises: Standing   Rebounder wt shifting 1 min 3 ways each   Moist Heat Therapy   Number Minutes Moist Heat 15 Minutes   Moist Heat Location --  BIl lumosacral   Electrical Stimulation   Electrical Stimulation Location Bil lumbar   Electrical Stimulation Action IFC   Electrical Stimulation Parameters 15   Electrical Stimulation Goals Pain                 PT Education - 08/01/14 [phone]    Education provided Yes   Education Details HEP   Person(s) Educated Patient   Methods Explanation;Demonstration;Verbal cues;Handout   Comprehension Verbalized understanding;Returned demonstration          PT Short Term Goals - 08/01/14 0840    PT SHORT TERM GOAL #1   Title understand correct body mechanics with home tasks   Time 3   Period Weeks   Status Achieved   PT SHORT TERM GOAL #2   Title pain with sitting decreased >/= 25%   Time 3   Period Weeks   Status On-going   PT SHORT TERM GOAL #3   Title pain with home activities decreased >/= 25%   Time 3   Period Weeks   Status On-going   PT SHORT TERM GOAL #4   Title pain with sleeping decreased >/= 25%   Time 3   Period Weeks   Status On-going           PT Long Term Goals - 06/29/14 1005    PT LONG TERM GOAL #1   Title return to gym program at the [facility] with correct body mechanics   Time 6   Period Weeks   Status New   PT LONG TERM GOAL #2   Title sit with pain decreased >/= 75%   Time 6   Period Weeks   Status New   PT LONG TERM GOAL #3   Title perform daily activities with pain decreased >/= 75%   Time 6   Period Weeks   Status New   PT LONG TERM GOAL #4   Title sleep with pain decreased >/= 75%   Time 6   Period Weeks   Status New               Plan - 08/01/14 0932    Clinical Impression Statement Patient tolerated the longest period of exercise to date today. She is feeling less anxious since seeing her MD on Friday about her back.    Pt will benefit from skilled therapeutic intervention in order to improve on the following deficits Decreased range of motion;Difficulty walking;Decreased endurance;Increased fascial restricitons;Decreased activity tolerance;Increased muscle spasms;Pain;Decreased strength;Decreased mobility   Rehab Potential Excellent   PT Frequency 2x / week   PT Duration 6 weeks   PT Treatment/Interventions Moist  Heat;Therapeutic activities;Patient/family education;Therapeutic exercise;Manual techniques;Cryotherapy;Neuromuscular re-education;Electrical Stimulation   PT Next Visit Plan Continue with core/trunk strength,  ESTIM   Consulted and Agree with Plan of Care Patient  Problem List Patient Active Problem List   Diagnosis Date Noted  . Abdominal pain 04/06/2014  . Adjustment disorder with mixed anxiety and depressed mood 04/27/2013  . Generalized anxiety disorder 04/26/2013  . Abdominal pain, left upper quadrant 03/29/2013  . Depression 12/28/2012  . History of prescription drug abuse 12/09/2012  . Acute encephalopathy 10/03/2012  . Other and unspecified postsurgical nonabsorption 05/19/2012  . Follow-up examination, following other surgery 05/19/2012  . Marginal ulcer 04/15/2012  . 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 LUQ abdominal pain 03/12/2012  . Diabetes mellitus 01/19/2012  . OSA on CPAP 01/19/2012  . History of migraine 01/19/2012  . Morbid obesity 08/20/2011  . Chest pain 07/28/2011  . Hemiplegia affecting left nondominant side 07/28/2011  . 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 Conversion disorder 03/10/2011  . Abdominal pain 03/10/2011  . Diarrhea 03/10/2011    Boni Maclellan, PTA 08/01/2014, 8:43 AM  [facility] [address], [place], [place], [address] Phone: [phone]   Fax:  [phone]

## 2014-08-08 ENCOUNTER — Ambulatory Visit: Payer: BLUE CROSS/BLUE SHIELD | Admitting: Physical Therapy

## 2014-08-08 ENCOUNTER — Encounter: Payer: Self-pay | Admitting: Physical Therapy

## 2014-08-08 DIAGNOSIS — M545 Low back pain: Secondary | ICD-10-CM | POA: Diagnosis not present

## 2014-08-08 NOTE — Therapy (Signed)
[facility] [address], [place], [place], [address] Phone: [phone]   Fax:  [phone]  Physical Therapy Treatment  Patient Details  Name: [name] MRN: [id number] Date of Birth: 08-02-1975 Referring Provider:  Vernie Shanks, MD  Encounter Date: 08/08/2014      PT End of Session - 08/08/14 0918    Visit Number 9   Date for PT Re-Evaluation 08/10/14   PT Start Time 0840   PT Stop Time 0935   PT Time Calculation (min) 55 min   Activity Tolerance Patient tolerated treatment well   Behavior During Therapy [facility] for tasks assessed/performed      Past Medical History  Diagnosis Date  . Depression   . Anxiety   . Diabetes mellitus   . GERD (gastroesophageal reflux disease)   . ADD (attention deficit disorder)   . Hyperlipemia   . Herniated disc   . Allergy     Seasonal  . Diabetic neuropathy   . Shortness of breath     with exertion   . Sleep apnea     obsructive settings at 13   . Heart murmur     asa child  . Seizures     hx of / month ago - dehydration   . 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 Degenerative disc disease   . Annular disc tear   . Retrolisthesis     L4 and L5  . Bulging disc   . Chronic pain 04/20/13    Medication prescribed by Vira Blanco, M.D.    Past Surgical History  Procedure Laterality Date  . Cholecystectomy    . Cesarean section    . Tonsillectomy    . Laporoscopy  2007  . Breath tek h pylori  09/11/2011    Procedure: BREATH TEK H PYLORI;  Surgeon: Edward Jolly, MD;  Location: [facility];  Service: General;  Laterality: N/A;  . 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 Bowel resection  11/30/2011    Procedure: SMALL BOWEL RESECTION;  Surgeon: Edward Jolly, MD;  Location: [facility];  Service: General;;  . Esophagogastroduodenoscopy  03/14/2012    Procedure: ESOPHAGOGASTRODUODENOSCOPY (EGD);  Surgeon: Shann Medal, MD;  Location: [facility];  Service: General;  Laterality: N/A;  . Esophagogastroduodenoscopy  04/25/2012    Procedure: ESOPHAGOGASTRODUODENOSCOPY (EGD);  Surgeon: Shann Medal, MD;  Location: [facility];  Service: General;  Laterality: N/A;  . Esophagogastroduodenoscopy N/A 03/31/2013    Procedure: ESOPHAGOGASTRODUODENOSCOPY (EGD);  Surgeon: Shann Medal, MD;  Location: [facility];  Service: General;  Laterality: N/A;  . Esophagogastroduodenoscopy N/A 04/09/2014    Procedure: ESOPHAGOGASTRODUODENOSCOPY (EGD);  Surgeon: Alphonsa Overall, MD;  Location: [facility];  Service: General;  Laterality: N/A;    There were no vitals filed for this visit.  Visit Diagnosis:  Left low back pain, with sciatica presence unspecified      Subjective Assessment - 08/08/14 0842    Subjective Doing good, just busy   Currently in Pain? --  Continue to not focus on pain so much as pt functions better when we focus on movement.    Multiple Pain Sites No  [place] Adult PT Treatment/Exercise - 08/08/14 0001    Lumbar Exercises: Aerobic   Tread Mill 2.5 mph x 10 min   UBE (Upper Arm Bike) Sitting on ball 3x3   Lumbar Exercises: Standing   Scapular Retraction Strengthening;Both;15 reps   Theraband Level (Scapular Retraction) --  20#   Scapular Retraction Limitations VC on relaxing upper traps   Lumbar Exercises: Supine   Other Supine Lumbar Exercises On foam roll; 2 min decompresson   Other Supine Lumbar Exercises On roll; horizontal abd 2x 10 with TA contracted   Moist Heat Therapy   Number Minutes Moist Heat 15 Minutes   Moist Heat Location --  BIl lumosacral   Electrical Stimulation   Electrical Stimulation Location Bil lumbar   Electrical Stimulation Action IFC   Electrical Stimulation Parameters 15   Electrical  Stimulation Goals Pain                  PT Short Term Goals - 08/08/14 0854    PT SHORT TERM GOAL #1   Title understand correct body mechanics with home tasks   Time 3   Period Weeks   Status Achieved   PT SHORT TERM GOAL #2   Title pain with sitting decreased >/= 25%   Time 3   Period Weeks   Status On-going   PT SHORT TERM GOAL #3   Title pain with home activities decreased >/= 25%   Time 3   Period Weeks   Status On-going  No change   PT SHORT TERM GOAL #4   Title pain with sleeping decreased >/= 25%   Time 3   Period Weeks   Status On-going           PT Long Term Goals - 06/29/14 1005    PT LONG TERM GOAL #1   Title return to gym program at the [facility] with correct body mechanics   Time 6   Period Weeks   Status New   PT LONG TERM GOAL #2   Title sit with pain decreased >/= 75%   Time 6   Period Weeks   Status New   PT LONG TERM GOAL #3   Title perform daily activities with pain decreased >/= 75%   Time 6   Period Weeks   Status New   PT LONG TERM GOAL #4   Title sleep with pain decreased >/= 75%   Time 6   Period Weeks   Status New               Plan - 08/08/14 0919    Clinical Impression Statement Patient did 40 min of exercise today without pain increasing. No goals met secondary to pain levels unchanging per pt.    Pt will benefit from skilled therapeutic intervention in order to improve on the following deficits Decreased range of motion;Difficulty walking;Decreased endurance;Increased fascial restricitons;Decreased activity tolerance;Increased muscle spasms;Pain;Decreased strength;Decreased mobility   PT Frequency 2x / week   PT Duration 6 weeks   PT Treatment/Interventions Moist Heat;Therapeutic activities;Patient/family education;Therapeutic exercise;Manual techniques;Cryotherapy;Neuromuscular re-education;Electrical Stimulation   PT Home Exercise Plan back stabilization in neutral, FOTO, possible d/c., plan to discuss with PT.    Consulted and Agree with Plan of Care Patient        Problem List Patient Active Problem List   Diagnosis Date Noted  . Abdominal pain 04/06/2014  . Adjustment disorder with mixed anxiety and depressed mood 04/27/2013  . Generalized anxiety disorder 04/26/2013  . Abdominal pain, left  upper quadrant 03/29/2013  . Depression 12/28/2012  . History of prescription drug abuse 12/09/2012  . Acute encephalopathy 10/03/2012  . Other and unspecified postsurgical nonabsorption 05/19/2012  . Follow-up examination, following other surgery 05/19/2012  . Marginal ulcer 04/15/2012  . 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 LUQ abdominal pain 03/12/2012  . Diabetes mellitus 01/19/2012  . OSA on CPAP 01/19/2012  . History of migraine 01/19/2012  . Morbid obesity 08/20/2011  . Chest pain 07/28/2011  . Hemiplegia affecting left nondominant side 07/28/2011  . 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 Conversion disorder 03/10/2011  . Abdominal pain 03/10/2011  . Diarrhea 03/10/2011    Laura-Lee Villegas, PTA 08/08/2014, 9:21 AM  [facility] [address] [place], [place], [address] Phone: [phone]   Fax:  [phone]

## 2014-08-10 ENCOUNTER — Encounter: Payer: Self-pay | Admitting: Physical Therapy

## 2014-08-10 ENCOUNTER — Ambulatory Visit: Payer: BLUE CROSS/BLUE SHIELD | Admitting: Physical Therapy

## 2014-08-10 DIAGNOSIS — G43709 Chronic migraine without aura, not intractable, without status migrainosus: Secondary | ICD-10-CM | POA: Diagnosis not present

## 2014-08-10 DIAGNOSIS — M545 Low back pain: Secondary | ICD-10-CM | POA: Diagnosis not present

## 2014-08-10 NOTE — Therapy (Addendum)
[facility] [address], [place], [place], [address] Phone: [phone]   Fax:  [phone]  Physical Therapy Treatment  Patient Details  Name: [name] MRN: [id number] Date of Birth: 03-24-1975 Referring Provider:  Vernie Shanks, MD  Encounter Date: 08/10/2014      PT End of Session - 08/10/14 0858    Visit Number 10   Date for PT Re-Evaluation 08/10/14   PT Start Time 0841   PT Stop Time 0940   PT Time Calculation (min) 59 min   Activity Tolerance Patient tolerated treatment well   Behavior During Therapy [facility] for tasks assessed/performed      Past Medical History  Diagnosis Date  . Depression   . Anxiety   . Diabetes mellitus   . GERD (gastroesophageal reflux disease)   . ADD (attention deficit disorder)   . Hyperlipemia   . Herniated disc   . Allergy     Seasonal  . Diabetic neuropathy   . Shortness of breath     with exertion   . Sleep apnea     obsructive settings at 13   . Heart murmur     asa child  . Seizures     hx of / month ago - dehydration   . 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 Degenerative disc disease   . Annular disc tear   . Retrolisthesis     L4 and L5  . Bulging disc   . Chronic pain 04/20/13    Medication prescribed by Vira Blanco, M.D.    Past Surgical History  Procedure Laterality Date  . Cholecystectomy    . Cesarean section    . Tonsillectomy    . Laporoscopy  2007  . Breath tek h pylori  09/11/2011    Procedure: BREATH TEK H PYLORI;  Surgeon: Edward Jolly, MD;  Location: [facility];  Service: General;  Laterality: N/A;  . 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 Bowel resection  11/30/2011    Procedure: SMALL BOWEL RESECTION;  Surgeon: Edward Jolly, MD;  Location: [facility];  Service: General;;  . Esophagogastroduodenoscopy  03/14/2012    Procedure: ESOPHAGOGASTRODUODENOSCOPY (EGD);  Surgeon: Shann Medal, MD;  Location: [facility];  Service: General;  Laterality: N/A;  . Esophagogastroduodenoscopy  04/25/2012    Procedure: ESOPHAGOGASTRODUODENOSCOPY (EGD);  Surgeon: Shann Medal, MD;  Location: [facility];  Service: General;  Laterality: N/A;  . Esophagogastroduodenoscopy N/A 03/31/2013    Procedure: ESOPHAGOGASTRODUODENOSCOPY (EGD);  Surgeon: Shann Medal, MD;  Location: [facility];  Service: General;  Laterality: N/A;  . Esophagogastroduodenoscopy N/A 04/09/2014    Procedure: ESOPHAGOGASTRODUODENOSCOPY (EGD);  Surgeon: Alphonsa Overall, MD;  Location: [facility];  Service: General;  Laterality: N/A;    There were no vitals filed for this visit.  Visit Diagnosis:  Left low back pain, with sciatica presence unspecified      Subjective Assessment - 08/10/14 0842    Subjective Had horrible HA yesterday so he did not get out of bed. Getting botox for that today. Having "mass" on low back surgically removed 09/05/14.   Currently in Pain? Yes   Pain Score 8    Pain Location Back   Pain Orientation Lower;Mid   Pain Descriptors / Indicators  Aching;Burning;Discomfort;Sharp   Pain Type Chronic pain   Aggravating Factors  Overdoing, or nothing it just hurts all the time.   Pain Relieving Factors Nothing consistently   Multiple Pain Sites No            [facility] PT Assessment - 08/10/14 0001    Observation/Other Assessments   Focus on Therapeutic Outcomes (FOTO)  51%CK                     [facility] Adult PT Treatment/Exercise - 08/10/14 0001    Lumbar Exercises: Aerobic   Tread Mill 2.5 mph x 10 min   UBE (Upper Arm Bike) Sitting on ball 3x3   Lumbar Exercises: Supine   Ab Set 10 reps   AB Set Limitations On foam roll   Other Supine Lumbar Exercises On foam roll; 2 min decompresson   Other Supine Lumbar Exercises On roll yellow band  horizontal abd 2x10   Moist Heat Therapy   Number Minutes Moist Heat 15 Minutes   Moist Heat Location --  BIl lumosacral   Electrical Stimulation   Electrical Stimulation Location Bil lumbar   Electrical Stimulation Action IFC   Electrical Stimulation Parameters 15   Electrical Stimulation Goals Pain                  PT Short Term Goals - 08/10/14 7412    PT SHORT TERM GOAL #2   Status --  No significant change   PT SHORT TERM GOAL #3   Status --  No change   PT SHORT TERM GOAL #4   Title pain with sleeping decreased >/= 25%   Time 3   Period Weeks   Status On-going  No change           PT Long Term Goals - 06/29/14 1005    PT LONG TERM GOAL #1   Title return to gym program at the [facility] with correct body mechanics   Time 6   Period Weeks   Status New   PT LONG TERM GOAL #2   Title sit with pain decreased >/= 75%   Time 6   Period Weeks   Status New   PT LONG TERM GOAL #3   Title perform daily activities with pain decreased >/= 75%   Time 6   Period Weeks   Status New   PT LONG TERM GOAL #4   Title sleep with pain decreased >/= 75%   Time 6   Period Weeks   Status New               Plan - 08/10/14 8786    Clinical Impression Statement Patient has not met any pain goals nor had any significant changes in her pain.    Pt will benefit from skilled therapeutic intervention in order to improve on the following deficits Decreased range of motion;Difficulty walking;Decreased endurance;Increased fascial restricitons;Decreased activity tolerance;Increased muscle spasms;Pain;Decreased strength;Decreased mobility   Rehab Potential Excellent   PT Frequency 2x / week   PT Duration 6 weeks   PT Treatment/Interventions Moist Heat;Therapeutic activities;Patient/family education;Therapeutic exercise;Manual techniques;Cryotherapy;Neuromuscular re-education;Electrical Stimulation   PT Next Visit Plan DC to HEP and pt planning on going to gym 1-2 x week. She  will have her surgery  then see what happens to her pain.    Consulted and Agree with Plan of Care Patient        Problem List Patient Active Problem List   Diagnosis Date Noted  .  Abdominal pain 04/06/2014  . Adjustment disorder with mixed anxiety and depressed mood 04/27/2013  . Generalized anxiety disorder 04/26/2013  . Abdominal pain, left upper quadrant 03/29/2013  . Depression 12/28/2012  . History of prescription drug abuse 12/09/2012  . Acute encephalopathy 10/03/2012  . Other and unspecified postsurgical nonabsorption 05/19/2012  . Follow-up examination, following other surgery 05/19/2012  . Marginal ulcer 04/15/2012  . 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 LUQ abdominal pain 03/12/2012  . Diabetes mellitus 01/19/2012  . OSA on CPAP 01/19/2012  . History of migraine 01/19/2012  . Morbid obesity 08/20/2011  . Chest pain 07/28/2011  . Hemiplegia affecting left nondominant side 07/28/2011  . 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 Conversion disorder 03/10/2011  . Abdominal pain 03/10/2011  . Diarrhea 03/10/2011    Evana Runnels, PTA 08/10/2014, 9:28 AM PHYSICAL THERAPY DISCHARGE SUMMARY  Visits from Start of Care: 10  Current functional level related to goals / functional outcomes:See above for goal status.  No significant change in pain since the start of care.     Remaining deficits: Pt with chronic and continued LBP.  Pt has HEP in place and plans to go to the gym for exercise.  Thank you for this referral.     Education / Equipment: HEP, body mechanics education Plan: Patient agrees to discharge.  Patient goals were not met. Patient is being discharged due to lack of progress.  ?????     [facility] [address], [place], [place], [address] Phone: [phone]   Fax:  [phone]

## 2014-09-05 ENCOUNTER — Other Ambulatory Visit: Payer: Self-pay | Admitting: General Surgery

## 2014-09-05 DIAGNOSIS — D179 Benign lipomatous neoplasm, unspecified: Secondary | ICD-10-CM | POA: Diagnosis not present

## 2014-09-05 DIAGNOSIS — D1779 Benign lipomatous neoplasm of other sites: Secondary | ICD-10-CM | POA: Diagnosis not present

## 2014-09-05 DIAGNOSIS — D171 Benign lipomatous neoplasm of skin and subcutaneous tissue of trunk: Secondary | ICD-10-CM | POA: Diagnosis not present

## 2014-11-08 DIAGNOSIS — Z86018 Personal history of other benign neoplasm: Secondary | ICD-10-CM | POA: Diagnosis not present

## 2014-11-08 DIAGNOSIS — D2271 Melanocytic nevi of right lower limb, including hip: Secondary | ICD-10-CM | POA: Diagnosis not present

## 2014-11-08 DIAGNOSIS — D2260 Melanocytic nevi of unspecified upper limb, including shoulder: Secondary | ICD-10-CM | POA: Diagnosis not present

## 2014-11-08 DIAGNOSIS — D2261 Melanocytic nevi of right upper limb, including shoulder: Secondary | ICD-10-CM | POA: Diagnosis not present

## 2014-11-08 DIAGNOSIS — D225 Melanocytic nevi of trunk: Secondary | ICD-10-CM | POA: Diagnosis not present

## 2014-11-08 DIAGNOSIS — D485 Neoplasm of uncertain behavior of skin: Secondary | ICD-10-CM | POA: Diagnosis not present

## 2014-11-08 DIAGNOSIS — D2230 Melanocytic nevi of unspecified part of face: Secondary | ICD-10-CM | POA: Diagnosis not present

## 2014-11-12 DIAGNOSIS — G43709 Chronic migraine without aura, not intractable, without status migrainosus: Secondary | ICD-10-CM | POA: Diagnosis not present

## 2015-02-20 DIAGNOSIS — G43709 Chronic migraine without aura, not intractable, without status migrainosus: Secondary | ICD-10-CM | POA: Diagnosis not present

## 2015-03-27 DIAGNOSIS — M79671 Pain in right foot: Secondary | ICD-10-CM | POA: Diagnosis not present

## 2015-04-18 DIAGNOSIS — R300 Dysuria: Secondary | ICD-10-CM | POA: Diagnosis not present

## 2015-04-19 DIAGNOSIS — Z9884 Bariatric surgery status: Secondary | ICD-10-CM | POA: Diagnosis not present

## 2015-05-21 DIAGNOSIS — B349 Viral infection, unspecified: Secondary | ICD-10-CM | POA: Diagnosis not present

## 2015-05-21 DIAGNOSIS — R52 Pain, unspecified: Secondary | ICD-10-CM | POA: Diagnosis not present

## 2015-05-29 DIAGNOSIS — G43709 Chronic migraine without aura, not intractable, without status migrainosus: Secondary | ICD-10-CM | POA: Diagnosis not present

## 2015-06-16 DIAGNOSIS — Z20818 Contact with and (suspected) exposure to other bacterial communicable diseases: Secondary | ICD-10-CM | POA: Diagnosis not present

## 2015-06-16 DIAGNOSIS — B349 Viral infection, unspecified: Secondary | ICD-10-CM | POA: Diagnosis not present

## 2015-06-18 ENCOUNTER — Ambulatory Visit: Payer: BLUE CROSS/BLUE SHIELD

## 2015-06-18 DIAGNOSIS — M25521 Pain in right elbow: Secondary | ICD-10-CM | POA: Diagnosis not present

## 2015-06-20 DIAGNOSIS — D485 Neoplasm of uncertain behavior of skin: Secondary | ICD-10-CM | POA: Diagnosis not present

## 2015-06-20 DIAGNOSIS — D2230 Melanocytic nevi of unspecified part of face: Secondary | ICD-10-CM | POA: Diagnosis not present

## 2015-06-20 DIAGNOSIS — D225 Melanocytic nevi of trunk: Secondary | ICD-10-CM | POA: Diagnosis not present

## 2015-06-20 DIAGNOSIS — D2271 Melanocytic nevi of right lower limb, including hip: Secondary | ICD-10-CM | POA: Diagnosis not present

## 2015-06-20 DIAGNOSIS — Z86018 Personal history of other benign neoplasm: Secondary | ICD-10-CM | POA: Diagnosis not present

## 2015-06-20 DIAGNOSIS — L853 Xerosis cutis: Secondary | ICD-10-CM | POA: Diagnosis not present

## 2015-06-20 DIAGNOSIS — Z808 Family history of malignant neoplasm of other organs or systems: Secondary | ICD-10-CM | POA: Diagnosis not present

## 2015-06-20 DIAGNOSIS — D2261 Melanocytic nevi of right upper limb, including shoulder: Secondary | ICD-10-CM | POA: Diagnosis not present

## 2015-06-21 DIAGNOSIS — S40021S Contusion of right upper arm, sequela: Secondary | ICD-10-CM | POA: Diagnosis not present

## 2015-06-21 DIAGNOSIS — F4329 Adjustment disorder with other symptoms: Secondary | ICD-10-CM | POA: Diagnosis not present

## 2015-06-27 DIAGNOSIS — Z6830 Body mass index (BMI) 30.0-30.9, adult: Secondary | ICD-10-CM | POA: Diagnosis not present

## 2015-06-27 DIAGNOSIS — Z01419 Encounter for gynecological examination (general) (routine) without abnormal findings: Secondary | ICD-10-CM | POA: Diagnosis not present

## 2015-07-04 DIAGNOSIS — F4329 Adjustment disorder with other symptoms: Secondary | ICD-10-CM | POA: Diagnosis not present

## 2015-07-04 DIAGNOSIS — H6122 Impacted cerumen, left ear: Secondary | ICD-10-CM | POA: Diagnosis not present

## 2015-07-04 DIAGNOSIS — S40021S Contusion of right upper arm, sequela: Secondary | ICD-10-CM | POA: Diagnosis not present

## 2015-07-11 ENCOUNTER — Encounter (HOSPITAL_COMMUNITY): Payer: Self-pay | Admitting: Emergency Medicine

## 2015-07-11 ENCOUNTER — Emergency Department (HOSPITAL_COMMUNITY): Payer: BLUE CROSS/BLUE SHIELD

## 2015-07-11 ENCOUNTER — Emergency Department (HOSPITAL_COMMUNITY)
Admission: EM | Admit: 2015-07-11 | Discharge: 2015-07-12 | Disposition: A | Discharge status: Home / Self Care | Payer: BLUE CROSS/BLUE SHIELD | Attending: Emergency Medicine | Admitting: Emergency Medicine

## 2015-07-11 DIAGNOSIS — Z8679 Personal history of other diseases of the circulatory system: Secondary | ICD-10-CM | POA: Insufficient documentation

## 2015-07-11 DIAGNOSIS — Z8719 Personal history of other diseases of the digestive system: Secondary | ICD-10-CM | POA: Diagnosis not present

## 2015-07-11 DIAGNOSIS — G8929 Other chronic pain: Secondary | ICD-10-CM | POA: Insufficient documentation

## 2015-07-11 DIAGNOSIS — E1140 Type 2 diabetes mellitus with diabetic neuropathy, unspecified: Secondary | ICD-10-CM | POA: Diagnosis not present

## 2015-07-11 DIAGNOSIS — R079 Chest pain, unspecified: Secondary | ICD-10-CM | POA: Insufficient documentation

## 2015-07-11 DIAGNOSIS — I2699 Other pulmonary embolism without acute cor pulmonale: Secondary | ICD-10-CM | POA: Diagnosis not present

## 2015-07-11 DIAGNOSIS — Z9884 Bariatric surgery status: Secondary | ICD-10-CM | POA: Insufficient documentation

## 2015-07-11 DIAGNOSIS — Z79899 Other long term (current) drug therapy: Secondary | ICD-10-CM | POA: Insufficient documentation

## 2015-07-11 DIAGNOSIS — M79661 Pain in right lower leg: Secondary | ICD-10-CM | POA: Diagnosis not present

## 2015-07-11 DIAGNOSIS — Z880 Allergy status to penicillin: Secondary | ICD-10-CM | POA: Diagnosis not present

## 2015-07-11 DIAGNOSIS — Z8659 Personal history of other mental and behavioral disorders: Secondary | ICD-10-CM | POA: Diagnosis not present

## 2015-07-11 DIAGNOSIS — R0602 Shortness of breath: Secondary | ICD-10-CM | POA: Diagnosis not present

## 2015-07-11 DIAGNOSIS — R011 Cardiac murmur, unspecified: Secondary | ICD-10-CM | POA: Insufficient documentation

## 2015-07-11 DIAGNOSIS — M79604 Pain in right leg: Secondary | ICD-10-CM | POA: Diagnosis not present

## 2015-07-11 LAB — CBC
HEMATOCRIT: 40.8 % (ref 36.0–46.0)
HEMOGLOBIN: 13.6 g/dL (ref 12.0–15.0)
MCH: 27 pg (ref 26.0–34.0)
MCHC: 33.3 g/dL (ref 30.0–36.0)
MCV: 81.1 fL (ref 78.0–100.0)
Platelets: 267 10*3/uL (ref 150–400)
RBC: 5.03 MIL/uL (ref 3.87–5.11)
RDW: 12.8 % (ref 11.5–15.5)
WBC: 8.3 10*3/uL (ref 4.0–10.5)

## 2015-07-11 LAB — BASIC METABOLIC PANEL
Anion gap: 7 (ref 5–15)
BUN: 13 mg/dL (ref 6–20)
CALCIUM: 9 mg/dL (ref 8.9–10.3)
CO2: 28 mmol/L (ref 22–32)
CREATININE: 0.73 mg/dL (ref 0.44–1.00)
Chloride: 106 mmol/L (ref 101–111)
GFR calc Af Amer: >60 mL/min (ref >60)
GFR calc non Af Amer: >60 mL/min (ref >60)
Glucose, Bld: 99 mg/dL (ref 65–99)
Potassium: 4.4 mmol/L (ref 3.5–5.1)
SODIUM: 141 mmol/L (ref 135–145)

## 2015-07-11 LAB — I-STAT TROPONIN, ED: Troponin i, poc: 0 ng/mL (ref 0.00–0.08)

## 2015-07-11 MED ORDER — ENOXAPARIN SODIUM 100 MG/ML ~~LOC~~ SOLN
1.0000 mg/kg | Freq: Once | SUBCUTANEOUS | Status: AC
Start: 2015-07-11 — End: 2015-07-12
  Administered 2015-07-12: 85 mg via SUBCUTANEOUS
  Filled 2015-07-11: qty 1

## 2015-07-11 MED ORDER — IOPAMIDOL (ISOVUE-370) INJECTION 76%
100.0000 mL | Freq: Once | INTRAVENOUS | Status: AC | PRN
  Administered 2015-07-11: 100 mL via INTRAVENOUS

## 2015-07-11 NOTE — ED Notes (Signed)
Per CT technician, patient IV blew during CT examination. Hot pack was applied by CT. Patient IV removed when patient returned to treatment room, secured with coband. Patient given juice and snack. Denies additional needs at this time.

## 2015-07-11 NOTE — ED Notes (Signed)
Pt states she came back on a 6 hr car ride from [place]. Having right calf pain and swelling with new bruising across body. Began having right lower rib pain and SOB around 18:00 this afternoon, as well as a headache.

## 2015-07-11 NOTE — Discharge Instructions (Signed)
Go to [facility] at 8 in the morning to have a venous Doppler study performed.  Return here as needed.  CT scan of your chest did not show any signs of blood clot

## 2015-07-11 NOTE — ED Provider Notes (Signed)
CSN: [id number]     Arrival date & time 07/11/15  1821 History   First MD Initiated Contact with Patient 07/11/15 2111     Chief Complaint  Patient presents with  . Shortness of Breath  . Chest Pain  . Leg Swelling     (Consider location/radiation/quality/duration/timing/severity/associated sxs/prior Treatment) HPI  Patient presents to the emergency department with right calf pain and right sided lateral chest pain.  Patient states that she was recently traveling to [place] for the funeral of her grandmother.  The patient states she noticed that she had several bruised areas without injury yesterday.  The patient states that nothing seems to make the condition better or worse.  She states that she has never had any blood clot history in the pastThe patient denies  shortness of breath, headache,blurred vision, neck pain, fever, cough, weakness, numbness, dizziness, anorexia, edema, abdominal pain, nausea, vomiting, diarrhea, rash, back pain, dysuria, hematemesis, bloody stool, near syncope, or syncope.  Patient did not take any medications prior to arrival Past Medical History  Diagnosis Date  . Depression   . Anxiety   . Diabetes mellitus   . GERD (gastroesophageal reflux disease)   . ADD (attention deficit disorder)   . Hyperlipemia   . Herniated disc   . Allergy     Seasonal  . Diabetic neuropathy ([place])   . Shortness of breath     with exertion   . Sleep apnea     obsructive settings at 13   . Heart murmur     asa child  . Seizures ([facility])     hx of / month ago - dehydration   . 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 Degenerative disc disease   . Annular disc tear   . Retrolisthesis     L4 and L5  . Bulging disc   . Chronic pain 04/20/13    Medication prescribed by Vira Blanco, M.D.   Past Surgical History  Procedure Laterality Date  . Cholecystectomy    . Cesarean section    . Tonsillectomy    . Laporoscopy  2007  .  Breath tek h pylori  09/11/2011    Procedure: BREATH TEK H PYLORI;  Surgeon: Edward Jolly, MD;  Location: [facility];  Service: General;  Laterality: N/A;  . 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 Bowel resection  11/30/2011    Procedure: SMALL BOWEL RESECTION;  Surgeon: Edward Jolly, MD;  Location: [facility];  Service: General;;  . Esophagogastroduodenoscopy  03/14/2012    Procedure: ESOPHAGOGASTRODUODENOSCOPY (EGD);  Surgeon: Shann Medal, MD;  Location: [facility];  Service: General;  Laterality: N/A;  . Esophagogastroduodenoscopy  04/25/2012    Procedure: ESOPHAGOGASTRODUODENOSCOPY (EGD);  Surgeon: Shann Medal, MD;  Location: [facility];  Service: General;  Laterality: N/A;  . Esophagogastroduodenoscopy N/A 03/31/2013    Procedure: ESOPHAGOGASTRODUODENOSCOPY (EGD);  Surgeon: Shann Medal, MD;  Location: [facility];  Service: General;  Laterality: N/A;  . Esophagogastroduodenoscopy N/A 04/09/2014    Procedure: ESOPHAGOGASTRODUODENOSCOPY (EGD);  Surgeon: Alphonsa Overall, MD;  Location: [facility];  Service: General;  Laterality: N/A;   Family History  Problem Relation Age of Onset  . Diabetes type II    . Diabetes Mother   . Hypertension Mother   . Depression Mother   .  OCD Mother   . Depression Brother   . Cancer Maternal Aunt     ovarian   . Depression Maternal Aunt   . Diabetes Maternal Aunt   . Cancer Maternal Grandmother     Lung  . COPD Maternal Grandmother   . Cancer Maternal Grandfather     Prostate  . Diabetes Maternal Grandfather   . Hearing loss Maternal Grandfather   . Hypertension Maternal Grandfather   . Stroke Maternal Grandfather   . Cancer Paternal Grandmother     Colon   Social History  Substance Use Topics  . Smoking status: Never Smoker   . Smokeless tobacco: Never Used  . Alcohol Use: No   OB History     No data available     Review of Systems All other systems negative except as documented in the HPI. All pertinent positives and negatives as reviewed in the HPI.   Allergies  Ace inhibitors; Atarax; Cephalosporins; Codeine; Compazine; Decadron; Demerol; Metoclopramide hcl; Morphine and related; Other; Penicillins; Pork-derived products; Prozac; Shellfish-derived products; Stevia glycerite extract; Sulfa antibiotics; Triptans; Zanaflex; Zofran; Dihydroergotamine; and Periactin  Home Medications   Prior to Admission medications   Medication Sig Start Date End Date Taking? Authorizing Provider  CALCIUM-VITAMIN D PO Take 1 tablet by mouth 3 (three) times daily.    Yes Historical Provider, MD  Cyanocobalamin (VITAMIN B-12 PO) Take 1 tablet by mouth once a week.   Yes Historical Provider, MD  ferrous sulfate 325 (65 FE) MG tablet Take 325 mg by mouth daily with breakfast.   Yes Historical Provider, MD  Multiple Vitamin (MULTIVITAMIN WITH MINERALS) TABS tablet Take 1 tablet by mouth 2 (two) times daily.    Yes Historical Provider, MD  naproxen sodium (ANAPROX) 220 MG tablet Take 220 mg by mouth 2 (two) times daily as needed (pain).   Yes Historical Provider, MD  OnabotulinumtoxinA (BOTOX IJ) Inject 1 each as directed every 3 (three) months. For migraines   Yes Historical Provider, MD  EPINEPHrine (EPI-PEN) 0.3 mg/0.3 mL SOAJ injection Inject 0.3 mg into the muscle daily as needed (allergic reaction).     Historical Provider, MD  LORazepam (ATIVAN) 0.5 MG tablet Take 2 tablets (1 mg total) by mouth 2 (two) times daily. Patient not taking: Reported on 04/07/2014 08/03/13   Norma Fredrickson, MD  pantoprazole (PROTONIX) 40 MG tablet Take 1 tablet (40 mg total) by mouth daily. Patient not taking: Reported on 06/29/2014 04/10/14   Excell Seltzer, MD  sucralfate (CARAFATE) 1 GM/10ML suspension Take 10 mLs (1 g total) by mouth 4 (four) times daily -  with meals and at bedtime. Patient not taking: Reported on  06/29/2014 04/10/14   Excell Seltzer, MD  traZODone (DESYREL) 100 MG tablet Take 2 tablets (200 mg total) by mouth at bedtime. Patient not taking: Reported on 04/07/2014 08/07/13   Norma Fredrickson, MD   BP 110/72 mmHg  Pulse 71  Temp(Src) 98.9 F (37.2 C) (Oral)  Resp 21  SpO2 100% Physical Exam  Constitutional: She is oriented to person, place, and time. She appears well-developed and well-nourished. No distress.  HENT:  Head: Normocephalic and atraumatic.  Mouth/Throat: Oropharynx is clear and moist.  Eyes: Pupils are equal, round, and reactive to light.  Neck: Normal range of motion. Neck supple.  Cardiovascular: Normal rate, regular rhythm and normal heart sounds.  Exam reveals no gallop and no friction rub.   No murmur heard. Pulmonary/Chest: Effort normal and breath sounds normal. No respiratory distress. She has no  wheezes.  Abdominal: Soft. Bowel sounds are normal. She exhibits no distension. There is no tenderness.  Musculoskeletal:       Right lower leg: She exhibits tenderness. She exhibits no bony tenderness, no swelling, no edema and no deformity.       Legs: Neurological: She is alert and oriented to person, place, and time. She exhibits normal muscle tone. Coordination normal.  Skin: Skin is warm and dry. No rash noted. No erythema.  Psychiatric: She has a normal mood and affect. Her behavior is normal.  Nursing note and vitals reviewed.   ED Course  Procedures (including critical care time) Labs Review Labs Reviewed  BASIC METABOLIC PANEL  CBC  I-STAT [place], ED    Imaging Review Dg Chest 2 View  07/11/2015  CLINICAL DATA:  Chest pain and shortness of breath EXAM: CHEST  2 VIEW COMPARISON:  03/27/2013 FINDINGS: Normal heart size and mediastinal contours. No acute infiltrate or edema. No effusion or pneumothorax. Cholecystectomy clips. No acute osseous findings. IMPRESSION: No active cardiopulmonary disease. Electronically Signed   By: Monte Fantasia M.D.    On: 07/11/2015 18:59   Ct Angio Chest Pe W/cm &/or Wo Cm  07/11/2015  CLINICAL DATA:  Chest pain and shortness of breath. Right calf pain. Recent 6 hour car ride. EXAM: CT ANGIOGRAPHY CHEST WITH CONTRAST TECHNIQUE: Multidetector CT imaging of the chest was performed using the standard protocol during bolus administration of intravenous contrast. Multiplanar CT image reconstructions and MIPs were obtained to evaluate the vascular anatomy. CONTRAST:  60 cc Isovue 370 IV. During contrast injection patient's reported arm was "bubbling" in the injection was discontinued. COMPARISON:  Radiographs earlier this date.  Chest CT 09/09/2011 FINDINGS: Multifocal patient motion, severe in the apices and upper lungs, moderate in the lower lungs. Contrast bolus timing is suboptimal. No filling defects within the main or proximal lobar pulmonary arteries. The segmental and subsegmental branches cannot be assessed secondary to contrast bolus timing and patient motion. No right heart strain. Normal caliber thoracic aorta without dissection. Heart is normal in size. No pleural or pericardial effusion. Motion limited exam of pulmonary parenchyma. No pulmonary infarct or consolidation. Minimal dependent atelectasis. No pulmonary edema. No definite acute abnormality in the included upper abdomen, motion limited. Patient is post gastric bypass. No definite acute osseous abnormality. Bone island within the mid thoracic spine is unchanged. Review of the MIP images confirms the above findings. IMPRESSION: 1. Suboptimal evaluation for pulmonary embolus secondary to contrast bolus timing, and more significantly patient motion. No embolus to the proximal lobar level. Segmental and subsegmental branches cannot be assessed. 2. No definite acute intrathoracic process allowing for motion. Electronically Signed   By: Jeb Levering M.D.   On: 07/11/2015 22:59   I have personally reviewed and evaluated these images and lab results as part of  my medical decision-making.   EKG Interpretation None      MDM   Final diagnoses:  Calf pain, right  Chest pain, unspecified chest pain type    Patient had negative CT of her chest for PE is given Lovenox and referred to [facility] cone current outpatient lower extremity vascular ultrasound.  Patient agrees the plan and all questions were answered  Dalia Heading, PA-C 07/12/15 0037  Leo Grosser, MD 07/12/15 [phone]

## 2015-07-11 NOTE — ED Notes (Signed)
UNABLE TO COLLECT LABS PATIENT IN XRAY

## 2015-07-12 ENCOUNTER — Ambulatory Visit (HOSPITAL_COMMUNITY)
Admission: RE | Admit: 2015-07-12 | Discharge: 2015-07-12 | Disposition: A | Discharge status: Home / Self Care | Payer: BLUE CROSS/BLUE SHIELD | Source: Ambulatory Visit | Attending: Emergency Medicine | Admitting: Emergency Medicine

## 2015-07-12 DIAGNOSIS — E1140 Type 2 diabetes mellitus with diabetic neuropathy, unspecified: Secondary | ICD-10-CM | POA: Insufficient documentation

## 2015-07-12 DIAGNOSIS — F419 Anxiety disorder, unspecified: Secondary | ICD-10-CM | POA: Diagnosis not present

## 2015-07-12 DIAGNOSIS — M79661 Pain in right lower leg: Secondary | ICD-10-CM | POA: Insufficient documentation

## 2015-07-12 DIAGNOSIS — F329 Major depressive disorder, single episode, unspecified: Secondary | ICD-10-CM | POA: Insufficient documentation

## 2015-07-12 DIAGNOSIS — K219 Gastro-esophageal reflux disease without esophagitis: Secondary | ICD-10-CM | POA: Insufficient documentation

## 2015-07-12 DIAGNOSIS — R079 Chest pain, unspecified: Secondary | ICD-10-CM | POA: Diagnosis not present

## 2015-07-12 DIAGNOSIS — E785 Hyperlipidemia, unspecified: Secondary | ICD-10-CM | POA: Diagnosis not present

## 2015-07-12 DIAGNOSIS — M79609 Pain in unspecified limb: Secondary | ICD-10-CM | POA: Diagnosis not present

## 2015-07-12 NOTE — Progress Notes (Signed)
Preliminary results by tech - Right lower ext. Venous duplex completed. Negative for deep and superficial vein thrombosis . Oda Cogan, BS, RDMS, RVT

## 2015-07-26 DIAGNOSIS — D225 Melanocytic nevi of trunk: Secondary | ICD-10-CM | POA: Diagnosis not present

## 2015-07-26 DIAGNOSIS — D485 Neoplasm of uncertain behavior of skin: Secondary | ICD-10-CM | POA: Diagnosis not present

## 2015-07-26 DIAGNOSIS — D2261 Melanocytic nevi of right upper limb, including shoulder: Secondary | ICD-10-CM | POA: Diagnosis not present

## 2015-09-13 DIAGNOSIS — M545 Low back pain: Secondary | ICD-10-CM | POA: Diagnosis not present

## 2015-09-13 DIAGNOSIS — G43909 Migraine, unspecified, not intractable, without status migrainosus: Secondary | ICD-10-CM | POA: Diagnosis not present

## 2015-09-30 DIAGNOSIS — G43709 Chronic migraine without aura, not intractable, without status migrainosus: Secondary | ICD-10-CM | POA: Diagnosis not present

## 2015-10-18 DIAGNOSIS — F4329 Adjustment disorder with other symptoms: Secondary | ICD-10-CM | POA: Diagnosis not present

## 2015-10-18 DIAGNOSIS — G43909 Migraine, unspecified, not intractable, without status migrainosus: Secondary | ICD-10-CM | POA: Diagnosis not present

## 2015-11-12 DIAGNOSIS — G43909 Migraine, unspecified, not intractable, without status migrainosus: Secondary | ICD-10-CM | POA: Diagnosis not present

## 2015-11-12 DIAGNOSIS — F4329 Adjustment disorder with other symptoms: Secondary | ICD-10-CM | POA: Diagnosis not present

## 2015-11-28 DIAGNOSIS — F4329 Adjustment disorder with other symptoms: Secondary | ICD-10-CM | POA: Diagnosis not present

## 2015-12-12 DIAGNOSIS — Z23 Encounter for immunization: Secondary | ICD-10-CM | POA: Diagnosis not present

## 2015-12-12 DIAGNOSIS — H6122 Impacted cerumen, left ear: Secondary | ICD-10-CM | POA: Diagnosis not present

## 2015-12-12 DIAGNOSIS — G5623 Lesion of ulnar nerve, bilateral upper limbs: Secondary | ICD-10-CM | POA: Diagnosis not present

## 2015-12-12 DIAGNOSIS — F4329 Adjustment disorder with other symptoms: Secondary | ICD-10-CM | POA: Diagnosis not present

## 2015-12-26 DIAGNOSIS — G5623 Lesion of ulnar nerve, bilateral upper limbs: Secondary | ICD-10-CM | POA: Diagnosis not present

## 2015-12-26 DIAGNOSIS — F4329 Adjustment disorder with other symptoms: Secondary | ICD-10-CM | POA: Diagnosis not present

## 2016-01-02 DIAGNOSIS — D225 Melanocytic nevi of trunk: Secondary | ICD-10-CM | POA: Diagnosis not present

## 2016-01-02 DIAGNOSIS — Z86018 Personal history of other benign neoplasm: Secondary | ICD-10-CM | POA: Diagnosis not present

## 2016-01-02 DIAGNOSIS — G43709 Chronic migraine without aura, not intractable, without status migrainosus: Secondary | ICD-10-CM | POA: Diagnosis not present

## 2016-01-02 DIAGNOSIS — D485 Neoplasm of uncertain behavior of skin: Secondary | ICD-10-CM | POA: Diagnosis not present

## 2016-01-02 DIAGNOSIS — D2271 Melanocytic nevi of right lower limb, including hip: Secondary | ICD-10-CM | POA: Diagnosis not present

## 2016-01-02 DIAGNOSIS — D2261 Melanocytic nevi of right upper limb, including shoulder: Secondary | ICD-10-CM | POA: Diagnosis not present

## 2016-01-02 DIAGNOSIS — D224 Melanocytic nevi of scalp and neck: Secondary | ICD-10-CM | POA: Diagnosis not present

## 2016-01-02 DIAGNOSIS — D2230 Melanocytic nevi of unspecified part of face: Secondary | ICD-10-CM | POA: Diagnosis not present

## 2016-01-02 DIAGNOSIS — Z23 Encounter for immunization: Secondary | ICD-10-CM | POA: Diagnosis not present

## 2016-01-02 DIAGNOSIS — Z808 Family history of malignant neoplasm of other organs or systems: Secondary | ICD-10-CM | POA: Diagnosis not present

## 2016-01-13 DIAGNOSIS — R350 Frequency of micturition: Secondary | ICD-10-CM | POA: Diagnosis not present

## 2016-01-13 DIAGNOSIS — N390 Urinary tract infection, site not specified: Secondary | ICD-10-CM | POA: Diagnosis not present

## 2016-01-16 DIAGNOSIS — N390 Urinary tract infection, site not specified: Secondary | ICD-10-CM | POA: Diagnosis not present

## 2016-01-16 DIAGNOSIS — E119 Type 2 diabetes mellitus without complications: Secondary | ICD-10-CM | POA: Diagnosis not present

## 2016-01-16 DIAGNOSIS — F4329 Adjustment disorder with other symptoms: Secondary | ICD-10-CM | POA: Diagnosis not present

## 2016-01-16 DIAGNOSIS — M5441 Lumbago with sciatica, right side: Secondary | ICD-10-CM | POA: Diagnosis not present

## 2016-01-16 DIAGNOSIS — R319 Hematuria, unspecified: Secondary | ICD-10-CM | POA: Diagnosis not present

## 2016-01-20 DIAGNOSIS — L988 Other specified disorders of the skin and subcutaneous tissue: Secondary | ICD-10-CM | POA: Diagnosis not present

## 2016-01-21 DIAGNOSIS — D485 Neoplasm of uncertain behavior of skin: Secondary | ICD-10-CM | POA: Diagnosis not present

## 2016-01-27 DIAGNOSIS — N3000 Acute cystitis without hematuria: Secondary | ICD-10-CM | POA: Diagnosis not present

## 2016-01-30 ENCOUNTER — Emergency Department (HOSPITAL_COMMUNITY)
Admission: EM | Admit: 2016-01-30 | Discharge: 2016-01-30 | Disposition: A | Discharge status: Home / Self Care | Payer: Managed Care, Other (non HMO) | Attending: Emergency Medicine | Admitting: Emergency Medicine

## 2016-01-30 ENCOUNTER — Encounter (HOSPITAL_COMMUNITY): Payer: Self-pay

## 2016-01-30 ENCOUNTER — Emergency Department (HOSPITAL_COMMUNITY): Payer: Managed Care, Other (non HMO)

## 2016-01-30 DIAGNOSIS — R109 Unspecified abdominal pain: Secondary | ICD-10-CM | POA: Diagnosis not present

## 2016-01-30 DIAGNOSIS — F909 Attention-deficit hyperactivity disorder, unspecified type: Secondary | ICD-10-CM | POA: Diagnosis not present

## 2016-01-30 DIAGNOSIS — Z791 Long term (current) use of non-steroidal anti-inflammatories (NSAID): Secondary | ICD-10-CM | POA: Insufficient documentation

## 2016-01-30 DIAGNOSIS — Z79899 Other long term (current) drug therapy: Secondary | ICD-10-CM | POA: Diagnosis not present

## 2016-01-30 DIAGNOSIS — N938 Other specified abnormal uterine and vaginal bleeding: Secondary | ICD-10-CM | POA: Insufficient documentation

## 2016-01-30 DIAGNOSIS — R1084 Generalized abdominal pain: Secondary | ICD-10-CM

## 2016-01-30 DIAGNOSIS — K529 Noninfective gastroenteritis and colitis, unspecified: Secondary | ICD-10-CM | POA: Insufficient documentation

## 2016-01-30 DIAGNOSIS — K2970 Gastritis, unspecified, without bleeding: Secondary | ICD-10-CM | POA: Diagnosis not present

## 2016-01-30 DIAGNOSIS — R1111 Vomiting without nausea: Secondary | ICD-10-CM | POA: Diagnosis not present

## 2016-01-30 DIAGNOSIS — E119 Type 2 diabetes mellitus without complications: Secondary | ICD-10-CM | POA: Diagnosis not present

## 2016-01-30 LAB — COMPREHENSIVE METABOLIC PANEL
ALT: 17 U/L (ref 14–54)
ANION GAP: 8 (ref 5–15)
AST: 18 U/L (ref 15–41)
Albumin: 4.8 g/dL (ref 3.5–5.0)
Alkaline Phosphatase: 65 U/L (ref 38–126)
BUN: 12 mg/dL (ref 6–20)
CALCIUM: 9.1 mg/dL (ref 8.9–10.3)
CHLORIDE: 106 mmol/L (ref 101–111)
CO2: 26 mmol/L (ref 22–32)
CREATININE: 0.66 mg/dL (ref 0.44–1.00)
GFR calc non Af Amer: >60 mL/min (ref >60)
GLUCOSE: 93 mg/dL (ref 65–99)
POTASSIUM: 4.6 mmol/L (ref 3.5–5.1)
SODIUM: 140 mmol/L (ref 135–145)
TOTAL PROTEIN: 7.6 g/dL (ref 6.5–8.1)
Total Bilirubin: 0.5 mg/dL (ref 0.3–1.2)

## 2016-01-30 LAB — WET PREP, GENITAL
Clue Cells Wet Prep HPF POC: NONE SEEN
Sperm: NONE SEEN
Trich, Wet Prep: NONE SEEN
YEAST WET PREP: NONE SEEN

## 2016-01-30 LAB — I-STAT BETA HCG BLOOD, ED (MC, WL, AP ONLY)

## 2016-01-30 LAB — CBC
HCT: 39.9 % (ref 36.0–46.0)
Hemoglobin: 12.7 g/dL (ref 12.0–15.0)
MCH: 26.1 pg (ref 26.0–34.0)
MCHC: 31.8 g/dL (ref 30.0–36.0)
MCV: 82.1 fL (ref 78.0–100.0)
PLATELETS: 246 10*3/uL (ref 150–400)
RBC: 4.86 MIL/uL (ref 3.87–5.11)
RDW: 13.2 % (ref 11.5–15.5)
WBC: 12.4 10*3/uL — AB (ref 4.0–10.5)

## 2016-01-30 LAB — LIPASE, BLOOD: LIPASE: 24 U/L (ref 11–51)

## 2016-01-30 MED ORDER — IBUPROFEN 600 MG PO TABS: 600.0000 mg | ORAL_TABLET | Freq: Four times a day (QID) | ORAL | 0 refills | Status: DC | PRN

## 2016-01-30 MED ORDER — PROMETHAZINE HCL 25 MG/ML IJ SOLN
25.0000 mg | Freq: Once | INTRAMUSCULAR | Status: AC
  Administered 2016-01-30: 25 mg via INTRAVENOUS
  Filled 2016-01-30: qty 1

## 2016-01-30 MED ORDER — HYDROMORPHONE HCL 1 MG/ML IJ SOLN
1.0000 mg | Freq: Once | INTRAMUSCULAR | Status: AC
  Administered 2016-01-30: 1 mg via INTRAVENOUS
  Filled 2016-01-30: qty 1

## 2016-01-30 MED ORDER — IOPAMIDOL (ISOVUE-300) INJECTION 61%
100.0000 mL | Freq: Once | INTRAVENOUS | Status: AC | PRN
  Administered 2016-01-30: 100 mL via INTRAVENOUS

## 2016-01-30 NOTE — ED Triage Notes (Addendum)
Per EMS, Pt, from home, c/o L side abdominal pain, nausea, diarrhea and vaginal bleeding starting this afternoon.  Pain score 7/10.  Pt reports it's a "cramping" pain.  Pt reports that vaginal bleeding is only noted w/ wiping.

## 2016-01-30 NOTE — ED Triage Notes (Signed)
PT states that she has had 3 or 4 episodes of diarrhea today onset today with abdominal pain, + nausea, and no vomiting, Pt states that she has an IUD and denies possibility of pregnancy. Pt has vaginal bleeding that is dark in color and is noticed when she uses the bathroom. Pt states that she had similar abd pain and diarrhea with an episode of bacterial colitis. Pt does report that she feels a heaviness in her chest with deep breaths, chills and generalized weakness.

## 2016-01-30 NOTE — Discharge Instructions (Signed)
°  All the results in the ER are normal, labs and imaging. We are not sure what is causing your symptoms. The workup in the ER is not complete, and is limited to screening for life threatening and emergent conditions only, so please see a primary care doctor and gynecologist for further evaluation.

## 2016-01-30 NOTE — ED Notes (Signed)
Pt transported to CT ?

## 2016-01-30 NOTE — ED Provider Notes (Addendum)
[facility]-EMERGENCY DEPT Provider Note   CSN: [id number] Arrival date & time: 01/30/16  1623     History   Chief Complaint Chief Complaint  Patient presents with  . Abdominal Pain  . Emesis  . Diarrhea    HPI [name] is a 40 y.o. female.  HPI Pt comes in with cc of abd pain. Pt has hx of DM. She reports having L sided abd pain starting cramping and sharp pain. Pain is moderately severe and new. Pt has had 4 episodes of diarrhea, non bloody. Pt also reports vaginal bleeding episode today -which is new for her. Pt denies any vaginal discharge. Pt also reports some generalized chest discomfort - she feels like it's difficult to get air in. Bit pt has no cough. Pt has no hx of PE, DVT and denies any exogenous estrogen use, long distance travels or surgery in the past 6 weeks, active cancer, recent immobilization. Pt has no hx of cocaine use or smoking. No family hx of premature CAD.  Past Medical History:  Diagnosis Date  . ADD (attention deficit disorder)   . Allergy    Seasonal  . Annular disc tear   . Anxiety   . Bulging disc   . Chronic pain 04/20/13   Medication prescribed by Spivey, M.D.  . Degenerative disc disease   . Depression   . Diabetes mellitus   . Diabetic neuropathy (HCC)   . GERD (gastroesophageal reflux disease)   . Heart murmur    asa child  . Herniated disc   . Hyperlipemia   . 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 Retrolisthesis    L4 and L5  . Seizures (HCC)    hx of / month ago - dehydration   . Shortness of breath    with exertion   . Sleep apnea    obsructive settings at 13     Patient Active Problem List   Diagnosis Date Noted  . Abdominal pain 04/06/2014  . Adjustment disorder with mixed anxiety and depressed mood 04/27/2013  . Generalized anxiety disorder 04/26/2013  . Abdominal pain, left upper quadrant 03/29/2013  . Depression 12/28/2012  . History of prescription drug abuse  (HCC) 12/09/2012  . Acute encephalopathy 10/03/2012  . Other and unspecified postsurgical nonabsorption 05/19/2012  . Follow-up examination, following other surgery 05/19/2012  . Marginal ulcer 04/15/2012  . 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 LUQ abdominal pain 03/12/2012  . Diabetes mellitus (HCC) 01/19/2012  . OSA on CPAP 01/19/2012  . History of migraine 01/19/2012  . Morbid obesity (HCC) 08/20/2011  . Chest pain 07/28/2011  . Hemiplegia affecting left nondominant side (HCC) 07/28/2011  . 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 Conversion disorder 03/10/2011  . Abdominal pain 03/10/2011  . Diarrhea 03/10/2011    Past Surgical History:  Procedure Laterality Date  . BOWEL RESECTION  11/30/2011   Procedure: SMALL BOWEL RESECTION;  Surgeon: Benjamin T Hoxworth, MD;  Location: [facility];  Service: General;;  . BREATH TEK H PYLORI  09/11/2011   Procedure: BREATH TEK H PYLORI;  Surgeon: Benjamin T Hoxworth, MD;  Location: [facility];  Service: General;  Laterality: N/A;  . CESAREAN SECTION    . CHOLECYSTECTOMY    . ESOPHAGOGASTRODUODENOSCOPY  03/14/2012   Procedure: ESOPHAGOGASTRODUODENOSCOPY (EGD);  Surgeon: David H Newman, MD;  Location: [facility];  Service: General;  Laterality: N/A;  . ESOPHAGOGASTRODUODENOSCOPY  04/25/2012     Procedure: ESOPHAGOGASTRODUODENOSCOPY (EGD);  Surgeon: Shann Medal, MD;  Location: [facility];  Service: General;  Laterality: N/A;  . ESOPHAGOGASTRODUODENOSCOPY N/A 03/31/2013   Procedure: ESOPHAGOGASTRODUODENOSCOPY (EGD);  Surgeon: Shann Medal, MD;  Location: [facility];  Service: General;  Laterality: N/A;  . ESOPHAGOGASTRODUODENOSCOPY N/A 04/09/2014   Procedure: ESOPHAGOGASTRODUODENOSCOPY (EGD);  Surgeon: Alphonsa Overall, MD;  Location: [facility];  Service: General;  Laterality: N/A;  . GASTRIC ROUX-EN-Y  11/30/2011   Procedure: LAPAROSCOPIC ROUX-EN-Y GASTRIC;  Surgeon: Edward Jolly, MD;  Location: [facility];  Service: General;   Laterality: N/A;  . laporoscopy  2007  . TONSILLECTOMY      OB History    No data available       Home Medications    Prior to Admission medications   Medication Sig Start Date End Date Taking? Authorizing Provider  buPROPion (WELLBUTRIN XL) 300 MG 24 hr tablet Take 300 mg by mouth daily.   Yes Historical Provider, MD  CALCIUM-VITAMIN D PO Take 1 tablet by mouth 3 (three) times daily.    Yes Historical Provider, MD  Cyanocobalamin (VITAMIN B-12 PO) Take 1 tablet by mouth once a week.   Yes Historical Provider, MD  EPINEPHrine (EPI-PEN) 0.3 mg/0.3 mL SOAJ injection Inject 0.3 mg into the muscle daily as needed (allergic reaction).    Yes Historical Provider, MD  meloxicam (MOBIC) 15 MG tablet Take 15 mg by mouth daily. 01/16/16  Yes Historical Provider, MD  Multiple Vitamin (MULTIVITAMIN WITH MINERALS) TABS tablet Take 1 tablet by mouth 2 (two) times daily.    Yes Historical Provider, MD  naproxen sodium (ANAPROX) 220 MG tablet Take 220 mg by mouth 2 (two) times daily as needed (pain).   Yes Historical Provider, MD  OnabotulinumtoxinA (BOTOX IJ) Inject 1 each as directed every 3 (three) months. For migraines   Yes Historical Provider, MD  ibuprofen (ADVIL,MOTRIN) 600 MG tablet Take 1 tablet (600 mg total) by mouth every 6 (six) hours as needed. 01/30/16   Varney Biles, MD  LORazepam (ATIVAN) 0.5 MG tablet Take 2 tablets (1 mg total) by mouth 2 (two) times daily. Patient not taking: Reported on 01/30/2016 08/03/13   Norma Fredrickson, MD  nitrofurantoin, macrocrystal-monohydrate, (MACROBID) 100 MG capsule Take 100 mg by mouth 2 (two) times daily. For 10 days, completed around 01/26/16 01/17/16   Historical Provider, MD  pantoprazole (PROTONIX) 40 MG tablet Take 1 tablet (40 mg total) by mouth daily. Patient not taking: Reported on 06/29/2014 04/10/14   Excell Seltzer, MD  traZODone (DESYREL) 100 MG tablet Take 2 tablets (200 mg total) by mouth at bedtime. Patient not taking: Reported on  04/07/2014 08/07/13   Norma Fredrickson, MD    Family History Family History  Problem Relation Age of Onset  . Diabetes Mother   . Hypertension Mother   . Depression Mother   . OCD Mother   . Depression Brother   . Cancer Maternal Aunt     ovarian   . Depression Maternal Aunt   . Diabetes Maternal Aunt   . Cancer Maternal Grandmother     Lung  . COPD Maternal Grandmother   . Cancer Maternal Grandfather     Prostate  . Diabetes Maternal Grandfather   . Hearing loss Maternal Grandfather   . Hypertension Maternal Grandfather   . Stroke Maternal Grandfather   . Cancer Paternal Grandmother     Colon  . Diabetes type II      Social History Social History  Substance Use Topics  .  Smoking status: Never Smoker  . Smokeless tobacco: Never Used  . Alcohol use No     Allergies   Ace inhibitors; Atarax [hydroxyzine hcl]; Cephalosporins; Codeine; Compazine; Decadron [dexamethasone]; Demerol; Hydroxyzine hcl; Metoclopramide hcl; Morphine and related; Ondansetron; Other; Penicillins; Pork-derived products; Prozac [fluoxetine hcl]; Shellfish-derived products; Stevia glycerite extract [flavoring agent]; Sulfa antibiotics; Triptans; Zanaflex [tizanidine hcl]; Zofran; Dihydroergotamine; and Periactin [cyproheptadine]   Review of Systems Review of Systems  ROS 10 Systems reviewed and are negative for acute change except as noted in the HPI.     Physical Exam Updated Vital Signs BP 110/78 (BP Location: Right Arm)   Pulse 81   Temp 98.7 F (37.1 C) (Oral)   Resp 16   Ht 5' 7" (1.702 m)   Wt 184 lb (83.5 kg)   SpO2 99%   BMI 28.82 kg/m   Physical Exam  Constitutional: She is oriented to person, place, and time. She appears well-developed and well-nourished.  HENT:  Head: Normocephalic and atraumatic.  Eyes: Conjunctivae and EOM are normal. Pupils are equal, round, and reactive to light.  Neck: Normal range of motion. Neck supple.  Cardiovascular: Normal rate, regular rhythm,  normal heart sounds and intact distal pulses.   No murmur heard. Pulmonary/Chest: Effort normal. No respiratory distress. She has no wheezes.  Abdominal: Soft. Bowel sounds are normal. She exhibits no distension. There is tenderness. There is no rebound and no guarding.  Genitourinary: Vagina normal and uterus normal.  Genitourinary Comments: External exam - normal, no lesions Speculum exam: Pt has no discharge, + dark blood Bimanual exam: Patient has no CMT, no adnexal tenderness or fullness and cervical os is closed  Neurological: She is alert and oriented to person, place, and time.  Skin: Skin is warm and dry.  Nursing note and vitals reviewed.    ED Treatments / Results  Labs (all labs ordered are listed, but only abnormal results are displayed) Labs Reviewed  WET PREP, GENITAL - Abnormal; Notable for the following:       Result Value   WBC, Wet Prep HPF POC MODERATE (*)    All other components within normal limits  CBC - Abnormal; Notable for the following:    WBC 12.4 (*)    All other components within normal limits  LIPASE, BLOOD  COMPREHENSIVE METABOLIC PANEL  URINALYSIS, ROUTINE W REFLEX MICROSCOPIC (NOT AT [facility])  I-STAT BETA HCG BLOOD, ED ([facility], [facility], [facility] ONLY)  GC/CHLAMYDIA PROBE AMP ([facility]) NOT AT [facility]    EKG  EKG Interpretation None       Radiology Ct Abdomen Pelvis W Contrast  Result Date: 01/30/2016 CLINICAL DATA:  Three of 4 episodes of diarrhea with abdominal pain and nausea EXAM: CT ABDOMEN AND PELVIS WITH CONTRAST TECHNIQUE: Multidetector CT imaging of the abdomen and pelvis was performed using the standard protocol following bolus administration of intravenous contrast. CONTRAST:  100mL ISOVUE-300 IOPAMIDOL (ISOVUE-300) INJECTION 61% COMPARISON:  04/06/2014 FINDINGS: Lower chest: Lung bases are clear. Hepatobiliary: Surgical clips in the gallbladder fossa consistent with prior cholecystectomy. Minimal central intra hepatic biliary dilatation. Mildly  prominent extrahepatic common bile duct measuring up to 9 mm in diameter. This is likely secondary to post cholecystectomy changes. Pancreas: Unremarkable. No pancreatic ductal dilatation or surrounding inflammatory changes. Spleen: Normal in size without focal abnormality. Adrenals/Urinary Tract: Adrenal glands are unremarkable. Kidneys are normal, without renal calculi, focal lesion, or hydronephrosis. Bladder is unremarkable. Stomach/Bowel: The patient is status post gastric bypass surgery. There is no evidence for a   bowel obstruction. Moderate stool is present within the colon. Vascular/Lymphatic: Non aneurysmal aorta. There are multiple mildly enlarged right lower quadrant lymph nodes measuring up to 13 mm in short axis. Appendix is within normal limits. Reproductive: Intrauterine device is present within the uterus. No adnexal masses. Other: No abdominal wall hernia or abnormality. No abdominopelvic ascites. Musculoskeletal: No acute or significant osseous findings. IMPRESSION: 1. Patient is status post gastric bypass with postsurgical changes in the upper abdomen. No evidence for bowel obstruction. 2. Mild intra and extra hepatic biliary dilatation, likely postsurgical. 3. Slightly enlarged right lower quadrant mesenteric lymph nodes, can be seen with mesenteric adenitis in the appropriate clinical setting. Electronically Signed   By: Donavan Foil M.D.   On: 01/30/2016 22:47    Procedures Procedures (including critical care time)  Medications Ordered in ED Medications  HYDROmorphone (DILAUDID) injection 1 mg (1 mg Intravenous Given 01/30/16 2154)  promethazine (PHENERGAN) injection 25 mg (25 mg Intravenous Given 01/30/16 2154)  iopamidol (ISOVUE-300) 61 % injection 100 mL (100 mLs Intravenous Contrast Given 01/30/16 2217)  HYDROmorphone (DILAUDID) injection 1 mg (1 mg Intravenous Given 01/30/16 2316)     Initial Impression / Assessment and Plan / ED Course  I have reviewed the triage vital signs  and the nursing notes.  Pertinent labs & imaging results that were available during my care of the patient were reviewed by me and considered in my medical decision making (see chart for details).  Clinical Course     I personally performed the services described in this documentation, which was scribed in my presence. The recorded information has been reviewed and is accurate.  Pt comes in with cc of abd pain.  LLQ abd tenderness - CT ordered, and is neg. Pelvic exam done - benign overall - advised gyne f/u for optimal care.  Chest pain - not cardiac type in etiology. PERC neg. No signs of DVT. Lungs are clear. No further workup.    Final Clinical Impressions(s) / ED Diagnoses   Final diagnoses:  DUB (dysfunctional uterine bleeding)  Generalized abdominal pain  Gastroenteritis    New Prescriptions New Prescriptions   IBUPROFEN (ADVIL,MOTRIN) 600 MG TABLET    Take 1 tablet (600 mg total) by mouth every 6 (six) hours as needed.     Varney Biles, MD 01/30/16 9030    Varney Biles, MD 01/30/16 0923

## 2016-01-30 NOTE — ED Notes (Signed)
Pt made aware of need for urine specimen 

## 2016-01-31 DIAGNOSIS — Z9884 Bariatric surgery status: Secondary | ICD-10-CM | POA: Diagnosis not present

## 2016-01-31 LAB — GC/CHLAMYDIA PROBE AMP (~~LOC~~) NOT AT ARMC
Chlamydia: NEGATIVE
Neisseria Gonorrhea: NEGATIVE

## 2016-02-04 DIAGNOSIS — N939 Abnormal uterine and vaginal bleeding, unspecified: Secondary | ICD-10-CM | POA: Diagnosis not present

## 2016-02-18 DIAGNOSIS — R102 Pelvic and perineal pain: Secondary | ICD-10-CM | POA: Diagnosis not present

## 2016-02-24 DIAGNOSIS — Z9884 Bariatric surgery status: Secondary | ICD-10-CM | POA: Diagnosis not present

## 2016-03-05 ENCOUNTER — Encounter: Payer: Medicare Other | Attending: General Surgery | Admitting: Dietician

## 2016-03-05 ENCOUNTER — Encounter: Payer: Self-pay | Admitting: Dietician

## 2016-03-05 DIAGNOSIS — Z9884 Bariatric surgery status: Secondary | ICD-10-CM | POA: Diagnosis not present

## 2016-03-05 DIAGNOSIS — Z713 Dietary counseling and surveillance: Secondary | ICD-10-CM | POA: Diagnosis not present

## 2016-03-05 NOTE — Patient Instructions (Addendum)
-  Increase physical activity  -Make it fun!  -Buddy up with friend(s)  -Sign up for a 5k, brainstorm some activities that get you out of your comfort zone  -Include a protein food with each meal and snack  -Try Gardein meatless products  -Try adding vanilla protein shake to coffee  -Eat protein first, then vegetables  -Implement meal schedule with assigned family members  -Online grocery ordering    -Let the kids contribute to the grocery list based on the recipe  -Celebrate or Bariatric Advantage multivitamin  -Try the soft chews and add an iron supplement

## 2016-03-05 NOTE — Progress Notes (Signed)
  Medical Nutrition Therapy:  Appt start time: 0915 end time:  1035   Assessment:  Primary concerns today:  [name] is here today to discuss her weight. She has 3 kids, ages 34, 89, and 7 years. 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 [name] had RYGB in September 2013 and saw bariatric RD regularly after surgery. Weight fluctuated throughout her adult life. Highest weight 300 lbs and developed insulin-dependent diabetes prior to surgery. She states she followed the diet recommendations after surgery and lost 100+ lbs; she has maintained a 120-lbs weight loss. Diabetes and sleep apnea in remission. No longer as strict with her diet but has continued many healthy habits: eating off of smaller plates, watching portion sizes, vegetarian diet. Not exercising as much as she would like. Lowest weight 176 lbs. Very rarely has vomiting but sometimes has dumping syndrome. Continues to take vitamins. Recently had vitamin labs taken and everything was normal except ferritin. 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     TANITA  BODY COMP RESULTS  03/05/16   Fat % 41.2   Fat Mass (lbs) 74.6   Fat Free Mass (lbs) 106.4   Total Body Water (lbs) 76.2       Preferred Learning Style:   No preference indicated   Learning Readiness:   Ready   MEDICATIONS: wellbutrin   DIETARY INTAKE:  Usual eating pattern includes 2 meals and 2 snacks per day.  24-hr recall:  B ( AM): usually skips (sometimes has a bagel), coffee with flavored cream and Splenda  Snk ( AM):   L (12 PM): vegetables and rice from [employer], mozzarella sticks, grilled cheese Snk ( PM): homemade granola or fruit and Brie D (5:30-6 PM): spaghetti and meatballs, salad, pizza, eggplant parmesan Snk ( PM): sometimes granola  Beverages:  coffee, G2, water, unsweet tea with Splenda, diet coke  Usual physical activity: none  Estimated energy needs: 1600-1800 calories 180-200 g carbohydrates 120-135 g protein 44-50 g fat  Progress Towards Goal(s):  In progress.   Nutritional Diagnosis:  NI-5.7.1 Inadequate protein intake As related to lack of quality protein due to vegetarian diet and history of bariatric surgery.  As evidenced by diet recall.    Intervention:  Nutrition counseling provided. Goals: -Increase physical activity  -Make it fun!  -Buddy up with friend(s)  -Sign up for a 5k, brainstorm some activities that get you out of your comfort zone -Include a protein food with each meal and snack  -Try Gardein meatless products  -Try adding vanilla protein shake to coffee  -Eat protein first, then vegetables -Implement meal schedule with assigned family members  -Online grocery ordering    -Let the kids contribute to the grocery list based on the recipe -Celebrate or Bariatric Advantage multivitamin  -Try the soft chews and add an iron supplement  Teaching Method Utilized:  Visual Auditory Hands on  Handouts given during visit include:  Vegetarian proteins  Barriers to learning/adherence to lifestyle change: none  Demonstrated degree of understanding via:  Teach Back   Monitoring/Evaluation:  Dietary intake, exercise, and body weight in 2 week(s).

## 2016-03-19 ENCOUNTER — Encounter: Payer: Medicare Other | Admitting: Dietician

## 2016-03-19 ENCOUNTER — Encounter: Payer: Self-pay | Admitting: Dietician

## 2016-03-19 DIAGNOSIS — Z713 Dietary counseling and surveillance: Secondary | ICD-10-CM | POA: Diagnosis not present

## 2016-03-19 DIAGNOSIS — Z9884 Bariatric surgery status: Secondary | ICD-10-CM | POA: Diagnosis not present

## 2016-03-19 NOTE — Patient Instructions (Addendum)
-  Increase physical activity  -Make it fun!  -Buddy up with friend(s)  -Sign up for a 5k, brainstorm some activities that get you out of your comfort zone  -Include a protein food with each meal and snack  -Try Gardein meatless products  -Add vanilla protein shake to coffee (breakfast)  -Eat protein first, then vegetables  -Implement meal schedule with assigned family members  -Online grocery ordering    -Let the kids contribute to the grocery list based on the recipe  -Celebrate or Bariatric Advantage multivitamin  -Try the soft chews and add an iron supplement    TANITA  BODY COMP RESULTS  03/05/16 03/19/16   Fat % 41.2 41.3   Fat Mass (lbs) 74.6 74   Fat Free Mass (lbs) 106.4 105.4   Total Body Water (lbs) 76.2 75.2

## 2016-03-19 NOTE — Progress Notes (Signed)
  Medical Nutrition Therapy:  Appt start time: [id number] end time:  1140   Assessment:  Primary concerns today:  [name] returns having  lost 2 pounds in 2 weeks despite Hanukah. She recently started iron supplement and struggling with constipation. Has been very busy hosting holiday parties. Notices she is drinking more while she is eating but doesn't feel like this affects her significantly. Drinking less Diet Coke. Has not had a chance to increase physical activity. Tried adding protein shake to coffee and noticed that it made her feel full.   One Touch Verio Flex Glucometer provided (qty 1) Lot#: [id number] X Exp: 12/08/28  TANITA  BODY COMP RESULTS  03/05/16 03/19/16   Fat % 41.2 41.3   Fat Mass (lbs) 74.6 74   Fat Free Mass (lbs) 106.4 105.4   Total Body Water (lbs) 76.2 75.2       Preferred Learning Style:   No preference indicated   Learning Readiness:   Ready   MEDICATIONS: wellbutrin   DIETARY INTAKE:  Usual eating pattern includes 2 meals and 2 snacks per day.  24-hr recall:  B ( AM): usually skips (sometimes has a bagel), coffee with flavored cream and Splenda  Snk ( AM):   L (12 PM): vegetables and rice from [employer], mozzarella sticks, grilled cheese Snk ( PM): homemade granola or fruit and Brie D (5:30-6 PM): spaghetti and meatballs, salad, pizza, eggplant parmesan Snk ( PM): sometimes granola  Beverages: coffee, G2, water, unsweet tea with Splenda, diet coke  Usual physical activity: none  Estimated energy needs: 1600-1800 calories 180-200 g carbohydrates 120-135 g protein 44-50 g fat  Progress Towards Goal(s):  In progress.   Nutritional Diagnosis:  NI-5.7.1 Inadequate protein intake As related to lack of quality protein due to vegetarian diet and history of bariatric surgery.  As evidenced by diet recall.    Intervention:  Nutrition counseling provided. Goals: -Increase physical activity  -Make it fun!  -Buddy up with friend(s)  -Sign up  for a 5k, brainstorm some activities that get you out of your comfort zone -Include a protein food with each meal and snack  -Try Gardein meatless products  -Add vanilla protein shake to coffee (breakfast)  -Eat protein first, then vegetables -Implement meal schedule with assigned family members  -Online grocery ordering    -Let the kids contribute to the grocery list based on the recipe -Celebrate or Bariatric Advantage multivitamin  -Try the soft chews and add an iron supplement  Teaching Method Utilized:  Visual Auditory Hands on  Handouts given during visit include:  Vegetarian proteins  Barriers to learning/adherence to lifestyle change: none  Demonstrated degree of understanding via:  Teach Back   Monitoring/Evaluation:  Dietary intake, exercise, and body weight in 2 week(s).

## 2016-03-27 DIAGNOSIS — J111 Influenza due to unidentified influenza virus with other respiratory manifestations: Secondary | ICD-10-CM | POA: Diagnosis not present

## 2016-03-27 DIAGNOSIS — J00 Acute nasopharyngitis [common cold]: Secondary | ICD-10-CM | POA: Diagnosis not present

## 2016-04-02 ENCOUNTER — Ambulatory Visit: Payer: Self-pay | Admitting: Dietician

## 2016-04-07 DIAGNOSIS — R1084 Generalized abdominal pain: Secondary | ICD-10-CM | POA: Diagnosis not present

## 2016-04-14 DIAGNOSIS — N7689 Other specified inflammation of vagina and vulva: Secondary | ICD-10-CM | POA: Diagnosis not present

## 2016-04-14 DIAGNOSIS — Z112 Encounter for screening for other bacterial diseases: Secondary | ICD-10-CM | POA: Diagnosis not present

## 2016-04-15 DIAGNOSIS — G43709 Chronic migraine without aura, not intractable, without status migrainosus: Secondary | ICD-10-CM | POA: Diagnosis not present

## 2016-04-16 DIAGNOSIS — B029 Zoster without complications: Secondary | ICD-10-CM | POA: Diagnosis not present

## 2016-04-16 DIAGNOSIS — B009 Herpesviral infection, unspecified: Secondary | ICD-10-CM | POA: Diagnosis not present

## 2016-04-20 DIAGNOSIS — B029 Zoster without complications: Secondary | ICD-10-CM | POA: Diagnosis not present

## 2016-04-24 ENCOUNTER — Other Ambulatory Visit: Payer: Self-pay | Admitting: Family Medicine

## 2016-04-24 DIAGNOSIS — Z1231 Encounter for screening mammogram for malignant neoplasm of breast: Secondary | ICD-10-CM

## 2016-04-25 DIAGNOSIS — M791 Myalgia: Secondary | ICD-10-CM | POA: Diagnosis not present

## 2016-04-25 DIAGNOSIS — J0100 Acute maxillary sinusitis, unspecified: Secondary | ICD-10-CM | POA: Diagnosis not present

## 2016-04-25 DIAGNOSIS — R110 Nausea: Secondary | ICD-10-CM | POA: Diagnosis not present

## 2016-04-28 DIAGNOSIS — B029 Zoster without complications: Secondary | ICD-10-CM | POA: Diagnosis not present

## 2016-04-28 DIAGNOSIS — B0229 Other postherpetic nervous system involvement: Secondary | ICD-10-CM | POA: Diagnosis not present

## 2016-04-29 ENCOUNTER — Ambulatory Visit
Admission: RE | Admit: 2016-04-29 | Discharge: 2016-04-29 | Disposition: A | Discharge status: Home / Self Care | Payer: 59 | Source: Ambulatory Visit | Attending: Family Medicine | Admitting: Family Medicine

## 2016-04-29 DIAGNOSIS — Z1231 Encounter for screening mammogram for malignant neoplasm of breast: Secondary | ICD-10-CM

## 2016-05-05 ENCOUNTER — Other Ambulatory Visit: Payer: Self-pay | Admitting: Family Medicine

## 2016-05-05 ENCOUNTER — Ambulatory Visit
Admission: RE | Admit: 2016-05-05 | Discharge: 2016-05-05 | Disposition: A | Discharge status: Home / Self Care | Payer: Managed Care, Other (non HMO) | Source: Ambulatory Visit | Attending: Family Medicine | Admitting: Family Medicine

## 2016-05-05 DIAGNOSIS — M79644 Pain in right finger(s): Secondary | ICD-10-CM

## 2016-05-05 DIAGNOSIS — S6991XA Unspecified injury of right wrist, hand and finger(s), initial encounter: Secondary | ICD-10-CM | POA: Diagnosis not present

## 2016-05-08 ENCOUNTER — Other Ambulatory Visit: Payer: Self-pay | Admitting: Family Medicine

## 2016-05-08 ENCOUNTER — Ambulatory Visit
Admission: RE | Admit: 2016-05-08 | Discharge: 2016-05-08 | Disposition: A | Discharge status: Home / Self Care | Payer: Managed Care, Other (non HMO) | Source: Ambulatory Visit | Attending: Family Medicine | Admitting: Family Medicine

## 2016-05-08 DIAGNOSIS — M542 Cervicalgia: Secondary | ICD-10-CM | POA: Diagnosis not present

## 2016-05-08 DIAGNOSIS — M25511 Pain in right shoulder: Secondary | ICD-10-CM | POA: Diagnosis not present

## 2016-05-08 DIAGNOSIS — J029 Acute pharyngitis, unspecified: Secondary | ICD-10-CM | POA: Diagnosis not present

## 2016-06-03 DIAGNOSIS — G43909 Migraine, unspecified, not intractable, without status migrainosus: Secondary | ICD-10-CM | POA: Diagnosis not present

## 2016-06-03 DIAGNOSIS — R7303 Prediabetes: Secondary | ICD-10-CM | POA: Diagnosis not present

## 2016-06-03 DIAGNOSIS — M542 Cervicalgia: Secondary | ICD-10-CM | POA: Diagnosis not present

## 2016-06-03 DIAGNOSIS — B028 Zoster with other complications: Secondary | ICD-10-CM | POA: Diagnosis not present

## 2016-06-03 DIAGNOSIS — H6121 Impacted cerumen, right ear: Secondary | ICD-10-CM | POA: Diagnosis not present

## 2016-06-29 DIAGNOSIS — Z01419 Encounter for gynecological examination (general) (routine) without abnormal findings: Secondary | ICD-10-CM | POA: Diagnosis not present

## 2016-06-29 DIAGNOSIS — Z6829 Body mass index (BMI) 29.0-29.9, adult: Secondary | ICD-10-CM | POA: Diagnosis not present

## 2016-07-01 DIAGNOSIS — D2271 Melanocytic nevi of right lower limb, including hip: Secondary | ICD-10-CM | POA: Diagnosis not present

## 2016-07-01 DIAGNOSIS — Z808 Family history of malignant neoplasm of other organs or systems: Secondary | ICD-10-CM | POA: Diagnosis not present

## 2016-07-01 DIAGNOSIS — D225 Melanocytic nevi of trunk: Secondary | ICD-10-CM | POA: Diagnosis not present

## 2016-07-01 DIAGNOSIS — Z86018 Personal history of other benign neoplasm: Secondary | ICD-10-CM | POA: Diagnosis not present

## 2016-07-01 DIAGNOSIS — D2230 Melanocytic nevi of unspecified part of face: Secondary | ICD-10-CM | POA: Diagnosis not present

## 2016-07-01 DIAGNOSIS — D2261 Melanocytic nevi of right upper limb, including shoulder: Secondary | ICD-10-CM | POA: Diagnosis not present

## 2016-07-16 DIAGNOSIS — G43709 Chronic migraine without aura, not intractable, without status migrainosus: Secondary | ICD-10-CM | POA: Diagnosis not present

## 2016-07-20 DIAGNOSIS — R202 Paresthesia of skin: Secondary | ICD-10-CM | POA: Diagnosis not present

## 2016-07-20 DIAGNOSIS — R739 Hyperglycemia, unspecified: Secondary | ICD-10-CM | POA: Diagnosis not present

## 2016-07-20 DIAGNOSIS — R110 Nausea: Secondary | ICD-10-CM | POA: Diagnosis not present

## 2016-07-20 DIAGNOSIS — G43909 Migraine, unspecified, not intractable, without status migrainosus: Secondary | ICD-10-CM | POA: Diagnosis not present

## 2016-07-20 DIAGNOSIS — R300 Dysuria: Secondary | ICD-10-CM | POA: Diagnosis not present

## 2016-07-20 DIAGNOSIS — F4329 Adjustment disorder with other symptoms: Secondary | ICD-10-CM | POA: Diagnosis not present

## 2016-08-11 DIAGNOSIS — M25561 Pain in right knee: Secondary | ICD-10-CM | POA: Diagnosis not present

## 2016-08-28 DIAGNOSIS — M25572 Pain in left ankle and joints of left foot: Secondary | ICD-10-CM | POA: Diagnosis not present

## 2016-09-05 DIAGNOSIS — M25561 Pain in right knee: Secondary | ICD-10-CM | POA: Diagnosis not present

## 2016-09-08 DIAGNOSIS — M25561 Pain in right knee: Secondary | ICD-10-CM | POA: Diagnosis not present

## 2016-09-11 DIAGNOSIS — R531 Weakness: Secondary | ICD-10-CM | POA: Diagnosis not present

## 2016-09-11 DIAGNOSIS — M25561 Pain in right knee: Secondary | ICD-10-CM | POA: Diagnosis not present

## 2016-09-11 DIAGNOSIS — M25661 Stiffness of right knee, not elsewhere classified: Secondary | ICD-10-CM | POA: Diagnosis not present

## 2016-09-14 DIAGNOSIS — M25561 Pain in right knee: Secondary | ICD-10-CM | POA: Diagnosis not present

## 2016-09-14 DIAGNOSIS — M25661 Stiffness of right knee, not elsewhere classified: Secondary | ICD-10-CM | POA: Diagnosis not present

## 2016-09-14 DIAGNOSIS — R531 Weakness: Secondary | ICD-10-CM | POA: Diagnosis not present

## 2016-09-16 DIAGNOSIS — M25661 Stiffness of right knee, not elsewhere classified: Secondary | ICD-10-CM | POA: Diagnosis not present

## 2016-09-16 DIAGNOSIS — R531 Weakness: Secondary | ICD-10-CM | POA: Diagnosis not present

## 2016-09-16 DIAGNOSIS — M25561 Pain in right knee: Secondary | ICD-10-CM | POA: Diagnosis not present

## 2016-09-18 DIAGNOSIS — M25661 Stiffness of right knee, not elsewhere classified: Secondary | ICD-10-CM | POA: Diagnosis not present

## 2016-09-18 DIAGNOSIS — M25561 Pain in right knee: Secondary | ICD-10-CM | POA: Diagnosis not present

## 2016-09-18 DIAGNOSIS — R531 Weakness: Secondary | ICD-10-CM | POA: Diagnosis not present

## 2016-09-22 DIAGNOSIS — M25561 Pain in right knee: Secondary | ICD-10-CM | POA: Diagnosis not present

## 2016-09-22 DIAGNOSIS — M25661 Stiffness of right knee, not elsewhere classified: Secondary | ICD-10-CM | POA: Diagnosis not present

## 2016-09-22 DIAGNOSIS — R531 Weakness: Secondary | ICD-10-CM | POA: Diagnosis not present

## 2016-10-05 DIAGNOSIS — M25561 Pain in right knee: Secondary | ICD-10-CM | POA: Diagnosis not present

## 2016-10-05 DIAGNOSIS — M25661 Stiffness of right knee, not elsewhere classified: Secondary | ICD-10-CM | POA: Diagnosis not present

## 2016-10-05 DIAGNOSIS — R531 Weakness: Secondary | ICD-10-CM | POA: Diagnosis not present

## 2016-10-07 DIAGNOSIS — R531 Weakness: Secondary | ICD-10-CM | POA: Diagnosis not present

## 2016-10-07 DIAGNOSIS — M25661 Stiffness of right knee, not elsewhere classified: Secondary | ICD-10-CM | POA: Diagnosis not present

## 2016-10-07 DIAGNOSIS — M25561 Pain in right knee: Secondary | ICD-10-CM | POA: Diagnosis not present

## 2016-10-13 DIAGNOSIS — M25561 Pain in right knee: Secondary | ICD-10-CM | POA: Diagnosis not present

## 2016-10-15 DIAGNOSIS — M25661 Stiffness of right knee, not elsewhere classified: Secondary | ICD-10-CM | POA: Diagnosis not present

## 2016-10-15 DIAGNOSIS — R531 Weakness: Secondary | ICD-10-CM | POA: Diagnosis not present

## 2016-10-15 DIAGNOSIS — M25561 Pain in right knee: Secondary | ICD-10-CM | POA: Diagnosis not present

## 2016-10-16 DIAGNOSIS — G43709 Chronic migraine without aura, not intractable, without status migrainosus: Secondary | ICD-10-CM | POA: Diagnosis not present

## 2016-11-07 DIAGNOSIS — S90111A Contusion of right great toe without damage to nail, initial encounter: Secondary | ICD-10-CM | POA: Diagnosis not present

## 2016-11-28 DIAGNOSIS — M25511 Pain in right shoulder: Secondary | ICD-10-CM | POA: Diagnosis not present

## 2017-01-13 DIAGNOSIS — J0140 Acute pansinusitis, unspecified: Secondary | ICD-10-CM | POA: Diagnosis not present

## 2017-02-01 DIAGNOSIS — Z30433 Encounter for removal and reinsertion of intrauterine contraceptive device: Secondary | ICD-10-CM | POA: Diagnosis not present

## 2017-03-26 DIAGNOSIS — R1032 Left lower quadrant pain: Secondary | ICD-10-CM | POA: Diagnosis not present

## 2017-03-26 DIAGNOSIS — A084 Viral intestinal infection, unspecified: Secondary | ICD-10-CM | POA: Diagnosis not present

## 2017-04-02 DIAGNOSIS — R109 Unspecified abdominal pain: Secondary | ICD-10-CM | POA: Diagnosis not present

## 2017-04-02 DIAGNOSIS — R197 Diarrhea, unspecified: Secondary | ICD-10-CM | POA: Diagnosis not present

## 2017-04-02 DIAGNOSIS — R7303 Prediabetes: Secondary | ICD-10-CM | POA: Diagnosis not present

## 2017-04-12 DIAGNOSIS — R109 Unspecified abdominal pain: Secondary | ICD-10-CM | POA: Diagnosis not present

## 2017-04-12 DIAGNOSIS — R7303 Prediabetes: Secondary | ICD-10-CM | POA: Diagnosis not present

## 2017-04-12 DIAGNOSIS — R197 Diarrhea, unspecified: Secondary | ICD-10-CM | POA: Diagnosis not present

## 2017-04-13 DIAGNOSIS — R197 Diarrhea, unspecified: Secondary | ICD-10-CM | POA: Diagnosis not present

## 2017-04-21 DIAGNOSIS — G43709 Chronic migraine without aura, not intractable, without status migrainosus: Secondary | ICD-10-CM | POA: Diagnosis not present

## 2017-07-02 ENCOUNTER — Encounter (HOSPITAL_COMMUNITY): Payer: Self-pay

## 2017-07-05 DIAGNOSIS — Z6828 Body mass index (BMI) 28.0-28.9, adult: Secondary | ICD-10-CM | POA: Diagnosis not present

## 2017-07-05 DIAGNOSIS — Z01419 Encounter for gynecological examination (general) (routine) without abnormal findings: Secondary | ICD-10-CM | POA: Diagnosis not present

## 2017-07-07 DIAGNOSIS — T782XXA Anaphylactic shock, unspecified, initial encounter: Secondary | ICD-10-CM | POA: Diagnosis not present

## 2017-07-07 DIAGNOSIS — R109 Unspecified abdominal pain: Secondary | ICD-10-CM | POA: Diagnosis not present

## 2017-07-13 DIAGNOSIS — R739 Hyperglycemia, unspecified: Secondary | ICD-10-CM | POA: Diagnosis not present

## 2017-07-13 DIAGNOSIS — E7849 Other hyperlipidemia: Secondary | ICD-10-CM | POA: Diagnosis not present

## 2017-07-13 DIAGNOSIS — T7840XD Allergy, unspecified, subsequent encounter: Secondary | ICD-10-CM | POA: Diagnosis not present

## 2017-07-15 DIAGNOSIS — D2262 Melanocytic nevi of left upper limb, including shoulder: Secondary | ICD-10-CM | POA: Diagnosis not present

## 2017-07-15 DIAGNOSIS — G43709 Chronic migraine without aura, not intractable, without status migrainosus: Secondary | ICD-10-CM | POA: Diagnosis not present

## 2017-07-20 DIAGNOSIS — S93602A Unspecified sprain of left foot, initial encounter: Secondary | ICD-10-CM | POA: Diagnosis not present

## 2017-08-11 DIAGNOSIS — Z809 Family history of malignant neoplasm, unspecified: Secondary | ICD-10-CM | POA: Diagnosis not present

## 2017-08-11 DIAGNOSIS — Z1231 Encounter for screening mammogram for malignant neoplasm of breast: Secondary | ICD-10-CM | POA: Diagnosis not present

## 2017-08-11 DIAGNOSIS — T781XXD Other adverse food reactions, not elsewhere classified, subsequent encounter: Secondary | ICD-10-CM | POA: Diagnosis not present

## 2017-08-11 DIAGNOSIS — J300 Vasomotor rhinitis: Secondary | ICD-10-CM | POA: Diagnosis not present

## 2017-08-11 DIAGNOSIS — J301 Allergic rhinitis due to pollen: Secondary | ICD-10-CM | POA: Diagnosis not present

## 2017-08-19 ENCOUNTER — Encounter: Payer: Self-pay | Admitting: Hematology

## 2017-08-19 ENCOUNTER — Telehealth: Payer: Self-pay | Admitting: Hematology

## 2017-08-19 NOTE — Telephone Encounter (Signed)
New high risk breast referral has been received from Dr. Radene Knee at [facility]. Pt has been scheduled to see Dr. Burr Medico on 6/18 at 230pm. Pt aware to arrive 30 minutes early. Letter mailed to the pt and faxed to the referring provider with the appt information.

## 2017-08-20 ENCOUNTER — Other Ambulatory Visit: Payer: Self-pay | Admitting: Obstetrics and Gynecology

## 2017-08-20 DIAGNOSIS — Z1502 Genetic susceptibility to malignant neoplasm of ovary: Principal | ICD-10-CM

## 2017-08-20 DIAGNOSIS — Z1501 Genetic susceptibility to malignant neoplasm of breast: Secondary | ICD-10-CM

## 2017-08-20 DIAGNOSIS — Z1509 Genetic susceptibility to other malignant neoplasm: Principal | ICD-10-CM

## 2017-08-31 DIAGNOSIS — Z1502 Genetic susceptibility to malignant neoplasm of ovary: Secondary | ICD-10-CM | POA: Diagnosis not present

## 2017-08-31 DIAGNOSIS — Z8041 Family history of malignant neoplasm of ovary: Secondary | ICD-10-CM | POA: Diagnosis not present

## 2017-09-07 ENCOUNTER — Inpatient Hospital Stay: Payer: Managed Care, Other (non HMO) | Attending: Hematology | Admitting: Hematology

## 2017-09-07 ENCOUNTER — Encounter: Payer: Self-pay | Admitting: Hematology

## 2017-09-07 ENCOUNTER — Telehealth: Payer: Self-pay | Admitting: Hematology

## 2017-09-07 DIAGNOSIS — Z1502 Genetic susceptibility to malignant neoplasm of ovary: Secondary | ICD-10-CM

## 2017-09-07 DIAGNOSIS — Z1509 Genetic susceptibility to other malignant neoplasm: Secondary | ICD-10-CM

## 2017-09-07 DIAGNOSIS — Z1501 Genetic susceptibility to malignant neoplasm of breast: Secondary | ICD-10-CM | POA: Diagnosis not present

## 2017-09-07 DIAGNOSIS — E119 Type 2 diabetes mellitus without complications: Secondary | ICD-10-CM | POA: Insufficient documentation

## 2017-09-07 NOTE — Telephone Encounter (Signed)
Appointments scheduled pat declined [facility]. Calendar printed.  Referral entered into proficient per 6/18 los

## 2017-09-07 NOTE — Progress Notes (Signed)
[place]  [phone]) [phone] Fax:([phone]) [place] Note   Patient Care Team: Vernie Shanks, MD as PCP - General (Family Medicine) Himmelrich, Bryson Ha, RD (Inactive) as Dietitian Chucky May, MD as Consulting Physician (Psychiatry) Amil Amen, MD as Referring Physician (Psychiatry) Suella Broad, MD as Consulting Physician (Physical Medicine and Rehabilitation)   Date of Service:  09/07/2017  REFERRAL PHYSICIAN: Dr. Radene Knee   CHIEF COMPLAINTS/PURPOSE OF CONSULTATION:  BRCA1 mutation positive   HISTORY OF PRESENTING ILLNESS: 09/07/17  [name] 42 y.o. female is a here because of her BRCA1 mutation. The patient was referred by her GYN, Dr. Merrily Brittle. The patient presents to the clinic today accompanied by her husband.  She has a family history of ovarian cancer in her maternal aunt in her 69s, prostate cancer in her maternal grandfather in his 55s and patient completed genetic testing and was found to be BRCA1 mutation positive. She also mentioned her maternal grandmother had lung and colon cancer. The patients mother has 1 brother and 1 sister and is low suspicion for BRCA carrier. She notes she only has 1 brother. She plans to have total hysterectomy and BSO on 11/11/17. Her last mammogram was 08/11/17 at Dr. Ophelia Charter office.  Today she notes she sees by her PCP Dr. Jacelyn Grip for 4 years now and will see him again in 09/2017. Overall she feels well. She manages her general pain well.  Socially she is married with 3 daughters and works as a Fish farm manager.   She notes she had DM and since she lost weight post gastric bypass her sugar is at normal levels and she no longer has GERD. She notes arthritis and is not treating this. 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 She had 3 c-sections and does not plan to have anymore children. She had her gallbladder removed.  She does not drink and she is a non-smoker. She gets Botox for migraine every 12 weeks.   GYN HISTORY  Menarchal: 9-10 LMP: before 01/2017 Contraceptive: Mirena IUD since 01/2017 HRT: NA  [id number]: 20,71, 41 year old girls    MEDICAL HISTORY:  Past Medical History:  Diagnosis Date  . ADD (attention deficit disorder)   . Allergy    Seasonal  . Annular disc tear   . Anxiety   . Bulging disc   . Chronic pain 04/20/13   Medication prescribed by Vira Blanco, M.D.  . Degenerative disc disease   . Depression   . Diabetes mellitus   . Diabetic neuropathy ([place])   . Herniated disc   . Hyperlipemia   . 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 Retrolisthesis    L4 and L5  . Seizures (HCC)    hx of / month ago - dehydration   . Shortness of breath    with exertion   . Sleep apnea    obsructive settings at 13     SURGICAL HISTORY: Past Surgical History:  Procedure Laterality Date  . BOWEL RESECTION  11/30/2011   Procedure: SMALL BOWEL RESECTION;  Surgeon: Edward Jolly, MD;  Location: [facility];  Service: General;;  . BREATH TEK H PYLORI  09/11/2011   Procedure: BREATH TEK H PYLORI;  Surgeon: Edward Jolly, MD;  Location: [facility];  Service: General;  Laterality: N/A;  . CESAREAN SECTION    .  CHOLECYSTECTOMY    . ESOPHAGOGASTRODUODENOSCOPY  03/14/2012   Procedure: ESOPHAGOGASTRODUODENOSCOPY (EGD);  Surgeon: Shann Medal, MD;  Location: [facility];  Service: General;  Laterality: N/A;  . ESOPHAGOGASTRODUODENOSCOPY  04/25/2012   Procedure: ESOPHAGOGASTRODUODENOSCOPY (EGD);  Surgeon: Shann Medal, MD;  Location: [facility];  Service: General;  Laterality: N/A;  . ESOPHAGOGASTRODUODENOSCOPY N/A 03/31/2013   Procedure: ESOPHAGOGASTRODUODENOSCOPY (EGD);  Surgeon: Shann Medal, MD;  Location: [facility];  Service: General;  Laterality: N/A;  . ESOPHAGOGASTRODUODENOSCOPY N/A 04/09/2014   Procedure: ESOPHAGOGASTRODUODENOSCOPY  (EGD);  Surgeon: Alphonsa Overall, MD;  Location: [facility];  Service: General;  Laterality: N/A;  . GASTRIC ROUX-EN-Y  11/30/2011   Procedure: LAPAROSCOPIC ROUX-EN-Y GASTRIC;  Surgeon: Edward Jolly, MD;  Location: [facility];  Service: General;  Laterality: N/A;  . laporoscopy  2007  . TONSILLECTOMY      SOCIAL HISTORY: Social History   Socioeconomic History  . Marital status: Married    Spouse name: Not on file  . Number of children: Not on file  . Years of education: Not on file  . Highest education level: Not on file  Occupational History  . Not on file  Social Needs  . Financial resource strain: Not on file  . Food insecurity:    Worry: Not on file    Inability: Not on file  . Transportation needs:    Medical: Not on file    Non-medical: Not on file  Tobacco Use  . Smoking status: Never Smoker  . Smokeless tobacco: Never Used  Substance and Sexual Activity  . Alcohol use: No  . Drug use: No  . Sexual activity: Yes    Birth control/protection: IUD  Lifestyle  . Physical activity:    Days per week: Not on file    Minutes per session: Not on file  . Stress: Not on file  Relationships  . Social connections:    Talks on phone: Not on file    Gets together: Not on file    Attends religious service: Not on file    Active member of club or organization: Not on file    Attends meetings of clubs or organizations: Not on file    Relationship status: Not on file  . Intimate partner violence:    Fear of current or ex partner: Not on file    Emotionally abused: Not on file    Physically abused: Not on file    Forced sexual activity: Not on file  Other Topics Concern  . Not on file  Social History Narrative  . Not on file    FAMILY HISTORY: Family History  Problem Relation Age of Onset  . Diabetes Mother   . Hypertension Mother   . Depression Mother   . OCD Mother   . Depression Brother   . Cancer Maternal Aunt 25       ovarian   . Depression Maternal Aunt     . Diabetes Maternal Aunt   . Cancer Maternal Grandmother        Lung  . COPD Maternal Grandmother   . Cancer Maternal Grandfather 75       Prostate  . Diabetes Maternal Grandfather   . Hearing loss Maternal Grandfather   . Hypertension Maternal Grandfather   . Stroke Maternal Grandfather   . Cancer Paternal Grandmother 13       Colon  . Diabetes type II Unknown     ALLERGIES:  is allergic to ace inhibitors; atarax [hydroxyzine hcl]; cephalosporins;  codeine; compazine; decadron [dexamethasone]; demerol; hydroxyzine hcl; metoclopramide hcl; morphine and related; ondansetron; other; penicillins; pork-derived products; prozac [fluoxetine hcl]; shellfish-derived products; stevia glycerite extract [flavoring agent]; sulfa antibiotics; triptans; zanaflex [tizanidine hcl]; zofran; dihydroergotamine; and periactin [cyproheptadine].  MEDICATIONS:  Current Outpatient Medications  Medication Sig Dispense Refill  . CALCIUM-VITAMIN D PO Take 1 tablet by mouth 3 (three) times daily.     . Cyanocobalamin (VITAMIN B-12 PO) Take 1 tablet by mouth once a week.    Marland Kitchen EPINEPHrine (EPI-PEN) 0.3 mg/0.3 mL SOAJ injection Inject 0.3 mg into the muscle daily as needed (allergic reaction).     . Multiple Vitamin (MULTIVITAMIN WITH MINERALS) TABS tablet Take 1 tablet by mouth 2 (two) times daily.     . OnabotulinumtoxinA (BOTOX IJ) Inject 1 each as directed every 3 (three) months. For migraines     No current facility-administered medications for this visit.     REVIEW OF SYSTEMS:   Constitutional: Denies fevers, chills or abnormal night sweats Eyes: Denies blurriness of vision, double vision or watery eyes Ears, nose, mouth, throat, and face: Denies mucositis or sore throat Respiratory: Denies cough, dyspnea or wheezes Cardiovascular: Denies palpitation, chest discomfort or lower extremity swelling Gastrointestinal:  Denies nausea, heartburn or change in bowel habits Skin: Denies abnormal skin  rashes Lymphatics: Denies new lymphadenopathy or easy bruising Neurological:Denies numbness, tingling or new weaknesses Behavioral/Psych: Mood is stable, no new changes  All other systems were reviewed with the patient and are negative.  PHYSICAL EXAMINATION: ECOG PERFORMANCE STATUS: 0 - Asymptomatic  Vitals:   09/07/17 1451  BP: 119/72  Pulse: 76  Resp: 18  Temp: 98 F (36.7 C)  SpO2: 100%   Filed Weights   09/07/17 1451  Weight: 178 lb 4.8 oz (80.9 kg)    GENERAL:alert, no distress and comfortable SKIN: skin color, texture, turgor are normal, no rashes or significant lesions EYES: normal, conjunctiva are pink and non-injected, sclera clear OROPHARYNX:no exudate, no erythema and lips, buccal mucosa, and tongue normal  NECK: supple, thyroid normal size, non-tender, without nodularity LYMPH:  no palpable lymphadenopathy in the cervical, axillary or inguinal LUNGS: clear to auscultation and percussion with normal breathing effort HEART: regular rate & rhythm and no murmurs and no lower extremity edema ABDOMEN:abdomen soft, non-tender and normal bowel sounds Musculoskeletal:no cyanosis of digits and no clubbing  PSYCH: alert & oriented x 3 with fluent speech NEURO: no focal motor/sensory deficits BREAST: Breast inspection showed them to be symmetrical with no nipple discharge. Palpation of the breasts and axilla revealed no obvious mass that I could appreciate. (+) she has mild tender around left nipple  LABORATORY DATA:  I have reviewed the data as listed CBC Latest Ref Rng & Units 01/30/2016 07/11/2015 04/09/2014  WBC 4.0 - 10.5 K/uL 12.4(H) 8.3 5.8  Hemoglobin 12.0 - 15.0 g/dL 12.7 13.6 11.6(L)  Hematocrit 36.0 - 46.0 % 39.9 40.8 37.0  Platelets 150 - 400 K/uL 246 267 172    CMP Latest Ref Rng & Units 01/30/2016 07/11/2015 04/09/2014  Glucose 65 - 99 mg/dL 93 99 99  BUN 6 - 20 mg/dL 12 13 <5(L)  Creatinine 0.44 - 1.00 mg/dL 0.66 0.73 0.69  Sodium 135 - 145 mmol/L 140 141  142  Potassium 3.5 - 5.1 mmol/L 4.6 4.4 4.1  Chloride 101 - 111 mmol/L 106 106 111  CO2 22 - 32 mmol/L '26 28 28  '$ Calcium 8.9 - 10.3 mg/dL 9.1 9.0 8.1(L)  Total Protein 6.5 - 8.1 g/dL 7.6 - -  Total Bilirubin 0.3 - 1.2 mg/dL 0.5 - -  Alkaline Phos 38 - 126 U/L 65 - -  AST 15 - 41 U/L 18 - -  ALT 14 - 54 U/L 17 - -           RADIOGRAPHIC STUDIES: I have personally reviewed the radiological images as listed and agreed with the findings in the report. No results found.  ASSESSMENT & PLAN:  [name] is a 42 y.o. caucasian female with a history of prescription pain med addiction, GERD, DM and depression, now resolved.    1. BRCA1 mutation carrier, Heterozygous -We discussed extensively about the risks of breast cancer (57-65% by age of 72) and ovarian cancer (40-59% by age of 27), and BRCA1 mutations are also associated with earlier onset disease, particularly before age 8. -due to the high risk of breast and ovary cancer, I recommend prophylactic bilateral mastectomy and salpingo-oophorectomies, which can reduce her risk of breast cancer and overlying cancer by more than 95%. She is scheduled to have total hysterectomy and BSO on 11/11/17 by Dr. Radene Knee  -If she does not want prophylactic bilateral mastectomy, I recommend screening mammogram and breast MRI alternating every 6 months to detect early stage breast cancer. I discussed BRCA1 mutations often causes more aggressive Triple negative breast cancer and screening mammograms may not detect them early enough.  -I also explained her mastectomy options if she chooses to proceed I recommend a surgical opinion from general surgeon and plastic surgeon.  We discussed nipple sparing mastectomy, and reconstruction options.  If she decides to proceed with surgery she should do this sooner than later.  -After lengthy discussion about prophylactic mastectomy, she finally agreed to see breast surgeon Dr. Excell Seltzer, who did her gastric bypass  surgery several years ago. -She is at increased risk of pancreatic cancer, but no family history of pancreatic cancer, she is not qualified for routine pancreatic cancer screening. I recommend watching for any concerning LUQ pain. I also recommend she stay up to date with her age appropriate cancer screenings, including colonoscopies.  -I discussed having her daughters genetically tested when theyreach adult age as they have 50% change to have BRCA1 mutation. I also encouraged her to get her brother tested as he could be at increased risk for prostate cancer.  -We also discussed chemoprevention with use of tamoxifen 20 mg daily to help prevent breast cancer risk, although the benefit is less clear in genetic related breast cancer. If she proceeds with mastectomy then she does not need it.  -I also discussed lifestyle changes to lower her risk of cancer by regular exercise, balanced healthy diet and managing her overall health with routine PCP and physician followups.  -Her 08/11/17 3D Screening mammogram was benign. -f/u in 6 months and if she has the above surgeries I may only see her back as needed in the future.     PLAN:  -refer to Dr. Excell Seltzer for prophylactic bilateral mastectomy -Follow-up in 6 months, OK to cancel if she has had mastectomy done by then   All questions were answered. The patient knows to call the clinic with any problems, questions or concerns. I spent 30 minutes counseling the patient face to face. The total time spent in the appointment was 45 minutes and more than 50% was on counseling.     Truitt Merle, MD 09/07/2017    I, Joslyn Devon, am acting as scribe for Truitt Merle, MD.   I have reviewed the above documentation for  accuracy and completeness, and I agree with the above.

## 2017-09-13 ENCOUNTER — Encounter (HOSPITAL_COMMUNITY): Payer: Self-pay | Admitting: Emergency Medicine

## 2017-09-13 DIAGNOSIS — K649 Unspecified hemorrhoids: Secondary | ICD-10-CM | POA: Diagnosis not present

## 2017-09-13 DIAGNOSIS — D649 Anemia, unspecified: Secondary | ICD-10-CM | POA: Diagnosis not present

## 2017-09-13 DIAGNOSIS — R110 Nausea: Secondary | ICD-10-CM | POA: Insufficient documentation

## 2017-09-13 DIAGNOSIS — Z79899 Other long term (current) drug therapy: Secondary | ICD-10-CM | POA: Insufficient documentation

## 2017-09-13 DIAGNOSIS — K6289 Other specified diseases of anus and rectum: Secondary | ICD-10-CM | POA: Diagnosis present

## 2017-09-13 DIAGNOSIS — E119 Type 2 diabetes mellitus without complications: Secondary | ICD-10-CM | POA: Diagnosis not present

## 2017-09-13 DIAGNOSIS — K602 Anal fissure, unspecified: Secondary | ICD-10-CM | POA: Insufficient documentation

## 2017-09-13 DIAGNOSIS — K644 Residual hemorrhoidal skin tags: Secondary | ICD-10-CM | POA: Diagnosis not present

## 2017-09-13 NOTE — ED Triage Notes (Addendum)
Patient c/o abscess to perineum since yesterday. Reports chills today with nausea. Denies V/D. Reports hx BRCA 1. Reports scheduled hysterectomy in August.

## 2017-09-14 ENCOUNTER — Emergency Department (HOSPITAL_COMMUNITY)
Admission: EM | Admit: 2017-09-14 | Discharge: 2017-09-14 | Disposition: A | Discharge status: Home / Self Care | Payer: Managed Care, Other (non HMO) | Attending: Emergency Medicine | Admitting: Emergency Medicine

## 2017-09-14 DIAGNOSIS — D649 Anemia, unspecified: Secondary | ICD-10-CM

## 2017-09-14 DIAGNOSIS — K602 Anal fissure, unspecified: Secondary | ICD-10-CM

## 2017-09-14 DIAGNOSIS — K644 Residual hemorrhoidal skin tags: Secondary | ICD-10-CM

## 2017-09-14 DIAGNOSIS — R110 Nausea: Secondary | ICD-10-CM

## 2017-09-14 LAB — CBC WITH DIFFERENTIAL/PLATELET
Basophils Absolute: 0 10*3/uL (ref 0.0–0.1)
Basophils Relative: 0 %
Eosinophils Absolute: 0.1 10*3/uL (ref 0.0–0.7)
Eosinophils Relative: 2 %
HCT: 32.4 % — ABNORMAL LOW (ref 36.0–46.0)
Hemoglobin: 10.1 g/dL — ABNORMAL LOW (ref 12.0–15.0)
Lymphocytes Relative: 40 %
Lymphs Abs: 3.1 10*3/uL (ref 0.7–4.0)
MCH: 23.7 pg — ABNORMAL LOW (ref 26.0–34.0)
MCHC: 31.2 g/dL (ref 30.0–36.0)
MCV: 75.9 fL — ABNORMAL LOW (ref 78.0–100.0)
Monocytes Absolute: 0.5 10*3/uL (ref 0.1–1.0)
Monocytes Relative: 6 %
Neutro Abs: 4.1 10*3/uL (ref 1.7–7.7)
Neutrophils Relative %: 52 %
Platelets: 236 10*3/uL (ref 150–400)
RBC: 4.27 MIL/uL (ref 3.87–5.11)
RDW: 14.3 % (ref 11.5–15.5)
WBC: 7.8 10*3/uL (ref 4.0–10.5)

## 2017-09-14 LAB — POC URINE PREG, ED: Preg Test, Ur: NEGATIVE

## 2017-09-14 LAB — BASIC METABOLIC PANEL
Anion gap: 7 (ref 5–15)
BUN: 16 mg/dL (ref 6–20)
CO2: 24 mmol/L (ref 22–32)
Calcium: 8.3 mg/dL — ABNORMAL LOW (ref 8.9–10.3)
Chloride: 109 mmol/L (ref 98–111)
Creatinine, Ser: 0.63 mg/dL (ref 0.44–1.00)
GFR calc Af Amer: >60 mL/min (ref >60)
GFR calc non Af Amer: >60 mL/min (ref >60)
Glucose, Bld: 108 mg/dL — ABNORMAL HIGH (ref 70–99)
Potassium: 3.4 mmol/L — ABNORMAL LOW (ref 3.5–5.1)
Sodium: 140 mmol/L (ref 135–145)

## 2017-09-14 LAB — URINALYSIS, ROUTINE W REFLEX MICROSCOPIC
BILIRUBIN URINE: NEGATIVE
GLUCOSE, UA: NEGATIVE mg/dL
HGB URINE DIPSTICK: NEGATIVE
KETONES UR: NEGATIVE mg/dL
Leukocytes, UA: NEGATIVE
NITRITE: NEGATIVE
PH: 5 (ref 5.0–8.0)
Protein, ur: NEGATIVE mg/dL
Specific Gravity, Urine: 1.02 (ref 1.005–1.030)

## 2017-09-14 LAB — I-STAT CG4 LACTIC ACID, ED: Lactic Acid, Venous: 0.71 mmol/L (ref 0.5–1.9)

## 2017-09-14 MED ORDER — PROMETHAZINE HCL 25 MG PO TABS
25.0000 mg | ORAL_TABLET | Freq: Once | ORAL | Status: AC
  Administered 2017-09-14: 25 mg via ORAL
  Filled 2017-09-14: qty 1

## 2017-09-14 MED ORDER — KETOROLAC TROMETHAMINE 30 MG/ML IJ SOLN
30.0000 mg | Freq: Once | INTRAMUSCULAR | Status: AC
Start: 2017-09-14 — End: 2017-09-14
  Administered 2017-09-14: 30 mg via INTRAVENOUS
  Filled 2017-09-14: qty 1

## 2017-09-14 MED ORDER — LIDOCAINE 4 % EX CREA
TOPICAL_CREAM | Freq: Once | CUTANEOUS | Status: DC
  Filled 2017-09-14: qty 5

## 2017-09-14 MED ORDER — LIDOCAINE 5 % EX OINT
TOPICAL_OINTMENT | Freq: Once | CUTANEOUS | Status: AC
  Administered 2017-09-14: 1 via TOPICAL
  Filled 2017-09-14: qty 35.44

## 2017-09-14 MED ORDER — PROMETHAZINE HCL 12.5 MG PO TABS: 12.5000 mg | ORAL_TABLET | Freq: Four times a day (QID) | ORAL | 0 refills | Status: DC | PRN

## 2017-09-14 MED ORDER — HYDROCORTISONE 1 % EX CREA: TOPICAL_CREAM | CUTANEOUS | 0 refills | Status: DC

## 2017-09-14 NOTE — ED Provider Notes (Signed)
[facility] DEPT Provider Note   CSN: [id number] Arrival date & time: 09/13/17  2041     History   Chief Complaint Chief Complaint  Patient presents with  . Abscess    HPI [name] is a 42 y.o. female.  HPI [name] is a 42 y.o. female sure of chronic pain, presents to emergency department with complaint of rectal pain, as well as dizziness, weakness, abdominal pain, nausea.  She states her symptoms began today.  She states she felt a bump to her rectum onset today that is very tender.  Denies any constipation or straining.  No fever or chills.  She states she just does not feel good.  No urinary symptoms.  No vomiting or diarrhea.  She states she has been feeling very weak and dizzy.  Past Medical History:  Diagnosis Date  . ADD (attention deficit disorder)   . Allergy    Seasonal  . Annular disc tear   . Anxiety   . Bulging disc   . Chronic pain 04/20/13   Medication prescribed by Vira Blanco, M.D.  . Degenerative disc disease   . Depression   . Diabetes mellitus   . Diabetic neuropathy ([place])   . Herniated disc   . Hyperlipemia   . 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 Retrolisthesis    L4 and L5  . Seizures (HCC)    hx of / month ago - dehydration   . Shortness of breath    with exertion   . Sleep apnea    obsructive settings at 13     Patient Active Problem List   Diagnosis Date Noted  . BRCA1 gene mutation positive in female 09/07/2017  . Abdominal pain 04/06/2014  . Adjustment disorder with mixed anxiety and depressed mood 04/27/2013  . Generalized anxiety disorder 04/26/2013  . Abdominal pain, left upper quadrant 03/29/2013  . Depression 12/28/2012  . History of prescription drug abuse 12/09/2012  . Acute encephalopathy 10/03/2012  . Other and unspecified postsurgical nonabsorption 05/19/2012  . Follow-up examination, following other surgery 05/19/2012  .  Marginal ulcer 04/15/2012  . 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 LUQ abdominal pain 03/12/2012  . Diabetes mellitus ([place]) 01/19/2012  . OSA on CPAP 01/19/2012  . History of migraine 01/19/2012  . Morbid obesity ([place]) 08/20/2011  . Chest pain 07/28/2011  . Hemiplegia affecting left nondominant side ([place]) 07/28/2011  . 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 Conversion disorder 03/10/2011  . Abdominal pain 03/10/2011  . Diarrhea 03/10/2011    Past Surgical History:  Procedure Laterality Date  . BOWEL RESECTION  11/30/2011   Procedure: SMALL BOWEL RESECTION;  Surgeon: Edward Jolly, MD;  Location: [facility];  Service: General;;  . BREATH TEK H PYLORI  09/11/2011   Procedure: BREATH TEK H PYLORI;  Surgeon: Edward Jolly, MD;  Location: [facility];  Service: General;  Laterality: N/A;  . CESAREAN SECTION    . CHOLECYSTECTOMY    . ESOPHAGOGASTRODUODENOSCOPY  03/14/2012   Procedure: ESOPHAGOGASTRODUODENOSCOPY (EGD);  Surgeon: Shann Medal, MD;  Location: [facility];  Service: General;  Laterality: N/A;  . ESOPHAGOGASTRODUODENOSCOPY  04/25/2012   Procedure: ESOPHAGOGASTRODUODENOSCOPY (EGD);  Surgeon: Shann Medal, MD;  Location: [facility];  Service: General;  Laterality: N/A;  . ESOPHAGOGASTRODUODENOSCOPY N/A 03/31/2013   Procedure: ESOPHAGOGASTRODUODENOSCOPY (EGD);  Surgeon: Shann Medal, MD;  Location: [facility];  Service:  General;  Laterality: N/A;  . ESOPHAGOGASTRODUODENOSCOPY N/A 04/09/2014   Procedure: ESOPHAGOGASTRODUODENOSCOPY (EGD);  Surgeon: Alphonsa Overall, MD;  Location: [facility];  Service: General;  Laterality: N/A;  . GASTRIC ROUX-EN-Y  11/30/2011   Procedure: LAPAROSCOPIC ROUX-EN-Y GASTRIC;  Surgeon: Edward Jolly, MD;  Location: [facility];  Service: General;  Laterality: N/A;  . laporoscopy  2007  . TONSILLECTOMY       OB History   None      Home Medications    Prior to Admission medications   Medication Sig Start Date End Date  Taking? Authorizing Provider  CALCIUM-VITAMIN D PO Take 1 tablet by mouth 3 (three) times daily.     [provider]  Cyanocobalamin (VITAMIN B-12 PO) Take 1 tablet by mouth once a week.    [provider]  EPINEPHrine (EPI-PEN) 0.3 mg/0.3 mL SOAJ injection Inject 0.3 mg into the muscle daily as needed (allergic reaction).     [provider]  Multiple Vitamin (MULTIVITAMIN WITH MINERALS) TABS tablet Take 1 tablet by mouth 2 (two) times daily.     [provider]  OnabotulinumtoxinA (BOTOX IJ) Inject 1 each as directed every 3 (three) months. For migraines    [provider]    Family History Family History  Problem Relation Age of Onset  . Diabetes Mother   . Hypertension Mother   . Depression Mother   . OCD Mother   . Depression Brother   . Cancer Maternal Aunt 25       ovarian   . Depression Maternal Aunt   . Diabetes Maternal Aunt   . Cancer Maternal Grandmother        Lung  . COPD Maternal Grandmother   . Cancer Maternal Grandfather 75       Prostate  . Diabetes Maternal Grandfather   . Hearing loss Maternal Grandfather   . Hypertension Maternal Grandfather   . Stroke Maternal Grandfather   . Cancer Paternal Grandmother 25       Colon  . Diabetes type II Unknown     Social History Social History   Tobacco Use  . Smoking status: Never Smoker  . Smokeless tobacco: Never Used  Substance Use Topics  . Alcohol use: No  . Drug use: No     Allergies   Ace inhibitors; Atarax [hydroxyzine hcl]; Cephalosporins; Codeine; Compazine; Decadron [dexamethasone]; Demerol; Hydroxyzine hcl; Metoclopramide hcl; Morphine and related; Ondansetron; Other; Penicillins; Pork-derived products; Prozac [fluoxetine hcl]; Shellfish-derived products; Stevia glycerite extract [flavoring agent]; Sulfa antibiotics; Triptans; Zanaflex [tizanidine hcl]; Zofran; Dihydroergotamine; and Periactin [cyproheptadine]   Review of Systems Review of Systems    Constitutional: Positive for fatigue. Negative for chills and fever.  Respiratory: Negative for cough, chest tightness and shortness of breath.   Cardiovascular: Negative for chest pain, palpitations and leg swelling.  Gastrointestinal: Positive for abdominal pain, nausea and rectal pain. Negative for anal bleeding, blood in stool, diarrhea and vomiting.  Genitourinary: Negative for dysuria, flank pain, pelvic pain, vaginal bleeding, vaginal discharge and vaginal pain.  Musculoskeletal: Negative for arthralgias, myalgias, neck pain and neck stiffness.  Skin: Negative for rash.  Neurological: Positive for dizziness, weakness and light-headedness. Negative for headaches.  All other systems reviewed and are negative.    Physical Exam Updated Vital Signs BP (!) 101/53   Pulse 78   Temp 98.8 F (37.1 C) (Oral)   Resp 15   Ht '5\' 6"'$  (1.676 m)   Wt 74 kg (163 lb 1.6 oz)   SpO2  99%   BMI 26.33 kg/m   Physical Exam  Constitutional: She appears well-developed and well-nourished. No distress.  HENT:  Head: Normocephalic.  Eyes: Conjunctivae are normal.  Neck: Neck supple.  Cardiovascular: Normal rate, regular rhythm and normal heart sounds.  Pulmonary/Chest: Effort normal and breath sounds normal. No respiratory distress. She has no wheezes. She has no rales.  Abdominal: Soft. Bowel sounds are normal. She exhibits no distension. There is no tenderness. There is no rebound.  Genitourinary:  Genitourinary Comments: 1 small soft hemorrhoid, that is tender.  Also a fissure posteriorly at 12:00  Musculoskeletal: She exhibits no edema.  Neurological: She is alert.  Skin: Skin is warm and dry.  Psychiatric: She has a normal mood and affect. Her behavior is normal.  Nursing note and vitals reviewed.    ED Treatments / Results  Labs (all labs ordered are listed, but only abnormal results are displayed) Labs Reviewed  CBC WITH DIFFERENTIAL/PLATELET - Abnormal; Notable for the following  components:      Result Value   Hemoglobin 10.1 (*)    HCT 32.4 (*)    MCV 75.9 (*)    MCH 23.7 (*)    All other components within normal limits  BASIC METABOLIC PANEL - Abnormal; Notable for the following components:   Potassium 3.4 (*)    Glucose, Bld 108 (*)    Calcium 8.3 (*)    All other components within normal limits  URINALYSIS, ROUTINE W REFLEX MICROSCOPIC  I-STAT CG4 LACTIC ACID, ED  POC URINE PREG, ED  I-STAT CG4 LACTIC ACID, ED    EKG None  Radiology No results found.  Procedures Procedures (including critical care time)  Medications Ordered in ED Medications  promethazine (PHENERGAN) tablet 25 mg (has no administration in time range)  ketorolac (TORADOL) 30 MG/ML injection 30 mg (has no administration in time range)  lidocaine (LMX) 4 % cream (has no administration in time range)     Initial Impression / Assessment and Plan / ED Course  I have reviewed the triage vital signs and the nursing notes.  Pertinent labs & imaging results that were available during my care of the patient were reviewed by me and considered in my medical decision making (see chart for details).     Patient with a nonthrombosed but very tender hemorrhoid as well as a fissure.  Will treat with hydrocortisone cream, topical lidocaine, sitz bath.  Also complaining of generalized malaise, dizziness, nausea.  States she is allergic to Zofran.  Requesting Phenergan.  I will give her p.o. Phenergan here.  We will give her Toradol for pain.  Will check labs and urinalysis.  2:15 AM His blood work shows slightly decreased hemoglobin of 10.1, otherwise unremarkable labs.  We discussed starting iron supplement and father follow-up for anemia work-up.  This appears to be microcytic anemia.  Her urine analysis is unremarkable.  Electrolytes unremarkable.  Discussed treatment plan of hydrocortisone cream lidocaine cream, ibuprofen Tylenol for pain, sitz bath for treatment of her anal fissure and  hemorrhoid.  Return precautions discussed.  Patient requested prescription for Phenergan.  Vitals:   09/13/17 2133 09/13/17 2134 09/14/17 0050 09/14/17 0215  BP: 122/69  (!) 101/53 100/74  Pulse: 79  78 70  Resp: '16  15 16  '$ Temp: 98.8 F (37.1 C)     TempSrc: Oral     SpO2: 99%  99% 98%  Weight:  74 kg (163 lb 1.6 oz)    Height:  '5\' 6"'$  (1.676  m)       Final Clinical Impressions(s) / ED Diagnoses   Final diagnoses:  Anal fissure  External hemorrhoids without complication  Nausea  Anemia, unspecified type    ED Discharge Orders        Ordered    promethazine (PHENERGAN) 12.5 MG tablet  Every 6 hours PRN     09/14/17 0217    hydrocortisone cream 1 %     09/14/17 0217       Marsalis Beaulieu, [place], PA-C 09/14/17 0218    Molpus, Jenny Reichmann, MD 09/14/17 [phone]

## 2017-09-14 NOTE — Discharge Instructions (Addendum)
Sitz bath several times a day.  Apply hydrocortisone cream topically twice a day.  Apply lidocaine cream as needed for pain control.  Tylenol and Motrin for pain control.  Take Phenergan as prescribed as needed.  Follow-up with family doctor.

## 2017-10-12 DIAGNOSIS — R739 Hyperglycemia, unspecified: Secondary | ICD-10-CM | POA: Diagnosis not present

## 2017-10-12 DIAGNOSIS — D649 Anemia, unspecified: Secondary | ICD-10-CM | POA: Diagnosis not present

## 2017-10-12 DIAGNOSIS — M25511 Pain in right shoulder: Secondary | ICD-10-CM | POA: Diagnosis not present

## 2017-10-12 DIAGNOSIS — K602 Anal fissure, unspecified: Secondary | ICD-10-CM | POA: Diagnosis not present

## 2017-10-12 DIAGNOSIS — Z130 Encounter for screening for diseases of the blood and blood-forming organs and certain disorders involving the immune mechanism: Secondary | ICD-10-CM | POA: Diagnosis not present

## 2017-10-12 DIAGNOSIS — Z1501 Genetic susceptibility to malignant neoplasm of breast: Secondary | ICD-10-CM | POA: Diagnosis not present

## 2017-10-12 DIAGNOSIS — K641 Second degree hemorrhoids: Secondary | ICD-10-CM | POA: Diagnosis not present

## 2017-10-12 DIAGNOSIS — Z1509 Genetic susceptibility to other malignant neoplasm: Secondary | ICD-10-CM | POA: Diagnosis not present

## 2017-10-19 DIAGNOSIS — G43709 Chronic migraine without aura, not intractable, without status migrainosus: Secondary | ICD-10-CM | POA: Diagnosis not present

## 2017-10-21 NOTE — H&P (Signed)
NAME: [name], [name] MEDICAL RECORD [id number] ACCOUNT [id number] DATE OF BIRTH:[birth year] FACILITY: [facility] LOCATION: [facility] PHYSICIAN:Kenyada Hy Sherran Needs, MD  HISTORY AND PHYSICAL  DATE OF ADMISSION:  11/11/2017  Date of surgery is 11/11/2017 at [facility], outpatient area.  HISTORY OF PRESENT ILLNESS:  The patient is a 42 year old gravida 3, para 3 female who comes in for laparoscopic-assisted vaginal hysterectomy, bilateral salpingo-oophorectomy.  The patient has been experiencing increasing pelvic pain and discomfort  throughout the month at times.  She is sexually active with no discomfort.  She does have an IUD in place that is controlling abnormal bleeding.  She is also tested positive for the [facility] 1 that increases her risk of both breast and ovarian cancer.   Because of pain and discomfort, she now presents for laparoscopic assisted vaginal hysterectomy with bilateral salpingo-oophorectomy.  ALLERGIES:  In terms of allergies, she has no known drug allergies.  MEDICATIONS:  She is on Wellbutrin-XL 150 mg daily and supplements.  PAST MEDICAL HISTORY:  She has had a history of diabetes with pregnancy.  History of headaches in the form of migraines.  She has had 3 previous C-sections.  She has had a tonsillectomy.  Her gallbladder has also been removed and she had a laparoscopy in  2009.  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  SOCIAL HISTORY:  Reveals no tobacco or alcohol use.  FAMILY HISTORY:  Noncontributory.  REVIEW OF SYSTEMS:  Noncontributory.  PHYSICAL EXAMINATION: VITAL SIGNS:  Afebrile, stable vital signs. HEENT:  The patient is normocephalic.  Pupils equal, round, reactive to light and accommodation.  Extraocular movements are intact.  Sclerae and conjunctivae clear.  Oropharynx clear. NECK:  Without thyromegaly. BREASTS:  No discrete masses. LUNGS:  Clear. HEART:  Regular rate and rhythm.  No murmurs or gallops.  No carotid or  abdominal  bruits.   ABDOMEN:  Abdominal exam is benign.  No masses, megaly or tenderness.   PELVIC:  Normal external genitalia.  Vaginal mucosa is clear.  Cervix unremarkable.  Uterus normal size, shape and contour.  Adnexa free of mass or tenderness. EXTREMITIES:  Trace edema. NEUROLOGIC:  Grossly normal limits.  IMPRESSION: 1.  Worsening pelvic pain and discomfort with past history of endometriosis. 2.  Abnormal uterine bleeding controlled with IUD. 3.  The patient is positive for the BRACA 1 mutation.  PLAN  OF MANAGEMENT:  The patient undergo a laparoscopic-assisted vaginal hysterectomy with removal of both tubes and ovaries.  We will also do cystoscopy.  The risks of surgery have been discussed with the risk of infection.  The risk of hemorrhage that  could require transfusion with the risk of AIDS and hepatitis.  Risk of injury to adjacent organs including bladder, bowel, ureters or could require further exploratory surgery.  Risk of deep venous thrombosis and pulmonary embolus.    The patient expressed understanding of indications and risk.  AN/NUANCE  D:10/21/2017 T:10/21/2017 JOB:[id number]

## 2017-10-21 NOTE — H&P (Signed)
Patient name [name], [place] DICTATION# [id number] CSN# [id number]  [facility], MD 10/21/2017 9:01 AM

## 2017-10-22 ENCOUNTER — Encounter (HOSPITAL_BASED_OUTPATIENT_CLINIC_OR_DEPARTMENT_OTHER): Payer: Self-pay | Admitting: *Deleted

## 2017-10-26 DIAGNOSIS — Z1502 Genetic susceptibility to malignant neoplasm of ovary: Secondary | ICD-10-CM | POA: Diagnosis not present

## 2017-10-26 DIAGNOSIS — Z9884 Bariatric surgery status: Secondary | ICD-10-CM | POA: Diagnosis not present

## 2017-10-26 DIAGNOSIS — M25511 Pain in right shoulder: Secondary | ICD-10-CM | POA: Diagnosis not present

## 2017-10-26 DIAGNOSIS — D509 Iron deficiency anemia, unspecified: Secondary | ICD-10-CM | POA: Diagnosis not present

## 2017-10-26 DIAGNOSIS — K641 Second degree hemorrhoids: Secondary | ICD-10-CM | POA: Diagnosis not present

## 2017-10-26 DIAGNOSIS — R739 Hyperglycemia, unspecified: Secondary | ICD-10-CM | POA: Diagnosis not present

## 2017-10-26 DIAGNOSIS — Z1501 Genetic susceptibility to malignant neoplasm of breast: Secondary | ICD-10-CM | POA: Diagnosis not present

## 2017-10-26 DIAGNOSIS — K602 Anal fissure, unspecified: Secondary | ICD-10-CM | POA: Diagnosis not present

## 2017-10-26 DIAGNOSIS — Z1509 Genetic susceptibility to other malignant neoplasm: Secondary | ICD-10-CM | POA: Diagnosis not present

## 2017-11-03 NOTE — Patient Instructions (Signed)
[name]  11/03/2017      Your procedure is scheduled on  11-11-17   Report to [place]  at  5:30A.M.    Call this number if you have problems the morning of surgery:[phone]    OUR ADDRESS IS [address], WE ARE LOCATED IN THE [facility]ICAL [facility] WITH [facility].   Remember:  Do not eat food or drink liquids after midnight.  Take these medicines the morning of surgery with A SIP OF WATER: none   Do not wear jewelry, make-up or nail polish.  Do not wear lotions, powders, or perfumes, or deoderant.  Do not shave 48 hours prior to surgery.  Men may shave face and neck.  Do not bring valuables to the hospital.  [facility] is not responsible for any belongings or valuables.  Contacts, dentures or bridgework may not be worn into surgery.  Leave your suitcase in the car.  After surgery it may be brought to your room.  For patients admitted to the hospital, discharge time will be determined by your treatment team.   Special instructions:   Please read over the following fact sheets that you were given:       [facility] - Preparing for Surgery Before surgery, you can play an important role.  Because skin is not sterile, your skin needs to be as free of germs as possible.  You can reduce the number of germs on your skin by washing with CHG (chlorahexidine gluconate) soap before surgery.  CHG is an antiseptic cleaner which kills germs and bonds with the skin to continue killing germs even after washing. Please DO NOT use if you have an allergy to CHG or antibacterial soaps.  If your skin becomes reddened/irritated stop using the CHG and inform your nurse when you arrive at Short Stay. Do not shave (including legs and underarms) for at least 48 hours prior to the first CHG shower.  You may shave your face/neck. Please follow these instructions carefully:  1.  Shower with CHG Soap the night before surgery and the  morning of Surgery.  2.  If you  choose to wash your hair, wash your hair first as usual with your  normal  shampoo.  3.  After you shampoo, rinse your hair and body thoroughly to remove the  shampoo.                           4.  Use CHG as you would any other liquid soap.  You can apply chg directly  to the skin and wash                       Gently with a scrungie or clean washcloth.  5.  Apply the CHG Soap to your body ONLY FROM THE NECK DOWN.   Do not use on face/ open                           Wound or open sores. Avoid contact with eyes, ears mouth and genitals (private parts).                       Wash face,  Genitals (private parts) with your normal soap.             6.  Wash thoroughly, paying special attention to the  area where your surgery  will be performed.  7.  Thoroughly rinse your body with warm water from the neck down.  8.  DO NOT shower/wash with your normal soap after using and rinsing off  the CHG Soap.                9.  Pat yourself dry with a clean towel.            10.  Wear clean pajamas.            11.  Place clean sheets on your bed the night of your first shower and do not  sleep with pets. Day of Surgery : Do not apply any lotions/deodorants the morning of surgery.  Please wear clean clothes to the hospital/surgery center.  FAILURE TO FOLLOW THESE INSTRUCTIONS MAY RESULT IN THE CANCELLATION OF YOUR SURGERY PATIENT SIGNATURE_________________________________  NURSE SIGNATURE__________________________________  ________________________________________________________________________

## 2017-11-04 ENCOUNTER — Encounter (HOSPITAL_COMMUNITY): Payer: Self-pay

## 2017-11-04 ENCOUNTER — Other Ambulatory Visit: Payer: Self-pay

## 2017-11-04 ENCOUNTER — Encounter (HOSPITAL_COMMUNITY)
Admission: RE | Admit: 2017-11-04 | Discharge: 2017-11-04 | Disposition: A | Discharge status: Home / Self Care | Payer: Managed Care, Other (non HMO) | Source: Ambulatory Visit | Attending: Obstetrics and Gynecology | Admitting: Obstetrics and Gynecology

## 2017-11-04 DIAGNOSIS — Z01818 Encounter for other preprocedural examination: Secondary | ICD-10-CM | POA: Diagnosis present

## 2017-11-04 DIAGNOSIS — Z01812 Encounter for preprocedural laboratory examination: Secondary | ICD-10-CM | POA: Insufficient documentation

## 2017-11-04 DIAGNOSIS — Z0183 Encounter for blood typing: Secondary | ICD-10-CM | POA: Insufficient documentation

## 2017-11-04 DIAGNOSIS — R102 Pelvic and perineal pain: Secondary | ICD-10-CM | POA: Diagnosis not present

## 2017-11-04 DIAGNOSIS — Z8742 Personal history of other diseases of the female genital tract: Secondary | ICD-10-CM | POA: Diagnosis not present

## 2017-11-04 DIAGNOSIS — Z1501 Genetic susceptibility to malignant neoplasm of breast: Secondary | ICD-10-CM | POA: Insufficient documentation

## 2017-11-04 HISTORY — DX: Adverse effect of unspecified anesthetic, initial encounter: T41.45XA

## 2017-11-04 HISTORY — DX: Other complications of anesthesia, initial encounter: T88.59XA

## 2017-11-04 LAB — CBC
HCT: 39.4 % (ref 36.0–46.0)
HEMOGLOBIN: 12.2 g/dL (ref 12.0–15.0)
MCH: 24.4 pg — AB (ref 26.0–34.0)
MCHC: 31 g/dL (ref 30.0–36.0)
MCV: 78.6 fL (ref 78.0–100.0)
Platelets: 247 10*3/uL (ref 150–400)
RBC: 5.01 MIL/uL (ref 3.87–5.11)
RDW: 17.9 % — AB (ref 11.5–15.5)
WBC: 5.5 10*3/uL (ref 4.0–10.5)

## 2017-11-04 LAB — HCG, SERUM, QUALITATIVE: PREG SERUM: NEGATIVE

## 2017-11-04 LAB — ABO/RH: ABO/RH(D): A POS

## 2017-11-09 DIAGNOSIS — N939 Abnormal uterine and vaginal bleeding, unspecified: Secondary | ICD-10-CM | POA: Diagnosis not present

## 2017-11-09 DIAGNOSIS — R102 Pelvic and perineal pain: Secondary | ICD-10-CM | POA: Diagnosis not present

## 2017-11-09 DIAGNOSIS — Z1502 Genetic susceptibility to malignant neoplasm of ovary: Secondary | ICD-10-CM | POA: Diagnosis not present

## 2017-11-10 NOTE — Anesthesia Preprocedure Evaluation (Addendum)
Anesthesia Evaluation  Patient identified by MRN, date of birth, ID band Patient awake    Reviewed: Allergy & Precautions, NPO status , Patient's Chart, lab work & pertinent test results  History of Anesthesia Complications Negative for: history of anesthetic complications  Airway Mallampati: I  TM Distance: >3 FB Neck ROM: Full    Dental no notable dental hx. (+) Dental Advisory Given   Pulmonary sleep apnea and Continuous Positive Airway Pressure Ventilation ,    Pulmonary exam normal        Cardiovascular negative cardio ROS Normal cardiovascular exam     Neuro/Psych Seizures -, Well Controlled,  PSYCHIATRIC DISORDERS Anxiety Depression negative neurological ROS     GI/Hepatic negative GI ROS, Neg liver ROS,   Endo/Other  diabetes  Renal/GU negative Renal ROS     Musculoskeletal negative musculoskeletal ROS (+)   Abdominal   Peds  Hematology negative hematology ROS (+)   Anesthesia Other Findings Day of surgery medications reviewed with the patient.  Reproductive/Obstetrics                            Anesthesia Physical Anesthesia Plan  ASA: II  Anesthesia Plan: General   Post-op Pain Management:    Induction: Intravenous  PONV Risk Score and Plan: 4 or greater and Ondansetron, Scopolamine patch - Pre-op and Diphenhydramine  Airway Management Planned: Oral ETT  Additional Equipment:   Intra-op Plan:   Post-operative Plan: Extubation in OR  Informed Consent: I have reviewed the patients History and Physical, chart, labs and discussed the procedure including the risks, benefits and alternatives for the proposed anesthesia with the patient or authorized representative who has indicated his/her understanding and acceptance.   Dental advisory given  Plan Discussed with: Anesthesiologist and CRNA  Anesthesia Plan Comments:        Anesthesia Quick Evaluation

## 2017-11-11 ENCOUNTER — Encounter (HOSPITAL_BASED_OUTPATIENT_CLINIC_OR_DEPARTMENT_OTHER)
Admission: RE | Disposition: A | Discharge status: Home / Self Care | Payer: Self-pay | Source: Ambulatory Visit | Attending: Obstetrics and Gynecology

## 2017-11-11 ENCOUNTER — Ambulatory Visit (HOSPITAL_BASED_OUTPATIENT_CLINIC_OR_DEPARTMENT_OTHER): Payer: Managed Care, Other (non HMO) | Admitting: Anesthesiology

## 2017-11-11 ENCOUNTER — Encounter (HOSPITAL_BASED_OUTPATIENT_CLINIC_OR_DEPARTMENT_OTHER): Payer: Self-pay

## 2017-11-11 ENCOUNTER — Observation Stay (HOSPITAL_BASED_OUTPATIENT_CLINIC_OR_DEPARTMENT_OTHER)
Admission: RE | Admit: 2017-11-11 | Discharge: 2017-11-12 | Disposition: A | Discharge status: Home / Self Care | Payer: Managed Care, Other (non HMO) | Source: Ambulatory Visit | Attending: Obstetrics and Gynecology | Admitting: Obstetrics and Gynecology

## 2017-11-11 DIAGNOSIS — Z79899 Other long term (current) drug therapy: Secondary | ICD-10-CM | POA: Insufficient documentation

## 2017-11-11 DIAGNOSIS — N83202 Unspecified ovarian cyst, left side: Secondary | ICD-10-CM | POA: Diagnosis not present

## 2017-11-11 DIAGNOSIS — F329 Major depressive disorder, single episode, unspecified: Secondary | ICD-10-CM | POA: Diagnosis not present

## 2017-11-11 DIAGNOSIS — G4730 Sleep apnea, unspecified: Secondary | ICD-10-CM | POA: Diagnosis not present

## 2017-11-11 DIAGNOSIS — Z1501 Genetic susceptibility to malignant neoplasm of breast: Secondary | ICD-10-CM | POA: Diagnosis not present

## 2017-11-11 DIAGNOSIS — N83201 Unspecified ovarian cyst, right side: Secondary | ICD-10-CM | POA: Diagnosis not present

## 2017-11-11 DIAGNOSIS — E119 Type 2 diabetes mellitus without complications: Secondary | ICD-10-CM | POA: Diagnosis not present

## 2017-11-11 DIAGNOSIS — R102 Pelvic and perineal pain: Secondary | ICD-10-CM | POA: Diagnosis not present

## 2017-11-11 DIAGNOSIS — F419 Anxiety disorder, unspecified: Secondary | ICD-10-CM | POA: Insufficient documentation

## 2017-11-11 DIAGNOSIS — Z1502 Genetic susceptibility to malignant neoplasm of ovary: Secondary | ICD-10-CM | POA: Diagnosis not present

## 2017-11-11 DIAGNOSIS — N939 Abnormal uterine and vaginal bleeding, unspecified: Secondary | ICD-10-CM | POA: Diagnosis not present

## 2017-11-11 DIAGNOSIS — N938 Other specified abnormal uterine and vaginal bleeding: Secondary | ICD-10-CM | POA: Diagnosis not present

## 2017-11-11 DIAGNOSIS — D259 Leiomyoma of uterus, unspecified: Secondary | ICD-10-CM | POA: Diagnosis not present

## 2017-11-11 DIAGNOSIS — F418 Other specified anxiety disorders: Secondary | ICD-10-CM | POA: Diagnosis not present

## 2017-11-11 DIAGNOSIS — Z90710 Acquired absence of both cervix and uterus: Secondary | ICD-10-CM | POA: Diagnosis present

## 2017-11-11 HISTORY — PX: CYSTOSCOPY: SHX5120

## 2017-11-11 HISTORY — PX: LAPAROSCOPIC VAGINAL HYSTERECTOMY WITH SALPINGO OOPHORECTOMY: SHX6681

## 2017-11-11 LAB — TYPE AND SCREEN
ABO/RH(D): A POS
Antibody Screen: NEGATIVE

## 2017-11-11 SURGERY — HYSTERECTOMY, VAGINAL, LAPAROSCOPY-ASSISTED, WITH SALPINGO-OOPHORECTOMY
Anesthesia: General

## 2017-11-11 MED ORDER — MIDAZOLAM HCL 2 MG/2ML IJ SOLN
INTRAMUSCULAR | Status: AC
  Filled 2017-11-11: qty 2

## 2017-11-11 MED ORDER — NALOXONE HCL 0.4 MG/ML IJ SOLN
0.4000 mg | INTRAMUSCULAR | Status: DC | PRN
  Filled 2017-11-11: qty 1

## 2017-11-11 MED ORDER — SCOPOLAMINE 1 MG/3DAYS TD PT72
1.0000 | MEDICATED_PATCH | TRANSDERMAL | Status: DC
  Administered 2017-11-11: 1.5 mg via TRANSDERMAL
  Filled 2017-11-11: qty 1

## 2017-11-11 MED ORDER — BUPIVACAINE HCL (PF) 0.25 % IJ SOLN
INTRAMUSCULAR | Status: AC
  Filled 2017-11-11: qty 30

## 2017-11-11 MED ORDER — PROPOFOL 10 MG/ML IV BOLUS
INTRAVENOUS | Status: AC
  Filled 2017-11-11: qty 20

## 2017-11-11 MED ORDER — LIDOCAINE 2% (20 MG/ML) 5 ML SYRINGE
INTRAMUSCULAR | Status: AC
  Filled 2017-11-11: qty 5

## 2017-11-11 MED ORDER — TRAMADOL HCL 50 MG PO TABS
50.0000 mg | ORAL_TABLET | Freq: Four times a day (QID) | ORAL | Status: DC | PRN
  Administered 2017-11-12: 50 mg via ORAL
  Filled 2017-11-11: qty 1

## 2017-11-11 MED ORDER — GENTAMICIN SULFATE 40 MG/ML IJ SOLN
INTRAVENOUS | Status: AC
  Administered 2017-11-11: 340 mg via INTRAVENOUS
  Filled 2017-11-11: qty 8.5

## 2017-11-11 MED ORDER — KETOROLAC TROMETHAMINE 30 MG/ML IJ SOLN
INTRAMUSCULAR | Status: AC
  Filled 2017-11-11: qty 1

## 2017-11-11 MED ORDER — PROPOFOL 10 MG/ML IV BOLUS
INTRAVENOUS | Status: DC | PRN
  Administered 2017-11-11: 150 mg via INTRAVENOUS

## 2017-11-11 MED ORDER — SCOPOLAMINE 1 MG/3DAYS TD PT72
MEDICATED_PATCH | TRANSDERMAL | Status: AC
  Filled 2017-11-11: qty 1

## 2017-11-11 MED ORDER — SUGAMMADEX SODIUM 200 MG/2ML IV SOLN
INTRAVENOUS | Status: DC | PRN
  Administered 2017-11-11: 200 mg via INTRAVENOUS

## 2017-11-11 MED ORDER — LIDOCAINE HCL (CARDIAC) PF 100 MG/5ML IV SOSY
PREFILLED_SYRINGE | INTRAVENOUS | Status: DC | PRN
  Administered 2017-11-11: 100 mg via INTRAVENOUS

## 2017-11-11 MED ORDER — PROMETHAZINE HCL 25 MG/ML IJ SOLN
6.2500 mg | Freq: Four times a day (QID) | INTRAMUSCULAR | Status: DC | PRN
  Administered 2017-11-11 – 2017-11-12 (×2): 6.25 mg via INTRAVENOUS
  Filled 2017-11-11: qty 1

## 2017-11-11 MED ORDER — LACTATED RINGERS IV SOLN
INTRAVENOUS | Status: DC
  Filled 2017-11-11: qty 1000

## 2017-11-11 MED ORDER — HYDROMORPHONE HCL 1 MG/ML IJ SOLN
0.2500 mg | INTRAMUSCULAR | Status: DC | PRN
  Administered 2017-11-11 (×2): 0.5 mg via INTRAVENOUS
  Filled 2017-11-11: qty 0.5

## 2017-11-11 MED ORDER — SODIUM CHLORIDE 0.9 % IR SOLN
Status: DC | PRN
  Administered 2017-11-11: 3000 mL

## 2017-11-11 MED ORDER — DIPHENHYDRAMINE HCL 12.5 MG/5ML PO ELIX
12.5000 mg | ORAL_SOLUTION | Freq: Four times a day (QID) | ORAL | Status: DC | PRN
  Filled 2017-11-11: qty 5

## 2017-11-11 MED ORDER — SODIUM CHLORIDE 0.9% FLUSH
9.0000 mL | INTRAVENOUS | Status: DC | PRN
  Filled 2017-11-11: qty 10

## 2017-11-11 MED ORDER — MIDAZOLAM HCL 5 MG/5ML IJ SOLN
INTRAMUSCULAR | Status: DC | PRN
  Administered 2017-11-11 (×2): 2 mg via INTRAVENOUS

## 2017-11-11 MED ORDER — MENTHOL 3 MG MT LOZG
1.0000 | LOZENGE | OROMUCOSAL | Status: DC | PRN
  Administered 2017-11-11: 3 mg via ORAL
  Filled 2017-11-11: qty 9

## 2017-11-11 MED ORDER — IBUPROFEN 200 MG PO TABS
ORAL_TABLET | ORAL | Status: AC
  Filled 2017-11-11: qty 4

## 2017-11-11 MED ORDER — KETOROLAC TROMETHAMINE 30 MG/ML IJ SOLN
INTRAMUSCULAR | Status: DC | PRN
  Administered 2017-11-11: 30 mg via INTRAVENOUS

## 2017-11-11 MED ORDER — FENTANYL CITRATE (PF) 100 MCG/2ML IJ SOLN
INTRAMUSCULAR | Status: AC
  Filled 2017-11-11: qty 2

## 2017-11-11 MED ORDER — ROCURONIUM BROMIDE 100 MG/10ML IV SOLN
INTRAVENOUS | Status: AC
  Filled 2017-11-11: qty 1

## 2017-11-11 MED ORDER — MENTHOL 3 MG MT LOZG
1.0000 | LOZENGE | OROMUCOSAL | Status: DC | PRN
  Filled 2017-11-11: qty 9

## 2017-11-11 MED ORDER — PROMETHAZINE HCL 25 MG/ML IJ SOLN
INTRAMUSCULAR | Status: AC
  Filled 2017-11-11: qty 1

## 2017-11-11 MED ORDER — PROMETHAZINE HCL 25 MG/ML IJ SOLN
12.5000 mg | Freq: Four times a day (QID) | INTRAMUSCULAR | Status: DC | PRN
  Administered 2017-11-11: 6.25 mg via INTRAVENOUS
  Filled 2017-11-11: qty 1

## 2017-11-11 MED ORDER — SUGAMMADEX SODIUM 200 MG/2ML IV SOLN
INTRAVENOUS | Status: AC
  Filled 2017-11-11: qty 2

## 2017-11-11 MED ORDER — DIPHENHYDRAMINE HCL 50 MG/ML IJ SOLN
12.5000 mg | Freq: Four times a day (QID) | INTRAMUSCULAR | Status: DC | PRN
  Filled 2017-11-11: qty 0.25

## 2017-11-11 MED ORDER — ROCURONIUM BROMIDE 100 MG/10ML IV SOLN
INTRAVENOUS | Status: DC | PRN
  Administered 2017-11-11: 70 mg via INTRAVENOUS
  Administered 2017-11-11: 10 mg via INTRAVENOUS

## 2017-11-11 MED ORDER — HYDROMORPHONE 1 MG/ML IV SOLN
INTRAVENOUS | Status: DC
  Administered 2017-11-11: 0.4 mg via INTRAVENOUS
  Administered 2017-11-11: 11:00:00 via INTRAVENOUS
  Administered 2017-11-11 – 2017-11-12 (×3): 1 mg via INTRAVENOUS
  Filled 2017-11-11 (×2): qty 25

## 2017-11-11 MED ORDER — IBUPROFEN 800 MG PO TABS
800.0000 mg | ORAL_TABLET | Freq: Three times a day (TID) | ORAL | Status: DC | PRN
  Administered 2017-11-11: 800 mg via ORAL
  Filled 2017-11-11: qty 1

## 2017-11-11 MED ORDER — BUPIVACAINE HCL (PF) 0.25 % IJ SOLN
INTRAMUSCULAR | Status: DC | PRN
  Administered 2017-11-11: 5 mL

## 2017-11-11 MED ORDER — PROMETHAZINE HCL 25 MG/ML IJ SOLN
INTRAMUSCULAR | Status: DC | PRN
  Administered 2017-11-11: 12.5 mg via INTRAVENOUS

## 2017-11-11 MED ORDER — MENTHOL 3 MG MT LOZG
LOZENGE | OROMUCOSAL | Status: AC
  Filled 2017-11-11: qty 9

## 2017-11-11 MED ORDER — LACTATED RINGERS IV SOLN
INTRAVENOUS | Status: DC
  Administered 2017-11-11 (×2): via INTRAVENOUS
  Filled 2017-11-11: qty 1000

## 2017-11-11 MED ORDER — HYDROMORPHONE HCL 1 MG/ML IJ SOLN
INTRAMUSCULAR | Status: AC
  Filled 2017-11-11: qty 1

## 2017-11-11 MED ORDER — EPHEDRINE SULFATE 50 MG/ML IJ SOLN
INTRAMUSCULAR | Status: DC | PRN
  Administered 2017-11-11 (×2): 10 mg via INTRAVENOUS

## 2017-11-11 MED ORDER — FENTANYL CITRATE (PF) 100 MCG/2ML IJ SOLN
INTRAMUSCULAR | Status: DC | PRN
  Administered 2017-11-11: 25 ug via INTRAVENOUS
  Administered 2017-11-11: 100 ug via INTRAVENOUS
  Administered 2017-11-11 (×3): 25 ug via INTRAVENOUS

## 2017-11-11 MED ORDER — EPHEDRINE 5 MG/ML INJ
INTRAVENOUS | Status: AC
  Filled 2017-11-11: qty 10

## 2017-11-11 SURGICAL SUPPLY — 57 items
BAG RETRIEVAL 10 (BASKET)
BLADE CLIPPER SURG (BLADE) IMPLANT
CANISTER SUCT 3000ML PPV (MISCELLANEOUS) ×6 IMPLANT
CATH ROBINSON RED A/P 16FR (CATHETERS) ×3 IMPLANT
CHLORAPREP W/TINT 26ML (MISCELLANEOUS) ×1 IMPLANT
COVER BACK TABLE 60X90IN (DRAPES) ×3 IMPLANT
COVER MAYO STAND STRL (DRAPES) ×6 IMPLANT
COVER SURGICAL LIGHT HANDLE (MISCELLANEOUS) ×1 IMPLANT
DRSG OPSITE POSTOP 3X4 (GAUZE/BANDAGES/DRESSINGS) ×3 IMPLANT
ELECT REM PT RETURN 9FT ADLT (ELECTROSURGICAL) ×3
ELECTRODE REM PT RTRN 9FT ADLT (ELECTROSURGICAL) ×2 IMPLANT
GLOVE BIO SURGEON STRL SZ7 (GLOVE) ×12 IMPLANT
GLOVE ECLIPSE 6.5 STRL STRAW (GLOVE) ×3 IMPLANT
GOWN STRL REUS W/ TWL XL LVL3 (GOWN DISPOSABLE) ×2 IMPLANT
GOWN STRL REUS W/TWL XL LVL3 (GOWN DISPOSABLE) ×1
HOLDER FOLEY CATH W/STRAP (MISCELLANEOUS) ×3 IMPLANT
KIT TURNOVER CYSTO (KITS) ×3 IMPLANT
NEEDLE INSUFFLATION 120MM (ENDOMECHANICALS) IMPLANT
NS IRRIG 500ML POUR BTL (IV SOLUTION) ×3 IMPLANT
PACK LAVH (CUSTOM PROCEDURE TRAY) ×3 IMPLANT
PACK ROBOTIC GOWN (GOWN DISPOSABLE) ×1 IMPLANT
PACK TRENDGUARD 450 HYBRID PRO (MISCELLANEOUS) IMPLANT
PAD OB MATERNITY 4.3X12.25 (PERSONAL CARE ITEMS) ×3 IMPLANT
PAD PREP 24X48 CUFFED NSTRL (MISCELLANEOUS) ×3 IMPLANT
SCISSORS LAP 5X45 EPIX DISP (ENDOMECHANICALS) IMPLANT
SEALER TISSUE G2 CVD JAW 45CM (ENDOMECHANICALS) ×3 IMPLANT
SET IRRIG TUBING LAPAROSCOPIC (IRRIGATION / IRRIGATOR) ×3 IMPLANT
SET IRRIG Y TYPE TUR BLADDER L (SET/KITS/TRAYS/PACK) ×3 IMPLANT
SOLUTION ELECTROLUBE (MISCELLANEOUS) IMPLANT
STRIP CLOSURE SKIN 1/2X4 (GAUZE/BANDAGES/DRESSINGS) ×1 IMPLANT
STRIP CLOSURE SKIN 1/4X4 (GAUZE/BANDAGES/DRESSINGS) ×1 IMPLANT
SUT VIC AB 0 CT1 18XCR BRD8 (SUTURE) ×4 IMPLANT
SUT VIC AB 0 CT1 36 (SUTURE) ×6 IMPLANT
SUT VIC AB 0 CT1 8-18 (SUTURE) ×3
SUT VIC AB 2-0 CT1 (SUTURE) IMPLANT
SUT VIC AB 2-0 SH 27 (SUTURE)
SUT VIC AB 2-0 SH 27XBRD (SUTURE) IMPLANT
SUT VIC AB 3-0 SH 27 (SUTURE)
SUT VIC AB 3-0 SH 27X BRD (SUTURE) IMPLANT
SUT VICRYL 0 UR6 27IN ABS (SUTURE) ×1 IMPLANT
SUT VICRYL 1 TIES 12X18 (SUTURE) ×3 IMPLANT
SUT VICRYL 4-0 PS2 18IN ABS (SUTURE) ×3 IMPLANT
SYR BULB IRRIGATION 50ML (SYRINGE) IMPLANT
SYS BAG RETRIEVAL 10MM (BASKET)
SYSTEM BAG RETRIEVAL 10MM (BASKET) IMPLANT
TOWEL OR 17X24 6PK STRL BLUE (TOWEL DISPOSABLE) ×6 IMPLANT
TRAY FOLEY W/BAG SLVR 14FR (SET/KITS/TRAYS/PACK) ×3 IMPLANT
TRENDGUARD 450 HYBRID PRO PACK (MISCELLANEOUS) ×3
TROCAR BALLN 12MMX100 BLUNT (TROCAR) ×1 IMPLANT
TROCAR BLADELESS OPT 12M 100M (ENDOMECHANICALS) IMPLANT
TROCAR OPTI TIP 5M 100M (ENDOMECHANICALS) ×3 IMPLANT
TROCAR XCEL DIL TIP R 11M (ENDOMECHANICALS) IMPLANT
TUBE CONNECTING 12X1/4 (SUCTIONS) IMPLANT
TUBING INSUF HEATED (TUBING) ×3 IMPLANT
WARMER LAPAROSCOPE (MISCELLANEOUS) ×3 IMPLANT
WATER STERILE IRR 3000ML UROMA (IV SOLUTION) ×3 IMPLANT
WATER STERILE IRR 500ML POUR (IV SOLUTION) ×3 IMPLANT

## 2017-11-11 NOTE — Brief Op Note (Signed)
11/11/2017  9:16 AM  PATIENT:  [name]  42 y.o. female  PRE-OPERATIVE DIAGNOSIS:  POSITIVE BRCA 1 MUTATION,PELVIC PAIN, AUB  POST-OPERATIVE DIAGNOSIS:  POSITIVE BRCA 1 MUTATION,PELVIC PAIN, AUB  PROCEDURE:  Procedure(s): LAPAROSCOPIC ASSISTED VAGINAL HYSTERECTOMY WITH SALPINGO OOPHORECTOMY (Bilateral) CYSTOSCOPY (N/A)  SURGEON:  Surgeon(s) and Role:    * Arvella Nigh, MD - Primary    * Leger, Colin Benton, MD - Assisting  PHYSICIAN ASSISTANT:   ASSISTANTS: leger   ANESTHESIA:   general  EBL:  150 mL   BLOOD ADMINISTERED:none  DRAINS: Urinary Catheter (Foley)   LOCAL MEDICATIONS USED:  XYLOCAINE   SPECIMEN:  Source of Specimen:  uterus tubes and ovaries  DISPOSITION OF SPECIMEN:  PATHOLOGY  COUNTS:  YES  TOURNIQUET:  * No tourniquets in log *  DICTATION: .Other Dictation: Dictation Number [id number]  PLAN OF CARE: Admit for overnight observation  PATIENT DISPOSITION:  PACU - hemodynamically stable.   Delay start of Pharmacological VTE agent (>24hrs) due to surgical blood loss or risk of bleeding: no

## 2017-11-11 NOTE — Op Note (Signed)
NAME: [name], MCCALL MEDICAL RECORD [id number] ACCOUNT [id number] DATE OF BIRTH:1975-08-14 FACILITY: [facility] LOCATION: [facility] PHYSICIAN:Lakethia Coppess Sherran Needs, MD  OPERATIVE REPORT  DATE OF PROCEDURE:  11/11/2017  PREOPERATIVE DIAGNOSIS:  Abnormal uterine bleeding, pelvic pain.  Positive for BRCA1 mutation.  POSTOPERATIVE DIAGNOSIS:  Abnormal uterine bleeding, pelvic pain.  Positive for BRCA1 mutation.  OPERATIVE PROCEDURE:  Laparoscopic assisted vaginal hysterectomy, bilateral salpingo-oophorectomy and cystoscopy.  SURGEON:  Darlyn Chamber, MD  ASSISTANT:  Lucillie Garfinkel, MD  ANESTHESIA:  General endotracheal.  ESTIMATED BLOOD LOSS:  200-250 mL.  PACKS:  None.  DRAINS:  Included urethral Foley.  INTRAOPERATIVE BLOOD PLACED:  None.  COMPLICATIONS:  None.  INDICATIONS:  Dictated in history and physical.  DESCRIPTION OF PROCEDURE:  The patient was taken to the OR and placed in supine position.  After satisfactory level of general endotracheal anesthesia was obtained, the patient was placed in the dorsal lithotomy position using the Allen stirrups.  The  perineum and vagina were prepped with an anesthetic solution.  A Hulka tenaculum was put in place and bladder was in and out catheterization.  The abdomen was prepped with DuraPrep.  After a period of time, the patient was draped as a sterile field.   Subumbilical incision was made with a knife.  Fascia was identified, incised sharply and the peritoneum was entered with blunt finger pressure.  The open laparoscopic trocar was put in place and secured.  The abdomen was deflated of carbon dioxide.   Laparoscope was introduced.  There was no evidence of any injury to adjacent organs.  A 5 mm trocar was put in place in suprapubic area under direct visualization.  The uterus was elevated.  The uterus, tubes, and ovaries were unremarkable.  At this  point in time, the right ovary was elevated.  The ureter could easily be identified.  The  right ovarian vasculature was cauterized and incised using the EnSeal.  Mesenteric attachments of the right tube and ovary were cauterized and incised using the  EnSeal up to the round ligament and the right round ligament was cauterized and incised.  She had some anterior uterine adhesions to the bladder flap area due to previous cesarean sections.  These were taken down using the EnSeal and blunt dissection.   At this point in time, the left tube and ovary were elevated.  The left ureter identified.  The left ovarian vasculature was cauterized and incised.  Mesenteric attachments of the left tube and ovary were cauterized and incised up to the left round  ligament.  The left round ligament was cauterized and incised.  At this point in time, the decision was to go vaginally.  Abdomen was deflated of carbon dioxide.  Laparoscope was then removed.  The patient's legs were repositioned and the Hulka tenaculum  was removed.  Weighted speculum was placed in the vaginal vault.  The cul-de-sac was entered in standard sharply.  Both uterosacral ligaments were clamped, cut and suture ligated with 0 Vicryl.  The reflection of the vaginal mucosa anteriorly was  incised and bladder was dissected superiorly.  Paracervical tissue was clamped, cut and suture ligated with 0 Vicryl.  The vesicouterine space was entered and a retractor was put in place using the clamp, cut, and tie technique with suture ligatures of 0  Vicryl.  The parametrium was serially separated from the sides of the uterus.  Uterus was then flipped.  The remaining pedicles were clamped and cut.  The uterus, tubes and ovaries were passed  off the operative field.  Held pedicles secured with free  ties of 0 Vicryl.  A weighted speculum was then removed.  A short weighted speculum was put in place and moistened tape sponge was put in place.  Vaginal cuff was then closed with interrupted figure-of-eights of 0 Vicryl.  We had good hemostasis and  adequate  closure and excellent support.  At this point in time, the legs were repositioned.  Laparoscope was reintroduced.  The abdomen was reinflated with carbon dioxide.  Areas of oozing were controlled with the EnSeal.  We thoroughly irrigated the  pelvis.  We had excellent hemostasis at all sites.  At this point in time, the abdomen was deflated of carbon dioxide.  All trocars were removed.  Subumbilical fascia closed with figure-of-eight of 0 Vicryl.  Skin was closed with subcuticular 4-0 Vicryl.   Suprapubic incision closed with a subcuticular 4-0 Vicryl.  Cystoscopy was then performed.  Bladder was intact.  Both ureteral orifices were identified and noted to have jets of clear urine.  Cystoscope was then removed.  Foley was placed straight  drain.  The patient was taken out of dorsal lithotomy position once alert and extubated and transferred to recovery room in good condition.  Sponge, instrument and needle count was correct by circulating nurse x2.  TN/NUANCE  D:11/11/2017 T:11/11/2017 JOB:[id number]

## 2017-11-11 NOTE — Transfer of Care (Signed)
Immediate Anesthesia Transfer of Care Note  Patient: [name]  Procedure(s) Performed: Procedure(s) (LRB): LAPAROSCOPIC ASSISTED VAGINAL HYSTERECTOMY WITH SALPINGO OOPHORECTOMY (Bilateral) CYSTOSCOPY (N/A)  Patient Location: PACU  Anesthesia Type: General  Level of Consciousness: awake, sedated, patient cooperative and responds to stimulation  Airway & Oxygen Therapy: Patient Spontanous Breathing and Patient connected to [place] O2  Post-op Assessment: Report given to PACU RN, Post -op Vital signs reviewed and stable and Patient moving all extremities  Post vital signs: Reviewed and stable  Complications: No apparent anesthesia complications

## 2017-11-11 NOTE — Anesthesia Postprocedure Evaluation (Signed)
Anesthesia Post Note  Patient: [name]  Procedure(s) Performed: LAPAROSCOPIC ASSISTED VAGINAL HYSTERECTOMY WITH SALPINGO OOPHORECTOMY (Bilateral ) CYSTOSCOPY (N/A )     Patient location during evaluation: PACU Anesthesia Type: General Level of consciousness: sedated Pain management: pain level controlled Vital Signs Assessment: post-procedure vital signs reviewed and stable Respiratory status: spontaneous breathing and respiratory function stable Cardiovascular status: stable Postop Assessment: no apparent nausea or vomiting Anesthetic complications: no    Last Vitals:  Vitals:   11/11/17 1045 11/11/17 1102  BP:    Pulse: 83   Resp: 15 14  Temp: 36.7 C   SpO2: 100% 100%    Last Pain:  Vitals:   11/11/17 1102  TempSrc:   PainSc: [place]

## 2017-11-11 NOTE — H&P (Signed)
  History and physical exam unchanged 

## 2017-11-11 NOTE — Progress Notes (Signed)
Patient ID: [name], female   DOB: 15-Jan-1976, 42 y.o.   MRN: [id number] Af vss Good uo Minimal bleeding Dressing dry abd soft

## 2017-11-11 NOTE — Anesthesia Procedure Notes (Signed)
Procedure Name: Intubation Date/Time: 11/11/2017 7:40 AM Performed by: Justice Rocher, CRNA Pre-anesthesia Checklist: Patient identified, Emergency Drugs available, Suction available and Patient being monitored Patient Re-evaluated:Patient Re-evaluated prior to induction Oxygen Delivery Method: Circle system utilized Preoxygenation: Pre-oxygenation with 100% oxygen Induction Type: IV induction Ventilation: Mask ventilation without difficulty Laryngoscope Size: Mac and 3 Grade View: Grade II Tube type: Oral Tube size: 7.0 mm Number of attempts: 1 Airway Equipment and Method: Stylet and Oral airway Placement Confirmation: ETT inserted through vocal cords under direct vision,  positive ETCO2 and breath sounds checked- equal and bilateral Secured at: 22 cm Tube secured with: Tape Dental Injury: Teeth and Oropharynx as per pre-operative assessment  Comments: Lips chapped preop

## 2017-11-12 ENCOUNTER — Encounter (HOSPITAL_BASED_OUTPATIENT_CLINIC_OR_DEPARTMENT_OTHER): Payer: Self-pay | Admitting: Obstetrics and Gynecology

## 2017-11-12 DIAGNOSIS — F329 Major depressive disorder, single episode, unspecified: Secondary | ICD-10-CM | POA: Diagnosis not present

## 2017-11-12 DIAGNOSIS — N83201 Unspecified ovarian cyst, right side: Secondary | ICD-10-CM | POA: Diagnosis not present

## 2017-11-12 DIAGNOSIS — F419 Anxiety disorder, unspecified: Secondary | ICD-10-CM | POA: Diagnosis not present

## 2017-11-12 DIAGNOSIS — N83202 Unspecified ovarian cyst, left side: Secondary | ICD-10-CM | POA: Diagnosis not present

## 2017-11-12 DIAGNOSIS — Z1501 Genetic susceptibility to malignant neoplasm of breast: Secondary | ICD-10-CM | POA: Diagnosis not present

## 2017-11-12 DIAGNOSIS — D259 Leiomyoma of uterus, unspecified: Secondary | ICD-10-CM | POA: Diagnosis not present

## 2017-11-12 LAB — CBC
HCT: 34.2 % — ABNORMAL LOW (ref 36.0–46.0)
Hemoglobin: 10.6 g/dL — ABNORMAL LOW (ref 12.0–15.0)
MCH: 24.7 pg — AB (ref 26.0–34.0)
MCHC: 31 g/dL (ref 30.0–36.0)
MCV: 79.7 fL (ref 78.0–100.0)
PLATELETS: 215 10*3/uL (ref 150–400)
RBC: 4.29 MIL/uL (ref 3.87–5.11)
RDW: 18.8 % — ABNORMAL HIGH (ref 11.5–15.5)
WBC: 6.1 10*3/uL (ref 4.0–10.5)

## 2017-11-12 MED ORDER — HYDROMORPHONE HCL 2 MG PO TABS
2.0000 mg | ORAL_TABLET | ORAL | Status: DC | PRN
  Administered 2017-11-12: 2 mg via ORAL
  Filled 2017-11-12: qty 1

## 2017-11-12 MED ORDER — TRAMADOL HCL 50 MG PO TABS
ORAL_TABLET | ORAL | Status: AC
  Filled 2017-11-12: qty 1

## 2017-11-12 MED ORDER — HYDROMORPHONE HCL 2 MG PO TABS
ORAL_TABLET | ORAL | Status: AC
  Filled 2017-11-12: qty 1

## 2017-11-12 MED ORDER — HYDROMORPHONE HCL 2 MG PO TABS: 2.0000 mg | ORAL_TABLET | ORAL | 0 refills | Status: DC | PRN

## 2017-11-12 MED ORDER — PROMETHAZINE HCL 25 MG/ML IJ SOLN
INTRAMUSCULAR | Status: AC
  Filled 2017-11-12: qty 1

## 2017-11-12 NOTE — Progress Notes (Signed)
Wasted 19 mg PCA Dilaudid with Trude Mcburney, RN

## 2017-11-12 NOTE — Discharge Summary (Signed)
NAME: [name], [name] MEDICAL RECORD [id number] ACCOUNT [id number] DATE OF BIRTH:1976/02/21 FACILITY: [facility] LOCATION: [facility] PHYSICIAN:Carnel Stegman Sherran Needs, MD  DISCHARGE SUMMARY  DATE OF DISCHARGE:  11/12/2017  ADMITTING DIAGNOSES:   1.  Abnormal uterine and pelvic pain. 2.  Positive BRCA1 mutation.  DISCHARGE DIAGNOSES:   1.  Abnormal uterine and pelvic pain. 2.  Positive BRCA1 mutation.  PROCEDURE:  Laparoscopic assisted vaginal hysterectomy, bilateral salpingo-oophorectomy and cystoscopy.    For complete history and physical, please see dictated note.  HOSPITAL COURSE:  The patient underwent the above surgery.  She did well postoperatively.  Her postop hemoglobin was 10.6, preoperatively it had been 12.2.  On her first postoperative day, the Foley had been discontinued and she voided without  difficulty.  She was tolerating her diet.  All incisions were intact.  She has minimal ____ pain.  Her abdominal exam was relatively benign.  ____ complications encountered during stay in the hospital.  The patient discharged in stable condition.  DISPOSITION:  The patient to avoid heavy lifting, vaginal entrance or driving a car.  She should call with fever, nausea, vomiting, excessive pain or excessive bleeding.  Instructed in signs and symptoms of deep venous thrombosis and pulmonary embolus.  Discharged home on Dilaudid as needed for pain.  Planned follow up in the office in 1 week.  TN/NUANCE D:11/12/2017 T:11/12/2017 JOB:[id number]

## 2017-11-12 NOTE — Progress Notes (Signed)
1 Day Post-Op Procedure(s) (LRB): LAPAROSCOPIC ASSISTED VAGINAL HYSTERECTOMY WITH SALPINGO OOPHORECTOMY (Bilateral) CYSTOSCOPY (N/A)  Subjective: Patient reports tolerating PO and no problems voiding.    Objective: I have reviewed patient's vital signs, medications and labs.  General: alert GI: soft, non-tender; bowel sounds normal; no masses,  no organomegaly Vaginal Bleeding: minimal  Assessment: s/p Procedure(s): LAPAROSCOPIC ASSISTED VAGINAL HYSTERECTOMY WITH SALPINGO OOPHORECTOMY (Bilateral) CYSTOSCOPY (N/A): stable  Plan: Discharge home  LOS: 0 days    Harlon Kutner 11/12/2017, 9:53 AM

## 2017-11-12 NOTE — Discharge Summary (Signed)
Patient name [name], [place] DICTATION# [id number] CSN# [id number]  Arvella Nigh, MD 11/12/2017 9:54 AM

## 2017-11-18 DIAGNOSIS — D509 Iron deficiency anemia, unspecified: Secondary | ICD-10-CM | POA: Diagnosis not present

## 2017-11-18 DIAGNOSIS — Z09 Encounter for follow-up examination after completed treatment for conditions other than malignant neoplasm: Secondary | ICD-10-CM | POA: Diagnosis not present

## 2017-11-24 DIAGNOSIS — Z09 Encounter for follow-up examination after completed treatment for conditions other than malignant neoplasm: Secondary | ICD-10-CM | POA: Diagnosis not present

## 2017-11-27 ENCOUNTER — Inpatient Hospital Stay (HOSPITAL_COMMUNITY)
Admission: AD | Admit: 2017-11-27 | Discharge: 2017-11-27 | Disposition: A | Discharge status: Home / Self Care | Payer: Managed Care, Other (non HMO) | Source: Ambulatory Visit | Attending: Obstetrics and Gynecology | Admitting: Obstetrics and Gynecology

## 2017-11-27 ENCOUNTER — Encounter (HOSPITAL_COMMUNITY): Payer: Self-pay | Admitting: *Deleted

## 2017-11-27 DIAGNOSIS — R112 Nausea with vomiting, unspecified: Secondary | ICD-10-CM | POA: Diagnosis present

## 2017-11-27 DIAGNOSIS — R109 Unspecified abdominal pain: Secondary | ICD-10-CM | POA: Insufficient documentation

## 2017-11-27 DIAGNOSIS — G8918 Other acute postprocedural pain: Secondary | ICD-10-CM | POA: Diagnosis present

## 2017-11-27 DIAGNOSIS — R197 Diarrhea, unspecified: Secondary | ICD-10-CM | POA: Insufficient documentation

## 2017-11-27 DIAGNOSIS — Z90710 Acquired absence of both cervix and uterus: Secondary | ICD-10-CM | POA: Diagnosis not present

## 2017-11-27 LAB — CBC WITH DIFFERENTIAL/PLATELET
Basophils Absolute: 0.1 10*3/uL (ref 0.0–0.1)
Basophils Relative: 1 %
Eosinophils Absolute: 0.2 10*3/uL (ref 0.0–0.7)
Eosinophils Relative: 3 %
HEMATOCRIT: 37.4 % (ref 36.0–46.0)
Hemoglobin: 11.9 g/dL — ABNORMAL LOW (ref 12.0–15.0)
LYMPHS PCT: 36 %
Lymphs Abs: 3.2 10*3/uL (ref 0.7–4.0)
MCH: 25.2 pg — ABNORMAL LOW (ref 26.0–34.0)
MCHC: 31.8 g/dL (ref 30.0–36.0)
MCV: 79.1 fL (ref 78.0–100.0)
Monocytes Absolute: 0.3 10*3/uL (ref 0.1–1.0)
Monocytes Relative: 3 %
NEUTROS ABS: 5.1 10*3/uL (ref 1.7–7.7)
NEUTROS PCT: 57 %
Platelets: 297 10*3/uL (ref 150–400)
RBC: 4.73 MIL/uL (ref 3.87–5.11)
RDW: 17.5 % — AB (ref 11.5–15.5)
WBC: 8.9 10*3/uL (ref 4.0–10.5)

## 2017-11-27 LAB — URINALYSIS, ROUTINE W REFLEX MICROSCOPIC
Bilirubin Urine: NEGATIVE
Glucose, UA: NEGATIVE mg/dL
Ketones, ur: NEGATIVE mg/dL
Nitrite: NEGATIVE
PROTEIN: NEGATIVE mg/dL
Specific Gravity, Urine: 1.009 (ref 1.005–1.030)
pH: 5 (ref 5.0–8.0)

## 2017-11-27 LAB — COMPREHENSIVE METABOLIC PANEL
ALBUMIN: 4.4 g/dL (ref 3.5–5.0)
ALT: 18 U/L (ref 0–44)
AST: 19 U/L (ref 15–41)
Alkaline Phosphatase: 56 U/L (ref 38–126)
Anion gap: 10 (ref 5–15)
BILIRUBIN TOTAL: 0.4 mg/dL (ref 0.3–1.2)
BUN: 18 mg/dL (ref 6–20)
CO2: 25 mmol/L (ref 22–32)
Calcium: 8.8 mg/dL — ABNORMAL LOW (ref 8.9–10.3)
Chloride: 103 mmol/L (ref 98–111)
Creatinine, Ser: 0.65 mg/dL (ref 0.44–1.00)
GFR calc Af Amer: >60 mL/min (ref >60)
GFR calc non Af Amer: >60 mL/min (ref >60)
GLUCOSE: 79 mg/dL (ref 70–99)
POTASSIUM: 4 mmol/L (ref 3.5–5.1)
SODIUM: 138 mmol/L (ref 135–145)
Total Protein: 6.8 g/dL (ref 6.5–8.1)

## 2017-11-27 MED ORDER — PROMETHAZINE HCL 25 MG/ML IJ SOLN
25.0000 mg | Freq: Once | INTRAMUSCULAR | Status: AC
  Administered 2017-11-27: 25 mg via INTRAVENOUS
  Filled 2017-11-27: qty 1

## 2017-11-27 MED ORDER — GI COCKTAIL ~~LOC~~
30.0000 mL | Freq: Once | ORAL | Status: AC
  Administered 2017-11-27: 30 mL via ORAL
  Filled 2017-11-27: qty 30

## 2017-11-27 MED ORDER — HYDROMORPHONE HCL 1 MG/ML IJ SOLN
1.0000 mg | Freq: Once | INTRAMUSCULAR | Status: AC
  Administered 2017-11-27: 1 mg via INTRAVENOUS
  Filled 2017-11-27: qty 1

## 2017-11-27 MED ORDER — LACTATED RINGERS IV BOLUS
1000.0000 mL | Freq: Once | INTRAVENOUS | Status: AC
  Administered 2017-11-27: 1000 mL via INTRAVENOUS

## 2017-11-27 MED ORDER — KETOROLAC TROMETHAMINE 30 MG/ML IJ SOLN
30.0000 mg | Freq: Once | INTRAMUSCULAR | Status: AC
  Administered 2017-11-27: 30 mg via INTRAVENOUS
  Filled 2017-11-27: qty 1

## 2017-11-27 NOTE — MAU Provider Note (Signed)
History     CSN: [id number]  Arrival date and time: 11/27/17 1944   First Provider Initiated Contact with Patient 11/27/17 2030      Chief Complaint  Patient presents with  .  postoperative problems  . Abdominal Pain   [name] is a 42 y.o. G3P0 who presents today with abdominal pain nausea/vomiting and diarrhea. She had a LAVH with BSO/salpingectomy on 11/11/17. She reports that she started with N/V on 9/2. On 11/24/17 she was seen in the office by Dr. Radene Knee. He wanted her sent in for IV fluids due to dehydration. She was not able to come at that time. She reports that the nausea and vomiting have been improving. Only nausea now no longer vomiting. Occasional diarrhea. She reports RLQ and epigastric abdominal pain that she rates 8/10. She had ibuprofen around 1500 today. She had phenergan around 1330 today. She denies any fever, but had had hot flashes and chills off and on.   Abdominal Pain  This is a new problem. The current episode started in the past 7 days. The onset quality is sudden. The problem has been unchanged. The pain is located in the epigastric region and RLQ. The pain is at a severity of 8/10. Associated symptoms include diarrhea, nausea and vomiting. Pertinent negatives include no dysuria. Her past medical history is significant for abdominal surgery. gastric bypass 2013.     OB History    Gravida  3   Para      Term      Preterm      AB      Living  3     SAB      TAB      Ectopic      Multiple      Live Births              Past Medical History:  Diagnosis Date  . ADD (attention deficit disorder)   . Allergy    Seasonal  . Annular disc tear    RESOLVED   . Anxiety   . Bulging disc   . Chronic pain 04/20/13   Medication prescribed by Vira Blanco, M.D.  . Complication of anesthesia    reports that blood pressure dropped post -op childbirth   . Degenerative disc disease   . Depression   . Diabetes mellitus    LIFESTYLE   . Diabetic  neuropathy ([place])   . Herniated disc   . Hyperlipemia   . 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 Retrolisthesis    L4 and L5  . Seizures ([facility])    hx of / month ago - dehydration; RESOLVED , NO REPEAT OF SEIZURE ACTIVITY   . Shortness of breath    with exertion   . Sleep apnea    obsructive settings at 13 ; RESOLVED     Past Surgical History:  Procedure Laterality Date  . BOWEL RESECTION  11/30/2011   Procedure: SMALL BOWEL RESECTION;  Surgeon: Edward Jolly, MD;  Location: [facility];  Service: General;;  . BREATH TEK H PYLORI  09/11/2011   Procedure: BREATH TEK H PYLORI;  Surgeon: Edward Jolly, MD;  Location: [facility];  Service: General;  Laterality: N/A;  . CESAREAN SECTION    . CHOLECYSTECTOMY    . CYSTOSCOPY N/A 11/11/2017   Procedure: CYSTOSCOPY;  Surgeon: Arvella Nigh, MD;  Location: [facility];  Service: Gynecology;  Laterality: N/A;  . ESOPHAGOGASTRODUODENOSCOPY  03/14/2012   Procedure: ESOPHAGOGASTRODUODENOSCOPY (EGD);  Surgeon: Shann Medal, MD;  Location: [facility];  Service: General;  Laterality: N/A;  . ESOPHAGOGASTRODUODENOSCOPY  04/25/2012   Procedure: ESOPHAGOGASTRODUODENOSCOPY (EGD);  Surgeon: Shann Medal, MD;  Location: [facility];  Service: General;  Laterality: N/A;  . ESOPHAGOGASTRODUODENOSCOPY N/A 03/31/2013   Procedure: ESOPHAGOGASTRODUODENOSCOPY (EGD);  Surgeon: Shann Medal, MD;  Location: [facility];  Service: General;  Laterality: N/A;  . ESOPHAGOGASTRODUODENOSCOPY N/A 04/09/2014   Procedure: ESOPHAGOGASTRODUODENOSCOPY (EGD);  Surgeon: Alphonsa Overall, MD;  Location: [facility];  Service: General;  Laterality: N/A;  . GASTRIC ROUX-EN-Y  11/30/2011   Procedure: LAPAROSCOPIC ROUX-EN-Y GASTRIC;  Surgeon: Edward Jolly, MD;  Location: [facility];  Service: General;  Laterality: N/A;  . HYSTERECTOMY ABDOMINAL WITH SALPINGO-OOPHORECTOMY    . LAPAROSCOPIC VAGINAL HYSTERECTOMY  WITH SALPINGO OOPHORECTOMY Bilateral 11/11/2017   Procedure: LAPAROSCOPIC ASSISTED VAGINAL HYSTERECTOMY WITH SALPINGO OOPHORECTOMY;  Surgeon: Arvella Nigh, MD;  Location: [facility];  Service: Gynecology;  Laterality: Bilateral;  . laporoscopy  2007  . TONSILLECTOMY      Family History  Problem Relation Age of Onset  . Diabetes Mother   . Hypertension Mother   . Depression Mother   . OCD Mother   . Depression Brother   . Cancer Maternal Aunt 25       ovarian   . Depression Maternal Aunt   . Diabetes Maternal Aunt   . Cancer Maternal Grandmother        Lung  . COPD Maternal Grandmother   . Cancer Maternal Grandfather 75       Prostate  . Diabetes Maternal Grandfather   . Hearing loss Maternal Grandfather   . Hypertension Maternal Grandfather   . Stroke Maternal Grandfather   . Cancer Paternal Grandmother 32       Colon  . Diabetes type II Unknown     Social History   Tobacco Use  . Smoking status: Never Smoker  . Smokeless tobacco: Never Used  Substance Use Topics  . Alcohol use: No  . Drug use: No    Allergies:  Allergies  Allergen Reactions  . Ace Inhibitors Other (See Comments)    Spaces out and triggers migraines  . Atarax [Hydroxyzine Hcl] Anaphylaxis  . Cephalosporins Anaphylaxis  . Codeine Anaphylaxis    Tolerates Dilaudid and Percocet.  . Compazine Anaphylaxis    Tolerates Phenergan.  . Decadron [Dexamethasone] Anaphylaxis  . Demerol Anaphylaxis  . Hydroxyzine Hcl Anaphylaxis  . Metoclopramide Hcl Anaphylaxis  . Morphine And Related Anaphylaxis    Tolerates Dilaudid and Percocet.  . Ondansetron Anaphylaxis  . Other Other (See Comments)    ALL STEROIDS CAUSES ULCERS; also to DERMABOND   . Penicillins Anaphylaxis    Has patient had a PCN reaction causing immediate rash, facial/tongue/throat swelling, SOB or lightheadedness with hypotension: yes Has patient had a PCN reaction causing severe rash involving mucus membranes or skin  necrosis: unknown Has patient had a PCN reaction that required hospitalization: unknown Has patient had a PCN reaction occurring within the last 10 years: no If all of the above answers are "NO", then may proceed with Cephalosporin use.   . Pork-Derived Products Other (See Comments)    Religous reasons--per pt ok to take Heparin when medically necessary  . Prozac [Fluoxetine Hcl] Other (See Comments)    Pt reports severe negative mood altering after taking.   Marland Kitchen Shellfish-Derived Products Other (See Comments)    Religous  reasons  . Stevia Glycerite Extract [Flavoring Agent] Swelling    Tongue swelling  . Sulfa Antibiotics Anaphylaxis  . Triptans Anaphylaxis    Makes migraines worse also  . Zanaflex [Tizanidine Hcl] Other (See Comments)    Loss of balance, lightheadedness, spaces out  . Zofran Anaphylaxis  . Skin Adhesives [Cyanoacrylate] Rash    Dermabond, skin peels  . Dihydroergotamine Other (See Comments)    unknown  . Periactin [Cyproheptadine] Rash    Medications Prior to Admission  Medication Sig Dispense Refill Last Dose  . docusate sodium (COLACE) 100 MG capsule Take 100 mg by mouth 3 (three) times daily.   Past Month at Unknown time  . ferrous sulfate 325 (65 FE) MG tablet Take 325 mg by mouth 3 (three) times daily with meals.   Past Month at Unknown time  . HYDROmorphone (DILAUDID) 2 MG tablet Take 1 tablet (2 mg total) by mouth every 4 (four) hours as needed for severe pain. 30 tablet 0 Past Month at Unknown time  . Multiple Vitamin (MULTIVITAMIN WITH MINERALS) TABS tablet Take 1 tablet by mouth daily. Alive Womens' Energy Multivitamin   Past Month at Unknown time  . naproxen sodium (ALEVE) 220 MG tablet Take 220 mg by mouth.   11/27/2017 at 1500  . OnabotulinumtoxinA (BOTOX IJ) Inject 1 each as directed every 3 (three) months. For migraines   Past Month at Unknown time  . promethazine (PHENERGAN) 25 MG tablet Take 25 mg by mouth every 6 (six) hours as needed for nausea or  vomiting.   11/27/2017 at 1345  . EPINEPHrine (EPI-PEN) 0.3 mg/0.3 mL SOAJ injection Inject 0.3 mg into the muscle daily as needed (allergic reaction).    More than a month at Unknown time  . NONFORMULARY OR COMPOUNDED ITEM Place 1 application rectally 4 (four) times daily. Diltiazem 2%  Cream   More than a month at Unknown time    Review of Systems  Constitutional: Positive for chills and diaphoresis.  Gastrointestinal: Positive for abdominal pain, diarrhea, nausea and vomiting.  Genitourinary: Negative for dysuria.       "cloudy urine"   Physical Exam   Blood pressure 114/70, pulse 93, temperature 98 F (36.7 C), resp. rate 16, height '5\' 6"'$  (1.676 m), weight 79.8 kg, last menstrual period 03/07/2012.  Physical Exam  Nursing note and vitals reviewed. Constitutional: She is oriented to person, place, and time. She appears well-developed and well-nourished. No distress.  HENT:  Head: Normocephalic.  Cardiovascular: Normal rate.  Respiratory: Effort normal.  GI: Soft. There is no tenderness. There is no rebound and no guarding.  Neurological: She is alert and oriented to person, place, and time.  Skin: Skin is warm and dry.  Psychiatric: She has a normal mood and affect.   Results for orders placed or performed during the hospital encounter of 11/27/17 (from the past 24 hour(s))  Urinalysis, Routine w reflex microscopic     Status: Abnormal   Collection Time: 11/27/17  8:05 PM  Result Value Ref Range   Color, Urine STRAW (A) YELLOW   APPearance CLEAR CLEAR   Specific Gravity, Urine 1.009 1.005 - 1.030   pH 5.0 5.0 - 8.0   Glucose, UA NEGATIVE NEGATIVE mg/dL   Hgb urine dipstick SMALL (A) NEGATIVE   Bilirubin Urine NEGATIVE NEGATIVE   Ketones, ur NEGATIVE NEGATIVE mg/dL   Protein, ur NEGATIVE NEGATIVE mg/dL   Nitrite NEGATIVE NEGATIVE   Leukocytes, UA MODERATE (A) NEGATIVE   RBC / HPF 0-5 0 -  5 RBC/hpf   WBC, UA 21-50 0 - 5 WBC/hpf   Bacteria, UA RARE (A) NONE SEEN   Squamous  Epithelial / LPF 0-5 0 - 5   Mucus PRESENT   CBC with Differential/Platelet     Status: Abnormal   Collection Time: 11/27/17  8:58 PM  Result Value Ref Range   WBC 8.9 4.0 - 10.5 K/uL   RBC 4.73 3.87 - 5.11 MIL/uL   Hemoglobin 11.9 (L) 12.0 - 15.0 g/dL   HCT 37.4 36.0 - 46.0 %   MCV 79.1 78.0 - 100.0 fL   MCH 25.2 (L) 26.0 - 34.0 pg   MCHC 31.8 30.0 - 36.0 g/dL   RDW 17.5 (H) 11.5 - 15.5 %   Platelets 297 150 - 400 K/uL   Neutrophils Relative % 57 %   Neutro Abs 5.1 1.7 - 7.7 K/uL   Lymphocytes Relative 36 %   Lymphs Abs 3.2 0.7 - 4.0 K/uL   Monocytes Relative 3 %   Monocytes Absolute 0.3 0.1 - 1.0 K/uL   Eosinophils Relative 3 %   Eosinophils Absolute 0.2 0.0 - 0.7 K/uL   Basophils Relative 1 %   Basophils Absolute 0.1 0.0 - 0.1 K/uL  Comprehensive metabolic panel     Status: Abnormal   Collection Time: 11/27/17  8:58 PM  Result Value Ref Range   Sodium 138 135 - 145 mmol/L   Potassium 4.0 3.5 - 5.1 mmol/L   Chloride 103 98 - 111 mmol/L   CO2 25 22 - 32 mmol/L   Glucose, Bld 79 70 - 99 mg/dL   BUN 18 6 - 20 mg/dL   Creatinine, Ser 0.65 0.44 - 1.00 mg/dL   Calcium 8.8 (L) 8.9 - 10.3 mg/dL   Total Protein 6.8 6.5 - 8.1 g/dL   Albumin 4.4 3.5 - 5.0 g/dL   AST 19 15 - 41 U/L   ALT 18 0 - 44 U/L   Alkaline Phosphatase 56 38 - 126 U/L   Total Bilirubin 0.4 0.3 - 1.2 mg/dL   GFR calc non Af Amer >60 >60 mL/min   GFR calc Af Amer >60 >60 mL/min   Anion gap 10 5 - 15    MAU Course  Procedures  MDM 9:49 PM Consult with Dr. Corinna Capra. Reviewed presenting complaint/history, physical exam and lab results. Dr. Corinna Capra does not feel CT scan is indicated at this time. Plan to treat/manage pain, hydrate patient and provide reassurance.    Patient has had 2L of LR, Phenergan, Toradol and Dilaudid. She reports that she is feeling better. She is tolerating PO at this time. Will DC home, and patient to FU with Dr. Radene Knee as needed.   Assessment and Plan   1. Postoperative pain   2.  Non-intractable vomiting with nausea, unspecified vomiting type    DC home Comfort measures reviewed  RX: no new rx. Continue current meds as needed. Patient has phenergan and dilaudid at home.  Return to MAU as needed FU with GYN as planned  Follow-up Information    Arvella Nigh, MD Follow up.   Specialty:  Obstetrics and Gynecology Contact information: [address] [place] [address] [phone]            Marcille Buffy 11/27/2017, 8:40 PM

## 2017-11-27 NOTE — MAU Note (Signed)
Pt. Reports she had surgery on 11/11/17. Reports N/V on Monday. Tuesday N/V diarrhea. Went to see [facility] on Wednesday and he wanted her admitted for IV fluids. She was supposed to call back Thursday and did not. Friday able to keep some food and fluid down. Today pain in abd and back rated 8/10. Nausea and loose stools today.

## 2017-11-27 NOTE — MAU Note (Signed)
Had lap assist vag hyst 8/22. Saw Dr Corinna Capra WEds with n/v/d. Had some back pain and abd pain. Incisions look good. Some scant vag bleeding when I wipe. Today I feel like ton of brick laying on me, abd and back hurts and having chills. Have taken Tylenol and aleve today but not helping. 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

## 2017-11-29 LAB — URINE CULTURE: Culture: <10000 — AB

## 2017-12-07 ENCOUNTER — Other Ambulatory Visit (HOSPITAL_COMMUNITY): Payer: Self-pay | Admitting: Obstetrics and Gynecology

## 2017-12-07 ENCOUNTER — Ambulatory Visit (HOSPITAL_COMMUNITY)
Admission: RE | Admit: 2017-12-07 | Discharge: 2017-12-07 | Disposition: A | Discharge status: Home / Self Care | Payer: Managed Care, Other (non HMO) | Source: Ambulatory Visit | Attending: Obstetrics and Gynecology | Admitting: Obstetrics and Gynecology

## 2017-12-07 DIAGNOSIS — Z09 Encounter for follow-up examination after completed treatment for conditions other than malignant neoplasm: Secondary | ICD-10-CM | POA: Diagnosis not present

## 2017-12-07 DIAGNOSIS — R109 Unspecified abdominal pain: Secondary | ICD-10-CM

## 2017-12-17 ENCOUNTER — Ambulatory Visit: Payer: Self-pay | Admitting: General Surgery

## 2018-01-18 ENCOUNTER — Ambulatory Visit: Payer: Self-pay | Admitting: General Surgery

## 2018-01-19 ENCOUNTER — Encounter (HOSPITAL_COMMUNITY): Payer: Self-pay

## 2018-01-19 NOTE — Pre-Procedure Instructions (Signed)
KRYSTYNE TEWKSBURY  01/19/2018      [id number] - [place], [place] [address] [place] [address] Phone: [phone] Fax: [phone]  [facility] [place], [place] [address] Phone: [phone] Fax: [phone]    Your procedure is scheduled on Friday November 8.  Report to [facility] Admitting at 7:15 A.M.  Call this number if you have problems the morning of surgery:  [phone]   Remember:  Do not eat or drink after midnight.  You may drink clear liquids until 6:15 AM (3 hours prior to surgery) .  Clear liquids allowed TKP:[id number], Juice (non-citric and without pulp), Clear Tea, Black Coffee only and Gatorade    Take these medicines the morning of surgery with A SIP OF WATER:   Bupropion (Wellbutrin) Tylenol if needed  7 days prior to surgery STOP taking any Aspirin(unless otherwise instructed by your surgeon), Aleve, Naproxen, Ibuprofen, Motrin, Advil, Goody's, BC's, all herbal medications, fish oil, and all vitamins     Do not wear jewelry, make-up or nail polish.  Do not wear lotions, powders, or perfumes, or deodorant.  Do not shave 48 hours prior to surgery.  Men may shave face and neck.  Do not bring valuables to the hospital.  [facility] is not responsible for any belongings or valuables.  Contacts, dentures or bridgework may not be worn into surgery.  Leave your suitcase in the car.  After surgery it may be brought to your room.  For patients admitted to the hospital, discharge time will be determined by your treatment team.  Patients discharged the day of surgery will not be allowed to drive home.   Special instructions:    [place]- Preparing For Surgery  Before surgery, you can play an important role. Because skin is not sterile, your skin needs to be as free of germs as possible. You can reduce the number of germs on your skin by  washing with CHG (chlorahexidine gluconate) Soap before surgery.  CHG is an antiseptic cleaner which kills germs and bonds with the skin to continue killing germs even after washing.    Oral Hygiene is also important to reduce your risk of infection.  Remember - BRUSH YOUR TEETH THE MORNING OF SURGERY WITH YOUR REGULAR TOOTHPASTE  Please do not use if you have an allergy to CHG or antibacterial soaps. If your skin becomes reddened/irritated stop using the CHG.  Do not shave (including legs and underarms) for at least 48 hours prior to first CHG shower. It is OK to shave your face.  Please follow these instructions carefully.   1. Shower the NIGHT BEFORE SURGERY and the MORNING OF SURGERY with CHG.   2. If you chose to wash your hair, wash your hair first as usual with your normal shampoo.  3. After you shampoo, rinse your hair and body thoroughly to remove the shampoo.  4. Use CHG as you would any other liquid soap. You can apply CHG directly to the skin and wash gently with a scrungie or a clean washcloth.   5. Apply the CHG Soap to your body ONLY FROM THE NECK DOWN.  Do not use on open wounds or open sores. Avoid contact with your eyes, ears, mouth and genitals (private parts). Wash Face and genitals (private parts)  with your normal soap.  6. Wash thoroughly, paying special attention to the  area where your surgery will be performed.  7. Thoroughly rinse your body with warm water from the neck down.  8. DO NOT shower/wash with your normal soap after using and rinsing off the CHG Soap.  9. Pat yourself dry with a CLEAN TOWEL.  10. Wear CLEAN PAJAMAS to bed the night before surgery, wear comfortable clothes the morning of surgery  11. Place CLEAN SHEETS on your bed the night of your first shower and DO NOT SLEEP WITH PETS.    Day of Surgery:  Do not apply any deodorants/lotions.  Please wear clean clothes to the hospital/surgery center.   Remember to brush your teeth WITH YOUR  REGULAR TOOTHPASTE.    Please read over the following fact sheets that you were given. Coughing and Deep Breathing and Surgical Site Infection Prevention

## 2018-01-20 ENCOUNTER — Encounter (HOSPITAL_COMMUNITY): Payer: Self-pay

## 2018-01-20 ENCOUNTER — Other Ambulatory Visit: Payer: Self-pay

## 2018-01-20 ENCOUNTER — Encounter (HOSPITAL_COMMUNITY)
Admission: RE | Admit: 2018-01-20 | Discharge: 2018-01-20 | Disposition: A | Discharge status: Home / Self Care | Payer: Managed Care, Other (non HMO) | Source: Ambulatory Visit | Attending: General Surgery | Admitting: General Surgery

## 2018-01-20 DIAGNOSIS — Z01812 Encounter for preprocedural laboratory examination: Secondary | ICD-10-CM | POA: Diagnosis present

## 2018-01-20 LAB — BASIC METABOLIC PANEL
ANION GAP: 6 (ref 5–15)
BUN: 10 mg/dL (ref 6–20)
CALCIUM: 9.4 mg/dL (ref 8.9–10.3)
CO2: 28 mmol/L (ref 22–32)
Chloride: 105 mmol/L (ref 98–111)
Creatinine, Ser: 0.74 mg/dL (ref 0.44–1.00)
GLUCOSE: 100 mg/dL — AB (ref 70–99)
POTASSIUM: 4.1 mmol/L (ref 3.5–5.1)
Sodium: 139 mmol/L (ref 135–145)

## 2018-01-20 LAB — CBC
HCT: 40.9 % (ref 36.0–46.0)
HEMOGLOBIN: 12.2 g/dL (ref 12.0–15.0)
MCH: 24 pg — ABNORMAL LOW (ref 26.0–34.0)
MCHC: 29.8 g/dL — ABNORMAL LOW (ref 30.0–36.0)
MCV: 80.4 fL (ref 80.0–100.0)
NRBC: 0 % (ref 0.0–0.2)
PLATELETS: 262 10*3/uL (ref 150–400)
RBC: 5.09 MIL/uL (ref 3.87–5.11)
RDW: 13.2 % (ref 11.5–15.5)
WBC: 6.7 10*3/uL (ref 4.0–10.5)

## 2018-01-20 LAB — HEMOGLOBIN A1C
HEMOGLOBIN A1C: 5.8 % — AB (ref 4.8–5.6)
MEAN PLASMA GLUCOSE: 119.76 mg/dL

## 2018-01-20 NOTE — Progress Notes (Addendum)
PCP - Dr. Yaakov Guthrie Cardiologist - denies  Chest x-ray - N/A EKG - 11/04/17 Stress Test - denies ECHO - 07/29/11 Cardiac Cath - denies  Sleep Study - previous sleep study done, no CPAP needed.   Pt denies being diabetic anymore, had gastric bypass and with weight loss no longer diabetic. Will obtain A1C to verify.    Blood Thinner Instructions: Aspirin Instructions:  Anesthesia review:   Patient denies shortness of breath, fever, cough and chest pain at PAT appointment   Patient verbalized understanding of instructions that were given to them at the PAT appointment. Patient was also instructed that they will need to review over the PAT instructions again at home before surgery.

## 2018-01-20 NOTE — H&P (Signed)
Subjective:     Patient ID: [name] is a 42 y.o. female.  HPI  Here for follow up discussion breast reconstruction prior to planned bilateral prophylactic mastectomies. 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 Has undergone TVH/BSO.  Last MMG 07/2017 at Dr. Ophelia Charter office.  No prior MRI  History Roux en Y bypass 2013. Highest wt 307 lb. Lowest weight is current and has been stable at this for years. DM, OSA, GERD resolved following this.  Prior to wt loss 42 DD, current 38 C, happy with this.  She is married with 3 daughters and works as a Fish farm manager. 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 PMH includes migraines for which she receives Botox.  Review of Systems     Objective:   Physical Exam  Cardiovascular: Normal rate.  Pulmonary/Chest: Effort normal.  Abdominal: Soft.  Soft tissue volume would be less then present breast volume  Lymphadenopathy:    She has no axillary adenopathy.  Skin:  Fitzpatrick 2   Grade 3 ptosis bilateral No masses SN to nipple R 28 L 28 cm BW R 17 L 17 (CW 14 cm) Nipple to IMF R 10 L 11 cm    Assessment:     BRCA1    Plan:     Plan bilateral SRM with immediate expander acellular dermis reconstruction. Reviewed anchor type scars and options for NAC reconstruction.  Reviewed incisions, drains, OR length, hospital stay and recovery, limitations. Discussed process of expansion and implant based risks including rupture, MRI surveillance for silicone implants, infection requiring surgery or removal, contracture. Reviewed risks mastectomy flap necrosis requiring additional surgery.  Discussed use of acellular dermis in reconstruction, cadaveric source, incorporation over several weeks, risk that if has seroma or infection can act as additional nidus for infection if not incorporated.  Discussed prepectoral vs sub pectoral reconstruction.  Discussed with patient and benefit of this is no animation deformity, may be less pain. Risk may be more visible rippling over upper poles, greater need of ADM. Reviewed pre pectoral would require larger amount acellular dermis, more drains. Discussed any type reconstruction also risks long term displacement implant and visible rippling. If prepectoral counseled I would recommend she be comfortable with silicone implants as more options that have less rippling. She agrees to prepectoral placement.  Reviewed reconstruction will be asensate and not stimulate. Reviewed additional risks including but not limited to risks mastectomy flap necrosis requiring additional surgery, seroma, hematoma, asymmetry, need to additional procedures, fat necrosis, DVT/PE, damage to adjacent structures, cardiopulmonary complications.  Notes she used Dilaudid post op hysterectomy now off, plan oral dilaudid post op and husband is in charge of dispensing. Also ok with muscle relaxant use post op.Irene Limbo, MD [facility] Plastic & Reconstructive Surgery [phone], pin [phone]

## 2018-01-27 NOTE — H&P (Signed)
History of Present Illness Marland Kitchen T. Oluwatomisin Deman MD; 01/18/2018 3:02 PM) The patient is a 42 year old female who presents with a complaint of Breast problems. Patient returns for a preop visit prior to planned bilateral prophylactic mastectomy and immediate reconstruction. Patient is well known to me status post laparoscopic Roux-en-Y gastric bypass in 2013 for morbid obesity with diabetes and GERD, very good long-term weight loss and resolution of those comorbidities. She recently discussed with her GYN A family history of ovarian cancer in a maternal aunt in her 58s. She has since learned she had a maternal grandmother with lung and colon cancer and another aunt on her father's side with breast cancer. Genetic testing was recommended and this was completed April 2019 revealing that she is BRCA1 positive. She subsequently had a TAH/BSO in the spring of this year. After previous consultation and discussion and thought she has elected prophylactic bilateral mastectomy. She has seen Dr. Iran Planas and skin sparing Wise pattern implant-based reconstruction is planned. She is having a colonoscopy later this week and having trouble prep in terms of constipation but is being followed closely by Dr. Paulita Fujita. Still reports no breast symptoms.   Problem List/Past Medical Marland Kitchen T. Rashonda Warrior, MD; 01/18/2018 3:02 PM) MASS OF SUBCUTANEOUS TISSUE OF BACK (R22.2)  POSTOP CHECK (Z09)  ABDOMINAL PAIN, ACUTE, LEFT UPPER QUADRANT (R10.12)  BRCA1 GENE MUTATION POSITIVE IN FEMALE (Z15.01, Z15.02,Z15.09)  GASTRIC BYPASS STATUS FOR OBESITY (Z98.84)  ANAL FISSURE (K60.2)   Past Surgical History Marland Kitchen T. Latricia Cerrito, MD; 01/18/2018 3:02 PM) Gallbladder Surgery - Laparoscopic  Colon Polyp Removal - Colonoscopy  Cesarean Section - Multiple  Gastric Bypass  Tonsillectomy   Diagnostic Studies History Marland Kitchen T. Monserratt Knezevic, MD; 01/18/2018 3:02 PM) Mammogram  within last year Colonoscopy  1-5 years  ago Pap Smear  1-5 years ago  Allergies (April Staton, CMA; 01/18/2018 2:13 PM) ACE Inhibitors  Atarax *ANTIANXIETY AGENTS*  Cephalexin *CEPHALOSPORINS*  Codeine Phosphate *ANALGESICS - OPIOID*  Compazine *ANTIPSYCHOTICS/ANTIMANIC AGENTS*  Decadron *CORTICOSTEROIDS*  Demerol *ANALGESICS - OPIOID*  Metoclopramide HCl *GASTROINTESTINAL AGENTS - MISC.*  Morphine Sulfate (Concentrate) *ANALGESICS - OPIOID*  Penicillamine *ASSORTED CLASSES*  PROzac *ANTIDEPRESSANTS*  Zanaflex *MUSCULOSKELETAL THERAPY AGENTS*  Zofran *ANTIEMETICS*  Dihydroergotamine Mesylate *MIGRAINE PRODUCTS*  Periactin *ANTIHISTAMINES*   Medication History (April Staton, CMA; 01/18/2018 2:13 PM) Botox (100UNIT For Solution, Injection) Active. Ferrous Sulfate (90 (18 Fe)MG Tablet, Oral) Active. Ibuprofen (200MG Tablet, Oral) Active. Multivitamin/Iron (Oral) Active. Hydrocortisone (0.25% Cream, External) Active. EpiPen (0.3MG/0.3ML Soln Auto-inj, Injection) Active. TraMADol HCl (50MG Tablet, Oral as needed) Active. Medications Reconciled  Social History Marland Kitchen T. Joss Friedel, MD; 01/18/2018 3:02 PM) Caffeine use  Coffee, Tea. Alcohol use  Occasional alcohol use. No drug use  Tobacco use  Never smoker.  Family History Marland Kitchen T. Kensleigh Gates, MD; 01/18/2018 3:02 PM) Prostate Cancer  Family Members In General. Ovarian Cancer  Family Members In General. Migraine Headache  Daughter, Family Members In General, Mother. Rectal Cancer  Family Members In General. Thyroid problems  Mother. Colon Polyps  Family Members In General. Colon Cancer  Family Members In General. Cervical Cancer  Family Members In General. Depression  Brother, Family Members In General, Father. Melanoma  Family Members In General. Ischemic Bowel Disease  Brother, Family Members In General, Father. Diabetes Mellitus  Family Members In General, Mother. Cerebrovascular Accident  Family Members In  General. Cancer  Family Members In General. Arthritis  Family Members In General.  Pregnancy / Birth History Marland Kitchen T. Soliyana Mcchristian, MD; 01/18/2018 3:02 PM) Para  3 Maternal age  21-25 Irregular periods  Gravida  3 Contraceptive History  Intrauterine device. Age at menarche  24 years.  Other Problems Marland Kitchen T. Oris Staffieri, MD; 01/18/2018 3:02 PM) Sleep Apnea  Depression  Cholelithiasis  Back Pain  Gastric Ulcer  Other disease, cancer, significant illness  Migraine Headache  Gastroesophageal Reflux Disease   Vitals (April Staton CMA; 01/18/2018 2:14 PM) 01/18/2018 2:13 PM Weight: 175.13 lb Height: 67in Body Surface Area: 1.91 m Body Mass Index: 27.43 kg/m  Temp.: 97.42F(Oral)  Pulse: 92 (Regular)  BP: 110/64 (Sitting, Left Arm, Standard)       Physical Exam Marland Kitchen T. Jonavin Seder MD; 01/18/2018 3:03 PM) The physical exam findings are as follows: Note:General: Alert, well-developed and well nourished Caucasian female, in no distress Skin: Warm and dry without rash or infection. HEENT: No palpable masses or thyromegaly. Sclera nonicteric. Breasts: Grade 3 ptosis. No palpable masses. No skin changes. No palpable adenopathy. Lymph nodes: No cervical, supraclavicular, or inguinal nodes palpable. Lungs: Breath sounds clear and equal. No wheezing or increased work of breathing. Cardiovascular: Regular rate and rhythm without murmer. No JVD or edema. Peripheral pulses intact. No carotid bruits. Abdomen: Nondistended. Soft and nontender. No masses palpable. No organomegaly. No palpable hernias. Extremities: No edema or joint swelling or deformity. No chronic venous stasis changes. Neurologic: Alert and fully oriented. Gait normal. No focal weakness. Psychiatric: Normal mood and affect. Thought content appropriate with normal judgement and insight    Assessment & Plan Marland Kitchen T. Emi Lymon MD; 01/18/2018 3:05 PM) [place] (Z15.01) Impression: Recent diagnosis of BRCA1 positivity. Has completed TAH/BSO. Colonoscopy this week. After extensive previous discussion she has elected bilateral prophylactic total mastectomy and immediate reconstruction. This is scheduled for about 10 days. We discussed the procedure and recovered in detail. She's had extensive counseling by Dr. Iran Planas. We discussed risks of surgery including anesthetic complications, bleeding, infection and wound healing problems. Discussed expected recovery. All her questions were answered. Current Plans Schedule for Surgery Bilateral total mastectomy to be followed by immediate reconstruction

## 2018-01-28 ENCOUNTER — Inpatient Hospital Stay (HOSPITAL_COMMUNITY): Payer: Managed Care, Other (non HMO) | Admitting: Certified Registered Nurse Anesthetist

## 2018-01-28 ENCOUNTER — Other Ambulatory Visit: Payer: Self-pay

## 2018-01-28 ENCOUNTER — Observation Stay (HOSPITAL_COMMUNITY)
Admission: RE | Admit: 2018-01-28 | Discharge: 2018-01-29 | Disposition: A | Discharge status: Home / Self Care | Payer: Managed Care, Other (non HMO) | Source: Ambulatory Visit | Attending: Plastic Surgery | Admitting: Plastic Surgery

## 2018-01-28 ENCOUNTER — Encounter (HOSPITAL_COMMUNITY)
Admission: RE | Disposition: A | Discharge status: Home / Self Care | Payer: Self-pay | Source: Ambulatory Visit | Attending: Plastic Surgery

## 2018-01-28 ENCOUNTER — Encounter (HOSPITAL_COMMUNITY): Payer: Self-pay | Admitting: *Deleted

## 2018-01-28 DIAGNOSIS — N6022 Fibroadenosis of left breast: Secondary | ICD-10-CM | POA: Diagnosis not present

## 2018-01-28 DIAGNOSIS — Z421 Encounter for breast reconstruction following mastectomy: Secondary | ICD-10-CM | POA: Diagnosis not present

## 2018-01-28 DIAGNOSIS — Z79899 Other long term (current) drug therapy: Secondary | ICD-10-CM | POA: Insufficient documentation

## 2018-01-28 DIAGNOSIS — Z888 Allergy status to other drugs, medicaments and biological substances status: Secondary | ICD-10-CM | POA: Diagnosis not present

## 2018-01-28 DIAGNOSIS — Z882 Allergy status to sulfonamides status: Secondary | ICD-10-CM | POA: Diagnosis not present

## 2018-01-28 DIAGNOSIS — Z880 Allergy status to penicillin: Secondary | ICD-10-CM | POA: Insufficient documentation

## 2018-01-28 DIAGNOSIS — G8918 Other acute postprocedural pain: Secondary | ICD-10-CM | POA: Diagnosis not present

## 2018-01-28 DIAGNOSIS — Z881 Allergy status to other antibiotic agents status: Secondary | ICD-10-CM | POA: Diagnosis not present

## 2018-01-28 DIAGNOSIS — Z1501 Genetic susceptibility to malignant neoplasm of breast: Secondary | ICD-10-CM | POA: Diagnosis not present

## 2018-01-28 DIAGNOSIS — E119 Type 2 diabetes mellitus without complications: Secondary | ICD-10-CM | POA: Diagnosis not present

## 2018-01-28 DIAGNOSIS — Z885 Allergy status to narcotic agent status: Secondary | ICD-10-CM | POA: Insufficient documentation

## 2018-01-28 DIAGNOSIS — Z1509 Genetic susceptibility to other malignant neoplasm: Secondary | ICD-10-CM

## 2018-01-28 DIAGNOSIS — Z4001 Encounter for prophylactic removal of breast: Secondary | ICD-10-CM | POA: Diagnosis not present

## 2018-01-28 DIAGNOSIS — K219 Gastro-esophageal reflux disease without esophagitis: Secondary | ICD-10-CM | POA: Diagnosis not present

## 2018-01-28 HISTORY — PX: TOTAL MASTECTOMY: SHX6129

## 2018-01-28 HISTORY — PX: BREAST RECONSTRUCTION WITH PLACEMENT OF TISSUE EXPANDER AND ALLODERM: SHX6805

## 2018-01-28 LAB — GLUCOSE, CAPILLARY
GLUCOSE-CAPILLARY: 93 mg/dL (ref 70–99)
Glucose-Capillary: 72 mg/dL (ref 70–99)

## 2018-01-28 SURGERY — MASTECTOMY, SIMPLE
Anesthesia: General | Site: Breast | Laterality: Bilateral

## 2018-01-28 MED ORDER — FENTANYL CITRATE (PF) 100 MCG/2ML IJ SOLN: 25.0000 ug | INTRAMUSCULAR | Status: DC | PRN

## 2018-01-28 MED ORDER — HYDROMORPHONE HCL 2 MG PO TABS: 2.0000 mg | ORAL_TABLET | ORAL | Status: DC | PRN

## 2018-01-28 MED ORDER — DIPHENHYDRAMINE HCL 50 MG/ML IJ SOLN: 25.0000 mg | Freq: Four times a day (QID) | INTRAMUSCULAR | Status: DC | PRN

## 2018-01-28 MED ORDER — METHOCARBAMOL 500 MG PO TABS
500.0000 mg | ORAL_TABLET | Freq: Three times a day (TID) | ORAL | Status: DC | PRN
  Administered 2018-01-29: 500 mg via ORAL
  Filled 2018-01-28: qty 1

## 2018-01-28 MED ORDER — LIDOCAINE 2% (20 MG/ML) 5 ML SYRINGE
INTRAMUSCULAR | Status: DC | PRN
  Administered 2018-01-28: 100 mg via INTRAVENOUS

## 2018-01-28 MED ORDER — SUGAMMADEX SODIUM 200 MG/2ML IV SOLN
INTRAVENOUS | Status: DC | PRN
  Administered 2018-01-28: 150 mg via INTRAVENOUS

## 2018-01-28 MED ORDER — ACETAMINOPHEN 10 MG/ML IV SOLN: 1000.0000 mg | Freq: Once | INTRAVENOUS | Status: DC | PRN

## 2018-01-28 MED ORDER — ROCURONIUM BROMIDE 10 MG/ML (PF) SYRINGE
PREFILLED_SYRINGE | INTRAVENOUS | Status: DC | PRN
  Administered 2018-01-28: 50 mg via INTRAVENOUS

## 2018-01-28 MED ORDER — PROMETHAZINE HCL 25 MG/ML IJ SOLN
6.2500 mg | Freq: Four times a day (QID) | INTRAMUSCULAR | Status: DC | PRN
  Administered 2018-01-28 – 2018-01-29 (×3): 6.25 mg via INTRAVENOUS
  Filled 2018-01-28 (×3): qty 1

## 2018-01-28 MED ORDER — MIDAZOLAM HCL 5 MG/5ML IJ SOLN
INTRAMUSCULAR | Status: DC | PRN
  Administered 2018-01-28: 2 mg via INTRAVENOUS

## 2018-01-28 MED ORDER — 0.9 % SODIUM CHLORIDE (POUR BTL) OPTIME
TOPICAL | Status: DC | PRN
  Administered 2018-01-28 (×3): 1000 mL

## 2018-01-28 MED ORDER — GABAPENTIN 300 MG PO CAPS
300.0000 mg | ORAL_CAPSULE | ORAL | Status: AC
  Administered 2018-01-28: 300 mg via ORAL
  Filled 2018-01-28: qty 1

## 2018-01-28 MED ORDER — ACETAMINOPHEN 500 MG PO TABS: 1000.0000 mg | ORAL_TABLET | Freq: Four times a day (QID) | ORAL | Status: DC | PRN

## 2018-01-28 MED ORDER — FENTANYL CITRATE (PF) 100 MCG/2ML IJ SOLN
INTRAMUSCULAR | Status: AC
  Administered 2018-01-28: 100 ug
  Filled 2018-01-28: qty 2

## 2018-01-28 MED ORDER — HYDROMORPHONE HCL 1 MG/ML IJ SOLN
0.5000 mg | INTRAMUSCULAR | Status: DC | PRN
  Administered 2018-01-28 – 2018-01-29 (×4): 1 mg via INTRAVENOUS
  Filled 2018-01-28 (×4): qty 1

## 2018-01-28 MED ORDER — MIDAZOLAM HCL 2 MG/2ML IJ SOLN: 2.0000 mg | Freq: Once | INTRAMUSCULAR | Status: DC

## 2018-01-28 MED ORDER — PHENYLEPHRINE 40 MCG/ML (10ML) SYRINGE FOR IV PUSH (FOR BLOOD PRESSURE SUPPORT)
PREFILLED_SYRINGE | INTRAVENOUS | Status: DC | PRN
  Administered 2018-01-28 (×5): 80 ug via INTRAVENOUS

## 2018-01-28 MED ORDER — DEXAMETHASONE SODIUM PHOSPHATE 10 MG/ML IJ SOLN
INTRAMUSCULAR | Status: AC
  Filled 2018-01-28: qty 1

## 2018-01-28 MED ORDER — SCOPOLAMINE 1 MG/3DAYS TD PT72
MEDICATED_PATCH | TRANSDERMAL | Status: DC | PRN
  Administered 2018-01-28: 1 via TRANSDERMAL

## 2018-01-28 MED ORDER — ACETAMINOPHEN 500 MG PO TABS
1000.0000 mg | ORAL_TABLET | ORAL | Status: AC
  Administered 2018-01-28: 1000 mg via ORAL
  Filled 2018-01-28: qty 2

## 2018-01-28 MED ORDER — SODIUM CHLORIDE 0.9 % IV SOLN
INTRAVENOUS | Status: DC | PRN
  Administered 2018-01-28 (×2): 500 mL

## 2018-01-28 MED ORDER — BUPROPION HCL ER (XL) 150 MG PO TB24
150.0000 mg | ORAL_TABLET | Freq: Every day | ORAL | Status: DC
  Administered 2018-01-29: 150 mg via ORAL
  Filled 2018-01-28: qty 1

## 2018-01-28 MED ORDER — PLECANATIDE 3 MG PO TABS: 3.0000 mg | ORAL_TABLET | Freq: Every day | ORAL | Status: DC

## 2018-01-28 MED ORDER — ENOXAPARIN SODIUM 40 MG/0.4ML ~~LOC~~ SOLN
40.0000 mg | SUBCUTANEOUS | Status: DC
  Administered 2018-01-29: 40 mg via SUBCUTANEOUS
  Filled 2018-01-28: qty 0.4

## 2018-01-28 MED ORDER — CIPROFLOXACIN IN D5W 400 MG/200ML IV SOLN
400.0000 mg | Freq: Two times a day (BID) | INTRAVENOUS | Status: AC
  Administered 2018-01-28 – 2018-01-29 (×2): 400 mg via INTRAVENOUS
  Filled 2018-01-28 (×2): qty 200

## 2018-01-28 MED ORDER — LACTATED RINGERS IV SOLN
INTRAVENOUS | Status: DC
  Administered 2018-01-28 (×2): via INTRAVENOUS

## 2018-01-28 MED ORDER — CHLORHEXIDINE GLUCONATE CLOTH 2 % EX PADS: 6.0000 | MEDICATED_PAD | Freq: Once | CUTANEOUS | Status: DC

## 2018-01-28 MED ORDER — CIPROFLOXACIN IN D5W 400 MG/200ML IV SOLN
400.0000 mg | INTRAVENOUS | Status: AC
  Administered 2018-01-28: 400 mg via INTRAVENOUS
  Filled 2018-01-28: qty 200

## 2018-01-28 MED ORDER — HYDROMORPHONE HCL 1 MG/ML IJ SOLN
INTRAMUSCULAR | Status: AC
  Filled 2018-01-28: qty 1

## 2018-01-28 MED ORDER — ROCURONIUM BROMIDE 50 MG/5ML IV SOSY
PREFILLED_SYRINGE | INTRAVENOUS | Status: AC
  Filled 2018-01-28: qty 5

## 2018-01-28 MED ORDER — FENTANYL CITRATE (PF) 250 MCG/5ML IJ SOLN
INTRAMUSCULAR | Status: AC
  Filled 2018-01-28: qty 5

## 2018-01-28 MED ORDER — HYDROMORPHONE HCL 2 MG PO TABS
2.0000 mg | ORAL_TABLET | ORAL | Status: DC | PRN
  Administered 2018-01-29: 2 mg via ORAL
  Filled 2018-01-28 (×2): qty 1

## 2018-01-28 MED ORDER — DIPHENHYDRAMINE HCL 50 MG/ML IJ SOLN
INTRAMUSCULAR | Status: DC | PRN
  Administered 2018-01-28: 25 mg via INTRAVENOUS

## 2018-01-28 MED ORDER — PROPOFOL 10 MG/ML IV BOLUS
INTRAVENOUS | Status: AC
  Filled 2018-01-28: qty 20

## 2018-01-28 MED ORDER — SODIUM CHLORIDE 0.9 % IV SOLN
INTRAVENOUS | Status: AC
  Filled 2018-01-28 (×2): qty 500000

## 2018-01-28 MED ORDER — GABAPENTIN 300 MG PO CAPS
300.0000 mg | ORAL_CAPSULE | Freq: Two times a day (BID) | ORAL | Status: DC
  Administered 2018-01-28 – 2018-01-29 (×2): 300 mg via ORAL
  Filled 2018-01-28 (×2): qty 1

## 2018-01-28 MED ORDER — MIDAZOLAM HCL 2 MG/2ML IJ SOLN
INTRAMUSCULAR | Status: AC
  Filled 2018-01-28: qty 2

## 2018-01-28 MED ORDER — FENTANYL CITRATE (PF) 100 MCG/2ML IJ SOLN: 100.0000 ug | Freq: Once | INTRAMUSCULAR | Status: DC

## 2018-01-28 MED ORDER — LIDOCAINE 2% (20 MG/ML) 5 ML SYRINGE
INTRAMUSCULAR | Status: AC
  Filled 2018-01-28: qty 10

## 2018-01-28 MED ORDER — PHENYLEPHRINE 40 MCG/ML (10ML) SYRINGE FOR IV PUSH (FOR BLOOD PRESSURE SUPPORT)
PREFILLED_SYRINGE | INTRAVENOUS | Status: AC
  Filled 2018-01-28: qty 10

## 2018-01-28 MED ORDER — DIPHENHYDRAMINE HCL 25 MG PO CAPS: 25.0000 mg | ORAL_CAPSULE | Freq: Four times a day (QID) | ORAL | Status: DC | PRN

## 2018-01-28 MED ORDER — NALOXONE HCL 0.4 MG/ML IJ SOLN
INTRAMUSCULAR | Status: AC
  Filled 2018-01-28: qty 1

## 2018-01-28 MED ORDER — BUPIVACAINE HCL (PF) 0.25 % IJ SOLN
INTRAMUSCULAR | Status: DC | PRN
  Administered 2018-01-28 (×4): 5 mL
  Administered 2018-01-28: 5 mL via EPIDURAL
  Administered 2018-01-28 (×15): 5 mL

## 2018-01-28 MED ORDER — PROPOFOL 10 MG/ML IV BOLUS
INTRAVENOUS | Status: DC | PRN
  Administered 2018-01-28: 140 mg via INTRAVENOUS

## 2018-01-28 MED ORDER — FENTANYL CITRATE (PF) 100 MCG/2ML IJ SOLN
INTRAMUSCULAR | Status: AC
  Filled 2018-01-28: qty 2

## 2018-01-28 MED ORDER — VITAMIN B-12 100 MCG PO TABS
100.0000 ug | ORAL_TABLET | Freq: Every day | ORAL | Status: DC
  Administered 2018-01-29: 100 ug via ORAL
  Filled 2018-01-28: qty 1

## 2018-01-28 MED ORDER — FENTANYL CITRATE (PF) 100 MCG/2ML IJ SOLN
INTRAMUSCULAR | Status: AC
  Administered 2018-01-28: 50 ug via INTRAVENOUS
  Filled 2018-01-28: qty 2

## 2018-01-28 MED ORDER — FENTANYL CITRATE (PF) 250 MCG/5ML IJ SOLN
INTRAMUSCULAR | Status: DC | PRN
  Administered 2018-01-28: 50 ug via INTRAVENOUS

## 2018-01-28 MED ORDER — PROMETHAZINE HCL 25 MG PO TABS
12.5000 mg | ORAL_TABLET | Freq: Four times a day (QID) | ORAL | Status: DC | PRN
  Administered 2018-01-29: 12.5 mg via ORAL
  Filled 2018-01-28: qty 1

## 2018-01-28 MED ORDER — KCL IN DEXTROSE-NACL 20-5-0.45 MEQ/L-%-% IV SOLN
INTRAVENOUS | Status: DC
  Administered 2018-01-28 – 2018-01-29 (×2): via INTRAVENOUS
  Filled 2018-01-28 (×2): qty 1000

## 2018-01-28 MED ORDER — FENTANYL CITRATE (PF) 100 MCG/2ML IJ SOLN
25.0000 ug | INTRAMUSCULAR | Status: DC | PRN
  Administered 2018-01-28 (×3): 50 ug via INTRAVENOUS

## 2018-01-28 MED ORDER — MIDAZOLAM HCL 2 MG/2ML IJ SOLN
INTRAMUSCULAR | Status: AC
  Administered 2018-01-28: 2 mg
  Filled 2018-01-28: qty 2

## 2018-01-28 SURGICAL SUPPLY — 71 items
ALLOGRAFT PERF 16X20 1.6+/-0.4 (Tissue) ×2 IMPLANT
BAG DECANTER FOR FLEXI CONT (MISCELLANEOUS) ×3 IMPLANT
BINDER BREAST XLRG (GAUZE/BANDAGES/DRESSINGS) ×1 IMPLANT
BLADE SURG 10 STRL SS (BLADE) ×1 IMPLANT
BLADE SURG 15 STRL LF DISP TIS (BLADE) IMPLANT
BLADE SURG 15 STRL SS (BLADE) ×1
CANISTER SUCT 3000ML PPV (MISCELLANEOUS) ×4 IMPLANT
CHLORAPREP W/TINT 26ML (MISCELLANEOUS) ×4 IMPLANT
CLIP VESOCCLUDE MED 6/CT (CLIP) ×2 IMPLANT
COVER SURGICAL LIGHT HANDLE (MISCELLANEOUS) ×2 IMPLANT
DERMABOND ADVANCED (GAUZE/BANDAGES/DRESSINGS) ×1
DERMABOND ADVANCED .7 DNX12 (GAUZE/BANDAGES/DRESSINGS) IMPLANT
DEVICE DISSECT PLASMABLAD 3.0S (MISCELLANEOUS) ×1 IMPLANT
DRAIN CHANNEL 15F RND FF W/TCR (WOUND CARE) ×2 IMPLANT
DRAIN CHANNEL 19F RND (DRAIN) ×2 IMPLANT
DRAPE HALF SHEET 40X57 (DRAPES) ×2 IMPLANT
DRAPE ORTHO SPLIT 77X108 STRL (DRAPES) ×2
DRAPE SURG 17X23 STRL (DRAPES) ×8 IMPLANT
DRAPE SURG ORHT 6 SPLT 77X108 (DRAPES) ×2 IMPLANT
DRAPE WARM FLUID 44X44 (DRAPE) ×2 IMPLANT
DRSG TEGADERM 4X4.75 (GAUZE/BANDAGES/DRESSINGS) ×1 IMPLANT
ELECT BLADE 4.0 EZ CLEAN MEGAD (MISCELLANEOUS) ×2
ELECT REM PT RETURN 9FT ADLT (ELECTROSURGICAL) ×4
ELECTRODE BLDE 4.0 EZ CLN MEGD (MISCELLANEOUS) ×1 IMPLANT
ELECTRODE REM PT RTRN 9FT ADLT (ELECTROSURGICAL) ×1 IMPLANT
EVACUATOR SILICONE 100CC (DRAIN) ×4 IMPLANT
EXPANDER TISSUE FV FOURTE 400 (Prosthesis & Implant Plastic) IMPLANT
FILTER IN LINE W/DETACHED HOSE (FILTER) ×2 IMPLANT
GLOVE BIO SURGEON STRL SZ 6 (GLOVE) ×7 IMPLANT
GLOVE BIOGEL PI IND STRL 6.5 (GLOVE) IMPLANT
GLOVE BIOGEL PI IND STRL 7.0 (GLOVE) IMPLANT
GLOVE BIOGEL PI IND STRL 7.5 (GLOVE) IMPLANT
GLOVE BIOGEL PI IND STRL 8 (GLOVE) ×1 IMPLANT
GLOVE BIOGEL PI INDICATOR 6.5 (GLOVE) ×1
GLOVE BIOGEL PI INDICATOR 7.0 (GLOVE) ×2
GLOVE BIOGEL PI INDICATOR 7.5 (GLOVE) ×1
GLOVE BIOGEL PI INDICATOR 8 (GLOVE) ×1
GLOVE ECLIPSE 7.5 STRL STRAW (GLOVE) ×4 IMPLANT
GLOVE SURG SS PI 6.0 STRL IVOR (GLOVE) ×1 IMPLANT
GOWN STRL REUS W/ TWL LRG LVL3 (GOWN DISPOSABLE) ×3 IMPLANT
GOWN STRL REUS W/ TWL XL LVL3 (GOWN DISPOSABLE) ×1 IMPLANT
GOWN STRL REUS W/TWL LRG LVL3 (GOWN DISPOSABLE) ×3
GOWN STRL REUS W/TWL XL LVL3 (GOWN DISPOSABLE) ×1
KIT BASIN OR (CUSTOM PROCEDURE TRAY) ×4 IMPLANT
KIT TURNOVER KIT B (KITS) ×4 IMPLANT
MARKER SKIN DUAL TIP RULER LAB (MISCELLANEOUS) ×3 IMPLANT
NS IRRIG 1000ML POUR BTL (IV SOLUTION) ×6 IMPLANT
PACK GENERAL/GYN (CUSTOM PROCEDURE TRAY) ×2 IMPLANT
PAD ABD 8X10 STRL (GAUZE/BANDAGES/DRESSINGS) ×1 IMPLANT
PAD ARMBOARD 7.5X6 YLW CONV (MISCELLANEOUS) ×4 IMPLANT
PLASMABLADE 3.0S (MISCELLANEOUS) ×2
SET ASEPTIC TRANSFER (MISCELLANEOUS) ×2 IMPLANT
SOL PREP POV-IOD 4OZ 10% (MISCELLANEOUS) ×2 IMPLANT
SPECIMEN JAR X LARGE (MISCELLANEOUS) ×3 IMPLANT
SPONGE LAP 18X18 RF (DISPOSABLE) ×3 IMPLANT
STAPLER VISISTAT 35W (STAPLE) ×2 IMPLANT
SUT CHROMIC 4 0 PS 2 18 (SUTURE) ×6 IMPLANT
SUT ETHILON 2 0 FS 18 (SUTURE) ×3 IMPLANT
SUT MNCRL AB 4-0 PS2 18 (SUTURE) ×2 IMPLANT
SUT SILK 2 0 PERMA HAND 18 BK (SUTURE) ×1 IMPLANT
SUT VIC AB 0 CT2 27 (SUTURE) ×4 IMPLANT
SUT VIC AB 2-0 BRD 54 (SUTURE) ×1 IMPLANT
SUT VIC AB 3-0 SH 18 (SUTURE) ×1 IMPLANT
SUT VIC AB 3-0 SH 27 (SUTURE) ×2
SUT VIC AB 3-0 SH 27X BRD (SUTURE) IMPLANT
SUT VIC AB 4-0 PS2 18 (SUTURE) ×4 IMPLANT
SUT VLOC 180 0 24IN GS25 (SUTURE) ×2 IMPLANT
SYR BULB IRRIGATION 50ML (SYRINGE) ×2 IMPLANT
TISSUE EXPNDR FV FOURTE 400 (Prosthesis & Implant Plastic) ×4 IMPLANT
TOWEL GREEN STERILE (TOWEL DISPOSABLE) ×1 IMPLANT
TUBE CONNECTING 12X1/4 (SUCTIONS) ×4 IMPLANT

## 2018-01-28 NOTE — Transfer of Care (Signed)
Immediate Anesthesia Transfer of Care Note  Patient: [name]  Procedure(s) Performed: BILATERAL TOTAL MASTECTOMY (Bilateral Breast) BILATERAL BREAST RECONSTRUCTION WITH PLACEMENT OF TISSUE EXPANDER AND ALLODERM (Bilateral Breast)  Patient Location: PACU  Anesthesia Type:General  Level of Consciousness: drowsy  Airway & Oxygen Therapy: Patient Spontanous Breathing and Patient connected to nasal cannula oxygen  Post-op Assessment: Report given to RN and Post -op Vital signs reviewed and stable  Post vital signs: Reviewed and stable  Last Vitals:  Vitals Value Taken Time  BP 102/57 01/28/2018  1:18 PM  Temp 36.1 C 01/28/2018  1:18 PM  Pulse 85 01/28/2018  1:21 PM  Resp 16 01/28/2018  1:21 PM  SpO2 100 % 01/28/2018  1:21 PM  Vitals shown include unvalidated device data.  Last Pain:  Vitals:   01/28/18 0809  TempSrc:   PainSc: 0-No pain         Complications: No apparent anesthesia complications

## 2018-01-28 NOTE — Anesthesia Procedure Notes (Signed)
Anesthesia Regional Block: Pectoralis block   Pre-Anesthetic Checklist: ,, timeout performed, Correct Patient, Correct Site, Correct Laterality, Correct Procedure, Correct Position, site marked, Risks and benefits discussed,  Surgical consent,  Pre-op evaluation,  At surgeon's request and post-op pain management  Laterality: Left and N/A  Prep: chloraprep       Needles:  Injection technique: Single-shot  Needle Type: Echogenic Stimulator Needle     Needle Length: 10cm  Needle Gauge: 21   Needle insertion depth: 2 cm   Additional Needles:   Procedures:,,,, ultrasound used (permanent image in chart),,,,  Narrative:  Start time: 01/28/2018 9:10 AM End time: 01/28/2018 9:20 AM Injection made incrementally with aspirations every 5 mL.  Performed by: Personally  Anesthesiologist: Lyn Hollingshead, MD

## 2018-01-28 NOTE — Anesthesia Postprocedure Evaluation (Signed)
Anesthesia Post Note  Patient: [name]  Procedure(s) Performed: BILATERAL TOTAL MASTECTOMY (Bilateral Breast) BILATERAL BREAST RECONSTRUCTION WITH PLACEMENT OF TISSUE EXPANDER AND ALLODERM (Bilateral Breast)     Patient location during evaluation: PACU Anesthesia Type: General Level of consciousness: awake Pain management: pain level controlled Vital Signs Assessment: post-procedure vital signs reviewed and stable Respiratory status: spontaneous breathing Cardiovascular status: stable Postop Assessment: no apparent nausea or vomiting Anesthetic complications: no    Last Vitals:  Vitals:   01/28/18 1333 01/28/18 1348  BP: 106/64 110/76  Pulse: 84 79  Resp: 15 19  Temp:    SpO2: 100% 100%    Last Pain:  Vitals:   01/28/18 1353  TempSrc:   PainSc: Asleep                 Lovelee Forner

## 2018-01-28 NOTE — Anesthesia Procedure Notes (Signed)
Anesthesia Regional Block: Pectoralis block   Pre-Anesthetic Checklist: ,, timeout performed, Correct Patient, Correct Site, Correct Laterality, Correct Procedure, Correct Position, site marked, Risks and benefits discussed,  Surgical consent,  Pre-op evaluation,  At surgeon's request and post-op pain management  Laterality: Right and N/A  Prep: chloraprep       Needles:  Injection technique: Single-shot  Needle Type: Echogenic Stimulator Needle     Needle Length: 10cm  Needle Gauge: 22   Needle insertion depth: 2 cm   Additional Needles:   Procedures:,,,, ultrasound used (permanent image in chart),,,,  Narrative:  Start time: 01/28/2018 9:30 AM End time: 01/28/2018 9:40 AM Injection made incrementally with aspirations every 5 mL.  Performed by: Personally  Anesthesiologist: Lyn Hollingshead, MD

## 2018-01-28 NOTE — Op Note (Signed)
Operative Note   DATE OF OPERATION: 11.8.19  LOCATION: [facility]-observation  SURGICAL DIVISION: Plastic Surgery  PREOPERATIVE DIAGNOSES:  1. BRCA1 genetic carrier  POSTOPERATIVE DIAGNOSES:  same  PROCEDURE:  1. Bilateral breast reconstruction with tissue expanders 2. Acellular dermis (Alloderm) for breast reconstruction 600 cm2   SURGEON: Irene Limbo MD MBA  ASSISTANTCaryl Asp RNFA  ANESTHESIA:  General.   EBL: 50 ml for entire procedure  COMPLICATIONS: None immediate.   INDICATIONS FOR PROCEDURE:  The patient, [name], is a 42 y.o. female born on 1975-11-30, is here for bilateral immediate prepectoral expander acellular dermis reconstruction following skin reduction pattern prophylactic mastectomies.   FINDINGS: Natrelle 133S FV-12-T 400 ml tissue expanders placed bilateral. Initial fill volume 300 ml air. LEFT SN [id number] RIGHT SN [id number]  DESCRIPTION OF PROCEDURE:  The patient was marked with the patient in the preoperative area to mark sternal notch, chest midline, anterior axillary lines and inframammary folds. Patient was marked for skin reduction mastectomy with most superior portion nipple areola marked on breast meridian. Vertical limbs marked by breast displacement and set at9cm length. The patient was taken to the operating room. SCDs were placed and IV antibiotics were given. Foley catheter placed. The patient's operative site was prepped and draped in a sterile fashion. A time out was performed and all information was confirmed to be correct. In supine position, the lateral limbs for resection marked and area over lower pole preserved as inferiorly based dermal pedicle. Skin de epithelialized in this area.Following completion of mastectomies, reconstruction began onleftside.  The cavity was irrigated with solution containingpolymyxinbacitracin. Hemostasis was ensured. A 19 Fr drain was placed in subcutaneous position laterally anda 15 Fr drain  placed along inframammary fold. Eachsecured to skin with 2-0 nylon. Cavity irrigated with Betadine.The tissue expanderswere prepared on back table prior in insertion. The expander was filled with air to31m.Perforated acellular dermis was draped over anterior surface expander. The ADM was then secured to itself over posterior surface of expander with 4-0 chromic. Redundant folds acellular dermis excised so that the ADM lay flat without folds over air filled expander.The expander was secured to medial insertion pectoralis with a 0 vicryl.The superior and lateral tabs also secured to pectoralis muscle with 0-vicryl. The ADM was secured to pectoralis muscle and chest wall along inferior border at inframammary fold.Laterally the mastectomy flap over posterior axillary line was advanced anteriorly and the subcutaneous tissue and superficial fascia was secured to pectoralis muscle and acellular dermis with 0-vicryl. The inferiorly based dermal pedicle was redraped superiorly over expander and acellular dermis and secured to pectoralis with interrupted 0-vicryl. Skin closure completedwith 3-0 vicryl in fascial layer and 4-0 vicryl in dermis. Skin closure completed with 4-0 monocryl subcuticular and tissue adhesive.  I then directed my attention torightchest where similar irrigation and drain placement completed. The prepared expander with ADM secured over anterior surface was placed in right chest and tabs secured to chest wall and pectoralis muscle with 0- vicryl suture. The acellular dermis at inframammary fold was secured to chest wall with 0 V-lock suture.Laterally the mastectomy flap over posterior axillary line was advanced anteriorly and the subcutaneous tissue and superficial fascia was secured to pectoralis muscle and acellular dermis with 0-vicryl. The inferiorly based dermal pedicle was redraped superiorly over expander and acellular dermis and secured to pectoralis with interrupted 0-vicryl.  Skin closure completedwith 3-0 vicryl in fascial layer and 4-0 vicryl in dermis. Skin closure completed with 4-0 monocryl subcuticular and tissue adhesive.Tegaderms applied bilateral,  followed by dry dressing and breast binder.  The patient was allowed to wake from anesthesia, extubated and taken to the recovery room in satisfactory condition.   SPECIMENS: none  DRAINS: 15 and 19 Fr JP in right and left breast reconstruction  Irene Limbo, MD [facility] [phone], pin [phone]

## 2018-01-28 NOTE — Op Note (Signed)
Preoperative Diagnosis: BRCA POSITIVE  Postoprative Diagnosis: BRCA POSITIVE  Procedure: Procedure(s): BILATERAL TOTAL MASTECTOMY    Surgeon: Excell Seltzer T   Assistants: RNFA  Anesthesia:  General endotracheal anesthesia  Indications: Patient is a 42 year old female recently found to be BRCA1 positive.  After extensive discussion regarding alternatives and benefits and risks detailed elsewhere she has elected to proceed with bilateral total mastectomy and immediate reconstruction.    Procedure Detail: Patient was brought to the operating room, placed in supine position on the operating table, and general endotracheal anesthesia induced.  She was carefully positioned with arms extended.  The entire anterior chest neck and upper arms were widely sterilely prepped and draped.  She received preoperative IV antibiotics.  Patient timeout was performed and correct procedure verified.  A wise pattern reduction incision was used drawn by Dr. Iran Planas.  The left side was approached initially.  After incision skin and subcutaneous flaps were developed with the plasma blade superiorly toward the clavicle, medially to the edge of the sternum, inferiorly to the inframammary crease and laterally out to the anterior border of the latissimus which was defined.  The dissection was deepened down to the chest wall.  The breast was then reflected medial to lateral off attachments to the pectoralis and serratus.  The specimen was dissected off the lateral border of the pectoralis and inferior laterally off the serratus down to the anterior border of the latissimus and anteriorly off of this working up toward the axilla.  The clavipectoral fascia was incised and axillary contents identified and at the junction of the tail of Spence and axillary contents the specimen was divided between Cleone clamps and removed.  This was tied with 3-0 Vicryl.  The specimen was oriented and sent for permanent pathology.  Wound was  irrigated and complete hemostasis assured.  Following this the right side was approached with an identical incision and dissection.  Following completion of the procedure Dr. Iran Planas then proceeded with reconstruction.    Findings: As above  Estimated Blood Loss:  Minimal         Drains: Per Dr. Iran Planas  Blood Given: none          Specimens: Bilateral total mastectomy        Complications:  * No complications entered in OR log *         Disposition: To [place] proceeded with reconstruction         Condition: stable

## 2018-01-28 NOTE — Interval H&P Note (Signed)
History and Physical Interval Note:  01/28/2018 8:47 AM  [name]  has presented today for surgery, with the diagnosis of BRCA POSITIVE  The various methods of treatment have been discussed with the patient and family. After consideration of risks, benefits and other options for treatment, the patient has consented to  Procedure(s): BILATERAL TOTAL MASTECTOMY (Bilateral) BILATERAL BREAST RECONSTRUCTION WITH PLACEMENT OF TISSUE EXPANDER AND ALLODERM (Bilateral) as a surgical intervention .  The patient's history has been reviewed, patient examined, no change in status, stable for surgery.  I have reviewed the patient's chart and labs.  Questions were answered to the patient's satisfaction.     Darene Lamer Kammie Scioli

## 2018-01-28 NOTE — Interval H&P Note (Signed)
History and Physical Interval Note:  01/28/2018 8:41 AM  [name]  has presented today for surgery, with the diagnosis of BRCA POSITIVE  The various methods of treatment have been discussed with the patient and family. After consideration of risks, benefits and other options for treatment, the patient has consented to  Procedure(s): BILATERAL TOTAL MASTECTOMY (Bilateral) BILATERAL BREAST RECONSTRUCTION WITH PLACEMENT OF TISSUE EXPANDER AND ALLODERM (Bilateral) as a surgical intervention .  The patient's history has been reviewed, patient examined, no change in status, stable for surgery.  I have reviewed the patient's chart and labs.  Questions were answered to the patient's satisfaction.     Arnoldo Hooker Deran Barro

## 2018-01-28 NOTE — Anesthesia Preprocedure Evaluation (Signed)
Anesthesia Evaluation  Patient identified by MRN, date of birth, ID band Patient awake    Reviewed: Allergy & Precautions, NPO status , Patient's Chart, lab work & pertinent test results  History of Anesthesia Complications Negative for: history of anesthetic complications  Airway Mallampati: I  TM Distance: >3 FB Neck ROM: Full    Dental no notable dental hx. (+) Dental Advisory Given, Teeth Intact   Pulmonary sleep apnea and Continuous Positive Airway Pressure Ventilation ,    Pulmonary exam normal        Cardiovascular negative cardio ROS Normal cardiovascular exam     Neuro/Psych Seizures -, Well Controlled,  PSYCHIATRIC DISORDERS Anxiety Depression negative neurological ROS     GI/Hepatic Neg liver ROS,   Endo/Other  diabetes  Renal/GU negative Renal ROS     Musculoskeletal   Abdominal Normal abdominal exam  (+)   Peds  Hematology negative hematology ROS (+)   Anesthesia Other Findings Day of surgery medications reviewed with the patient.  Reproductive/Obstetrics                             Anesthesia Physical  Anesthesia Plan  ASA: II  Anesthesia Plan: General   Post-op Pain Management:  Regional for Post-op pain   Induction: Intravenous  PONV Risk Score and Plan: 3 and Ondansetron, Scopolamine patch - Pre-op and Diphenhydramine  Airway Management Planned: Oral ETT  Additional Equipment:   Intra-op Plan:   Post-operative Plan: Extubation in OR  Informed Consent: I have reviewed the patients History and Physical, chart, labs and discussed the procedure including the risks, benefits and alternatives for the proposed anesthesia with the patient or authorized representative who has indicated his/her understanding and acceptance.   Dental advisory given  Plan Discussed with: CRNA  Anesthesia Plan Comments:         Anesthesia Quick Evaluation

## 2018-01-28 NOTE — Anesthesia Procedure Notes (Signed)
Procedure Name: Intubation Date/Time: 01/28/2018 9:57 AM Performed by: Imagene Riches, CRNA Pre-anesthesia Checklist: Patient identified, Emergency Drugs available, Suction available and Patient being monitored Patient Re-evaluated:Patient Re-evaluated prior to induction Oxygen Delivery Method: Circle System Utilized Preoxygenation: Pre-oxygenation with 100% oxygen Induction Type: IV induction Ventilation: Mask ventilation without difficulty Laryngoscope Size: Miller and 3 Grade View: Grade I Tube type: Oral Tube size: 7.0 mm Number of attempts: 1 Airway Equipment and Method: Stylet and Oral airway Placement Confirmation: ETT inserted through vocal cords under direct vision,  positive ETCO2 and breath sounds checked- equal and bilateral Secured at: 22 cm Tube secured with: Tape Dental Injury: Teeth and Oropharynx as per pre-operative assessment

## 2018-01-29 DIAGNOSIS — Z4001 Encounter for prophylactic removal of breast: Secondary | ICD-10-CM | POA: Diagnosis not present

## 2018-01-29 MED ORDER — CIPROFLOXACIN HCL 500 MG PO TABS: 500.0000 mg | ORAL_TABLET | Freq: Two times a day (BID) | ORAL | 0 refills | Status: AC

## 2018-01-29 MED ORDER — MECLIZINE HCL 25 MG PO TABS
25.0000 mg | ORAL_TABLET | Freq: Three times a day (TID) | ORAL | Status: DC | PRN
  Filled 2018-01-29: qty 1

## 2018-01-29 MED ORDER — HYDROMORPHONE HCL 2 MG PO TABS: 2.0000 mg | ORAL_TABLET | ORAL | 0 refills | Status: DC | PRN

## 2018-01-29 MED ORDER — METHOCARBAMOL 500 MG PO TABS: 500.0000 mg | ORAL_TABLET | Freq: Three times a day (TID) | ORAL | 0 refills | Status: DC | PRN

## 2018-01-29 MED ORDER — GABAPENTIN 300 MG PO CAPS: 300.0000 mg | ORAL_CAPSULE | Freq: Two times a day (BID) | ORAL | 0 refills | Status: DC

## 2018-01-29 MED ORDER — METHOCARBAMOL 500 MG PO TABS
1000.0000 mg | ORAL_TABLET | Freq: Three times a day (TID) | ORAL | Status: DC | PRN
  Administered 2018-01-29: 1000 mg via ORAL
  Filled 2018-01-29: qty 2

## 2018-01-29 MED ORDER — PROMETHAZINE HCL 12.5 MG PO TABS: 12.5000 mg | ORAL_TABLET | Freq: Four times a day (QID) | ORAL | 0 refills | Status: DC | PRN

## 2018-01-29 NOTE — Discharge Summary (Signed)
Physician Discharge Summary  Patient ID: [name] MRN: [id number] DOB/AGE: 1975-11-17 42 y.o.  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 She had nausea without emesis controlled with anti nausea medications. She was ambulatory with minimal assist.   Treatments: surgery: bilateral mastectomies with tissue expander, acellular dermis reconstruction 11.9.19  Discharge Exam: Blood pressure 108/62, pulse 82, temperature 98.4 F (36.9 C), temperature source Oral, resp. rate 17, height _0  (1.676 m), weight 81.3 kg, last menstrual period 03/07/2012, SpO2 99 %. Incision/Wound: chest soft with Tegaderms in place, drains serosanguinous, incisions intact dry  Disposition: Discharge disposition: 01-Home or Self Care       Discharge Instructions    Call MD for:  redness, tenderness, or signs of infection (pain, swelling, bleeding, redness, odor or green/yellow discharge around incision site)   Complete by:  As directed    Call MD for:  temperature >100.5   Complete by:  As directed    Discharge instructions   Complete by:  As directed    Ok to remove dressings and shower am 11.10.19. Soap and water ok, pat Tegaderms dry. No creams or ointments over incisions. Do not let drains dangle in shower, attach to lanyard or similar.Strip and record drains twice daily and bring log to clinic visit.  Breast binder or soft compression bra all other times. Ok to use camisole or T shirt beneath breast binder if this is itchy.  Ok to raise arms above shoulders for bathing and dressing.  No house yard work or exercise until cleared by MD.   Madaline Brilliant to use ibuprofen as directed to aid with pain control. Recommend Miralax or Dulcolax as needed for constipation.   Driving  Restrictions   Complete by:  As directed    No driving for 2 weeks then no driving if taking narcotics   Lifting restrictions   Complete by:  As directed    No lifting > 5 lb until cleared by MD   Resume previous diet   Complete by:  As directed      Allergies as of 01/29/2018      Reactions   Ace Inhibitors Other (See Comments)   Spaces out and triggers migraines   Atarax [hydroxyzine Hcl] Anaphylaxis   Cephalosporins Anaphylaxis   Codeine Anaphylaxis, Rash   Tolerates Dilaudid and Percocet. Tolerates Dilaudid and Percocet. Tolerates Dilaudid and Percocet.   Compazine Anaphylaxis   Tolerates Phenergan.   Decadron [dexamethasone] Anaphylaxis   Demerol Anaphylaxis   Glucosamine    Other reaction(s): Other (See Comments) Religous reasons Religous reasons   Hydroxyzine Hcl Anaphylaxis   Metoclopramide Hcl Anaphylaxis   Morphine Anaphylaxis   Tolerates Dilaudid and Percocet.   Morphine And Related Anaphylaxis   Tolerates Dilaudid and Percocet.   Ondansetron Anaphylaxis   Other Anaphylaxis, Other (See Comments)   ALL STEROIDS CAUSES ULCERS; also to DERMABOND  Tolerates Promethazine   Penicillins Anaphylaxis   PATIENT HAS HAD A PCN REACTION WITH IMMEDIATE RASH, FACIAL/TONGUE/THROAT SWELLING, SOB, OR LIGHTHEADEDNESS WITH HYPOTENSION:  #  #  YES  #  #  Has patient had a PCN reaction causing severe rash involving mucus membranes or skin necrosis: unknown Has patient had a PCN reaction that required hospitalization: unknown Has patient had a PCN reaction occurring within the last 10 years: NO   Poractant  Alfa Other (See Comments)   Religous reasons--per pt ok to take Heparin when medically necessary   Pork-derived Products Other (See Comments)   Religous reasons--per pt ok to take Heparin when medically necessary   Prozac [fluoxetine Hcl] Other (See Comments)   Pt reports severe negative mood altering after taking.    Shellfish-derived Products Other (See Comments)   Religous  reasons   Stevia Glycerite Extract [flavoring Agent] Swelling   Tongue swelling   Sulfa Antibiotics Anaphylaxis   Sulfasalazine Anaphylaxis   Sumatriptan Anaphylaxis   Causes migraine   Triptans Anaphylaxis   Makes migraines worse also   Zanaflex [tizanidine Hcl] Other (See Comments)   Loss of balance, lightheadedness, spaces out   Zofran Anaphylaxis   Pravastatin Other (See Comments)   Leg cramps   Skin Adhesives [cyanoacrylate] Rash   Dermabond, skin peels   Stevioside Swelling   Dihydroergotamine Other (See Comments)   unknown   Periactin [cyproheptadine] Rash      Medication List    TAKE these medications   acetaminophen 500 MG tablet Commonly known as:  TYLENOL Take 1,000 mg by mouth every 6 (six) hours as needed for moderate pain.   BOTOX IJ Inject 1 each as directed every 3 (three) months. For migraines   buPROPion 150 MG 24 hr tablet Commonly known as:  WELLBUTRIN XL Take 150 mg by mouth daily.   CALCIUM 500 + D 500-200 MG-UNIT Tabs Generic drug:  Calcium Carb-Cholecalciferol Take 1 tablet by mouth daily.   ciprofloxacin 500 MG tablet Commonly known as:  CIPRO Take 1 tablet (500 mg total) by mouth 2 (two) times daily for 5 days.   EPINEPHrine 0.3 mg/0.3 mL Soaj injection Commonly known as:  EPI-PEN Inject 0.3 mg into the muscle once.   gabapentin 300 MG capsule Commonly known as:  NEURONTIN Take 1 capsule (300 mg total) by mouth 2 (two) times daily.   HYDROmorphone 2 MG tablet Commonly known as:  DILAUDID Take 1 tablet (2 mg total) by mouth every 4 (four) hours as needed for severe pain.   methocarbamol 500 MG tablet Commonly known as:  ROBAXIN Take 1 tablet (500 mg total) by mouth every 8 (eight) hours as needed for muscle spasms.   multivitamin with minerals Tabs tablet Take 1 tablet by mouth daily. Alive [employer] Multivitamin   promethazine 12.5 MG tablet Commonly known as:  PHENERGAN Take 1 tablet (12.5 mg total) by mouth every 6  (six) hours as needed for nausea.   TRULANCE 3 MG Tabs Generic drug:  Plecanatide Take 3 mg by mouth daily.   vitamin B-12 100 MCG tablet Commonly known as:  CYANOCOBALAMIN Take 100 mcg by mouth daily.      Follow-up Information    Irene Limbo, MD In 1 week.   Specialty:  Plastic Surgery Why:  as scheduled Contact information: [address] [place] [address] [phone]        Excell Seltzer, MD. Schedule an appointment as soon as possible for a visit in 2 weeks.   Specialty:  General Surgery Contact information: [place] [address] [phone]           Signed: Irene Limbo 01/29/2018, 10:22 AM

## 2018-01-29 NOTE — Progress Notes (Signed)
Breck Coons to be D/C'd  per MD order. Discussed with the patient and all questions fully answered.  VSS, Skin clean, dry and intact without evidence of skin break down, no evidence of skin tears noted.  IV catheter discontinued intact. Site without signs and symptoms of complications. Dressing and pressure applied.  An After Visit Summary was printed and given to the patient. Patient received prescription.  D/c education completed with patient/family including follow up instructions, medication list, d/c activities limitations if indicated, with other d/c instructions as indicated by MD - patient able to verbalize understanding, all questions fully answered.   Patient instructed to return to ED, call 911, or call MD for any changes in condition.   Patient to be escorted via [place], and D/C home via private auto.

## 2018-01-29 NOTE — Progress Notes (Signed)
1 Day Post-Op   Subjective/Chief Complaint: Very sore this AM.  Having nausea.     Objective: Vital signs in last 24 hours: Temp:  [97 F (36.1 C)-99.2 F (37.3 C)] 98.4 F (36.9 C) (11/09 0540) Pulse Rate:  [77-94] 82 (11/09 0540) Resp:  [12-19] 17 (11/09 0540) BP: (100-142)/(46-85) 108/62 (11/09 0540) SpO2:  [96 %-100 %] 99 % (11/09 0540) Weight:  [81.3 kg] 81.3 kg (11/08 1448) Last BM Date: 01/24/18  Intake/Output from previous day: 11/08 0701 - 11/09 0700 In: 3380 [P.O.:650; I.V.:2479.9; IV Piggyback:200.1] Out: 870 [Urine:380; Drains:440; Blood:50] Intake/Output this shift: No intake/output data recorded.  General appearance: alert, cooperative and moderate distress Resp: breathing comfortably Breasts: skin appears intact without evidence of ischemia or congestion.  Drain output serosang Extremities: extremities normal, atraumatic, no cyanosis or edema sacral pad in place.  Ambulatory, but slw.  Lab Results:  No results for input(s): WBC, HGB, HCT, PLT in the last 72 hours. BMET No results for input(s): NA, K, CL, CO2, GLUCOSE, BUN, CREATININE, CALCIUM in the last 72 hours. PT/INR No results for input(s): LABPROT, INR in the last 72 hours. ABG No results for input(s): PHART, HCO3 in the last 72 hours.  Invalid input(s): PCO2, PO2  Studies/Results: No results found.  Anti-infectives: Anti-infectives (From admission, onward)   Start     Dose/Rate Route Frequency Ordered Stop   01/28/18 2200  ciprofloxacin (CIPRO) IVPB 400 mg     400 mg 200 mL/hr over 60 Minutes Intravenous Every 12 hours 01/28/18 1504 01/29/18 2159   01/28/18 1136  polymyxin B 500,000 Units, bacitracin 50,000 Units in sodium chloride 0.9 % 500 mL irrigation  Status:  Discontinued       As needed 01/28/18 1137 01/28/18 1314   01/28/18 0730  ciprofloxacin (CIPRO) IVPB 400 mg     400 mg 200 mL/hr over 60 Minutes Intravenous On call to O.R. 01/28/18 0729 01/28/18 1022       Assessment/Plan: s/p Procedure(s): BILATERAL TOTAL MASTECTOMY (Bilateral) BILATERAL BREAST RECONSTRUCTION WITH PLACEMENT OF TISSUE EXPANDER AND ALLODERM (Bilateral) work on pain control and nausea.  Challenging due to many allergies.   Increase muscle relaxant. Try meclizine for nausea. Likely not ready for d/c today.   LOS: 1 day    [name] 01/29/2018

## 2018-01-29 NOTE — Progress Notes (Signed)
  Plastic Surgery POD#1 bilateral SRM, TE/ADM reconstruction  Patient sleeping. Easily arousable Pain when getting out of bed, tolerating diet.  Temp:  [97 F (36.1 C)-99.2 F (37.3 C)] 98.4 F (36.9 C) (11/09 0540) Pulse Rate:  [77-88] 82 (11/09 0540) Resp:  [14-19] 17 (11/09 0540) BP: (100-116)/(57-76) 108/62 (11/09 0540) SpO2:  [96 %-100 %] 99 % (11/09 0540) Weight:  [81.3 kg] 81.3 kg (11/08 1448)   PO 650 Drains left 90/130 Right 140/80  PE Chest soft tegaderms in place incisions dry intact Drains serosanguinous  A/P In discussion with patient and husband, would like to try home this pm. Patient with history of addiction, goal to get off Dilaudid as soon as able. Will d/c IV Dilaudid and IV phenergan. Plan multimodality pain for home inc neurontin dilaudid and she is able to tolerated ibuprofen (states Toradol not effective).  Irene Limbo, MD [facility] Plastic & Reconstructive Surgery [phone], pin [phone]

## 2018-01-31 ENCOUNTER — Encounter (HOSPITAL_COMMUNITY): Payer: Self-pay | Admitting: General Surgery

## 2018-03-07 ENCOUNTER — Telehealth: Payer: Self-pay

## 2018-03-07 NOTE — Telephone Encounter (Signed)
Patient called to cancel her appointment for Thursday 12/19, does not wish to reschedule at this time.   Scheduling message was sent.

## 2018-03-10 ENCOUNTER — Inpatient Hospital Stay: Payer: Managed Care, Other (non HMO) | Admitting: Hematology

## 2018-04-30 DIAGNOSIS — M79644 Pain in right finger(s): Secondary | ICD-10-CM | POA: Diagnosis not present

## 2018-05-10 DIAGNOSIS — M79644 Pain in right finger(s): Secondary | ICD-10-CM | POA: Diagnosis not present

## 2018-05-10 DIAGNOSIS — S60051A Contusion of right little finger without damage to nail, initial encounter: Secondary | ICD-10-CM | POA: Diagnosis not present

## 2018-05-16 DIAGNOSIS — G43709 Chronic migraine without aura, not intractable, without status migrainosus: Secondary | ICD-10-CM | POA: Diagnosis not present

## 2018-05-17 DIAGNOSIS — G43709 Chronic migraine without aura, not intractable, without status migrainosus: Secondary | ICD-10-CM | POA: Diagnosis not present

## 2018-05-23 ENCOUNTER — Other Ambulatory Visit: Payer: Self-pay

## 2018-05-23 ENCOUNTER — Encounter (HOSPITAL_BASED_OUTPATIENT_CLINIC_OR_DEPARTMENT_OTHER): Payer: Self-pay | Admitting: *Deleted

## 2018-05-24 NOTE — Progress Notes (Signed)
Ensure Pre-Surgery drink given to patient with instructions to complete by 0415 DOS.  Surgical scrub also given to patient with instructions for use.  Patient verbalized understanding of instructions. 

## 2018-05-30 NOTE — Anesthesia Preprocedure Evaluation (Addendum)
Anesthesia Evaluation  Patient identified by MRN, date of birth, ID band Patient awake    Reviewed: Allergy & Precautions, NPO status , Patient's Chart, lab work & pertinent test results  Airway Mallampati: II  TM Distance: >3 FB Neck ROM: Full    Dental no notable dental hx. (+) Teeth Intact, Dental Advisory Given   Pulmonary neg pulmonary ROS,    Pulmonary exam normal breath sounds clear to auscultation       Cardiovascular Exercise Tolerance: Good negative cardio ROS Normal cardiovascular exam Rhythm:Regular Rate:Normal     Neuro/Psych  Headaches, PSYCHIATRIC DISORDERS Anxiety Depression    GI/Hepatic negative GI ROS, Neg liver ROS,   Endo/Other    Renal/GU negative Renal ROS     Musculoskeletal  (+) Arthritis ,   Abdominal   Peds  Hematology   Anesthesia Other Findings   Reproductive/Obstetrics                           Anesthesia Physical Anesthesia Plan  ASA: II  Anesthesia Plan: General   Post-op Pain Management:    Induction: Intravenous  PONV Risk Score and Plan: 4 or greater and Ondansetron, Treatment may vary due to age or medical condition, Midazolam and Scopolamine patch - Pre-op  Airway Management Planned: Oral ETT  Additional Equipment:   Intra-op Plan:   Post-operative Plan: Extubation in OR  Informed Consent:     Dental advisory given  Plan Discussed with: CRNA  Anesthesia Plan Comments: (GA c Eras)       Anesthesia Quick Evaluation

## 2018-05-31 ENCOUNTER — Ambulatory Visit (HOSPITAL_BASED_OUTPATIENT_CLINIC_OR_DEPARTMENT_OTHER): Payer: BLUE CROSS/BLUE SHIELD | Admitting: Anesthesiology

## 2018-05-31 ENCOUNTER — Other Ambulatory Visit: Payer: Self-pay

## 2018-05-31 ENCOUNTER — Encounter (HOSPITAL_BASED_OUTPATIENT_CLINIC_OR_DEPARTMENT_OTHER): Payer: Self-pay

## 2018-05-31 ENCOUNTER — Encounter (HOSPITAL_BASED_OUTPATIENT_CLINIC_OR_DEPARTMENT_OTHER)
Admission: RE | Disposition: A | Discharge status: Home / Self Care | Payer: Self-pay | Source: Home / Self Care | Attending: Plastic Surgery

## 2018-05-31 ENCOUNTER — Ambulatory Visit (HOSPITAL_BASED_OUTPATIENT_CLINIC_OR_DEPARTMENT_OTHER)
Admission: RE | Admit: 2018-05-31 | Discharge: 2018-05-31 | Disposition: A | Discharge status: Home / Self Care | Payer: BLUE CROSS/BLUE SHIELD | Attending: Plastic Surgery | Admitting: Plastic Surgery

## 2018-05-31 DIAGNOSIS — Z888 Allergy status to other drugs, medicaments and biological substances status: Secondary | ICD-10-CM | POA: Insufficient documentation

## 2018-05-31 DIAGNOSIS — Z79899 Other long term (current) drug therapy: Secondary | ICD-10-CM | POA: Insufficient documentation

## 2018-05-31 DIAGNOSIS — F329 Major depressive disorder, single episode, unspecified: Secondary | ICD-10-CM | POA: Insufficient documentation

## 2018-05-31 DIAGNOSIS — Z881 Allergy status to other antibiotic agents status: Secondary | ICD-10-CM | POA: Insufficient documentation

## 2018-05-31 DIAGNOSIS — Z882 Allergy status to sulfonamides status: Secondary | ICD-10-CM | POA: Diagnosis not present

## 2018-05-31 DIAGNOSIS — Z980 Intestinal bypass and anastomosis status: Secondary | ICD-10-CM | POA: Diagnosis not present

## 2018-05-31 DIAGNOSIS — M1990 Unspecified osteoarthritis, unspecified site: Secondary | ICD-10-CM | POA: Diagnosis not present

## 2018-05-31 DIAGNOSIS — Z1501 Genetic susceptibility to malignant neoplasm of breast: Secondary | ICD-10-CM | POA: Diagnosis not present

## 2018-05-31 DIAGNOSIS — Z9013 Acquired absence of bilateral breasts and nipples: Secondary | ICD-10-CM | POA: Diagnosis not present

## 2018-05-31 DIAGNOSIS — Z421 Encounter for breast reconstruction following mastectomy: Secondary | ICD-10-CM | POA: Insufficient documentation

## 2018-05-31 DIAGNOSIS — Z880 Allergy status to penicillin: Secondary | ICD-10-CM | POA: Insufficient documentation

## 2018-05-31 DIAGNOSIS — Z885 Allergy status to narcotic agent status: Secondary | ICD-10-CM | POA: Insufficient documentation

## 2018-05-31 HISTORY — PX: LIPOSUCTION WITH LIPOFILLING: SHX6436

## 2018-05-31 HISTORY — PX: REMOVAL OF BILATERAL TISSUE EXPANDERS WITH PLACEMENT OF BILATERAL BREAST IMPLANTS: SHX6431

## 2018-05-31 SURGERY — REMOVAL, TISSUE EXPANDER, BREAST, BILATERAL, WITH BILATERAL IMPLANT IMPLANT INSERTION
Anesthesia: General | Site: Leg Upper | Laterality: Bilateral

## 2018-05-31 MED ORDER — ROCURONIUM BROMIDE 100 MG/10ML IV SOLN
INTRAVENOUS | Status: DC | PRN
  Administered 2018-05-31: 50 mg via INTRAVENOUS

## 2018-05-31 MED ORDER — VANCOMYCIN HCL IN DEXTROSE 1-5 GM/200ML-% IV SOLN
INTRAVENOUS | Status: AC
  Filled 2018-05-31: qty 200

## 2018-05-31 MED ORDER — FENTANYL CITRATE (PF) 100 MCG/2ML IJ SOLN
INTRAMUSCULAR | Status: DC | PRN
  Administered 2018-05-31: 50 ug via INTRAVENOUS
  Administered 2018-05-31: 100 ug via INTRAVENOUS

## 2018-05-31 MED ORDER — CHLORHEXIDINE GLUCONATE CLOTH 2 % EX PADS: 6.0000 | MEDICATED_PAD | Freq: Once | CUTANEOUS | Status: DC

## 2018-05-31 MED ORDER — GABAPENTIN 300 MG PO CAPS
300.0000 mg | ORAL_CAPSULE | ORAL | Status: AC
  Administered 2018-05-31: 300 mg via ORAL

## 2018-05-31 MED ORDER — GLYCOPYRROLATE 0.2 MG/ML IJ SOLN
INTRAMUSCULAR | Status: DC | PRN
  Administered 2018-05-31: 0.2 mg via INTRAVENOUS

## 2018-05-31 MED ORDER — HYDROMORPHONE HCL 2 MG PO TABS: 2.0000 mg | ORAL_TABLET | ORAL | 0 refills | Status: AC | PRN

## 2018-05-31 MED ORDER — ACETAMINOPHEN 500 MG PO TABS
1000.0000 mg | ORAL_TABLET | ORAL | Status: AC
  Administered 2018-05-31: 1000 mg via ORAL

## 2018-05-31 MED ORDER — SUGAMMADEX SODIUM 500 MG/5ML IV SOLN
INTRAVENOUS | Status: DC | PRN
  Administered 2018-05-31: 400 mg via INTRAVENOUS

## 2018-05-31 MED ORDER — PROPOFOL 10 MG/ML IV BOLUS
INTRAVENOUS | Status: DC | PRN
  Administered 2018-05-31: 200 mg via INTRAVENOUS

## 2018-05-31 MED ORDER — PHENYLEPHRINE HCL 10 MG/ML IJ SOLN
INTRAMUSCULAR | Status: DC | PRN
  Administered 2018-05-31 (×2): 120 ug via INTRAVENOUS
  Administered 2018-05-31: 40 ug via INTRAVENOUS
  Administered 2018-05-31: 80 ug via INTRAVENOUS

## 2018-05-31 MED ORDER — PROMETHAZINE HCL 25 MG/ML IJ SOLN
INTRAMUSCULAR | Status: DC | PRN
  Administered 2018-05-31: 12.5 mg via INTRAVENOUS

## 2018-05-31 MED ORDER — METHOCARBAMOL 500 MG PO TABS: 500.0000 mg | ORAL_TABLET | Freq: Three times a day (TID) | ORAL | 0 refills | Status: AC | PRN

## 2018-05-31 MED ORDER — GABAPENTIN 300 MG PO CAPS
ORAL_CAPSULE | ORAL | Status: AC
  Filled 2018-05-31: qty 1

## 2018-05-31 MED ORDER — CIPROFLOXACIN HCL 500 MG PO TABS: 500.0000 mg | ORAL_TABLET | Freq: Two times a day (BID) | ORAL | 0 refills | Status: AC

## 2018-05-31 MED ORDER — LIDOCAINE HCL (CARDIAC) PF 100 MG/5ML IV SOSY
PREFILLED_SYRINGE | INTRAVENOUS | Status: DC | PRN
  Administered 2018-05-31: 100 mg via INTRAVENOUS

## 2018-05-31 MED ORDER — VANCOMYCIN HCL IN DEXTROSE 1-5 GM/200ML-% IV SOLN
1000.0000 mg | INTRAVENOUS | Status: AC
  Administered 2018-05-31: 1000 mg via INTRAVENOUS

## 2018-05-31 MED ORDER — KETAMINE HCL 100 MG/ML IJ SOLN
INTRAMUSCULAR | Status: AC
  Filled 2018-05-31: qty 1

## 2018-05-31 MED ORDER — MIDAZOLAM HCL 2 MG/2ML IJ SOLN
1.0000 mg | INTRAMUSCULAR | Status: DC | PRN
  Administered 2018-05-31: 2 mg via INTRAVENOUS

## 2018-05-31 MED ORDER — KETAMINE HCL 10 MG/ML IJ SOLN
INTRAMUSCULAR | Status: DC | PRN
  Administered 2018-05-31: 40 mg via INTRAVENOUS

## 2018-05-31 MED ORDER — CELECOXIB 200 MG PO CAPS
ORAL_CAPSULE | ORAL | Status: AC
  Filled 2018-05-31: qty 1

## 2018-05-31 MED ORDER — ACETAMINOPHEN 500 MG PO TABS
ORAL_TABLET | ORAL | Status: AC
  Filled 2018-05-31: qty 2

## 2018-05-31 MED ORDER — CELECOXIB 200 MG PO CAPS: 200.0000 mg | ORAL_CAPSULE | ORAL | Status: DC

## 2018-05-31 MED ORDER — KETOROLAC TROMETHAMINE 30 MG/ML IJ SOLN: 30.0000 mg | Freq: Once | INTRAMUSCULAR | Status: DC | PRN

## 2018-05-31 MED ORDER — SODIUM CHLORIDE 0.9 % IV SOLN
INTRAVENOUS | Status: AC
  Filled 2018-05-31 (×2): qty 500000

## 2018-05-31 MED ORDER — SCOPOLAMINE 1 MG/3DAYS TD PT72: 1.0000 | MEDICATED_PATCH | Freq: Once | TRANSDERMAL | Status: DC | PRN

## 2018-05-31 MED ORDER — SODIUM BICARBONATE 4.2 % IV SOLN
INTRAVENOUS | Status: AC
  Filled 2018-05-31: qty 10

## 2018-05-31 MED ORDER — PROMETHAZINE HCL 25 MG/ML IJ SOLN: 6.2500 mg | Freq: Once | INTRAMUSCULAR | Status: DC

## 2018-05-31 MED ORDER — SODIUM BICARBONATE 4 % IV SOLN
INTRAVENOUS | Status: DC | PRN
  Administered 2018-05-31: 500 mL via INTRAMUSCULAR

## 2018-05-31 MED ORDER — FENTANYL CITRATE (PF) 100 MCG/2ML IJ SOLN
INTRAMUSCULAR | Status: AC
  Filled 2018-05-31: qty 2

## 2018-05-31 MED ORDER — OXYCODONE HCL 5 MG PO TABS
ORAL_TABLET | ORAL | Status: AC
  Filled 2018-05-31: qty 1

## 2018-05-31 MED ORDER — OXYCODONE HCL 5 MG PO TABS
5.0000 mg | ORAL_TABLET | Freq: Once | ORAL | Status: AC | PRN
  Administered 2018-05-31: 5 mg via ORAL

## 2018-05-31 MED ORDER — EPINEPHRINE 30 MG/30ML IJ SOLN
INTRAMUSCULAR | Status: AC
  Filled 2018-05-31: qty 1

## 2018-05-31 MED ORDER — LACTATED RINGERS IV SOLN
INTRAVENOUS | Status: DC
  Administered 2018-05-31 (×2): via INTRAVENOUS

## 2018-05-31 MED ORDER — MIDAZOLAM HCL 2 MG/2ML IJ SOLN
INTRAMUSCULAR | Status: AC
  Filled 2018-05-31: qty 2

## 2018-05-31 MED ORDER — LIDOCAINE HCL (PF) 1 % IJ SOLN
INTRAMUSCULAR | Status: AC
  Filled 2018-05-31: qty 60

## 2018-05-31 MED ORDER — PROMETHAZINE HCL 12.5 MG PO TABS: 12.5000 mg | ORAL_TABLET | Freq: Four times a day (QID) | ORAL | 0 refills | Status: AC | PRN

## 2018-05-31 MED ORDER — PROPOFOL 500 MG/50ML IV EMUL
INTRAVENOUS | Status: DC | PRN
  Administered 2018-05-31: 08:00:00 via INTRAVENOUS
  Administered 2018-05-31: 200 ug/kg/min via INTRAVENOUS

## 2018-05-31 MED ORDER — HYDROMORPHONE HCL 1 MG/ML IJ SOLN
INTRAMUSCULAR | Status: AC
  Filled 2018-05-31: qty 0.5

## 2018-05-31 MED ORDER — OXYCODONE HCL 5 MG/5ML PO SOLN: 5.0000 mg | Freq: Once | ORAL | Status: AC | PRN

## 2018-05-31 MED ORDER — SODIUM CHLORIDE 0.9 % IV SOLN
INTRAVENOUS | Status: DC | PRN
  Administered 2018-05-31: 1000 mL

## 2018-05-31 MED ORDER — DEXMEDETOMIDINE HCL IN NACL 200 MCG/50ML IV SOLN
INTRAVENOUS | Status: DC | PRN
  Administered 2018-05-31 (×2): 12 ug via INTRAVENOUS

## 2018-05-31 MED ORDER — HYDROMORPHONE HCL 1 MG/ML IJ SOLN
0.2500 mg | INTRAMUSCULAR | Status: DC | PRN
  Administered 2018-05-31: 0.5 mg via INTRAVENOUS

## 2018-05-31 SURGICAL SUPPLY — 86 items
BAG DECANTER FOR FLEXI CONT (MISCELLANEOUS) ×3 IMPLANT
BINDER ABDOMINAL 10 UNV 27-48 (MISCELLANEOUS) IMPLANT
BINDER ABDOMINAL 12 SM 30-45 (SOFTGOODS) IMPLANT
BINDER BREAST 3XL (GAUZE/BANDAGES/DRESSINGS) IMPLANT
BINDER BREAST LRG (GAUZE/BANDAGES/DRESSINGS) IMPLANT
BINDER BREAST MEDIUM (GAUZE/BANDAGES/DRESSINGS) IMPLANT
BINDER BREAST XLRG (GAUZE/BANDAGES/DRESSINGS) IMPLANT
BINDER BREAST XXLRG (GAUZE/BANDAGES/DRESSINGS) IMPLANT
BLADE SURG 10 STRL SS (BLADE) ×6 IMPLANT
BLADE SURG 11 STRL SS (BLADE) ×3 IMPLANT
BNDG GAUZE ELAST 4 BULKY (GAUZE/BANDAGES/DRESSINGS) ×6 IMPLANT
CANISTER LIPO FAT HARVEST (MISCELLANEOUS) ×3 IMPLANT
CANISTER SUCT 1200ML W/VALVE (MISCELLANEOUS) ×6 IMPLANT
CHLORAPREP W/TINT 26 (MISCELLANEOUS) ×9 IMPLANT
COVER BACK TABLE 60X90IN (DRAPES) ×3 IMPLANT
COVER MAYO STAND STRL (DRAPES) ×6 IMPLANT
COVER WAND RF STERILE (DRAPES) IMPLANT
DECANTER SPIKE VIAL GLASS SM (MISCELLANEOUS) IMPLANT
DERMABOND ADVANCED (GAUZE/BANDAGES/DRESSINGS) ×2
DERMABOND ADVANCED .7 DNX12 (GAUZE/BANDAGES/DRESSINGS) ×4 IMPLANT
DRAIN CHANNEL 15F RND FF W/TCR (WOUND CARE) IMPLANT
DRAPE TOP ARMCOVERS (MISCELLANEOUS) ×3 IMPLANT
DRAPE U-SHAPE 76X120 STRL (DRAPES) ×6 IMPLANT
DRSG PAD ABDOMINAL 8X10 ST (GAUZE/BANDAGES/DRESSINGS) ×6 IMPLANT
ELECT BLADE 4.0 EZ CLEAN MEGAD (MISCELLANEOUS)
ELECT COATED BLADE 2.86 ST (ELECTRODE) ×3 IMPLANT
ELECT REM PT RETURN 9FT ADLT (ELECTROSURGICAL) ×3
ELECTRODE BLDE 4.0 EZ CLN MEGD (MISCELLANEOUS) IMPLANT
ELECTRODE REM PT RTRN 9FT ADLT (ELECTROSURGICAL) ×2 IMPLANT
EVACUATOR SILICONE 100CC (DRAIN) IMPLANT
EXTRACTOR CANIST REVOLVE STRL (CANNISTER) IMPLANT
GLOVE BIO SURGEON STRL SZ 6 (GLOVE) ×9 IMPLANT
GLOVE BIO SURGEON STRL SZ7 (GLOVE) ×3 IMPLANT
GLOVE BIOGEL PI IND STRL 7.0 (GLOVE) ×4 IMPLANT
GLOVE BIOGEL PI IND STRL 7.5 (GLOVE) ×2 IMPLANT
GLOVE BIOGEL PI INDICATOR 7.0 (GLOVE) ×2
GLOVE BIOGEL PI INDICATOR 7.5 (GLOVE) ×1
GOWN STRL REUS W/ TWL LRG LVL3 (GOWN DISPOSABLE) ×4 IMPLANT
GOWN STRL REUS W/TWL LRG LVL3 (GOWN DISPOSABLE) ×2
IMPL BREAST SILICONE 525CC (Breast) ×4 IMPLANT
IMPLANT BREAST SILICONE 525CC (Breast) ×6 IMPLANT
IV NS 500ML (IV SOLUTION)
IV NS 500ML BAXH (IV SOLUTION) IMPLANT
KIT FILL SYSTEM UNIVERSAL (SET/KITS/TRAYS/PACK) IMPLANT
LINER CANISTER 1000CC FLEX (MISCELLANEOUS) ×3 IMPLANT
MARKER SKIN DUAL TIP RULER LAB (MISCELLANEOUS) IMPLANT
NDL SAFETY ECLIPSE 18X1.5 (NEEDLE) ×2 IMPLANT
NEEDLE FILTER BLUNT 18X 1/2SAF (NEEDLE)
NEEDLE FILTER BLUNT 18X1 1/2 (NEEDLE) IMPLANT
NEEDLE HYPO 18GX1.5 SHARP (NEEDLE) ×1
NEEDLE HYPO 25X1 1.5 SAFETY (NEEDLE) IMPLANT
NS IRRIG 1000ML POUR BTL (IV SOLUTION) ×6 IMPLANT
PACK BASIN DAY SURGERY FS (CUSTOM PROCEDURE TRAY) ×3 IMPLANT
PAD ALCOHOL SWAB (MISCELLANEOUS) ×3 IMPLANT
PENCIL BUTTON HOLSTER BLD 10FT (ELECTRODE) ×3 IMPLANT
PIN SAFETY STERILE (MISCELLANEOUS) IMPLANT
PUNCH BIOPSY DERMAL 4MM (MISCELLANEOUS) IMPLANT
SHEET MEDIUM DRAPE 40X70 STRL (DRAPES) ×6 IMPLANT
SIZER BREAST REUSE GEL 525CC (SIZER) ×3
SIZER BREAST REUSE GEL 545CC (SIZER) ×3
SIZER BRST REUSE GEL 525CC (SIZER) ×2 IMPLANT
SIZER BRST REUSE GEL 545CC (SIZER) ×2 IMPLANT
SLEEVE SCD COMPRESS KNEE MED (MISCELLANEOUS) ×3 IMPLANT
SPONGE LAP 18X18 RF (DISPOSABLE) ×12 IMPLANT
STAPLER VISISTAT 35W (STAPLE) ×6 IMPLANT
SUT ETHILON 2 0 FS 18 (SUTURE) IMPLANT
SUT MNCRL AB 4-0 PS2 18 (SUTURE) ×6 IMPLANT
SUT PDS AB 2-0 CT2 27 (SUTURE) IMPLANT
SUT VIC AB 3-0 PS1 18 (SUTURE)
SUT VIC AB 3-0 PS1 18XBRD (SUTURE) IMPLANT
SUT VIC AB 3-0 SH 27 (SUTURE) ×2
SUT VIC AB 3-0 SH 27X BRD (SUTURE) ×4 IMPLANT
SUT VICRYL 4-0 PS2 18IN ABS (SUTURE) ×6 IMPLANT
SYR 10ML LL (SYRINGE) ×9 IMPLANT
SYR 20CC LL (SYRINGE) IMPLANT
SYR 50ML LL SCALE MARK (SYRINGE) ×9 IMPLANT
SYR BULB IRRIGATION 50ML (SYRINGE) ×6 IMPLANT
SYR CONTROL 10ML LL (SYRINGE) IMPLANT
SYR TB 1ML LL NO SAFETY (SYRINGE) ×3 IMPLANT
SYRINGE TOOMEY DISP (SYRINGE) IMPLANT
TOWEL GREEN STERILE FF (TOWEL DISPOSABLE) ×9 IMPLANT
TUBE CONNECTING 20X1/4 (TUBING) ×6 IMPLANT
TUBING INFILTRATION IT-10001 (TUBING) ×6 IMPLANT
TUBING SET GRADUATE ASPIR 12FT (MISCELLANEOUS) ×3 IMPLANT
UNDERPAD 30X30 (UNDERPADS AND DIAPERS) ×6 IMPLANT
YANKAUER SUCT BULB TIP NO VENT (SUCTIONS) ×3 IMPLANT

## 2018-05-31 NOTE — Discharge Instructions (Signed)
No Tylenol until 12:45pm if needed   Post Anesthesia Home Care Instructions  Activity: Get plenty of rest for the remainder of the day. A responsible individual must stay with you for 24 hours following the procedure.  For the next 24 hours, DO NOT: -Drive a car -Paediatric nurse -Drink alcoholic beverages -Take any medication unless instructed by your physician -Make any legal decisions or sign important papers.  Meals: Start with liquid foods such as gelatin or soup. Progress to regular foods as tolerated. Avoid greasy, spicy, heavy foods. If nausea and/or vomiting occur, drink only clear liquids until the nausea and/or vomiting subsides. Call your physician if vomiting continues.  Special Instructions/Symptoms: Your throat may feel dry or sore from the anesthesia or the breathing tube placed in your throat during surgery. If this causes discomfort, gargle with warm salt water. The discomfort should disappear within 24 hours.  If you had a scopolamine patch placed behind your ear for the management of post- operative nausea and/or vomiting:  1. The medication in the patch is effective for 72 hours, after which it should be removed.  Wrap patch in a tissue and discard in the trash. Wash hands thoroughly with soap and water. 2. You may remove the patch earlier than 72 hours if you experience unpleasant side effects which may include dry mouth, dizziness or visual disturbances. 3. Avoid touching the patch. Wash your hands with soap and water after contact with the patch.

## 2018-05-31 NOTE — Op Note (Signed)
Operative Note   DATE OF OPERATION: 3.10.20  LOCATION: [facility]-outpatient  SURGICAL DIVISION: Plastic Surgery  PREOPERATIVE DIAGNOSES:  1. Acquired absence breasts 2. BRCA1  POSTOPERATIVE DIAGNOSES:  same  PROCEDURE:  1. Removal bilateral tissue expanders and placement silicone implants 2. Lipofilling from bilateral medial thighs to bilateral chest  SURGEON: Irene Limbo MD MBA  ASSISTANT: none  ANESTHESIA:  General.   EBL: 50 ml  COMPLICATIONS: None immediate.   INDICATIONS FOR PROCEDURE:  The patient, [name], is a 43 y.o. female born on 09/14/1975, is here for staged breast reconstruction following bilateral skin reduction pattern mastectomies with immediate prepectoral expander acellular dermis reconstruction.   FINDINGS: 60 ml fat infiltrated over right chest, 60 ml fat infiltrated over left chest. Natrelle Inspira Extra Projection Smooth Round 525 ml implants placed bilateral. REF SRX-525 RIGHT SN [id number] LEFT SN [id number]  DESCRIPTION OF PROCEDURE:  The patient's operative site was marked with the patient in the preoperative area to mark sternal notch, chest midline, anterior axillary lines.Medial thighs marked for liposuction.The patientwas taken to the operating room. SCDs were placed and IV antibiotics were given. The patient's operative site was prepped and draped in a sterile fashion. A time out was performed and all information was confirmed to be correct.I began onleftside. Incision made through prior inframammary fold scar and carried through superficial fascia to acellular demis. ADM incised. Expander removed. Capsulotomies performedsuperiorly and thermal capsulorraphy performed over anterior axillary line. Full incorporation ADM noted. Sizer placed.  I then directed attention torightbreast and implant cavity entered in similar manner. Expander removed and well incorporated ADM noted. Sizer placed and patient brought to upright sitting  position. Natrelle Smooth Round Extra Projection 525 ml implants selected for bilateral placement.The patient was returned to supine position.  Stab incision made over bilateralmedial thighand tumescent fluid infiltrated over medial thighs,total421m tumescent infiltrated. Power assisted liposuction performed to endpoint symmetric contour and soft tissue thickness, total lipoaspirate400 ml. The fat was then washed and prepared by gravity for infiltration. Harvested fat was then infiltrated in subcutaneous plane throughout total envelope mastectomy flaps.  Each cavity irrigated with bacitracin polymyxinsolution.Hemostasis ensured.Each cavity then irrigated with Betadine. The implant was placed inrighttchest andimplant orientation ensured. Closure completed with 3-0 vicryl to close superficial fascia and ADM over implant. 4-0 vicryl used to close dermis followed by 4-0 monocryl subcuticular. Implant placed in left chest cavity.Closure completedin similar fashion.Abdomen incisions approximated with simple 4-0 monocryl stitch.Tissue adhesive and dry dressing applied, followed by breastbinder and thigh compression garment.  The patient was allowed to wake from anesthesia, extubated and taken to the recovery room in satisfactory condition.   SPECIMENS: none  DRAINS: none  BIrene Limbo MD [facility] [phone] pin [phone]

## 2018-05-31 NOTE — Anesthesia Procedure Notes (Signed)
Procedure Name: Intubation Performed by: Verita Lamb, CRNA Pre-anesthesia Checklist: Patient identified, Emergency Drugs available, Suction available, Patient being monitored and Timeout performed Patient Re-evaluated:Patient Re-evaluated prior to induction Oxygen Delivery Method: Circle system utilized Preoxygenation: Pre-oxygenation with 100% oxygen Induction Type: IV induction Ventilation: Mask ventilation without difficulty Laryngoscope Size: Mac and 3 Grade View: Grade I Tube type: Oral Tube size: 7.0 mm Airway Equipment and Method: Stylet Placement Confirmation: ETT inserted through vocal cords under direct vision,  positive ETCO2,  CO2 detector and breath sounds checked- equal and bilateral Secured at: 23 cm Tube secured with: Tape Dental Injury: Teeth and Oropharynx as per pre-operative assessment

## 2018-05-31 NOTE — Transfer of Care (Signed)
Immediate Anesthesia Transfer of Care Note  Patient: [name]  Procedure(s) Performed: REMOVAL OF BILATERAL TISSUE EXPANDERS WITH PLACEMENT OF BILATERAL BREAST IMPLANTS (Bilateral Breast) LIPOFILLING BILATERAL CHEST (Bilateral Chest)  Patient Location: PACU  Anesthesia Type:General  Level of Consciousness: awake, alert  and oriented  Airway & Oxygen Therapy: Patient Spontanous Breathing and Patient connected to face mask oxygen  Post-op Assessment: Report given to RN and Post -op Vital signs reviewed and stable  Post vital signs: Reviewed and stable  Last Vitals:  Vitals Value Taken Time  BP    Temp    Pulse    Resp    SpO2      Last Pain:  Vitals:   05/31/18 0649  TempSrc: Oral  PainSc:       Patients Stated Pain Goal: 4 (22/84/06 9861)  Complications: No apparent anesthesia complications

## 2018-05-31 NOTE — Anesthesia Postprocedure Evaluation (Signed)
Anesthesia Post Note  Patient: [name]  Procedure(s) Performed: REMOVAL OF BILATERAL TISSUE EXPANDERS WITH PLACEMENT OF BILATERAL BREAST IMPLANTS (Bilateral Breast) LIPOFILLING BILATERAL CHEST (Bilateral Chest)     Patient location during evaluation: PACU Anesthesia Type: General Level of consciousness: awake and alert Pain management: pain level controlled Vital Signs Assessment: post-procedure vital signs reviewed and stable Respiratory status: spontaneous breathing, nonlabored ventilation, respiratory function stable and patient connected to nasal cannula oxygen Cardiovascular status: blood pressure returned to baseline and stable Postop Assessment: no apparent nausea or vomiting Anesthetic complications: no    Last Vitals:  Vitals:   05/31/18 1100 05/31/18 1141  BP: 107/64 126/88  Pulse: 77 75  Resp: 14 18  Temp:  36.5 C  SpO2: 100% 99%    Last Pain:  Vitals:   05/31/18 1141  TempSrc:   PainSc: 4    Pain Goal: Patients Stated Pain Goal: 4 (05/31/18 0635)                 Barnet Glasgow

## 2018-05-31 NOTE — H&P (Signed)
Patient ID: [name] is a 43 y.o. female.  HPI  4 months post op bilateral mastectomies with immediate expander ADM reconstruction. Here for implant placement and lipofilling.   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 Has undergone TVH/BSO.  Last MMG 07/2017 at Dr. Ophelia Charter office. Pathology mastectomies benign.  History Roux en Y bypass 2013. Highest wt 307 lb. Lowest weight is current and has been stable at this for years. DM, OSA, GERD resolved following this.  Prior to wt loss 42 DD, prior to mastectomies 38 C, happy with this. Right mastectomy 638 g Left 659 g  She is married with 3 daughters and works as a Fish farm manager. 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 PMH includes migraines for which she receives Botox.     Objective:   Physical Exam  Cardiovascular: Normal rate, regular rhythm and normal heart sounds.  Pulmonary/Chest: Effort normal and breath sounds normal.   Chest: Soft bilateral few dry scabs along vertical incisions rippling bilateral superior pole Thighs medial thighs with sufficient soft tissue pinch for donor site with minimal laxity or redundancy  Assessment:     BRCA1 S/p bilateral SRM, prepectoral TE/ADM (Alloderm) reconstruction    Plan:     Plan bilateral removal TE placement implants, lipofilling bilateral chest from medial thighs.   Plan use of IMF incision chest.   Reviewed saline vs silicone. As in prepectoral position I do recommend HCG or capacity filled silicone implants to reduce risk visible rippling. Reviewed MRI or USsurveillance for rupture with silicone implants.Reviewed examples for 4th generation, capacity filled 4th generation, and HCG implants vs saline implants. Reviewed risks AP flipping that may be more noticeable with 5th generation implants, may require surgery to correct. She has elected for silicone  Plan smooth round  capacity filled.  Reviewed role of fat grafting to improve contour, reduce visible rippling. Discussed variable take graft, need to repeat procedure, fat necrosis that presents as lumps, cysts. Given her history MWL, liposuction to any donor site may be accompanied by increased redundant skin. Plan medial thigh donor site. Recommend she purchase Spanx type garment, bring to surgery.  Reviewed cannot assure cup size, size implants largely selected based on width chest.   Plan OP surgery, no drains anticipated. Plan oral Dilaudid for home, plan limit to two weeks post op. She also used muscle relaxant and phenegran.    Natrelle 133S FV-12-T 400 ml tissue expanders placed bilateral.   fill volume 400 ml saline bilateral  Irene Limbo, MD [facility] Plastic & Reconstructive Surgery [phone], pin [phone]

## 2018-06-01 ENCOUNTER — Encounter (HOSPITAL_BASED_OUTPATIENT_CLINIC_OR_DEPARTMENT_OTHER): Payer: Self-pay | Admitting: Plastic Surgery

## 2018-06-08 DIAGNOSIS — D225 Melanocytic nevi of trunk: Secondary | ICD-10-CM | POA: Diagnosis not present

## 2018-06-08 DIAGNOSIS — B029 Zoster without complications: Secondary | ICD-10-CM | POA: Diagnosis not present

## 2018-06-08 DIAGNOSIS — D485 Neoplasm of uncertain behavior of skin: Secondary | ICD-10-CM | POA: Diagnosis not present

## 2018-07-14 DIAGNOSIS — Z1501 Genetic susceptibility to malignant neoplasm of breast: Secondary | ICD-10-CM | POA: Diagnosis not present

## 2018-07-14 DIAGNOSIS — F988 Other specified behavioral and emotional disorders with onset usually occurring in childhood and adolescence: Secondary | ICD-10-CM | POA: Diagnosis not present

## 2018-07-14 DIAGNOSIS — F4321 Adjustment disorder with depressed mood: Secondary | ICD-10-CM | POA: Diagnosis not present

## 2018-07-14 DIAGNOSIS — Z1509 Genetic susceptibility to other malignant neoplasm: Secondary | ICD-10-CM | POA: Diagnosis not present

## 2018-08-11 DIAGNOSIS — Z1501 Genetic susceptibility to malignant neoplasm of breast: Secondary | ICD-10-CM | POA: Diagnosis not present

## 2018-08-11 DIAGNOSIS — F4321 Adjustment disorder with depressed mood: Secondary | ICD-10-CM | POA: Diagnosis not present

## 2018-08-11 DIAGNOSIS — Z1509 Genetic susceptibility to other malignant neoplasm: Secondary | ICD-10-CM | POA: Diagnosis not present

## 2018-08-11 DIAGNOSIS — A09 Infectious gastroenteritis and colitis, unspecified: Secondary | ICD-10-CM | POA: Diagnosis not present

## 2018-08-11 DIAGNOSIS — E162 Hypoglycemia, unspecified: Secondary | ICD-10-CM | POA: Diagnosis not present

## 2018-08-11 DIAGNOSIS — F988 Other specified behavioral and emotional disorders with onset usually occurring in childhood and adolescence: Secondary | ICD-10-CM | POA: Diagnosis not present

## 2018-08-18 DIAGNOSIS — G43709 Chronic migraine without aura, not intractable, without status migrainosus: Secondary | ICD-10-CM | POA: Diagnosis not present

## 2018-09-08 DIAGNOSIS — G43709 Chronic migraine without aura, not intractable, without status migrainosus: Secondary | ICD-10-CM | POA: Diagnosis not present

## 2018-10-02 DIAGNOSIS — H6091 Unspecified otitis externa, right ear: Secondary | ICD-10-CM | POA: Diagnosis not present

## 2018-10-02 DIAGNOSIS — B349 Viral infection, unspecified: Secondary | ICD-10-CM | POA: Diagnosis not present

## 2018-10-02 DIAGNOSIS — J309 Allergic rhinitis, unspecified: Secondary | ICD-10-CM | POA: Diagnosis not present

## 2018-11-05 DIAGNOSIS — M25561 Pain in right knee: Secondary | ICD-10-CM | POA: Diagnosis not present

## 2018-11-10 DIAGNOSIS — G43709 Chronic migraine without aura, not intractable, without status migrainosus: Secondary | ICD-10-CM | POA: Diagnosis not present

## 2018-11-12 DIAGNOSIS — M25561 Pain in right knee: Secondary | ICD-10-CM | POA: Diagnosis not present

## 2018-11-17 DIAGNOSIS — M25561 Pain in right knee: Secondary | ICD-10-CM | POA: Diagnosis not present

## 2018-11-30 DIAGNOSIS — S2231XA Fracture of one rib, right side, initial encounter for closed fracture: Secondary | ICD-10-CM | POA: Diagnosis not present

## 2018-12-01 DIAGNOSIS — F4321 Adjustment disorder with depressed mood: Secondary | ICD-10-CM | POA: Diagnosis not present

## 2018-12-01 DIAGNOSIS — Z1502 Genetic susceptibility to malignant neoplasm of ovary: Secondary | ICD-10-CM | POA: Diagnosis not present

## 2018-12-01 DIAGNOSIS — F988 Other specified behavioral and emotional disorders with onset usually occurring in childhood and adolescence: Secondary | ICD-10-CM | POA: Diagnosis not present

## 2018-12-01 DIAGNOSIS — Z9013 Acquired absence of bilateral breasts and nipples: Secondary | ICD-10-CM | POA: Diagnosis not present

## 2018-12-01 DIAGNOSIS — Z1501 Genetic susceptibility to malignant neoplasm of breast: Secondary | ICD-10-CM | POA: Diagnosis not present

## 2018-12-01 DIAGNOSIS — Z1509 Genetic susceptibility to other malignant neoplasm: Secondary | ICD-10-CM | POA: Diagnosis not present

## 2018-12-01 DIAGNOSIS — S20211A Contusion of right front wall of thorax, initial encounter: Secondary | ICD-10-CM | POA: Diagnosis not present

## 2018-12-12 DIAGNOSIS — G43709 Chronic migraine without aura, not intractable, without status migrainosus: Secondary | ICD-10-CM | POA: Diagnosis not present

## 2018-12-12 DIAGNOSIS — S2231XD Fracture of one rib, right side, subsequent encounter for fracture with routine healing: Secondary | ICD-10-CM | POA: Diagnosis not present

## 2019-02-07 DIAGNOSIS — Z20828 Contact with and (suspected) exposure to other viral communicable diseases: Secondary | ICD-10-CM | POA: Diagnosis not present

## 2019-02-10 DIAGNOSIS — J069 Acute upper respiratory infection, unspecified: Secondary | ICD-10-CM | POA: Diagnosis not present

## 2019-02-10 DIAGNOSIS — Z20828 Contact with and (suspected) exposure to other viral communicable diseases: Secondary | ICD-10-CM | POA: Diagnosis not present

## 2019-02-28 DIAGNOSIS — G43709 Chronic migraine without aura, not intractable, without status migrainosus: Secondary | ICD-10-CM | POA: Diagnosis not present

## 2019-03-08 DIAGNOSIS — R52 Pain, unspecified: Secondary | ICD-10-CM | POA: Diagnosis not present

## 2019-03-08 DIAGNOSIS — R6883 Chills (without fever): Secondary | ICD-10-CM | POA: Diagnosis not present

## 2019-03-08 DIAGNOSIS — R05 Cough: Secondary | ICD-10-CM | POA: Diagnosis not present

## 2019-03-27 DIAGNOSIS — Z01419 Encounter for gynecological examination (general) (routine) without abnormal findings: Secondary | ICD-10-CM | POA: Diagnosis not present

## 2019-03-27 DIAGNOSIS — N951 Menopausal and female climacteric states: Secondary | ICD-10-CM | POA: Diagnosis not present

## 2019-03-27 DIAGNOSIS — Z6830 Body mass index (BMI) 30.0-30.9, adult: Secondary | ICD-10-CM | POA: Diagnosis not present

## 2019-04-17 DIAGNOSIS — G43709 Chronic migraine without aura, not intractable, without status migrainosus: Secondary | ICD-10-CM | POA: Diagnosis not present

## 2019-06-12 DIAGNOSIS — L659 Nonscarring hair loss, unspecified: Secondary | ICD-10-CM | POA: Diagnosis not present

## 2019-06-12 DIAGNOSIS — F329 Major depressive disorder, single episode, unspecified: Secondary | ICD-10-CM | POA: Diagnosis not present

## 2019-06-12 DIAGNOSIS — R7303 Prediabetes: Secondary | ICD-10-CM | POA: Diagnosis not present

## 2019-06-12 DIAGNOSIS — F988 Other specified behavioral and emotional disorders with onset usually occurring in childhood and adolescence: Secondary | ICD-10-CM | POA: Diagnosis not present

## 2019-06-12 DIAGNOSIS — G43409 Hemiplegic migraine, not intractable, without status migrainosus: Secondary | ICD-10-CM | POA: Diagnosis not present

## 2019-06-12 DIAGNOSIS — E782 Mixed hyperlipidemia: Secondary | ICD-10-CM | POA: Diagnosis not present

## 2019-06-12 DIAGNOSIS — D509 Iron deficiency anemia, unspecified: Secondary | ICD-10-CM | POA: Diagnosis not present

## 2019-07-16 ENCOUNTER — Emergency Department (HOSPITAL_COMMUNITY)
Admission: EM | Admit: 2019-07-16 | Discharge: 2019-07-16 | Disposition: A | Discharge status: Home / Self Care | Payer: BC Managed Care – PPO | Attending: Emergency Medicine | Admitting: Emergency Medicine

## 2019-07-16 ENCOUNTER — Other Ambulatory Visit: Payer: Self-pay

## 2019-07-16 ENCOUNTER — Encounter (HOSPITAL_COMMUNITY): Payer: Self-pay | Admitting: Emergency Medicine

## 2019-07-16 ENCOUNTER — Emergency Department (HOSPITAL_COMMUNITY): Payer: BC Managed Care – PPO

## 2019-07-16 DIAGNOSIS — R61 Generalized hyperhidrosis: Secondary | ICD-10-CM | POA: Diagnosis not present

## 2019-07-16 DIAGNOSIS — R072 Precordial pain: Secondary | ICD-10-CM | POA: Diagnosis not present

## 2019-07-16 DIAGNOSIS — E119 Type 2 diabetes mellitus without complications: Secondary | ICD-10-CM | POA: Insufficient documentation

## 2019-07-16 DIAGNOSIS — R197 Diarrhea, unspecified: Secondary | ICD-10-CM | POA: Insufficient documentation

## 2019-07-16 DIAGNOSIS — R0902 Hypoxemia: Secondary | ICD-10-CM | POA: Diagnosis not present

## 2019-07-16 DIAGNOSIS — R0789 Other chest pain: Secondary | ICD-10-CM | POA: Diagnosis not present

## 2019-07-16 DIAGNOSIS — Z79899 Other long term (current) drug therapy: Secondary | ICD-10-CM | POA: Insufficient documentation

## 2019-07-16 DIAGNOSIS — R0602 Shortness of breath: Secondary | ICD-10-CM | POA: Diagnosis not present

## 2019-07-16 DIAGNOSIS — R200 Anesthesia of skin: Secondary | ICD-10-CM | POA: Insufficient documentation

## 2019-07-16 DIAGNOSIS — I959 Hypotension, unspecified: Secondary | ICD-10-CM | POA: Diagnosis not present

## 2019-07-16 DIAGNOSIS — R110 Nausea: Secondary | ICD-10-CM | POA: Diagnosis not present

## 2019-07-16 DIAGNOSIS — R079 Chest pain, unspecified: Secondary | ICD-10-CM | POA: Diagnosis not present

## 2019-07-16 LAB — BASIC METABOLIC PANEL
Anion gap: 10 (ref 5–15)
BUN: 14 mg/dL (ref 6–20)
CO2: 28 mmol/L (ref 22–32)
Calcium: 9.1 mg/dL (ref 8.9–10.3)
Chloride: 103 mmol/L (ref 98–111)
Creatinine, Ser: 0.7 mg/dL (ref 0.44–1.00)
GFR calc Af Amer: >60 mL/min (ref >60)
GFR calc non Af Amer: >60 mL/min (ref >60)
Glucose, Bld: 72 mg/dL (ref 70–99)
Potassium: 4.3 mmol/L (ref 3.5–5.1)
Sodium: 141 mmol/L (ref 135–145)

## 2019-07-16 LAB — CBC
HCT: 44.3 % (ref 36.0–46.0)
Hemoglobin: 13.1 g/dL (ref 12.0–15.0)
MCH: 23.9 pg — ABNORMAL LOW (ref 26.0–34.0)
MCHC: 29.6 g/dL — ABNORMAL LOW (ref 30.0–36.0)
MCV: 80.7 fL (ref 80.0–100.0)
Platelets: 272 10*3/uL (ref 150–400)
RBC: 5.49 MIL/uL — ABNORMAL HIGH (ref 3.87–5.11)
RDW: 16 % — ABNORMAL HIGH (ref 11.5–15.5)
WBC: 5.4 10*3/uL (ref 4.0–10.5)
nRBC: 0 % (ref 0.0–0.2)

## 2019-07-16 LAB — TROPONIN I (HIGH SENSITIVITY)
Troponin I (High Sensitivity): <2 ng/L (ref <18)
Troponin I (High Sensitivity): <2 ng/L (ref <18)

## 2019-07-16 MED ORDER — SODIUM CHLORIDE 0.9% FLUSH: 3.0000 mL | Freq: Once | INTRAVENOUS | Status: DC

## 2019-07-16 MED ORDER — LACTATED RINGERS IV BOLUS
1000.0000 mL | Freq: Once | INTRAVENOUS | Status: AC
  Administered 2019-07-16: 17:00:00 1000 mL via INTRAVENOUS

## 2019-07-16 MED ORDER — ACETAMINOPHEN 500 MG PO TABS
1000.0000 mg | ORAL_TABLET | Freq: Once | ORAL | Status: AC
  Administered 2019-07-16: 16:00:00 1000 mg via ORAL
  Filled 2019-07-16: qty 2

## 2019-07-16 MED ORDER — PROMETHAZINE HCL 25 MG PO TABS
12.5000 mg | ORAL_TABLET | Freq: Once | ORAL | Status: AC
  Administered 2019-07-16: 17:00:00 12.5 mg via ORAL
  Filled 2019-07-16: qty 1

## 2019-07-16 NOTE — ED Notes (Signed)
PT now complaining of jaw pain and chest pain. Letting RN know.

## 2019-07-16 NOTE — ED Provider Notes (Signed)
[facility] EMERGENCY DEPARTMENT Provider Note   CSN: [id number] Arrival date & time: 07/16/19  1216     History Chief Complaint  Patient presents with  . Chest Pain    [name] is a 44 y.o. female.  HPI  HPI: A 44 year old patient with a history of treated diabetes and obesity presents for evaluation of chest pain. Initial onset of pain was more than 6 hours ago. The patient's chest pain is described as heaviness/pressure/tightness and is not worse with exertion. The patient complains of nausea and reports some diaphoresis. The patient's chest pain is middle- or left-sided, is not well-localized, is not sharp and does radiate to the arms/jaw/neck. The patient has no history of stroke, has no history of peripheral artery disease, has not smoked in the past 90 days, has no relevant family history of coronary artery disease (first degree relative at less than age 93), is not hypertensive and has no history of hypercholesterolemia.    44 year old female with a past medical history of diabetes, multiple psychiatric disorders, DJD with chronic low back pain presenting to the department complaining of intermittent left-sided chest pain described as pressure like soreness for the past 3 days with associated shortness of breath, nausea and diaphoresis.  Patient took aspirin prior to arrival with EMS as well as 1 sublingual nitroglycerin after which she was hypertensive. The pain is not worse with deep breathing. 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 The patient is still having mild chest pain and states in radiates to her back. She states she has left jaw and LUE paraesthesias. She had 2 episodes of diarrhea over the past 2 days.  Denies any fevers, chills, cough, congestion, rhinorrhea, abdominal pain, changes in urinary function.  She has had decreased p.o. intake today and yesterday.   Patient reports feeling thirsty.  She denies any systemic estrogen therapy, recent long trips, flights or immobilization, no recent surgeries, no unilateral calf swelling or pain, no hemoptysis, no prior blood clots.  Patient denies any family history of heart attacks or sudden death at an age less than 105.  Past Medical History:  Diagnosis Date  . ADD (attention deficit disorder)   . Allergy    Seasonal  . Annular disc tear    RESOLVED   . Anxiety   . Bulging disc   . Complication of anesthesia    reports that blood pressure dropped post -op childbirth   . Degenerative disc disease   . Depression   . Herniated disc   . 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 Retrolisthesis    L4 and L5    Patient Active Problem List   Diagnosis Date Noted  . BRCA1 genetic carrier 01/28/2018  . S/P laparoscopic assisted vaginal hysterectomy (LAVH) 11/11/2017  . BRCA1 gene mutation positive in female 09/07/2017  . Abdominal pain 04/06/2014  . Adjustment disorder with mixed anxiety and depressed mood 04/27/2013  . Generalized anxiety disorder 04/26/2013  . Abdominal pain, left upper quadrant 03/29/2013  . Depression 12/28/2012  . History of prescription drug abuse 12/09/2012  . Acute encephalopathy 10/03/2012  . Other and unspecified postsurgical nonabsorption 05/19/2012  . Follow-up examination, following other surgery 05/19/2012  . Marginal ulcer 04/15/2012  . 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 LUQ abdominal pain 03/12/2012  . Diabetes  mellitus ([place]) 01/19/2012  . OSA on CPAP 01/19/2012  . History of migraine 01/19/2012  . Morbid obesity ([place]) 08/20/2011  . Chest pain 07/28/2011  . Hemiplegia affecting left nondominant side ([place]) 07/28/2011  . 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 Conversion disorder 03/10/2011  . Abdominal pain 03/10/2011  . Diarrhea 03/10/2011    Past Surgical History:  Procedure Laterality Date   . ABDOMINAL HYSTERECTOMY    . BOWEL RESECTION  11/30/2011   Procedure: SMALL BOWEL RESECTION;  Surgeon: Edward Jolly, MD;  Location: [facility];  Service: General;;  . BREAST RECONSTRUCTION WITH PLACEMENT OF TISSUE EXPANDER AND ALLODERM Bilateral 01/28/2018   Procedure: BILATERAL BREAST RECONSTRUCTION WITH PLACEMENT OF TISSUE EXPANDER AND ALLODERM;  Surgeon: Irene Limbo, MD;  Location: [facility];  Service: Plastics;  Laterality: Bilateral;  . BREATH TEK H PYLORI  09/11/2011   Procedure: BREATH TEK H PYLORI;  Surgeon: Edward Jolly, MD;  Location: [facility];  Service: General;  Laterality: N/A;  . CESAREAN SECTION    . CHOLECYSTECTOMY    . CYSTOSCOPY N/A 11/11/2017   Procedure: CYSTOSCOPY;  Surgeon: Arvella Nigh, MD;  Location: [facility];  Service: Gynecology;  Laterality: N/A;  . ESOPHAGOGASTRODUODENOSCOPY  03/14/2012   Procedure: ESOPHAGOGASTRODUODENOSCOPY (EGD);  Surgeon: Shann Medal, MD;  Location: [facility];  Service: General;  Laterality: N/A;  . ESOPHAGOGASTRODUODENOSCOPY  04/25/2012   Procedure: ESOPHAGOGASTRODUODENOSCOPY (EGD);  Surgeon: Shann Medal, MD;  Location: [facility];  Service: General;  Laterality: N/A;  . ESOPHAGOGASTRODUODENOSCOPY N/A 03/31/2013   Procedure: ESOPHAGOGASTRODUODENOSCOPY (EGD);  Surgeon: Shann Medal, MD;  Location: [facility];  Service: General;  Laterality: N/A;  . ESOPHAGOGASTRODUODENOSCOPY N/A 04/09/2014   Procedure: ESOPHAGOGASTRODUODENOSCOPY (EGD);  Surgeon: Alphonsa Overall, MD;  Location: [facility];  Service: General;  Laterality: N/A;  . GASTRIC ROUX-EN-Y  11/30/2011   Procedure: LAPAROSCOPIC ROUX-EN-Y GASTRIC;  Surgeon: Edward Jolly, MD;  Location: [facility];  Service: General;  Laterality: N/A;  . HYSTERECTOMY ABDOMINAL WITH SALPINGO-OOPHORECTOMY    . LAPAROSCOPIC VAGINAL HYSTERECTOMY WITH SALPINGO OOPHORECTOMY Bilateral 11/11/2017   Procedure: LAPAROSCOPIC ASSISTED VAGINAL HYSTERECTOMY WITH SALPINGO  OOPHORECTOMY;  Surgeon: Arvella Nigh, MD;  Location: [facility];  Service: Gynecology;  Laterality: Bilateral;  . laporoscopy  2007  . LIPOSUCTION WITH LIPOFILLING Bilateral 05/31/2018   Procedure: LIPOFILLING BILATERAL CHEST;  Surgeon: Irene Limbo, MD;  Location: [facility];  Service: Plastics;  Laterality: Bilateral;  . REMOVAL OF BILATERAL TISSUE EXPANDERS WITH PLACEMENT OF BILATERAL BREAST IMPLANTS Bilateral 05/31/2018   Procedure: REMOVAL OF BILATERAL TISSUE EXPANDERS WITH PLACEMENT OF BILATERAL BREAST IMPLANTS;  Surgeon: Irene Limbo, MD;  Location: [facility];  Service: Plastics;  Laterality: Bilateral;  . TONSILLECTOMY    . TOTAL MASTECTOMY Bilateral 01/28/2018    BILATERAL TOTAL MASTECTOMY (Bilateral Breast)  . TOTAL MASTECTOMY Bilateral 01/28/2018   Procedure: BILATERAL TOTAL MASTECTOMY;  Surgeon: Excell Seltzer, MD;  Location: [facility];  Service: General;  Laterality: Bilateral;     OB History    Gravida  3   Para      Term      Preterm      AB      Living  3     SAB      TAB      Ectopic      Multiple      Live Births              Family History  Problem Relation Age of  Onset  . Diabetes Mother   . Hypertension Mother   . Depression Mother   . OCD Mother   . Depression Brother   . Cancer Maternal Aunt 25       ovarian   . Depression Maternal Aunt   . Diabetes Maternal Aunt   . Cancer Maternal Grandmother        Lung  . COPD Maternal Grandmother   . Cancer Maternal Grandfather 75       Prostate  . Diabetes Maternal Grandfather   . Hearing loss Maternal Grandfather   . Hypertension Maternal Grandfather   . Stroke Maternal Grandfather   . Cancer Paternal Grandmother 77       Colon  . Diabetes type II Other     Social History   Tobacco Use  . Smoking status: Never Smoker  . Smokeless tobacco: Never Used  Substance Use Topics  . Alcohol use: No  . 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 Authorizing Provider  acetaminophen (TYLENOL) 500 MG tablet Take 1,000 mg by mouth every 6 (six) hours as needed for moderate pain.    [provider]  buPROPion (WELLBUTRIN XL) 150 MG 24 hr tablet Take 150 mg by mouth daily.    [provider]  Calcium Carb-Cholecalciferol (CALCIUM 500 + D) 500-200 MG-UNIT TABS Take 1 tablet by mouth daily.    [provider]  EPINEPHrine (EPI-PEN) 0.3 mg/0.3 mL SOAJ injection Inject 0.3 mg into the muscle once.     [provider]  methocarbamol (ROBAXIN) 500 MG tablet Take 1 tablet (500 mg total) by mouth every 8 (eight) hours as needed for muscle spasms. 05/31/18   Irene Limbo, MD  Multiple Vitamin (MULTIVITAMIN WITH MINERALS) TABS tablet Take 1 tablet by mouth daily. [employer] Multivitamin    [provider]  OnabotulinumtoxinA (BOTOX IJ) Inject 1 each as directed every 3 (three) months. For migraines    [provider]  Plecanatide (TRULANCE) 3 MG TABS Take 3 mg by mouth daily.     [provider]  promethazine (PHENERGAN) 12.5 MG tablet Take 1 tablet (12.5 mg total) by mouth every 6 (six) hours as needed for nausea or vomiting. 05/31/18   Irene Limbo, MD  vitamin B-12 (CYANOCOBALAMIN) 100 MCG tablet Take 100 mcg by mouth daily.    [provider]    Allergies    Ace inhibitors, Atarax [hydroxyzine hcl], Cephalosporins, Codeine, Compazine, Decadron [dexamethasone], Demerol, Glucosamine, Hydroxyzine hcl, Metoclopramide hcl, Morphine, Morphine and related, Ondansetron, Other, Penicillins, Poractant alfa, Pork-derived products, Prozac [fluoxetine hcl], Shellfish-derived products, Stevia glycerite extract [flavoring agent], Sulfa antibiotics, Sulfasalazine, Sumatriptan, Triptans, Zanaflex [tizanidine hcl], Zofran, Pravastatin, Skin adhesives  [cyanoacrylate], Stevioside, Dihydroergotamine, and Periactin [cyproheptadine]  Review of Systems   Review of Systems  Constitutional: Positive for activity change and diaphoresis. Negative for appetite change, chills, fatigue and fever.  HENT: Negative for congestion and rhinorrhea.   Respiratory: Positive for shortness of breath. Negative for cough and wheezing.   Cardiovascular: Positive for chest pain. Negative for palpitations and leg swelling.  Gastrointestinal: Positive for diarrhea and nausea. Negative for abdominal distention, abdominal pain and vomiting.  Genitourinary: Negative for decreased urine volume, difficulty urinating, dysuria, frequency and urgency.  Musculoskeletal: Positive for back pain and neck pain. Negative for gait problem and neck stiffness.  Skin: Negative for  color change and wound.  Neurological: Positive for numbness. Negative for dizziness, syncope, weakness, light-headedness and headaches.  All other systems reviewed and are negative.   Physical Exam Updated Vital Signs BP 96/75 (BP Location: Left Arm)   Pulse 92   Temp 98.6 F (37 C) (Oral)   Resp 16   LMP 03/07/2012   SpO2 98%   Physical Exam Vitals and nursing note reviewed.  Constitutional:      General: She is not in acute distress.    Appearance: Normal appearance. She is obese. She is not ill-appearing or toxic-appearing.  HENT:     Head: Normocephalic.     Right Ear: External ear normal.     Left Ear: External ear normal.     Nose: Nose normal.     Mouth/Throat:     Mouth: Mucous membranes are moist.     Pharynx: Oropharynx is clear.  Eyes:     Extraocular Movements: Extraocular movements intact.  Cardiovascular:     Rate and Rhythm: Normal rate and regular rhythm.     Pulses: Normal pulses.          Radial pulses are 2+ on the right side and 2+ on the left side.       Dorsalis pedis pulses are 2+ on the right side and 2+ on the left side.       Posterior tibial pulses are 2+ on  the right side and 2+ on the left side.     Heart sounds: Normal heart sounds.  Pulmonary:     Effort: Pulmonary effort is normal. No respiratory distress.     Breath sounds: Normal breath sounds. No decreased breath sounds, wheezing or rhonchi.  Chest:     Chest wall: Tenderness (reproducible L anterior chest pain to palpation) present.  Abdominal:     General: Bowel sounds are normal.     Palpations: Abdomen is soft.     Tenderness: There is no abdominal tenderness. There is no guarding.  Musculoskeletal:     Cervical back: Normal range of motion.     Right lower leg: No edema.     Left lower leg: No edema.  Skin:    General: Skin is warm and dry.     Capillary Refill: Capillary refill takes less than 2 seconds.  Neurological:     General: No focal deficit present.     Mental Status: She is alert and oriented to person, place, and time. Mental status is at baseline.  Psychiatric:        Mood and Affect: Mood normal.     ED Results / Procedures / Treatments   Labs (all labs ordered are listed, but only abnormal results are displayed) Labs Reviewed  CBC - Abnormal; Notable for the following components:      Result Value   RBC 5.49 (*)    MCH 23.9 (*)    MCHC 29.6 (*)    RDW 16.0 (*)    All other components within normal limits  BASIC METABOLIC PANEL  I-STAT BETA HCG BLOOD, ED ([facility], [facility], [facility] ONLY)  TROPONIN I (HIGH SENSITIVITY)  TROPONIN I (HIGH SENSITIVITY)    EKG None  Radiology DG Chest 2 View  Result Date: 07/16/2019 CLINICAL DATA:  Acute chest pain and shortness of breath for 2 days. EXAM: CHEST - 2 VIEW COMPARISON:  07/11/2015 FINDINGS: The cardiomediastinal silhouette is unremarkable. There is no evidence of focal airspace disease, pulmonary edema, suspicious pulmonary nodule/mass, pleural effusion, or pneumothorax. No  acute bony abnormalities are identified. IMPRESSION: No active cardiopulmonary disease. Electronically Signed   By: Margarette Canada M.D.   On:  07/16/2019 13:23    Procedures Procedures (including critical care time)  Medications Ordered in ED Medications  sodium chloride flush (NS) 0.9 % injection 3 mL (3 mLs Intravenous Not Given 07/16/19 1553)  lactated ringers bolus 1,000 mL (1,000 mLs Intravenous New Bag/Given 07/16/19 1632)  acetaminophen (TYLENOL) tablet 1,000 mg (1,000 mg Oral Given 07/16/19 1619)  promethazine (PHENERGAN) tablet 12.5 mg (12.5 mg Oral Given 07/16/19 1657)    ED Course  I have reviewed the triage vital signs and the nursing notes.  Pertinent labs & imaging results that were available during my care of the patient were reviewed by me and considered in my medical decision making (see chart for details).    MDM Rules/Calculators/A&P HEAR Score: 18                   44 year old female with a past medical history of diabetes, multiple psychiatric disorders, DJD with chronic low back pain presenting to the department complaining of intermittent left-sided chest pain described as pressure like soreness for the past 3 days with associated shortness of breath, nausea and diaphoresis.  Upon my evaluation patient patient endorses atypical chest pain today.Patient did receive 325 mg ASA prior to arrival therefore I did not provide ASA.  Symptomatic treatment with nitroglycerin was given prior to arrival without improvement. I explicitly expressed my concerns, DDX, and MDM at the patient's bedside.   EKG interpreted by me demonstrated NSR at 94 bpm, R axis deviation, normal intervals, no ST or T wave changes suggestvie of acute ischemia, R axis is new compared to previous otherwise without changes as compared to previous on 11/04/2017  EKG I obtained reveals no anatomical ischemia representing STEMI, New-onset Arrhythmia, or ischemic equivalent. Therefore do not suspect ACS at this time. No concerns for Pericardial Tamponade on EKG and in light of patients hemodynamic stability. No pain related to supine or prone positions  and given EKG doubt Pericarditis.   Per the patient's absence of CAD will send Troponin. First of which is negative, Repeat troponin at 3 hour interval was also negative. Therefore also do not suspect Myocarditis.   CXR interpreted by me demonstrated no acute cardiopulmonary processes.    CXR unremarkable for focal airspace disease, patient is afebrile,  cough, and WBC shows no leukocytosis, do not suspect Pneumonia. Without evidence of Pneumothorax. CXR without concern for Esophageal Tear and there is no recent intractable emesis or esophageal instrumentation. No peritonitis or free air on CXR worrisome for Perforated Abdominal Viscous.  Unlikely Pulmonary Embolism as patient denies estrogen supplementation. Age <50 years. (44 y.o.). Denies malignancy with treatment in last 6 months.No previous Hx of DVT/PE. Patient denies hemoptysis. No unilateral leg swelling observed on exam. Oxygenation saturation has been maintained >95% & HR has been <100 since since arrival to the ED. No recent surgery or trauma to lower extremities or travel involving prolonged car or plane ride. Therefore will not obtain CTA Chest or D-dimer. PERC ru;le negative effectively ruling out PE.  Pain is not described as tearing but does radiate to the back, doubt Aortic Dissection. Pulses present bilaterally in upper and lower extremities. CXR does not show widened mediastinum. Further the patient's initial trop was negative and the pain is waxing and waning/ sporadic in nature.   My H&P and patient's clinical course is not currently consistent with any of the above  emergency medical conditions at this time.   Patient was appropriately risk stratified as HEAR score of 3 and will require continued observation care and subsequent cardiac stress testing for further indications of underlying pathology.  First of serial Troponin is <2, and ECG upon review shows no evidence of active ischemia or infarction  I feel that the patient is  safe and stable for discharge with strict return precautions and a plan for follow-up care in place.  Provided an ambulatory referral to cardiology for further evaluation and consideration of cardiac stress testing.  The plan for this patient was discussed with Dr. Reather Converse, who voiced agreement and who oversaw evaluation and treatment of this patient.   Final Clinical Impression(s) / ED Diagnoses Final diagnoses:  Precordial pain    Rx / DC Orders ED Discharge Orders         Ordered    Ambulatory referral to Cardiology     07/16/19 1640           Filbert Berthold, MD 07/16/19 1722    Elnora Morrison, MD 07/16/19 1728    Elnora Morrison, MD 07/16/19 1729

## 2019-07-16 NOTE — ED Triage Notes (Signed)
Pt to triage via [facility]> reports L sided chest pain x 3 days with SOB, nausea, and diaphoresis.  Pt took ASA and 1 NTG.  Hypotensive after NTG.

## 2019-07-16 NOTE — Discharge Instructions (Addendum)
Today we evaluated you for chest pain with a chest X-ray, blood work looking at your blood counts, electrolytes, kidney function and heart enzymes. All of these were normal. Your EKG was also very reassuring. I would encourage you to continue taking ibuprofen or tylenol for your pain. If you feel that your condition is changing or worsening in any way, please contact your regular doctor or return to the ED for re-evaluation. Please follow up with your regular doctor and cardiologist for further evaluation and consideration of stress testing

## 2019-07-19 DIAGNOSIS — E782 Mixed hyperlipidemia: Secondary | ICD-10-CM | POA: Diagnosis not present

## 2019-07-19 DIAGNOSIS — D509 Iron deficiency anemia, unspecified: Secondary | ICD-10-CM | POA: Diagnosis not present

## 2019-07-19 DIAGNOSIS — L659 Nonscarring hair loss, unspecified: Secondary | ICD-10-CM | POA: Diagnosis not present

## 2019-07-19 DIAGNOSIS — R7303 Prediabetes: Secondary | ICD-10-CM | POA: Diagnosis not present

## 2019-07-21 DIAGNOSIS — Z09 Encounter for follow-up examination after completed treatment for conditions other than malignant neoplasm: Secondary | ICD-10-CM | POA: Diagnosis not present

## 2019-07-21 DIAGNOSIS — R0789 Other chest pain: Secondary | ICD-10-CM | POA: Diagnosis not present

## 2019-07-24 ENCOUNTER — Encounter: Payer: Self-pay | Admitting: Emergency Medicine

## 2019-10-11 DIAGNOSIS — R1013 Epigastric pain: Secondary | ICD-10-CM | POA: Diagnosis not present

## 2019-10-11 DIAGNOSIS — T50905S Adverse effect of unspecified drugs, medicaments and biological substances, sequela: Secondary | ICD-10-CM | POA: Diagnosis not present

## 2019-10-11 DIAGNOSIS — R110 Nausea: Secondary | ICD-10-CM | POA: Diagnosis not present

## 2019-10-13 DIAGNOSIS — L650 Telogen effluvium: Secondary | ICD-10-CM | POA: Diagnosis not present

## 2019-10-13 DIAGNOSIS — L859 Epidermal thickening, unspecified: Secondary | ICD-10-CM | POA: Diagnosis not present

## 2019-10-13 DIAGNOSIS — D225 Melanocytic nevi of trunk: Secondary | ICD-10-CM | POA: Diagnosis not present

## 2019-10-13 DIAGNOSIS — L219 Seborrheic dermatitis, unspecified: Secondary | ICD-10-CM | POA: Diagnosis not present

## 2019-11-10 DIAGNOSIS — G43709 Chronic migraine without aura, not intractable, without status migrainosus: Secondary | ICD-10-CM | POA: Diagnosis not present

## 2019-12-22 DIAGNOSIS — L659 Nonscarring hair loss, unspecified: Secondary | ICD-10-CM | POA: Diagnosis not present

## 2019-12-22 DIAGNOSIS — D509 Iron deficiency anemia, unspecified: Secondary | ICD-10-CM | POA: Diagnosis not present

## 2019-12-22 DIAGNOSIS — E782 Mixed hyperlipidemia: Secondary | ICD-10-CM | POA: Diagnosis not present

## 2019-12-22 DIAGNOSIS — R7303 Prediabetes: Secondary | ICD-10-CM | POA: Diagnosis not present

## 2020-01-03 DIAGNOSIS — F1120 Opioid dependence, uncomplicated: Secondary | ICD-10-CM | POA: Diagnosis not present

## 2020-01-16 DIAGNOSIS — F1120 Opioid dependence, uncomplicated: Secondary | ICD-10-CM | POA: Diagnosis not present

## 2020-01-17 DIAGNOSIS — F1120 Opioid dependence, uncomplicated: Secondary | ICD-10-CM | POA: Diagnosis not present

## 2020-01-23 DIAGNOSIS — D485 Neoplasm of uncertain behavior of skin: Secondary | ICD-10-CM | POA: Diagnosis not present

## 2020-01-23 DIAGNOSIS — L820 Inflamed seborrheic keratosis: Secondary | ICD-10-CM | POA: Diagnosis not present

## 2020-01-23 DIAGNOSIS — L308 Other specified dermatitis: Secondary | ICD-10-CM | POA: Diagnosis not present

## 2020-02-02 DIAGNOSIS — F1120 Opioid dependence, uncomplicated: Secondary | ICD-10-CM | POA: Diagnosis not present

## 2020-02-28 DIAGNOSIS — F1120 Opioid dependence, uncomplicated: Secondary | ICD-10-CM | POA: Diagnosis not present

## 2020-03-08 DIAGNOSIS — F900 Attention-deficit hyperactivity disorder, predominantly inattentive type: Secondary | ICD-10-CM | POA: Diagnosis not present

## 2020-03-08 DIAGNOSIS — E782 Mixed hyperlipidemia: Secondary | ICD-10-CM | POA: Diagnosis not present

## 2020-03-08 DIAGNOSIS — R7303 Prediabetes: Secondary | ICD-10-CM | POA: Diagnosis not present

## 2020-03-08 DIAGNOSIS — E162 Hypoglycemia, unspecified: Secondary | ICD-10-CM | POA: Diagnosis not present

## 2020-03-08 DIAGNOSIS — D509 Iron deficiency anemia, unspecified: Secondary | ICD-10-CM | POA: Diagnosis not present

## 2020-03-13 DIAGNOSIS — F1120 Opioid dependence, uncomplicated: Secondary | ICD-10-CM | POA: Diagnosis not present

## 2020-03-19 DIAGNOSIS — R058 Other specified cough: Secondary | ICD-10-CM | POA: Diagnosis not present

## 2020-03-19 DIAGNOSIS — M791 Myalgia, unspecified site: Secondary | ICD-10-CM | POA: Diagnosis not present

## 2020-03-19 DIAGNOSIS — Z20822 Contact with and (suspected) exposure to covid-19: Secondary | ICD-10-CM | POA: Diagnosis not present

## 2020-03-19 DIAGNOSIS — J029 Acute pharyngitis, unspecified: Secondary | ICD-10-CM | POA: Diagnosis not present

## 2020-03-19 DIAGNOSIS — R509 Fever, unspecified: Secondary | ICD-10-CM | POA: Diagnosis not present

## 2020-03-21 DIAGNOSIS — G43709 Chronic migraine without aura, not intractable, without status migrainosus: Secondary | ICD-10-CM | POA: Diagnosis not present

## 2020-03-27 DIAGNOSIS — F1120 Opioid dependence, uncomplicated: Secondary | ICD-10-CM | POA: Diagnosis not present

## 2020-04-01 DIAGNOSIS — Z6833 Body mass index (BMI) 33.0-33.9, adult: Secondary | ICD-10-CM | POA: Diagnosis not present

## 2020-04-01 DIAGNOSIS — Z01419 Encounter for gynecological examination (general) (routine) without abnormal findings: Secondary | ICD-10-CM | POA: Diagnosis not present

## 2020-04-10 DIAGNOSIS — F1120 Opioid dependence, uncomplicated: Secondary | ICD-10-CM | POA: Diagnosis not present

## 2020-04-24 DIAGNOSIS — F1120 Opioid dependence, uncomplicated: Secondary | ICD-10-CM | POA: Diagnosis not present

## 2020-05-08 DIAGNOSIS — F1120 Opioid dependence, uncomplicated: Secondary | ICD-10-CM | POA: Diagnosis not present

## 2020-05-22 DIAGNOSIS — F1120 Opioid dependence, uncomplicated: Secondary | ICD-10-CM | POA: Diagnosis not present

## 2020-06-03 DIAGNOSIS — F1120 Opioid dependence, uncomplicated: Secondary | ICD-10-CM | POA: Diagnosis not present

## 2020-06-19 DIAGNOSIS — F1120 Opioid dependence, uncomplicated: Secondary | ICD-10-CM | POA: Diagnosis not present

## 2020-06-28 DIAGNOSIS — I10 Essential (primary) hypertension: Secondary | ICD-10-CM | POA: Diagnosis not present

## 2020-06-28 DIAGNOSIS — R7303 Prediabetes: Secondary | ICD-10-CM | POA: Diagnosis not present

## 2020-06-28 DIAGNOSIS — F331 Major depressive disorder, recurrent, moderate: Secondary | ICD-10-CM | POA: Diagnosis not present

## 2020-06-28 DIAGNOSIS — G43409 Hemiplegic migraine, not intractable, without status migrainosus: Secondary | ICD-10-CM | POA: Diagnosis not present

## 2020-07-03 DIAGNOSIS — F1120 Opioid dependence, uncomplicated: Secondary | ICD-10-CM | POA: Diagnosis not present

## 2020-07-26 DIAGNOSIS — R7303 Prediabetes: Secondary | ICD-10-CM | POA: Diagnosis not present

## 2020-07-26 DIAGNOSIS — F331 Major depressive disorder, recurrent, moderate: Secondary | ICD-10-CM | POA: Diagnosis not present

## 2020-07-26 DIAGNOSIS — G43409 Hemiplegic migraine, not intractable, without status migrainosus: Secondary | ICD-10-CM | POA: Diagnosis not present

## 2020-07-26 DIAGNOSIS — I10 Essential (primary) hypertension: Secondary | ICD-10-CM | POA: Diagnosis not present

## 2020-07-31 DIAGNOSIS — F1120 Opioid dependence, uncomplicated: Secondary | ICD-10-CM | POA: Diagnosis not present

## 2020-08-08 DIAGNOSIS — I889 Nonspecific lymphadenitis, unspecified: Secondary | ICD-10-CM | POA: Diagnosis not present

## 2020-08-12 DIAGNOSIS — G43709 Chronic migraine without aura, not intractable, without status migrainosus: Secondary | ICD-10-CM | POA: Diagnosis not present

## 2020-08-14 DIAGNOSIS — F1120 Opioid dependence, uncomplicated: Secondary | ICD-10-CM | POA: Diagnosis not present

## 2020-08-28 DIAGNOSIS — F1120 Opioid dependence, uncomplicated: Secondary | ICD-10-CM | POA: Diagnosis not present

## 2020-09-06 DIAGNOSIS — R0981 Nasal congestion: Secondary | ICD-10-CM | POA: Diagnosis not present

## 2020-09-06 DIAGNOSIS — J029 Acute pharyngitis, unspecified: Secondary | ICD-10-CM | POA: Diagnosis not present

## 2020-09-06 DIAGNOSIS — R059 Cough, unspecified: Secondary | ICD-10-CM | POA: Diagnosis not present

## 2020-09-06 DIAGNOSIS — Z03818 Encounter for observation for suspected exposure to other biological agents ruled out: Secondary | ICD-10-CM | POA: Diagnosis not present

## 2020-09-06 DIAGNOSIS — R6883 Chills (without fever): Secondary | ICD-10-CM | POA: Diagnosis not present

## 2020-09-11 DIAGNOSIS — F1120 Opioid dependence, uncomplicated: Secondary | ICD-10-CM | POA: Diagnosis not present

## 2020-09-25 DIAGNOSIS — F1120 Opioid dependence, uncomplicated: Secondary | ICD-10-CM | POA: Diagnosis not present

## 2020-10-09 DIAGNOSIS — F1120 Opioid dependence, uncomplicated: Secondary | ICD-10-CM | POA: Diagnosis not present

## 2020-10-22 DIAGNOSIS — L578 Other skin changes due to chronic exposure to nonionizing radiation: Secondary | ICD-10-CM | POA: Diagnosis not present

## 2020-10-22 DIAGNOSIS — R110 Nausea: Secondary | ICD-10-CM | POA: Diagnosis not present

## 2020-10-22 DIAGNOSIS — L219 Seborrheic dermatitis, unspecified: Secondary | ICD-10-CM | POA: Diagnosis not present

## 2020-10-22 DIAGNOSIS — L910 Hypertrophic scar: Secondary | ICD-10-CM | POA: Diagnosis not present

## 2020-10-22 DIAGNOSIS — D485 Neoplasm of uncertain behavior of skin: Secondary | ICD-10-CM | POA: Diagnosis not present

## 2020-10-22 DIAGNOSIS — L03112 Cellulitis of left axilla: Secondary | ICD-10-CM | POA: Diagnosis not present

## 2020-10-22 DIAGNOSIS — L821 Other seborrheic keratosis: Secondary | ICD-10-CM | POA: Diagnosis not present

## 2020-10-22 DIAGNOSIS — D2271 Melanocytic nevi of right lower limb, including hip: Secondary | ICD-10-CM | POA: Diagnosis not present

## 2020-10-23 DIAGNOSIS — F1120 Opioid dependence, uncomplicated: Secondary | ICD-10-CM | POA: Diagnosis not present

## 2020-11-06 ENCOUNTER — Other Ambulatory Visit: Payer: Self-pay | Admitting: Plastic Surgery

## 2020-11-06 DIAGNOSIS — M79622 Pain in left upper arm: Secondary | ICD-10-CM

## 2020-11-06 DIAGNOSIS — Z1502 Genetic susceptibility to malignant neoplasm of ovary: Secondary | ICD-10-CM | POA: Diagnosis not present

## 2020-11-06 DIAGNOSIS — R590 Localized enlarged lymph nodes: Secondary | ICD-10-CM | POA: Diagnosis not present

## 2020-11-06 DIAGNOSIS — F1120 Opioid dependence, uncomplicated: Secondary | ICD-10-CM | POA: Diagnosis not present

## 2020-11-06 DIAGNOSIS — R1013 Epigastric pain: Secondary | ICD-10-CM | POA: Diagnosis not present

## 2020-11-06 DIAGNOSIS — Z9013 Acquired absence of bilateral breasts and nipples: Secondary | ICD-10-CM | POA: Diagnosis not present

## 2020-11-06 DIAGNOSIS — F988 Other specified behavioral and emotional disorders with onset usually occurring in childhood and adolescence: Secondary | ICD-10-CM | POA: Diagnosis not present

## 2020-11-06 DIAGNOSIS — Z1501 Genetic susceptibility to malignant neoplasm of breast: Secondary | ICD-10-CM | POA: Diagnosis not present

## 2020-11-06 DIAGNOSIS — Z1509 Genetic susceptibility to other malignant neoplasm: Secondary | ICD-10-CM | POA: Diagnosis not present

## 2020-11-06 DIAGNOSIS — F4329 Adjustment disorder with other symptoms: Secondary | ICD-10-CM | POA: Diagnosis not present

## 2020-11-08 ENCOUNTER — Other Ambulatory Visit: Payer: Self-pay | Admitting: Plastic Surgery

## 2020-11-08 ENCOUNTER — Other Ambulatory Visit: Payer: Self-pay

## 2020-11-08 ENCOUNTER — Ambulatory Visit
Admission: RE | Admit: 2020-11-08 | Discharge: 2020-11-08 | Disposition: A | Discharge status: Home / Self Care | Payer: BC Managed Care – PPO | Source: Ambulatory Visit | Attending: Plastic Surgery | Admitting: Plastic Surgery

## 2020-11-08 DIAGNOSIS — M79622 Pain in left upper arm: Secondary | ICD-10-CM

## 2020-11-08 DIAGNOSIS — N644 Mastodynia: Secondary | ICD-10-CM | POA: Diagnosis not present

## 2020-11-12 ENCOUNTER — Other Ambulatory Visit: Payer: BC Managed Care – PPO

## 2020-11-19 DIAGNOSIS — F1120 Opioid dependence, uncomplicated: Secondary | ICD-10-CM | POA: Diagnosis not present

## 2020-11-20 DIAGNOSIS — R051 Acute cough: Secondary | ICD-10-CM | POA: Diagnosis not present

## 2020-12-04 DIAGNOSIS — F1120 Opioid dependence, uncomplicated: Secondary | ICD-10-CM | POA: Diagnosis not present

## 2020-12-18 DIAGNOSIS — F1120 Opioid dependence, uncomplicated: Secondary | ICD-10-CM | POA: Diagnosis not present

## 2020-12-25 DIAGNOSIS — M545 Low back pain, unspecified: Secondary | ICD-10-CM | POA: Diagnosis not present

## 2020-12-25 DIAGNOSIS — S301XXA Contusion of abdominal wall, initial encounter: Secondary | ICD-10-CM | POA: Diagnosis not present

## 2020-12-25 DIAGNOSIS — S8001XA Contusion of right knee, initial encounter: Secondary | ICD-10-CM | POA: Diagnosis not present

## 2020-12-25 DIAGNOSIS — S5012XA Contusion of left forearm, initial encounter: Secondary | ICD-10-CM | POA: Diagnosis not present

## 2020-12-25 DIAGNOSIS — R110 Nausea: Secondary | ICD-10-CM | POA: Diagnosis not present

## 2020-12-27 DIAGNOSIS — S300XXD Contusion of lower back and pelvis, subsequent encounter: Secondary | ICD-10-CM | POA: Diagnosis not present

## 2020-12-27 DIAGNOSIS — S8001XD Contusion of right knee, subsequent encounter: Secondary | ICD-10-CM | POA: Diagnosis not present

## 2020-12-31 DIAGNOSIS — F1120 Opioid dependence, uncomplicated: Secondary | ICD-10-CM | POA: Diagnosis not present

## 2021-01-02 DIAGNOSIS — E162 Hypoglycemia, unspecified: Secondary | ICD-10-CM | POA: Diagnosis not present

## 2021-01-02 DIAGNOSIS — R7303 Prediabetes: Secondary | ICD-10-CM | POA: Diagnosis not present

## 2021-01-02 DIAGNOSIS — I10 Essential (primary) hypertension: Secondary | ICD-10-CM | POA: Diagnosis not present

## 2021-01-09 DIAGNOSIS — Z9884 Bariatric surgery status: Secondary | ICD-10-CM | POA: Diagnosis not present

## 2021-01-09 DIAGNOSIS — R7303 Prediabetes: Secondary | ICD-10-CM | POA: Diagnosis not present

## 2021-01-09 DIAGNOSIS — E162 Hypoglycemia, unspecified: Secondary | ICD-10-CM | POA: Diagnosis not present

## 2021-01-29 DIAGNOSIS — F1120 Opioid dependence, uncomplicated: Secondary | ICD-10-CM | POA: Diagnosis not present

## 2021-02-06 ENCOUNTER — Ambulatory Visit (INDEPENDENT_AMBULATORY_CARE_PROVIDER_SITE_OTHER): Payer: BC Managed Care – PPO | Admitting: Endocrinology

## 2021-02-06 ENCOUNTER — Other Ambulatory Visit: Payer: Self-pay

## 2021-02-06 VITALS — BP 126/82 | HR 79 | Ht 66.0 in | Wt 218.6 lb

## 2021-02-06 DIAGNOSIS — Z794 Long term (current) use of insulin: Secondary | ICD-10-CM | POA: Diagnosis not present

## 2021-02-06 DIAGNOSIS — E1169 Type 2 diabetes mellitus with other specified complication: Secondary | ICD-10-CM | POA: Diagnosis not present

## 2021-02-06 LAB — POCT GLYCOSYLATED HEMOGLOBIN (HGB A1C): Hemoglobin A1C: 5.9 % — AB (ref 4.0–5.6)

## 2021-02-06 MED ORDER — METFORMIN HCL ER 500 MG PO TB24: 500.0000 mg | ORAL_TABLET | Freq: Every day | ORAL | 11 refills | Status: AC

## 2021-02-06 NOTE — Progress Notes (Signed)
Subjective:    Patient ID: [name], female    DOB: 1975-10-24, 45 y.o.   MRN: [id number]  HPI Pt is referred by Dr Jacelyn Grip, for hypoglycemia.She had gastric bypass in 2013.  Prior to that, she took metformin and 2 insulins.  Since then, she has lost 67 lbs.  She says after surgery, she developed intermittent hypoglycemia approx 2 hrs PC, approx QOD.  She reports sxs of back pain, sweating, and tremor.  She has checked cbg, and it has been as low as 29.  Symptoms are usually not relieved by eating or drinking.  She takes no medication for the blood sugar.  She has never had alcohol intake, adrenal problems, pituitary problems, liver problems, or kidney problems.  She has noted cbg as high as 271.  She says hypoglycemia does not correlate with weight.  She had TAH.    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, lst migraine 7/16 13 in ER see EPIC   Retrolisthesis    L4 and L5    Past Surgical History:  Procedure Laterality Date   ABDOMINAL HYSTERECTOMY     BOWEL RESECTION  11/30/2011   Procedure: SMALL BOWEL RESECTION;  Surgeon: Edward Jolly, MD;  Location: [facility];  Service: General;;   BREAST RECONSTRUCTION WITH PLACEMENT OF TISSUE EXPANDER AND ALLODERM Bilateral 01/28/2018   Procedure: BILATERAL BREAST RECONSTRUCTION WITH PLACEMENT OF TISSUE EXPANDER AND ALLODERM;  Surgeon: Irene Limbo, MD;  Location: [place];  Service: Plastics;  Laterality: Bilateral;   BREATH TEK H PYLORI  09/11/2011   Procedure: BREATH TEK H PYLORI;  Surgeon: Edward Jolly, MD;  Location: [facility];  Service: General;  Laterality: N/A;   [place] N/A 11/11/2017   Procedure: CYSTOSCOPY;  Surgeon: Arvella Nigh, MD;  Location: [facility];  Service: Gynecology;  Laterality: N/A;   ESOPHAGOGASTRODUODENOSCOPY  03/14/2012   Procedure: ESOPHAGOGASTRODUODENOSCOPY (EGD);  Surgeon: Shann Medal, MD;  Location: [facility];  Service: General;  Laterality: N/A;   ESOPHAGOGASTRODUODENOSCOPY  04/25/2012   Procedure: ESOPHAGOGASTRODUODENOSCOPY (EGD);  Surgeon: Shann Medal, MD;  Location: [facility];  Service: General;  Laterality: N/A;   ESOPHAGOGASTRODUODENOSCOPY N/A 03/31/2013   Procedure: ESOPHAGOGASTRODUODENOSCOPY (EGD);  Surgeon: Shann Medal, MD;  Location: [facility];  Service: General;  Laterality: N/A;   ESOPHAGOGASTRODUODENOSCOPY N/A 04/09/2014   Procedure: ESOPHAGOGASTRODUODENOSCOPY (EGD);  Surgeon: Alphonsa Overall, MD;  Location: [facility];  Service: General;  Laterality: N/A;   GASTRIC ROUX-EN-Y  11/30/2011   Procedure: LAPAROSCOPIC ROUX-EN-Y GASTRIC;  Surgeon: Edward Jolly, MD;  Location: [facility];  Service: General;  Laterality: N/A;   HYSTERECTOMY ABDOMINAL WITH SALPINGO-OOPHORECTOMY     LAPAROSCOPIC VAGINAL HYSTERECTOMY WITH SALPINGO OOPHORECTOMY Bilateral 11/11/2017   Procedure: LAPAROSCOPIC ASSISTED VAGINAL HYSTERECTOMY WITH SALPINGO OOPHORECTOMY;  Surgeon: Arvella Nigh, MD;  Location: [place];  Service: Gynecology;  Laterality: Bilateral;   laporoscopy  2007   LIPOSUCTION WITH LIPOFILLING Bilateral 05/31/2018   Procedure: LIPOFILLING BILATERAL CHEST;  Surgeon:  Irene Limbo, MD;  Location: [place];  Service: Plastics;  Laterality: Bilateral;   REMOVAL OF BILATERAL TISSUE EXPANDERS WITH PLACEMENT OF BILATERAL BREAST IMPLANTS Bilateral 05/31/2018   Procedure: REMOVAL OF BILATERAL TISSUE EXPANDERS WITH PLACEMENT OF BILATERAL BREAST IMPLANTS;  Surgeon: Irene Limbo, MD;  Location: [place];  Service: Plastics;  Laterality: Bilateral;    TONSILLECTOMY     TOTAL MASTECTOMY Bilateral 01/28/2018    BILATERAL TOTAL MASTECTOMY (Bilateral Breast)   TOTAL MASTECTOMY Bilateral 01/28/2018   Procedure: BILATERAL TOTAL MASTECTOMY;  Surgeon: Excell Seltzer, MD;  Location: [place];  Service: General;  Laterality: Bilateral;    Social History   Socioeconomic History   Marital status: Married    Spouse name: Not on file   Number of children: Not on file   Years of education: Not on file   Highest education level: Not on file  Occupational History   Not on file  Tobacco Use   Smoking status: Never   Smokeless tobacco: Never  Vaping Use   Vaping Use: Never used  Substance and Sexual Activity   Alcohol use: No   Drug use: No    Comment: h/x of dependance of RX meds 5 years clean   Sexual activity: Not Currently    Birth control/protection: Surgical  Other Topics Concern   Not on file  Social History Narrative   Not on file   Social Determinants of Health   Financial Resource Strain: Not on file  Food Insecurity: Not on file  Transportation Needs: Not on file  Physical Activity: Not on file  Stress: Not on file  Social Connections: Not on file  Intimate Partner Violence: Not on file    Current Outpatient Medications on File Prior to Visit  Medication Sig Dispense Refill   acetaminophen (TYLENOL) 500 MG tablet Take 1,000 mg by mouth every 6 (six) hours as needed for moderate pain.     buPROPion (WELLBUTRIN XL) 150 MG 24 hr tablet Take 150 mg by mouth daily.     Calcium Carb-Cholecalciferol 500-200 MG-UNIT TABS Take 1 tablet by mouth daily.     EPINEPHrine (EPI-PEN) 0.3 mg/0.3 mL SOAJ injection Inject 0.3 mg into the muscle once.     methocarbamol (ROBAXIN) 500 MG tablet Take 1 tablet (500 mg total) by mouth every 8 (eight) hours as needed for muscle spasms. 30 tablet 0   Multiple Vitamin (MULTIVITAMIN WITH MINERALS) TABS tablet Take 1 tablet by mouth daily. Alive W[employer] Multivitamin     OnabotulinumtoxinA  (BOTOX IJ) Inject 1 each as directed every 3 (three) months. For migraines     Plecanatide (TRULANCE) 3 MG TABS Take 3 mg by mouth daily.      promethazine (PHENERGAN) 12.5 MG tablet Take 1 tablet (12.5 mg total) by mouth every 6 (six) hours as needed for nausea or vomiting. 30 tablet 0   vitamin B-12 (CYANOCOBALAMIN) 100 MCG tablet Take 100 mcg by mouth daily.     No current facility-administered medications on file prior to visit.    Allergies  Allergen Reactions   Ace Inhibitors Other (See Comments)    Spaces out and triggers migraines   Atarax [Hydroxyzine Hcl] Anaphylaxis   Cephalosporins Anaphylaxis   Codeine Anaphylaxis and Rash    Tolerates Dilaudid and Percocet. Tolerates Dilaudid and Percocet. Tolerates Dilaudid and Percocet.   Compazine Anaphylaxis    Tolerates Phenergan.   Decadron [Dexamethasone] Anaphylaxis   Demerol Anaphylaxis   Glucosamine     Other reaction(s):  Other (See Comments) Religous reasons Religous reasons   Hydroxyzine Hcl Anaphylaxis   Metoclopramide Hcl Anaphylaxis   Morphine Anaphylaxis    Tolerates Dilaudid and Percocet.   Morphine And Related Anaphylaxis    Tolerates Dilaudid and Percocet.    Ondansetron Anaphylaxis   Other Anaphylaxis and Other (See Comments)    ALL STEROIDS CAUSES ULCERS; also to DERMABOND  Tolerates Promethazine    Penicillins Anaphylaxis    PATIENT HAS HAD A PCN REACTION WITH IMMEDIATE RASH, FACIAL/TONGUE/THROAT SWELLING, SOB, OR LIGHTHEADEDNESS WITH HYPOTENSION:  #  #  YES  #  #  Has patient had a PCN reaction causing severe rash involving mucus membranes or skin necrosis: unknown Has patient had a PCN reaction that required hospitalization: unknown Has patient had a PCN reaction occurring within the last 10 years: NO    Poractant Alfa Other (See Comments)    Religous reasons--per pt ok to take Heparin when medically necessary    Pork-Derived Products Other (See Comments)    Religous reasons--per pt ok to take  Heparin when medically necessary   Prozac [Fluoxetine Hcl] Other (See Comments)    Pt reports severe negative mood altering after taking.    Shellfish-Derived Products Other (See Comments)    Religous reasons   Stevia Glycerite Extract [Flavoring Agent] Swelling    Tongue swelling   Sulfa Antibiotics Anaphylaxis   Sulfasalazine Anaphylaxis   Sumatriptan Anaphylaxis    Causes migraine    Triptans Anaphylaxis    Makes migraines worse also   Zanaflex [Tizanidine Hcl] Other (See Comments)    Loss of balance, lightheadedness, spaces out   Zofran Anaphylaxis   Pravastatin Other (See Comments)    Leg cramps   Skin Adhesives [Cyanoacrylate] Rash    Dermabond, skin peels   Stevioside Swelling   Dihydroergotamine Other (See Comments)    unknown   Periactin [Cyproheptadine] Rash    Family History  Problem Relation Age of Onset   Diabetes Mother    Hypertension Mother    Depression Mother    OCD Mother    Depression Brother    Cancer Maternal Aunt 25       ovarian    Depression Maternal Aunt    Diabetes Maternal Aunt    Cancer Maternal Grandmother        Lung   COPD Maternal Grandmother    Cancer Maternal Grandfather 75       Prostate   Diabetes Maternal Grandfather    Hearing loss Maternal Grandfather    Hypertension Maternal Grandfather    Stroke Maternal Grandfather    Cancer Paternal Grandmother 88       Colon   Diabetes type II Other     BP 126/82 (BP Location: Right Arm, Patient Position: Sitting, Cuff Size: Normal)   Pulse 79   Ht $R'5\' 6"'jS$  (1.676 m)   Wt 218 lb 9.6 oz (99.2 kg)   LMP 03/07/2012   SpO2 97%   BMI 35.28 kg/m     Review of Systems denies headache, palpitations, skin rash, n/v, seizure, diarrhea, or LOC.      Objective:   Physical Exam VS: see vs page GEN: no distress HEAD: head: no deformity eyes: no periorbital swelling, no proptosis external nose and ears are normal NECK: supple, thyroid is not enlarged CHEST WALL: no deformity LUNGS:  clear to auscultation CV: reg rate and rhythm, no murmur.  MUSCULOSKELETAL: gait is normal and steady.   NEURO:  readily moves all 4's.  sensation is intact  to touch on all 4's SKIN:  Normal texture and temperature.  No rash or suspicious lesion is visible.   NODES:  None palpable at the neck PSYCH: alert, well-oriented.  Does not appear anxious nor depressed.     Lab Results  Component Value Date   HGBA1C 5.9 (A) 02/06/2021   I have reviewed outside records, and summarized: Pt was noted to have elevated A1c, and referred here.  She has received advice from several dieticians.      Assessment & Plan:  Type 2 DM, complicated by reactive hypoglycemia. Uncontrolled.  We discussed rx options.  We'll try metformin first   Patient Instructions  I have sent a prescription to your pharmacy, for metformin. Please call or message [place] in 2 weeks, to tell us how the blood sugar is doing.  Please come back for a follow-up appointment in 3 months.

## 2021-02-06 NOTE — Patient Instructions (Signed)
I have sent a prescription to your pharmacy, for metformin. Please call or message [place] in 2 weeks, to tell us how the blood sugar is doing.  Please come back for a follow-up appointment in 3 months.

## 2021-02-09 ENCOUNTER — Encounter: Payer: Self-pay | Admitting: Endocrinology

## 2021-02-10 ENCOUNTER — Other Ambulatory Visit: Payer: Self-pay | Admitting: Endocrinology

## 2021-02-10 MED ORDER — OZEMPIC (0.25 OR 0.5 MG/DOSE) 2 MG/1.5ML ~~LOC~~ SOPN: 0.2500 mg | PEN_INJECTOR | SUBCUTANEOUS | 3 refills | Status: DC

## 2021-02-11 ENCOUNTER — Telehealth: Payer: Self-pay | Admitting: Endocrinology

## 2021-02-11 ENCOUNTER — Other Ambulatory Visit: Payer: Self-pay | Admitting: Endocrinology

## 2021-02-11 MED ORDER — TRULICITY 0.75 MG/0.5ML ~~LOC~~ SOAJ: 0.7500 mg | SUBCUTANEOUS | 3 refills | Status: DC

## 2021-02-11 NOTE — Telephone Encounter (Signed)
Patient notified and will pick up medication  

## 2021-02-11 NOTE — Telephone Encounter (Signed)
Patient is calling to find out if she can be given samples of Ozempic because the pharmacy is telling that it is on back order everywhere. Semaglutide,0.25 or 0.5MG /DOS, (OZEMPIC, 0.25 OR 0.5 MG/DOSE,) 2 MG/1.5ML SOPN **Patient is leaving at 2:00 AM to go out of town tomorrow morning and would like an answer today.

## 2021-02-12 ENCOUNTER — Other Ambulatory Visit: Payer: Self-pay

## 2021-02-12 ENCOUNTER — Encounter: Payer: Self-pay | Admitting: Endocrinology

## 2021-02-12 MED ORDER — DEXCOM G6 RECEIVER DEVI: 0 refills | Status: AC

## 2021-02-12 MED ORDER — DEXCOM G6 SENSOR MISC: 3 refills | Status: DC

## 2021-02-12 MED ORDER — DEXCOM G6 TRANSMITTER MISC: 3 refills | Status: AC

## 2021-02-12 NOTE — Telephone Encounter (Signed)
Script sent per patient request

## 2021-02-18 ENCOUNTER — Encounter: Payer: Self-pay | Admitting: Endocrinology

## 2021-02-19 DIAGNOSIS — F1120 Opioid dependence, uncomplicated: Secondary | ICD-10-CM | POA: Diagnosis not present

## 2021-02-20 ENCOUNTER — Other Ambulatory Visit: Payer: Self-pay | Admitting: Endocrinology

## 2021-02-20 MED ORDER — FREESTYLE LIBRE 2 SENSOR MISC: 1.0000 | 3 refills | Status: DC

## 2021-02-21 ENCOUNTER — Other Ambulatory Visit: Payer: Self-pay

## 2021-02-21 DIAGNOSIS — E0869 Diabetes mellitus due to underlying condition with other specified complication: Secondary | ICD-10-CM

## 2021-02-21 DIAGNOSIS — Z794 Long term (current) use of insulin: Secondary | ICD-10-CM

## 2021-02-21 MED ORDER — FREESTYLE LIBRE 3 SENSOR MISC: 1.0000 | Freq: Every day | 3 refills | Status: AC

## 2021-02-25 ENCOUNTER — Other Ambulatory Visit: Payer: Self-pay | Admitting: Endocrinology

## 2021-02-25 DIAGNOSIS — E162 Hypoglycemia, unspecified: Secondary | ICD-10-CM

## 2021-02-28 ENCOUNTER — Other Ambulatory Visit: Payer: Self-pay

## 2021-02-28 ENCOUNTER — Other Ambulatory Visit (INDEPENDENT_AMBULATORY_CARE_PROVIDER_SITE_OTHER): Payer: BC Managed Care – PPO

## 2021-02-28 DIAGNOSIS — E162 Hypoglycemia, unspecified: Secondary | ICD-10-CM | POA: Diagnosis not present

## 2021-03-01 LAB — GLUCOSE, FASTING: Glucose, Bld: 83 mg/dL (ref 65–99)

## 2021-03-03 ENCOUNTER — Encounter: Payer: Self-pay | Admitting: Endocrinology

## 2021-03-03 LAB — C-PEPTIDE: C-Peptide: 1.63 ng/mL (ref 0.80–3.85)

## 2021-03-03 LAB — INSULIN, RANDOM: Insulin: 4.9 u[IU]/mL

## 2021-03-12 ENCOUNTER — Encounter: Payer: Self-pay | Admitting: Endocrinology

## 2021-03-12 DIAGNOSIS — F1120 Opioid dependence, uncomplicated: Secondary | ICD-10-CM | POA: Diagnosis not present

## 2021-03-13 ENCOUNTER — Other Ambulatory Visit: Payer: Self-pay | Admitting: Endocrinology

## 2021-03-13 MED ORDER — OZEMPIC (0.25 OR 0.5 MG/DOSE) 2 MG/1.5ML ~~LOC~~ SOPN: 0.5000 mg | PEN_INJECTOR | SUBCUTANEOUS | 3 refills | Status: AC

## 2021-03-26 DIAGNOSIS — R059 Cough, unspecified: Secondary | ICD-10-CM | POA: Diagnosis not present

## 2021-03-26 DIAGNOSIS — U071 COVID-19: Secondary | ICD-10-CM | POA: Diagnosis not present

## 2021-03-26 DIAGNOSIS — Z20822 Contact with and (suspected) exposure to covid-19: Secondary | ICD-10-CM | POA: Diagnosis not present

## 2021-03-26 DIAGNOSIS — G43409 Hemiplegic migraine, not intractable, without status migrainosus: Secondary | ICD-10-CM | POA: Diagnosis not present

## 2021-03-26 DIAGNOSIS — G43709 Chronic migraine without aura, not intractable, without status migrainosus: Secondary | ICD-10-CM | POA: Diagnosis not present

## 2021-03-28 DIAGNOSIS — S01511A Laceration without foreign body of lip, initial encounter: Secondary | ICD-10-CM | POA: Diagnosis not present

## 2021-04-03 DIAGNOSIS — K911 Postgastric surgery syndromes: Secondary | ICD-10-CM | POA: Diagnosis not present

## 2021-04-03 DIAGNOSIS — E162 Hypoglycemia, unspecified: Secondary | ICD-10-CM | POA: Diagnosis not present

## 2021-04-03 DIAGNOSIS — E1169 Type 2 diabetes mellitus with other specified complication: Secondary | ICD-10-CM | POA: Diagnosis not present

## 2021-04-03 DIAGNOSIS — Z9884 Bariatric surgery status: Secondary | ICD-10-CM | POA: Diagnosis not present

## 2021-04-09 DIAGNOSIS — F1120 Opioid dependence, uncomplicated: Secondary | ICD-10-CM | POA: Diagnosis not present

## 2021-04-21 DIAGNOSIS — Z6832 Body mass index (BMI) 32.0-32.9, adult: Secondary | ICD-10-CM | POA: Diagnosis not present

## 2021-04-21 DIAGNOSIS — Z01419 Encounter for gynecological examination (general) (routine) without abnormal findings: Secondary | ICD-10-CM | POA: Diagnosis not present

## 2021-04-24 ENCOUNTER — Ambulatory Visit: Payer: BC Managed Care – PPO | Admitting: Endocrinology

## 2021-04-29 ENCOUNTER — Emergency Department (HOSPITAL_COMMUNITY): Payer: BC Managed Care – PPO

## 2021-04-29 ENCOUNTER — Other Ambulatory Visit: Payer: Self-pay

## 2021-04-29 ENCOUNTER — Observation Stay (HOSPITAL_COMMUNITY)
Admission: EM | Admit: 2021-04-29 | Discharge: 2021-04-30 | Discharge status: Against Medical Advice | Payer: BC Managed Care – PPO | Attending: Internal Medicine | Admitting: Internal Medicine

## 2021-04-29 DIAGNOSIS — Z794 Long term (current) use of insulin: Secondary | ICD-10-CM | POA: Diagnosis not present

## 2021-04-29 DIAGNOSIS — E119 Type 2 diabetes mellitus without complications: Secondary | ICD-10-CM | POA: Diagnosis not present

## 2021-04-29 DIAGNOSIS — R410 Disorientation, unspecified: Secondary | ICD-10-CM | POA: Diagnosis not present

## 2021-04-29 DIAGNOSIS — R4781 Slurred speech: Secondary | ICD-10-CM | POA: Diagnosis not present

## 2021-04-29 DIAGNOSIS — Z79899 Other long term (current) drug therapy: Secondary | ICD-10-CM | POA: Insufficient documentation

## 2021-04-29 DIAGNOSIS — W07XXXA Fall from chair, initial encounter: Secondary | ICD-10-CM | POA: Insufficient documentation

## 2021-04-29 DIAGNOSIS — Z7984 Long term (current) use of oral hypoglycemic drugs: Secondary | ICD-10-CM | POA: Diagnosis not present

## 2021-04-29 DIAGNOSIS — M62838 Other muscle spasm: Secondary | ICD-10-CM

## 2021-04-29 DIAGNOSIS — R29818 Other symptoms and signs involving the nervous system: Secondary | ICD-10-CM | POA: Diagnosis not present

## 2021-04-29 DIAGNOSIS — Y990 Civilian activity done for income or pay: Secondary | ICD-10-CM | POA: Insufficient documentation

## 2021-04-29 DIAGNOSIS — R519 Headache, unspecified: Secondary | ICD-10-CM | POA: Diagnosis not present

## 2021-04-29 DIAGNOSIS — F4323 Adjustment disorder with mixed anxiety and depressed mood: Secondary | ICD-10-CM | POA: Diagnosis present

## 2021-04-29 DIAGNOSIS — R531 Weakness: Principal | ICD-10-CM | POA: Insufficient documentation

## 2021-04-29 DIAGNOSIS — Z20822 Contact with and (suspected) exposure to covid-19: Secondary | ICD-10-CM | POA: Insufficient documentation

## 2021-04-29 DIAGNOSIS — Z8616 Personal history of COVID-19: Secondary | ICD-10-CM | POA: Diagnosis not present

## 2021-04-29 DIAGNOSIS — R4701 Aphasia: Secondary | ICD-10-CM | POA: Diagnosis not present

## 2021-04-29 DIAGNOSIS — R29810 Facial weakness: Secondary | ICD-10-CM | POA: Diagnosis not present

## 2021-04-29 LAB — URINALYSIS, ROUTINE W REFLEX MICROSCOPIC
Bilirubin Urine: NEGATIVE
Glucose, UA: NEGATIVE mg/dL
Hgb urine dipstick: NEGATIVE
Ketones, ur: NEGATIVE mg/dL
Leukocytes,Ua: NEGATIVE
Nitrite: NEGATIVE
Protein, ur: NEGATIVE mg/dL
Specific Gravity, Urine: 1.025 (ref 1.005–1.030)
pH: 6 (ref 5.0–8.0)

## 2021-04-29 LAB — COMPREHENSIVE METABOLIC PANEL
ALT: 19 U/L (ref 0–44)
AST: 22 U/L (ref 15–41)
Albumin: 4.2 g/dL (ref 3.5–5.0)
Alkaline Phosphatase: 61 U/L (ref 38–126)
Anion gap: 11 (ref 5–15)
BUN: 11 mg/dL (ref 6–20)
CO2: 24 mmol/L (ref 22–32)
Calcium: 9.2 mg/dL (ref 8.9–10.3)
Chloride: 105 mmol/L (ref 98–111)
Creatinine, Ser: 0.9 mg/dL (ref 0.44–1.00)
GFR, Estimated: >60 mL/min (ref >60)
Glucose, Bld: 87 mg/dL (ref 70–99)
Potassium: 4.2 mmol/L (ref 3.5–5.1)
Sodium: 140 mmol/L (ref 135–145)
Total Bilirubin: 0.3 mg/dL (ref 0.3–1.2)
Total Protein: 6.5 g/dL (ref 6.5–8.1)

## 2021-04-29 LAB — DIFFERENTIAL
Abs Immature Granulocytes: 0.01 10*3/uL (ref 0.00–0.07)
Basophils Absolute: 0.1 10*3/uL (ref 0.0–0.1)
Basophils Relative: 1 %
Eosinophils Absolute: 0.1 10*3/uL (ref 0.0–0.5)
Eosinophils Relative: 2 %
Immature Granulocytes: 0 %
Lymphocytes Relative: 36 %
Lymphs Abs: 2.3 10*3/uL (ref 0.7–4.0)
Monocytes Absolute: 0.5 10*3/uL (ref 0.1–1.0)
Monocytes Relative: 7 %
Neutro Abs: 3.5 10*3/uL (ref 1.7–7.7)
Neutrophils Relative %: 54 %

## 2021-04-29 LAB — CBC
HCT: 40.1 % (ref 36.0–46.0)
Hemoglobin: 12.8 g/dL (ref 12.0–15.0)
MCH: 26.4 pg (ref 26.0–34.0)
MCHC: 31.9 g/dL (ref 30.0–36.0)
MCV: 82.9 fL (ref 80.0–100.0)
Platelets: 234 10*3/uL (ref 150–400)
RBC: 4.84 MIL/uL (ref 3.87–5.11)
RDW: 14.7 % (ref 11.5–15.5)
WBC: 6.4 10*3/uL (ref 4.0–10.5)
nRBC: 0 % (ref 0.0–0.2)

## 2021-04-29 LAB — OSMOLALITY, URINE: Osmolality, Ur: 327 mOsm/kg (ref 300–900)

## 2021-04-29 LAB — CBG MONITORING, ED
Glucose-Capillary: 87 mg/dL (ref 70–99)
Glucose-Capillary: 160 mg/dL — ABNORMAL HIGH (ref 70–99)

## 2021-04-29 LAB — RAPID URINE DRUG SCREEN, HOSP PERFORMED
Amphetamines: POSITIVE — AB
Barbiturates: NOT DETECTED
Benzodiazepines: NOT DETECTED
Cocaine: NOT DETECTED
Opiates: NOT DETECTED
Tetrahydrocannabinol: NOT DETECTED

## 2021-04-29 LAB — I-STAT CHEM 8, ED
BUN: 11 mg/dL (ref 6–20)
Calcium, Ion: 1.08 mmol/L — ABNORMAL LOW (ref 1.15–1.40)
Chloride: 101 mmol/L (ref 98–111)
Creatinine, Ser: 0.9 mg/dL (ref 0.44–1.00)
Glucose, Bld: 86 mg/dL (ref 70–99)
HCT: 40 % (ref 36.0–46.0)
Hemoglobin: 13.6 g/dL (ref 12.0–15.0)
Potassium: 4.1 mmol/L (ref 3.5–5.1)
Sodium: 141 mmol/L (ref 135–145)
TCO2: 26 mmol/L (ref 22–32)

## 2021-04-29 LAB — PROTIME-INR
INR: 1.1 (ref 0.8–1.2)
Prothrombin Time: 14.1 seconds (ref 11.4–15.2)

## 2021-04-29 LAB — I-STAT BETA HCG BLOOD, ED (MC, WL, AP ONLY): I-stat hCG, quantitative: <5 m[IU]/mL (ref <5)

## 2021-04-29 LAB — RESP PANEL BY RT-PCR (FLU A&B, COVID) ARPGX2
Influenza A by PCR: NEGATIVE
Influenza B by PCR: NEGATIVE
SARS Coronavirus 2 by RT PCR: NEGATIVE

## 2021-04-29 LAB — APTT: aPTT: 30 seconds (ref 24–36)

## 2021-04-29 LAB — SODIUM, URINE, RANDOM: Sodium, Ur: 56 mmol/L

## 2021-04-29 LAB — CREATININE, URINE, RANDOM: Creatinine, Urine: 52.45 mg/dL

## 2021-04-29 LAB — ETHANOL: Alcohol, Ethyl (B): <10 mg/dL (ref <10)

## 2021-04-29 MED ORDER — METHOCARBAMOL 1000 MG/10ML IJ SOLN
500.0000 mg | Freq: Once | INTRAVENOUS | Status: AC
  Administered 2021-04-29: 500 mg via INTRAVENOUS
  Filled 2021-04-29: qty 500

## 2021-04-29 MED ORDER — KETOROLAC TROMETHAMINE 30 MG/ML IJ SOLN
30.0000 mg | Freq: Once | INTRAMUSCULAR | Status: AC
  Administered 2021-04-29: 30 mg via INTRAVENOUS
  Filled 2021-04-29: qty 1

## 2021-04-29 MED ORDER — IOHEXOL 350 MG/ML SOLN
65.0000 mL | Freq: Once | INTRAVENOUS | Status: AC | PRN
  Administered 2021-04-29: 65 mL via INTRAVENOUS

## 2021-04-29 MED ORDER — HYDROMORPHONE HCL 1 MG/ML IJ SOLN: 1.0000 mg | Freq: Once | INTRAMUSCULAR | Status: DC

## 2021-04-29 MED ORDER — ACETAMINOPHEN 650 MG RE SUPP: 650.0000 mg | Freq: Four times a day (QID) | RECTAL | Status: DC | PRN

## 2021-04-29 MED ORDER — ACETAMINOPHEN 325 MG PO TABS: 650.0000 mg | ORAL_TABLET | Freq: Four times a day (QID) | ORAL | Status: DC | PRN

## 2021-04-29 MED ORDER — SODIUM CHLORIDE 0.9 % IV SOLN
25.0000 mg | Freq: Four times a day (QID) | INTRAVENOUS | Status: DC | PRN
  Administered 2021-04-29: 25 mg via INTRAVENOUS
  Filled 2021-04-29 (×2): qty 1

## 2021-04-29 MED ORDER — METHOCARBAMOL 1000 MG/10ML IJ SOLN
500.0000 mg | Freq: Once | INTRAMUSCULAR | Status: DC
Start: 2021-04-29 — End: 2021-04-29

## 2021-04-29 MED ORDER — INSULIN ASPART 100 UNIT/ML IJ SOLN: 0.0000 [IU] | INTRAMUSCULAR | Status: DC

## 2021-04-29 MED ORDER — SODIUM CHLORIDE 0.9 % IV SOLN
75.0000 mL/h | INTRAVENOUS | Status: DC
  Administered 2021-04-29: 75 mL/h via INTRAVENOUS

## 2021-04-29 NOTE — Assessment & Plan Note (Signed)
restart home meds when able to tolerate

## 2021-04-29 NOTE — Subjective & Objective (Signed)
13:32 today pt had an episode today when she fell out of the chair at work fell lightheaded She was lightheaded put her head down, could not remember her work  number Does not smoke or drink has hx of conversion disorder last episode was 7 years ago No fever no chills

## 2021-04-29 NOTE — Code Documentation (Signed)
Stroke Response Nurse Documentation Code Documentation  KATHERYNE GORR is a 46 y.o. female arriving to [facility] ED via [facility] on 04/29/21 with past medical hx of Migraine, anxiety/depression. On No antithrombotic. Code stroke was activated by EMS.   Patient from work where she was LKW at 20. Patient developed sudden garbled speech and unable to move left side of body.   Stroke team at the bedside on patient arrival. Labs drawn and patient cleared for CT by Dr. Eulis Foster. Patient to CT with team. NIHSS 14, see documentation for details and code stroke times. Patient with disoriented, left facial droop, left arm weakness, left leg weakness, left decreased sensation, Expressive aphasia , and dysarthria  on exam.   The following imaging was completed:  CT, CTA head and neck. Patient is not a candidate for IV Thrombolytic due to negative MRI. Patient is not a candidate for IR due to no LVO.   Care/Plan: MRI, plan per EDP.   Bedside handoff with ED RN [place].    Candace Cruise K  Stroke Response RN

## 2021-04-29 NOTE — Progress Notes (Signed)
Visited with patient that was having stroke symptoms.  Provided emotional support. Patient gone for scan. Chaplain will follow as needed.  Jaclynn Major, [place], [facility], Pager [phone]
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
## 2021-04-29 NOTE — ED Provider Notes (Signed)
[place] EMERGENCY DEPARTMENT Provider Note   CSN: [id number] Arrival date & time: 04/29/21  1416  An emergency department physician performed an initial assessment on this suspected stroke patient at 20.  History  Chief Complaint  Patient presents with   Code Stroke    [name] is a 46 y.o. female. Patient initially seen by me as [facility]. HPI Patient presents for evaluation of left-sided weakness and garbled speech.     Home Medications Prior to Admission medications   Medication Sig Start Date End Date Taking? Authorizing Provider  acetaminophen (TYLENOL) 500 MG tablet Take 1,000 mg by mouth every 6 (six) hours as needed for moderate pain.    [provider]  buPROPion (WELLBUTRIN XL) 150 MG 24 hr tablet Take 150 mg by mouth daily.    [provider]  Calcium Carb-Cholecalciferol 500-200 MG-UNIT TABS Take 1 tablet by mouth daily.    [provider]  Continuous Blood Gluc Receiver (DEXCOM G6 RECEIVER) DEVI Use a directed 02/12/21   Shamleffer, Melanie Crazier, MD  Continuous Blood Gluc Sensor (FREESTYLE LIBRE 3 SENSOR) MISC 1 each by Does not apply route daily. Place 1 sensor on the skin every 14 days. Use to check glucose continuously 02/21/21   Renato Shin, MD  Continuous Blood Gluc Transmit (DEXCOM G6 TRANSMITTER) MISC 1 every 90 days 02/12/21   Shamleffer, Melanie Crazier, MD  EPINEPHrine (EPI-PEN) 0.3 mg/0.3 mL SOAJ injection Inject 0.3 mg into the muscle once.    [provider]  metFORMIN (GLUCOPHAGE-XR) 500 MG 24 hr tablet Take 1 tablet (500 mg total) by mouth daily. 02/06/21   Renato Shin, MD  methocarbamol (ROBAXIN) 500 MG tablet Take 1 tablet (500 mg total) by mouth every 8 (eight) hours as needed for muscle spasms. 05/31/18   Irene Limbo, MD  Multiple Vitamin (MULTIVITAMIN WITH MINERALS) TABS tablet Take 1 tablet by mouth daily. [employer] Multivitamin    [provider]  OnabotulinumtoxinA (BOTOX IJ) Inject 1 each as directed every 3 (three) months. For migraines    [provider]  Plecanatide (TRULANCE) 3 MG TABS Take 3 mg by mouth daily.     [provider]  promethazine (PHENERGAN) 12.5 MG tablet Take 1 tablet (12.5 mg total) by mouth every 6 (six) hours as needed for nausea or vomiting. 05/31/18   Irene Limbo, MD  Semaglutide,0.25 or 0.5MG /DOS, (OZEMPIC, 0.25 OR 0.5 MG/DOSE,) 2 MG/1.5ML SOPN Inject 0.5 mg into the skin once a week. 03/13/21   Renato Shin, MD  vitamin B-12 (CYANOCOBALAMIN) 100 MCG tablet Take 100 mcg by mouth daily.    [provider]      Allergies    Ace inhibitors, Atarax [hydroxyzine hcl], Cephalosporins, Codeine, Compazine, Decadron [dexamethasone], Demerol, Glucosamine, Hydroxyzine hcl, Metoclopramide hcl, Morphine, Morphine and related, Ondansetron, Other, Penicillins, Poractant alfa, Pork-derived products, Prozac [fluoxetine hcl], Shellfish-derived products, Stevia glycerite extract [flavoring agent], Sulfa antibiotics, Sulfasalazine, Sumatriptan, Triptans, Zanaflex [tizanidine hcl], Zofran, Pravastatin, Skin adhesives [cyanoacrylate], Stevioside, Dihydroergotamine, and Periactin [cyproheptadine]    Review of Systems   Review of Systems  Physical Exam Updated Vital Signs BP 132/90 (BP Location: Right Arm)    Pulse 87    Temp 98.4 F (36.9 C) (Oral)    Resp 18    Ht 5\' 6"  (1.676 m)    Wt 91.2 kg    LMP 03/07/2012    SpO2 95%    BMI 32.45 kg/m  Physical Exam Vitals  and nursing note reviewed.  Constitutional:      General: She is in acute distress.     Appearance: She is well-developed. She is not ill-appearing or diaphoretic.  HENT:     Head: Normocephalic and atraumatic.     Right Ear: External ear normal.     Left Ear: External ear normal.  Eyes:     Conjunctiva/sclera: Conjunctivae normal.     Pupils: Pupils are equal, round, and reactive to light.  Neck:     Trachea:  Phonation normal.  Cardiovascular:     Rate and Rhythm: Normal rate and regular rhythm.     Heart sounds: Normal heart sounds.  Pulmonary:     Effort: Pulmonary effort is normal. No respiratory distress.     Breath sounds: Normal breath sounds. No stridor.  Chest:     Chest wall: No tenderness.  Abdominal:     General: There is no distension.     Palpations: Abdomen is soft.     Tenderness: There is no abdominal tenderness.  Musculoskeletal:        General: Normal range of motion.     Cervical back: Normal range of motion and neck supple.  Skin:    General: Skin is warm and dry.  Neurological:     Mental Status: She is alert.     Cranial Nerves: No cranial nerve deficit.     Motor: No abnormal muscle tone.     Coordination: Coordination normal.     Comments: Waxing and waning limited movement of the toes and left foot.  Left arm held flexed at the elbow, firmly against her chest wall, and resistance of any movement from this position.  No evidence for facial asymmetry.  Speech is garbled, and noted to occasionally be more intelligible as she talks in a very frustrated manner.  She gesticulates, and is able to respond to questions using hand movements with the right hand.  Right leg movement is normal.  She does not display signs for receptive or expressive aphasia.  Psychiatric:        Mood and Affect: Mood normal.        Behavior: Behavior normal.    ED Results / Procedures / Treatments   Labs (all labs ordered are listed, but only abnormal results are displayed) Labs Reviewed  I-STAT CHEM 8, ED - Abnormal; Notable for the following components:      Result Value   Calcium, Ion 1.08 (*)    All other components within normal limits  RESP PANEL BY RT-PCR (FLU A&B, COVID) ARPGX2  PROTIME-INR  APTT  CBC  DIFFERENTIAL  ETHANOL  COMPREHENSIVE METABOLIC PANEL  RAPID URINE DRUG SCREEN, HOSP PERFORMED  URINALYSIS, ROUTINE W REFLEX MICROSCOPIC  CBG MONITORING, ED  I-STAT BETA HCG  BLOOD, ED ([facility], [facility], [facility] ONLY)    EKG None  Radiology MR BRAIN WO CONTRAST  Result Date: 04/29/2021 CLINICAL DATA:  Neuro deficit, acute, stroke suspected; left-sided facial droop and weakness EXAM: MRI HEAD WITHOUT CONTRAST TECHNIQUE: Multiplanar, multiecho pulse sequences of the brain and surrounding structures were obtained without intravenous contrast. COMPARISON:  None. FINDINGS: Motion artifact is present. Brain: There is no acute infarction or intracranial hemorrhage. There is no intracranial mass, mass effect, or edema. There is no hydrocephalus or extra-axial fluid collection. Ventricles and sulci are normal in size and configuration. Vascular: Major vessel flow voids at the skull base are preserved. Skull and upper cervical spine: Normal marrow signal is preserved. Sinuses/Orbits: Paranasal sinuses are  aerated. Orbits are unremarkable. Other: Sella is unremarkable.  Mastoid air cells are clear. IMPRESSION: No acute infarct, hemorrhage, or mass. Electronically Signed   By: Macy Mis M.D.   On: 04/29/2021 15:21   CT HEAD CODE STROKE WO CONTRAST  Result Date: 04/29/2021 CLINICAL DATA:  Code stroke. Neuro deficit, acute, stroke suspected. EXAM: CT ANGIOGRAPHY HEAD AND NECK TECHNIQUE: Multidetector CT imaging of the head and neck was performed using the standard protocol during bolus administration of intravenous contrast. Multiplanar CT image reconstructions and MIPs were obtained to evaluate the vascular anatomy. Carotid stenosis measurements (when applicable) are obtained utilizing NASCET criteria, using the distal internal carotid diameter as the denominator. RADIATION DOSE REDUCTION: This exam was performed according to the departmental dose-optimization program which includes automated exposure control, adjustment of the mA and/or kV according to patient size and/or use of iterative reconstruction technique. CONTRAST:  58mL OMNIPAQUE IOHEXOL 350 MG/ML SOLN COMPARISON:  None. Brain MRI  07/20/2013.  Head CT 07/20/2013. FINDINGS: CT HEAD FINDINGS Brain: Cerebral volume is normal. There is no acute intracranial hemorrhage. No demarcated cortical infarct. No extra-axial fluid collection. No evidence of an intracranial mass. No midline shift. Vascular: No hyperdense vessel. Skull: Normal. Negative for fracture or focal lesion. Sinuses/Orbits: Visualized orbits show no acute finding. Small-volume frothy secretions within the right maxillary sinus. ASPECTS ([facility] Stroke Program Early CT Score) - Ganglionic level infarction (caudate, lentiform nuclei, internal capsule, insula, M1-M3 cortex): 7 - Supraganglionic infarction (M4-M6 cortex): 3 Total score (0-10 with 10 being normal): 10 These results were called by telephone at the time of interpretation on 04/29/2021 at 2:45 pm to provider Dr. Reeves Forth, who verbally acknowledged these results. CTA NECK FINDINGS Aortic arch: Standard aortic branching. The visualized aortic arch is normal in caliber. Streak and beam hardening artifact arising a dense left-sided contrast bolus partially obscures the left subclavian artery. Within this limitation, there is no appreciable hemodynamically significant stenosis within the innominate or proximal subclavian arteries. Right carotid system: CCA and ICA patent within the neck without stenosis or significant atherosclerotic disease. Tortuosity of the cervical ICA. Left carotid system: CCA and ICA patent within the neck without stenosis or significant atherosclerotic disease. Tortuosity of the cervical ICA. Vertebral arteries: Vertebral arteries codominant and patent within the neck without stenosis. Skeleton: Cervical spondylosis. No acute bony abnormality or aggressive osseous lesion. Other neck: Thyroid nodules, the largest within the left thyroid lobe measuring 1.7 cm (series 6, image 76). Upper chest: No consolidation within the imaged lung apices. Mild linear atelectasis within the partially imaged right lower lobe.  Review of the MIP images confirms the above findings CTA HEAD FINDINGS Anterior circulation: The intracranial internal carotid arteries are patent. The M1 middle cerebral arteries are patent. Mild irregularity of the M2 and more distal MCA vessels, bilaterally. No M2 proximal branch occlusion or high-grade proximal stenosis is identified. The anterior cerebral arteries are patent. No intracranial aneurysm is identified. Posterior circulation: The intracranial vertebral arteries are patent. The basilar artery is patent. The posterior cerebral arteries are patent. The right PCA is fetal in origin. The left posterior communicating artery is diminutive or absent. Venous sinuses: Within the limitations of contrast timing, no convincing thrombus. Anatomic variants: As described. Review of the MIP images confirms the above findings No emergent large vessel occlusion identified. These results were called by telephone at the time of interpretation on 04/29/2021 at 2:50 pm to provider Dr. Reeves Forth, who verbally acknowledged these results. IMPRESSION: CT head: 1. No evidence of acute intracranial  abnormality. 2. Mild right maxillary sinusitis. CTA neck: 1. The common carotid, internal carotid and vertebral arteries are patent within the neck without stenosis or significant atherosclerotic disease. 2. Multiple thyroid nodules, the largest measuring 1.7 cm within left thyroid lobe. A non-emergent thyroid ultrasound is recommended for further evaluation. 3. Mild linear atelectasis versus pulmonary scarring within the partially imaged right lower lobe. CTA head: 1. No intracranial large vessel occlusion or proximal high-grade arterial stenosis. 2. Mild irregularity of the M2 and more distal MCA vessels bilaterally, likely due to atherosclerotic disease. Electronically Signed   By: Kellie Simmering D.O.   On: 04/29/2021 15:11   CT ANGIO HEAD NECK W WO CM (CODE STROKE)  Result Date: 04/29/2021 CLINICAL DATA:  Code stroke. Neuro deficit,  acute, stroke suspected. EXAM: CT ANGIOGRAPHY HEAD AND NECK TECHNIQUE: Multidetector CT imaging of the head and neck was performed using the standard protocol during bolus administration of intravenous contrast. Multiplanar CT image reconstructions and MIPs were obtained to evaluate the vascular anatomy. Carotid stenosis measurements (when applicable) are obtained utilizing NASCET criteria, using the distal internal carotid diameter as the denominator. RADIATION DOSE REDUCTION: This exam was performed according to the departmental dose-optimization program which includes automated exposure control, adjustment of the mA and/or kV according to patient size and/or use of iterative reconstruction technique. CONTRAST:  69mL OMNIPAQUE IOHEXOL 350 MG/ML SOLN COMPARISON:  None. Brain MRI 07/20/2013.  Head CT 07/20/2013. FINDINGS: CT HEAD FINDINGS Brain: Cerebral volume is normal. There is no acute intracranial hemorrhage. No demarcated cortical infarct. No extra-axial fluid collection. No evidence of an intracranial mass. No midline shift. Vascular: No hyperdense vessel. Skull: Normal. Negative for fracture or focal lesion. Sinuses/Orbits: Visualized orbits show no acute finding. Small-volume frothy secretions within the right maxillary sinus. ASPECTS ([place] [facility] Stroke Program Early CT Score) - Ganglionic level infarction (caudate, lentiform nuclei, internal capsule, insula, M1-M3 cortex): 7 - Supraganglionic infarction (M4-M6 cortex): 3 Total score (0-10 with 10 being normal): 10 These results were called by telephone at the time of interpretation on 04/29/2021 at 2:45 pm to provider Dr. Reeves Forth, who verbally acknowledged these results. CTA NECK FINDINGS Aortic arch: Standard aortic branching. The visualized aortic arch is normal in caliber. Streak and beam hardening artifact arising a dense left-sided contrast bolus partially obscures the left subclavian artery. Within this limitation, there is no appreciable hemodynamically  significant stenosis within the innominate or proximal subclavian arteries. Right carotid system: CCA and ICA patent within the neck without stenosis or significant atherosclerotic disease. Tortuosity of the cervical ICA. Left carotid system: CCA and ICA patent within the neck without stenosis or significant atherosclerotic disease. Tortuosity of the cervical ICA. Vertebral arteries: Vertebral arteries codominant and patent within the neck without stenosis. Skeleton: Cervical spondylosis. No acute bony abnormality or aggressive osseous lesion. Other neck: Thyroid nodules, the largest within the left thyroid lobe measuring 1.7 cm (series 6, image 76). Upper chest: No consolidation within the imaged lung apices. Mild linear atelectasis within the partially imaged right lower lobe. Review of the MIP images confirms the above findings CTA HEAD FINDINGS Anterior circulation: The intracranial internal carotid arteries are patent. The M1 middle cerebral arteries are patent. Mild irregularity of the M2 and more distal MCA vessels, bilaterally. No M2 proximal branch occlusion or high-grade proximal stenosis is identified. The anterior cerebral arteries are patent. No intracranial aneurysm is identified. Posterior circulation: The intracranial vertebral arteries are patent. The basilar artery is patent. The posterior cerebral arteries are patent. The right PCA is  fetal in origin. The left posterior communicating artery is diminutive or absent. Venous sinuses: Within the limitations of contrast timing, no convincing thrombus. Anatomic variants: As described. Review of the MIP images confirms the above findings No emergent large vessel occlusion identified. These results were called by telephone at the time of interpretation on 04/29/2021 at 2:50 pm to provider Dr. Reeves Forth, who verbally acknowledged these results. IMPRESSION: CT head: 1. No evidence of acute intracranial abnormality. 2. Mild right maxillary sinusitis. CTA neck: 1.  The common carotid, internal carotid and vertebral arteries are patent within the neck without stenosis or significant atherosclerotic disease. 2. Multiple thyroid nodules, the largest measuring 1.7 cm within left thyroid lobe. A non-emergent thyroid ultrasound is recommended for further evaluation. 3. Mild linear atelectasis versus pulmonary scarring within the partially imaged right lower lobe. CTA head: 1. No intracranial large vessel occlusion or proximal high-grade arterial stenosis. 2. Mild irregularity of the M2 and more distal MCA vessels bilaterally, likely due to atherosclerotic disease. Electronically Signed   By: Kellie Simmering D.O.   On: 04/29/2021 15:11    Procedures Procedures    Medications Ordered in ED Medications  ketorolac (TORADOL) 30 MG/ML injection 30 mg (has no administration in time range)  promethazine (PHENERGAN) 25 mg in sodium chloride 0.9 % 50 mL IVPB (has no administration in time range)  HYDROmorphone (DILAUDID) injection 1 mg (has no administration in time range)  iohexol (OMNIPAQUE) 350 MG/ML injection 65 mL (65 mLs Intravenous Contrast Given 04/29/21 1440)    ED Course/ Medical Decision Making/ A&P Clinical Course as of 04/29/21 1537  Tue Apr 29, 2021  1512 Patient has had imaging: CT head, CTA head and neck, MRI brain-all negative.  Case discussed with neuro hospitalist at the bedside, code stroke canceled.  He will write a consult.  He recommends "migraine cocktail." [EW]    Clinical Course User Index [EW] Daleen Bo, MD                           Medical Decision Making Patient presents as code stroke, by EMS, called in the field.  On arrival the patient was not in respiratory distress.  She had garbled speech and difficulty moving her left arm and leg  Problems Addressed: Left-sided weakness: undiagnosed new problem with uncertain prognosis    Details: Speech abnormality and weakness concerning for right brain stroke. Nonintractable headache,  unspecified chronicity pattern, unspecified headache type: undiagnosed new problem with uncertain prognosis    Details: Possibly causing symptoms or side effect from stroke.  Amount and/or Complexity of Data Reviewed Independent Historian:     Details: Even though she cannot talk, the patient is able to cooperate with history and answers every question.  Her answers are through a combination of garbled speech as well as hand motions to simple yes and no questions. External Data Reviewed: notes.    Details: Office notes, allergy profile, prior medications that she could tolerate all reviewed in the EMR. Labs: ordered.    Details: CBC, metabolic panel, viral panel Radiology: ordered.    Details: CT head, CT angio head and neck, MRI brain-no CVA or bleeding.  No large vessel occlusion. Discussion of management or test interpretation with external provider(s): 1.  Discussion with neuro hospitalist at time of entry, and after his consultation imaging complete 2.  Discussion with pharmacist regarding appropriate and efficacious "migraine cocktail," medications in light of her numerous allergies.  Risk Prescription drug management. Decision  regarding hospitalization. Risk Details: Patient presenting with symptoms concerning for acute stroke.  Initial evaluation does not reveal stroke.  She did report a headache and she was given a migraine cocktail, initially with Phenergan and Toradol to help control her symptoms.  Critical Care Total time providing critical care: 30-74 minutes          Final Clinical Impression(s) / ED Diagnoses Final diagnoses:  Left-sided weakness  Nonintractable headache, unspecified chronicity pattern, unspecified headache type    Rx / DC Orders ED Discharge Orders     None         Daleen Bo, MD 04/30/21 1158

## 2021-04-29 NOTE — H&P (Signed)
ANSHIKA PETHTEL JSH:[id number] DOB: 09/05/1975 DOA: 04/29/2021     PCP: Vernie Shanks, MD   Outpatient Specialists:  Endocrinology Dr. Garnet Koyanagi [facility]  [facility]    Patient arrived to ER on 04/29/21 at 1416 Referred by Attending Toy Baker, MD   Patient coming from:    home Lives  With family    Chief Complaint:   Chief Complaint  Patient presents with   Code Stroke    HPI: MAKENIZE MESSMAN is a 46 y.o. female with medical history significant of DM2 anxiety, depression, migraine headaches with history of hemiplegic migraines affecting the left side  Presented with   left side weakness 13:32 today pt had an episode today when she fell out of the chair at work fell lightheaded She was lightheaded put her head down, could not remember her work  number Does not smoke or drink has hx of conversion disorder last episode was 7 years ago No fever no chills      Had covid about 6 wks ago     Initial COVID TEST  NEGATIVE   Lab Results  Component Value Date   Kelly NEGATIVE 04/29/2021     Regarding pertinent Chronic problems:       DM 2 -  Lab Results  Component Value Date   HGBA1C 5.9 (A) 02/06/2021   PO meds only ozempic   obesity-   BMI Readings from Last 1 Encounters:  04/29/21 32.45 kg/m       While in ER: Clinical Course as of 04/29/21 1956  Tue Apr 29, 2021  1512 Patient has had imaging: CT head, CTA head and neck, MRI brain-all negative.  Case discussed with neuro hospitalist at the bedside, code stroke canceled.  He will write a consult.  He recommends "migraine cocktail." [EW]    Clinical Course User Index [EW] Daleen Bo, MD     Ordered MRI No acute infarct, hemorrhage, or mass. CT A HEAD  and Neck - 1. No intracranial large vessel occlusion or proximal high-grade arterial stenosis. 2. Mild irregularity of the M2 and more distal MCA vessels bilaterally, likely due to atherosclerotic disease. 1. The common carotid, internal  carotid and vertebral arteries are patent within the neck without stenosis or significant atherosclerotic disease. 2. Multiple thyroid nodules, the largest measuring 1.7 cm within left thyroid lobe. A non-emergent thyroid ultrasound is recommended for further evaluation. 3. Mild linear atelectasis versus pulmonary scarring within the partially imaged right lower lobe.     Following Medications were ordered in ER: Medications  promethazine (PHENERGAN) 25 mg in sodium chloride 0.9 % 50 mL IVPB (0 mg Intravenous Stopped 04/29/21 1639)  HYDROmorphone (DILAUDID) injection 1 mg (1 mg Intravenous Patient Refused/Not Given 04/29/21 1546)  methocarbamol (ROBAXIN) 500 mg in dextrose 5 % 50 mL IVPB (has no administration in time range)  iohexol (OMNIPAQUE) 350 MG/ML injection 65 mL (65 mLs Intravenous Contrast Given 04/29/21 1440)  ketorolac (TORADOL) 30 MG/ML injection 30 mg (30 mg Intravenous Given 04/29/21 1619)    _______________________________________________________ ER Provider Called:  Neurology   Dr.Palikh, Dorna Mai  They Recommend admit to medicine   Will see in AM    ED Triage Vitals  Enc Vitals Group     BP 04/29/21 1430 (!) 129/99     Pulse Rate 04/29/21 1519 87     Resp 04/29/21 1519 18     Temp 04/29/21 1519 98.4 F (36.9 C)     Temp Source 04/29/21 1519 Oral  SpO2 04/29/21 1519 95 %     Weight 04/29/21 1423 201 lb 1 oz (91.2 kg)     Height 04/29/21 1514 $RemoveBefor'5\' 6"'YIkuUuMkaSLy$  (1.676 m)     Head Circumference --      Peak Flow --      Pain Score --      Pain Loc --      Pain Edu? --      Excl. in [place]? --   TMAX(24)@     _________________________________________ Significant initial  Findings: Abnormal Labs Reviewed  I-STAT CHEM 8, ED - Abnormal; Notable for the following components:      Result Value   Calcium, Ion 1.08 (*)    All other components within normal limits     ECG: Ordered Personally reviewed by me showing: HR : 93 Rhythm:  NSR,   no evidence of ischemic changes QTC  479   ____________   The recent clinical data is shown below. Vitals:   04/29/21 1730 04/29/21 1800 04/29/21 1830 04/29/21 1902  BP: 116/70 113/72 110/76 119/79  Pulse: 84 79 79 76  Resp: $Remo'15 16 17 18  'iegmf$ Temp:      TempSrc:      SpO2: 98% 98% 99% 98%  Weight:      Height:         WBC     Component Value Date/Time   WBC 6.4 04/29/2021 1427   LYMPHSABS 2.3 04/29/2021 1427   MONOABS 0.5 04/29/2021 1427   EOSABS 0.1 04/29/2021 1427   BASOSABS 0.1 04/29/2021 1427       UA  ordered     Results for orders placed or performed during the hospital encounter of 04/29/21  Resp Panel by RT-PCR (Flu A&B, Covid) Nasopharyngeal Swab     Status: None   Collection Time: 04/29/21  2:20 PM   Specimen: Nasopharyngeal Swab; Nasopharyngeal(NP) swabs in vial transport medium  Result Value Ref Range Status   SARS Coronavirus 2 by RT PCR NEGATIVE NEGATIVE Final         Influenza A by PCR NEGATIVE NEGATIVE Final   Influenza B by PCR NEGATIVE NEGATIVE Final         __________________________________________ Hospitalist was called for admission for inability to walk due to LEft side tightness  The following Work up has been ordered so far:  Orders Placed This Encounter  Procedures   Resp Panel by RT-PCR (Flu A&B, Covid) Nasopharyngeal Swab   CT HEAD CODE STROKE WO CONTRAST   CT ANGIO HEAD NECK W WO CM (CODE STROKE)   MR BRAIN WO CONTRAST   Ethanol   Protime-INR   APTT   CBC   Differential   Comprehensive metabolic panel   Urine rapid drug screen (hosp performed)   Urinalysis, Routine w reflex microscopic   Diet NPO time specified   Vital signs   Cardiac Monitoring   Modified Stroke Scale (mNIHSS) Document mNIHSS assessment every 2 hours for a total of 12 hours   Stroke swallow screen   Initiate Carrier Fluid Protocol   If O2 sat If O2 Sat < 94%, administer O2 at 2 liters/minute via nasal cannula.   Cardiac monitoring   Clerk to Activate Code Stroke   Consult to hospitalist    Pulse oximetry, continuous   CBG monitoring, ED   I-stat chem 8, ED   I-Stat beta hCG blood, ED   ED EKG   EKG 12-Lead   Saline lock IV   Place in observation (patient's expected length  of stay will be less than 2 midnights)     OTHER Significant initial  Findings:  labs showing:    Recent Labs  Lab 04/29/21 1426 04/29/21 1427  NA 141 140  K 4.1 4.2  CO2  --  24  GLUCOSE 86 87  BUN 11 11  CREATININE 0.90 0.90  CALCIUM  --  9.2    Cr   stable,   Lab Results  Component Value Date   CREATININE 0.90 04/29/2021   CREATININE 0.90 04/29/2021   CREATININE 0.70 07/16/2019    Recent Labs  Lab 04/29/21 1427  AST 22  ALT 19  ALKPHOS 61  BILITOT 0.3  PROT 6.5  ALBUMIN 4.2   Lab Results  Component Value Date   CALCIUM 9.2 04/29/2021   PHOS 3.6 12/20/2010          Plt: Lab Results  Component Value Date   PLT 234 04/29/2021       COVID-19 Labs  No results for input(s): DDIMER, FERRITIN, LDH, CRP in the last 72 hours.  Lab Results  Component Value Date   SARSCOV2NAA NEGATIVE 04/29/2021    Recent Labs  Lab 04/29/21 1426 04/29/21 1427  WBC  --  6.4  NEUTROABS  --  3.5  HGB 13.6 12.8  HCT 40.0 40.1  MCV  --  82.9  PLT  --  234    HG/HCT  stable,     Component Value Date/Time   HGB 12.8 04/29/2021 1427   HCT 40.1 04/29/2021 1427   MCV 82.9 04/29/2021 1427      DM  labs:  HbA1C: Recent Labs    02/06/21 0755  HGBA1C 5.9*       CBG (last 3)  Recent Labs    04/29/21 1419  GLUCAP 87          Cultures:    Component Value Date/Time   SDES  11/27/2017 2005    URINE, CLEAN CATCH Performed at [facility], [address], [place], [place] [address]    [facility]  11/27/2017 2005    NONE Performed at [facility], [address] [place], [place] [address]    CULT (A) 11/27/2017 2005    <10,000 COLONIES/mL INSIGNIFICANT GROWTH Performed at [facility] [address], [place],  [address]    REPTSTATUS  11/29/2017 FINAL 11/27/2017 2005     Radiological Exams on Admission: MR BRAIN WO CONTRAST  Result Date: 04/29/2021 CLINICAL DATA:  Neuro deficit, acute, stroke suspected; left-sided facial droop and weakness EXAM: MRI HEAD WITHOUT CONTRAST TECHNIQUE: Multiplanar, multiecho pulse sequences of the brain and surrounding structures were obtained without intravenous contrast. COMPARISON:  None. FINDINGS: Motion artifact is present. Brain: There is no acute infarction or intracranial hemorrhage. There is no intracranial mass, mass effect, or edema. There is no hydrocephalus or extra-axial fluid collection. Ventricles and sulci are normal in size and configuration. Vascular: Major vessel flow voids at the skull base are preserved. Skull and upper cervical spine: Normal marrow signal is preserved. Sinuses/Orbits: Paranasal sinuses are aerated. Orbits are unremarkable. Other: Sella is unremarkable.  Mastoid air cells are clear. IMPRESSION: No acute infarct, hemorrhage, or mass. Electronically Signed   By: Macy Mis M.D.   On: 04/29/2021 15:21   CT HEAD CODE STROKE WO CONTRAST  Result Date: 04/29/2021 CLINICAL DATA:  Code stroke. Neuro deficit, acute, stroke suspected. EXAM: CT ANGIOGRAPHY HEAD AND NECK TECHNIQUE: Multidetector CT imaging of the head and neck was performed using the standard protocol  during bolus administration of intravenous contrast. Multiplanar CT image reconstructions and MIPs were obtained to evaluate the vascular anatomy. Carotid stenosis measurements (when applicable) are obtained utilizing NASCET criteria, using the distal internal carotid diameter as the denominator. RADIATION DOSE REDUCTION: This exam was performed according to the departmental dose-optimization program which includes automated exposure control, adjustment of the mA and/or kV according to patient size and/or use of iterative reconstruction technique. CONTRAST:  12mL OMNIPAQUE IOHEXOL 350 MG/ML SOLN COMPARISON:  None.  Brain MRI 07/20/2013.  Head CT 07/20/2013. FINDINGS: CT HEAD FINDINGS Brain: Cerebral volume is normal. There is no acute intracranial hemorrhage. No demarcated cortical infarct. No extra-axial fluid collection. No evidence of an intracranial mass. No midline shift. Vascular: No hyperdense vessel. Skull: Normal. Negative for fracture or focal lesion. Sinuses/Orbits: Visualized orbits show no acute finding. Small-volume frothy secretions within the right maxillary sinus. ASPECTS ([place] Stroke Program Early CT Score) - Ganglionic level infarction (caudate, lentiform nuclei, internal capsule, insula, M1-M3 cortex): 7 - Supraganglionic infarction (M4-M6 cortex): 3 Total score (0-10 with 10 being normal): 10 These results were called by telephone at the time of interpretation on 04/29/2021 at 2:45 pm to provider Dr. Reeves Forth, who verbally acknowledged these results. CTA NECK FINDINGS Aortic arch: Standard aortic branching. The visualized aortic arch is normal in caliber. Streak and beam hardening artifact arising a dense left-sided contrast bolus partially obscures the left subclavian artery. Within this limitation, there is no appreciable hemodynamically significant stenosis within the innominate or proximal subclavian arteries. Right carotid system: CCA and ICA patent within the neck without stenosis or significant atherosclerotic disease. Tortuosity of the cervical ICA. Left carotid system: CCA and ICA patent within the neck without stenosis or significant atherosclerotic disease. Tortuosity of the cervical ICA. Vertebral arteries: Vertebral arteries codominant and patent within the neck without stenosis. Skeleton: Cervical spondylosis. No acute bony abnormality or aggressive osseous lesion. Other neck: Thyroid nodules, the largest within the left thyroid lobe measuring 1.7 cm (series 6, image 76). Upper chest: No consolidation within the imaged lung apices. Mild linear atelectasis within the partially imaged right  lower lobe. Review of the MIP images confirms the above findings CTA HEAD FINDINGS Anterior circulation: The intracranial internal carotid arteries are patent. The M1 middle cerebral arteries are patent. Mild irregularity of the M2 and more distal MCA vessels, bilaterally. No M2 proximal branch occlusion or high-grade proximal stenosis is identified. The anterior cerebral arteries are patent. No intracranial aneurysm is identified. Posterior circulation: The intracranial vertebral arteries are patent. The basilar artery is patent. The posterior cerebral arteries are patent. The right PCA is fetal in origin. The left posterior communicating artery is diminutive or absent. Venous sinuses: Within the limitations of contrast timing, no convincing thrombus. Anatomic variants: As described. Review of the MIP images confirms the above findings No emergent large vessel occlusion identified. These results were called by telephone at the time of interpretation on 04/29/2021 at 2:50 pm to provider Dr. Reeves Forth, who verbally acknowledged these results. IMPRESSION: CT head: 1. No evidence of acute intracranial abnormality. 2. Mild right maxillary sinusitis. CTA neck: 1. The common carotid, internal carotid and vertebral arteries are patent within the neck without stenosis or significant atherosclerotic disease. 2. Multiple thyroid nodules, the largest measuring 1.7 cm within left thyroid lobe. A non-emergent thyroid ultrasound is recommended for further evaluation. 3. Mild linear atelectasis versus pulmonary scarring within the partially imaged right lower lobe. CTA head: 1. No intracranial large vessel occlusion or proximal high-grade arterial stenosis. 2.  Mild irregularity of the M2 and more distal MCA vessels bilaterally, likely due to atherosclerotic disease. Electronically Signed   By: Kellie Simmering D.O.   On: 04/29/2021 15:11   CT ANGIO HEAD NECK W WO CM (CODE STROKE)  Result Date: 04/29/2021 CLINICAL DATA:  Code stroke.  Neuro deficit, acute, stroke suspected. EXAM: CT ANGIOGRAPHY HEAD AND NECK TECHNIQUE: Multidetector CT imaging of the head and neck was performed using the standard protocol during bolus administration of intravenous contrast. Multiplanar CT image reconstructions and MIPs were obtained to evaluate the vascular anatomy. Carotid stenosis measurements (when applicable) are obtained utilizing NASCET criteria, using the distal internal carotid diameter as the denominator. RADIATION DOSE REDUCTION: This exam was performed according to the departmental dose-optimization program which includes automated exposure control, adjustment of the mA and/or kV according to patient size and/or use of iterative reconstruction technique. CONTRAST:  58mL OMNIPAQUE IOHEXOL 350 MG/ML SOLN COMPARISON:  None. Brain MRI 07/20/2013.  Head CT 07/20/2013. FINDINGS: CT HEAD FINDINGS Brain: Cerebral volume is normal. There is no acute intracranial hemorrhage. No demarcated cortical infarct. No extra-axial fluid collection. No evidence of an intracranial mass. No midline shift. Vascular: No hyperdense vessel. Skull: Normal. Negative for fracture or focal lesion. Sinuses/Orbits: Visualized orbits show no acute finding. Small-volume frothy secretions within the right maxillary sinus. ASPECTS ([place] Stroke Program Early CT Score) - Ganglionic level infarction (caudate, lentiform nuclei, internal capsule, insula, M1-M3 cortex): 7 - Supraganglionic infarction (M4-M6 cortex): 3 Total score (0-10 with 10 being normal): 10 These results were called by telephone at the time of interpretation on 04/29/2021 at 2:45 pm to provider Dr. Reeves Forth, who verbally acknowledged these results. CTA NECK FINDINGS Aortic arch: Standard aortic branching. The visualized aortic arch is normal in caliber. Streak and beam hardening artifact arising a dense left-sided contrast bolus partially obscures the left subclavian artery. Within this limitation, there is no appreciable  hemodynamically significant stenosis within the innominate or proximal subclavian arteries. Right carotid system: CCA and ICA patent within the neck without stenosis or significant atherosclerotic disease. Tortuosity of the cervical ICA. Left carotid system: CCA and ICA patent within the neck without stenosis or significant atherosclerotic disease. Tortuosity of the cervical ICA. Vertebral arteries: Vertebral arteries codominant and patent within the neck without stenosis. Skeleton: Cervical spondylosis. No acute bony abnormality or aggressive osseous lesion. Other neck: Thyroid nodules, the largest within the left thyroid lobe measuring 1.7 cm (series 6, image 76). Upper chest: No consolidation within the imaged lung apices. Mild linear atelectasis within the partially imaged right lower lobe. Review of the MIP images confirms the above findings CTA HEAD FINDINGS Anterior circulation: The intracranial internal carotid arteries are patent. The M1 middle cerebral arteries are patent. Mild irregularity of the M2 and more distal MCA vessels, bilaterally. No M2 proximal branch occlusion or high-grade proximal stenosis is identified. The anterior cerebral arteries are patent. No intracranial aneurysm is identified. Posterior circulation: The intracranial vertebral arteries are patent. The basilar artery is patent. The posterior cerebral arteries are patent. The right PCA is fetal in origin. The left posterior communicating artery is diminutive or absent. Venous sinuses: Within the limitations of contrast timing, no convincing thrombus. Anatomic variants: As described. Review of the MIP images confirms the above findings No emergent large vessel occlusion identified. These results were called by telephone at the time of interpretation on 04/29/2021 at 2:50 pm to provider Dr. Reeves Forth, who verbally acknowledged these results. IMPRESSION: CT head: 1. No evidence of acute intracranial abnormality. 2.  Mild right maxillary  sinusitis. CTA neck: 1. The common carotid, internal carotid and vertebral arteries are patent within the neck without stenosis or significant atherosclerotic disease. 2. Multiple thyroid nodules, the largest measuring 1.7 cm within left thyroid lobe. A non-emergent thyroid ultrasound is recommended for further evaluation. 3. Mild linear atelectasis versus pulmonary scarring within the partially imaged right lower lobe. CTA head: 1. No intracranial large vessel occlusion or proximal high-grade arterial stenosis. 2. Mild irregularity of the M2 and more distal MCA vessels bilaterally, likely due to atherosclerotic disease. Electronically Signed   By: Kellie Simmering D.O.   On: 04/29/2021 15:11   _______________________________________________________________________________________________________ Latest  Blood pressure 119/79, pulse 76, temperature 98.4 F (36.9 C), temperature source Oral, resp. rate 18, height $RemoveBe'5\' 6"'ELlRLlPqB$  (1.676 m), weight 91.2 kg, last menstrual period 03/07/2012, SpO2 98 %.   Vitals  labs and radiology finding personally reviewed  Review of Systems:    Pertinent positives include:   fatigue,  nausea,left side tightness  Constitutional:  No weight loss, night sweats, Fevers, chills,weight loss  HEENT:  No headaches, Difficulty swallowing,Tooth/dental problems,Sore throat,  No sneezing, itching, ear ache, nasal congestion, post nasal drip,  Cardio-vascular:  No chest pain, Orthopnea, PND, anasarca, dizziness, palpitations.no Bilateral lower extremity swelling  GI:  No heartburn, indigestion, abdominal pain, vomiting, diarrhea, change in bowel habits, loss of appetite, melena, blood in stool, hematemesis Resp:  no shortness of breath at rest. No dyspnea on exertion, No excess mucus, no productive cough, No non-productive cough, No coughing up of blood.No change in color of mucus.No wheezing. Skin:  no rash or lesions. No jaundice GU:  no dysuria, change in color of urine, no urgency  or frequency. No straining to urinate.  No flank pain.  Musculoskeletal:  No joint pain or no joint swelling. No decreased range of motion. No back pain.  Psych:  No change in mood or affect. No depression or anxiety. No memory loss.  Neuro: no localizing neurological complaints, no tingling, no weakness, no double vision, no gait abnormality, no slurred speech, no confusion  All systems reviewed and apart from [place] all are negative _______________________________________________________________________________________________ Past Medical History:   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, lst migraine 7/16 13 in ER see EPIC   Retrolisthesis    L4 and L5      Past Surgical History:  Procedure Laterality Date   ABDOMINAL HYSTERECTOMY     BOWEL RESECTION  11/30/2011   Procedure: SMALL BOWEL RESECTION;  Surgeon: Edward Jolly, MD;  Location: [facility];  Service: General;;   BREAST RECONSTRUCTION WITH PLACEMENT OF TISSUE EXPANDER AND ALLODERM Bilateral 01/28/2018   Procedure: BILATERAL BREAST RECONSTRUCTION WITH PLACEMENT OF TISSUE EXPANDER AND ALLODERM;  Surgeon: Irene Limbo, MD;  Location: [facility];  Service: Plastics;  Laterality: Bilateral;   BREATH TEK H PYLORI  09/11/2011   Procedure: BREATH TEK H PYLORI;  Surgeon: Edward Jolly, MD;  Location: [facility];  Service: General;  Laterality: N/A;   CESAREAN SECTION     CHOLECYSTECTOMY  CYSTOSCOPY N/A 11/11/2017   Procedure: CYSTOSCOPY;  Surgeon: Arvella Nigh, MD;  Location: [facility];  Service: Gynecology;  Laterality: N/A;   ESOPHAGOGASTRODUODENOSCOPY  03/14/2012   Procedure:  ESOPHAGOGASTRODUODENOSCOPY (EGD);  Surgeon: Shann Medal, MD;  Location: [facility];  Service: General;  Laterality: N/A;   ESOPHAGOGASTRODUODENOSCOPY  04/25/2012   Procedure: ESOPHAGOGASTRODUODENOSCOPY (EGD);  Surgeon: Shann Medal, MD;  Location: [facility];  Service: General;  Laterality: N/A;   ESOPHAGOGASTRODUODENOSCOPY N/A 03/31/2013   Procedure: ESOPHAGOGASTRODUODENOSCOPY (EGD);  Surgeon: Shann Medal, MD;  Location: [facility];  Service: General;  Laterality: N/A;   ESOPHAGOGASTRODUODENOSCOPY N/A 04/09/2014   Procedure: ESOPHAGOGASTRODUODENOSCOPY (EGD);  Surgeon: Alphonsa Overall, MD;  Location: [facility];  Service: General;  Laterality: N/A;   GASTRIC ROUX-EN-Y  11/30/2011   Procedure: LAPAROSCOPIC ROUX-EN-Y GASTRIC;  Surgeon: Edward Jolly, MD;  Location: [facility];  Service: General;  Laterality: N/A;   HYSTERECTOMY ABDOMINAL WITH SALPINGO-OOPHORECTOMY     LAPAROSCOPIC VAGINAL HYSTERECTOMY WITH SALPINGO OOPHORECTOMY Bilateral 11/11/2017   Procedure: LAPAROSCOPIC ASSISTED VAGINAL HYSTERECTOMY WITH SALPINGO OOPHORECTOMY;  Surgeon: Arvella Nigh, MD;  Location: [facility];  Service: Gynecology;  Laterality: Bilateral;   laporoscopy  2007   LIPOSUCTION WITH LIPOFILLING Bilateral 05/31/2018   Procedure: LIPOFILLING BILATERAL CHEST;  Surgeon: Irene Limbo, MD;  Location: [facility];  Service: Plastics;  Laterality: Bilateral;   REMOVAL OF BILATERAL TISSUE EXPANDERS WITH PLACEMENT OF BILATERAL BREAST IMPLANTS Bilateral 05/31/2018   Procedure: REMOVAL OF BILATERAL TISSUE EXPANDERS WITH PLACEMENT OF BILATERAL BREAST IMPLANTS;  Surgeon: Irene Limbo, MD;  Location: [facility];  Service: Plastics;  Laterality: Bilateral;   TONSILLECTOMY     TOTAL MASTECTOMY Bilateral 01/28/2018    BILATERAL TOTAL MASTECTOMY (Bilateral Breast)   TOTAL MASTECTOMY Bilateral 01/28/2018   Procedure: BILATERAL TOTAL MASTECTOMY;  Surgeon: Excell Seltzer, MD;   Location: [facility];  Service: General;  Laterality: Bilateral;    Social History:  Ambulatory   independently       reports that she has never smoked. She has never used smokeless tobacco. She reports that she does not drink alcohol and does not use drugs.    Family History:   Family History  Problem Relation Age of Onset   Diabetes Mother    Hypertension Mother    Depression Mother    OCD Mother    Depression Brother    Cancer Maternal Aunt 25       ovarian    Depression Maternal Aunt    Diabetes Maternal Aunt    Cancer Maternal Grandmother        Lung   COPD Maternal Grandmother    Cancer Maternal Grandfather 75       Prostate   Diabetes Maternal Grandfather    Hearing loss Maternal Grandfather    Hypertension Maternal Grandfather    Stroke Maternal Grandfather    Cancer Paternal Grandmother 88       Colon   Diabetes type II Other    ______________________________________________________________________________________________ Allergies: Allergies  Allergen Reactions   Ace Inhibitors Other (See Comments)    Spaces out and triggers migraines   Atarax [Hydroxyzine Hcl] Anaphylaxis   Cephalosporins Anaphylaxis   Codeine Anaphylaxis and Rash    Tolerates Dilaudid and Percocet. Tolerates Dilaudid and Percocet. Tolerates Dilaudid and Percocet.   Compazine Anaphylaxis    Tolerates Phenergan.   Decadron [Dexamethasone] Anaphylaxis   Demerol Anaphylaxis   Glucosamine     Other reaction(s): Other (See Comments) Religous reasons Religous  reasons   Hydroxyzine Hcl Anaphylaxis   Metoclopramide Hcl Anaphylaxis   Morphine Anaphylaxis    Tolerates Dilaudid and Percocet.   Morphine And Related Anaphylaxis    Tolerates Dilaudid and Percocet.    Ondansetron Anaphylaxis   Other Anaphylaxis and Other (See Comments)    ALL STEROIDS CAUSES ULCERS; also to DERMABOND  Tolerates Promethazine    Penicillins Anaphylaxis    PATIENT HAS HAD A PCN REACTION WITH IMMEDIATE RASH,  FACIAL/TONGUE/THROAT SWELLING, SOB, OR LIGHTHEADEDNESS WITH HYPOTENSION:  #  #  YES  #  #  Has patient had a PCN reaction causing severe rash involving mucus membranes or skin necrosis: unknown Has patient had a PCN reaction that required hospitalization: unknown Has patient had a PCN reaction occurring within the last 10 years: NO    Poractant Alfa Other (See Comments)    Religous reasons--per pt ok to take Heparin when medically necessary    Pork-Derived Products Other (See Comments)    Religous reasons--per pt ok to take Heparin when medically necessary   Prozac [Fluoxetine Hcl] Other (See Comments)    Pt reports severe negative mood altering after taking.    Shellfish-Derived Products Other (See Comments)    Religous reasons   Stevia Glycerite Extract [Flavoring Agent] Swelling    Tongue swelling   Sulfa Antibiotics Anaphylaxis   Sulfasalazine Anaphylaxis   Sumatriptan Anaphylaxis    Causes migraine    Triptans Anaphylaxis    Makes migraines worse also   Zanaflex [Tizanidine Hcl] Other (See Comments)    Loss of balance, lightheadedness, spaces out   Zofran Anaphylaxis   Pravastatin Other (See Comments)    Leg cramps   Skin Adhesives [Cyanoacrylate] Rash    Dermabond, skin peels   Stevioside Swelling   Dihydroergotamine Other (See Comments)    unknown   Periactin [Cyproheptadine] Rash     Prior to Admission medications   Medication Sig Start Date End Date Taking? Authorizing Provider  acetaminophen (TYLENOL) 500 MG tablet Take 1,000 mg by mouth every 6 (six) hours as needed for moderate pain.    [provider]  buPROPion (WELLBUTRIN XL) 150 MG 24 hr tablet Take 150 mg by mouth daily.    [provider]  Calcium Carb-Cholecalciferol 500-200 MG-UNIT TABS Take 1 tablet by mouth daily.    [provider]  Continuous Blood Gluc Receiver (DEXCOM G6 RECEIVER) DEVI Use a directed 02/12/21   Shamleffer, Melanie Crazier, MD  Continuous Blood Gluc Sensor  (FREESTYLE LIBRE 3 SENSOR) MISC 1 each by Does not apply route daily. Place 1 sensor on the skin every 14 days. Use to check glucose continuously 02/21/21   Renato Shin, MD  Continuous Blood Gluc Transmit (DEXCOM G6 TRANSMITTER) MISC 1 every 90 days 02/12/21   Shamleffer, Melanie Crazier, MD  EPINEPHrine (EPI-PEN) 0.3 mg/0.3 mL SOAJ injection Inject 0.3 mg into the muscle once.    [provider]  metFORMIN (GLUCOPHAGE-XR) 500 MG 24 hr tablet Take 1 tablet (500 mg total) by mouth daily. 02/06/21   Renato Shin, MD  methocarbamol (ROBAXIN) 500 MG tablet Take 1 tablet (500 mg total) by mouth every 8 (eight) hours as needed for muscle spasms. 05/31/18   Irene Limbo, MD  Multiple Vitamin (MULTIVITAMIN WITH MINERALS) TABS tablet Take 1 tablet by mouth daily. [employer] Multivitamin    [provider]  OnabotulinumtoxinA (BOTOX IJ) Inject 1 each as directed every 3 (three) months. For migraines    [provider]  Plecanatide (TRULANCE) 3  MG TABS Take 3 mg by mouth daily.     [provider]  promethazine (PHENERGAN) 12.5 MG tablet Take 1 tablet (12.5 mg total) by mouth every 6 (six) hours as needed for nausea or vomiting. 05/31/18   Irene Limbo, MD  Semaglutide,0.25 or 0.5MG /DOS, (OZEMPIC, 0.25 OR 0.5 MG/DOSE,) 2 MG/1.5ML SOPN Inject 0.5 mg into the skin once a week. 03/13/21   Renato Shin, MD  vitamin B-12 (CYANOCOBALAMIN) 100 MCG tablet Take 100 mcg by mouth daily.    [provider]    ___________________________________________________________________________________________________ Physical Exam: Vitals with BMI 04/29/2021 04/29/2021 04/29/2021  Height - - -  Weight - - -  BMI - - -  Systolic [phone]  Diastolic 79 76 72  Pulse 76 79 79     1. General:  in No  Acute distress   Chronically ill   -appearing 2. Psychological: Alert and   Oriented 3. Head/ENT:    Dry Mucous Membranes                          Head Non  traumatic, neck supple                          Poor Dentition 4. SKIN: decreased Skin turgor,  Skin clean Dry and intact no rash 5. Heart: Regular rate and rhythm no  Murmur, no Rub or gallop 6. Lungs:  no wheezes or crackles   7. Abdomen: Soft,  non-tender, Non distended   obese  bowel sounds present 8. Lower extremities: no clubbing, cyanosis, no  edema 9. Neurologically pt resists any attempt to move her left arm or leg but is able to relax periodically, speak with slurred speech, transient left facial droop on exam but not at rest Strength appears intact 10. MSK: Normal range of motion    Chart has been reviewed  ______________________________________________________________________________________________  Assessment/Plan  46 y.o. female with medical history significant of DM2 anxiety, depression, migraine headaches with history of hemiplegic migraines affecting the left side     Admitted for  Left side tightness  Present on Admission:  Focal neurological deficit  Hemiplegic migraine     Focal neurological deficit MRI brain wnl Pt with tightness no weakness noted Obtain electrolytes Appreciate neurology consult Suspect functional symptoms   Diabetes mellitus ([place])  - Order Sensitive SSI     -  check TSH and HgA1C  - Hold by mouth medications     Adjustment disorder with mixed anxiety and depressed mood restart home meds when able to tolerate    Other plan as per orders.  DVT prophylaxis:  SCD      Code Status:    Code Status: Prior FULL CODE    I had personally discussed CODE STATUS with patient and family     Family Communication:   Family   at  Bedside  plan of care was discussed   with Husband   Disposition Plan:     likely will need placement for rehabilitation if not improved                           Following barriers for discharge:                            able to ambulate  Would benefit from PT/OT eval prior to  DC  Ordered                                       Transition of care consulted                   Consults called:    neurology   Admission status:  ED Disposition     ED Disposition  [place]: [place] [[id number]]  Level of Care: Telemetry Medical [104]  May place patient in observation at [facility] or [place] if equivalent level of care is available:: No  Covid Evaluation: Confirmed COVID Negative  Diagnosis: Focal neurological deficit [032122]  Admitting Physician: Toy Baker [[id number]]  Attending Physician: Toy Baker [[id number]]           Obs     Level of care     tele  For  indefinitely please discontinue once patient no longer qualifies COVID-19 Labs    Lab Results  Component Value Date   [place] 04/29/2021     Precautions: admitted as   Covid Negative        Fusako Tanabe 04/29/2021, 8:37 PM    [facility]     after 2 AM please page floor coverage PA If 7AM-7PM, please contact the day team taking care of the patient using [email or web address]   Patient was evaluated in the context of the global COVID-19 pandemic, which necessitated consideration that the patient might be at risk for infection with the SARS-CoV-2 virus that causes COVID-19. Institutional protocols and algorithms that pertain to the evaluation of patients at risk for COVID-19 are in a state of rapid change based on information released by regulatory bodies including the CDC and federal and state organizations. These policies and algorithms were followed during the patient's care.

## 2021-04-29 NOTE — ED Notes (Signed)
Patient left against medical advice.

## 2021-04-29 NOTE — ED Triage Notes (Signed)
Pt BIB ems from work; pt was on conference call with client when client noticed pt having sudden onset of L sided facial droop, L sided weakness, slurred speech; LSN 1310 this afternoon; pt has hx migraines, DM2  BP 152/94 CBG 127 HR 96

## 2021-04-29 NOTE — ED Provider Notes (Signed)
°  Physical Exam  BP 113/72    Pulse 79    Temp 98.4 F (36.9 C) (Oral)    Resp 16    Ht 5\' 6"  (1.676 m)    Wt 91.2 kg    LMP 03/07/2012    SpO2 98%    BMI 32.45 kg/m   Physical Exam  Procedures  Procedures  ED Course / MDM   Clinical Course as of 04/29/21 1906  Tue Apr 29, 2021  1512 Patient has had imaging: CT head, CTA head and neck, MRI brain-all negative.  Case discussed with neuro hospitalist at the bedside, code stroke canceled.  He will write a consult.  He recommends "migraine cocktail." [EW]    Clinical Course User Index [EW] Daleen Bo, MD   Medical Decision Making Amount and/or Complexity of Data Reviewed Labs: ordered. Radiology: ordered.  Risk Prescription drug management.   Received patient in signout.  Left-sided weakness.  History of same.  Has been diagnosed previously both with conversion and with complicated migraines.  Has been treated with migraine cocktail and continued weakness.  Discussed with Stevie from neurology.  Also discussed with Dr.Palikh.  Since no improvement recommend admission to the hospital.  Can have PT OT and potentially psych consult.  They can follow as needed. Does have history of drug abuse.  Now requesting some pain medicines, however will hold off on Dilaudid and will give more along a muscle relaxer       Davonna Belling, MD 04/29/21 1907

## 2021-04-29 NOTE — Assessment & Plan Note (Addendum)
MRI brain wnl Pt with tightness no weakness noted Obtain electrolytes Appreciate neurology consult Suspect functional symptoms

## 2021-04-29 NOTE — Discharge Instructions (Signed)
Follow up with your neurologist.

## 2021-04-29 NOTE — ED Notes (Signed)
Pt refused her insulin stated she get hypoglycemic very easily. Pt also requested to talk to MD, this RN made MD aware.

## 2021-04-29 NOTE — Assessment & Plan Note (Signed)
-   Order Sensitive  SSI     -  check TSH and HgA1C  - Hold by mouth medications*  

## 2021-04-30 NOTE — ED Notes (Signed)
Patient was able to walk to the car moves all extremities.

## 2021-05-05 DIAGNOSIS — I10 Essential (primary) hypertension: Secondary | ICD-10-CM | POA: Diagnosis not present

## 2021-05-05 DIAGNOSIS — S8001XA Contusion of right knee, initial encounter: Secondary | ICD-10-CM | POA: Diagnosis not present

## 2021-05-05 DIAGNOSIS — R7303 Prediabetes: Secondary | ICD-10-CM | POA: Diagnosis not present

## 2021-05-05 DIAGNOSIS — E162 Hypoglycemia, unspecified: Secondary | ICD-10-CM | POA: Diagnosis not present

## 2021-05-07 DIAGNOSIS — F1120 Opioid dependence, uncomplicated: Secondary | ICD-10-CM | POA: Diagnosis not present

## 2021-05-19 DIAGNOSIS — Z9884 Bariatric surgery status: Secondary | ICD-10-CM | POA: Diagnosis not present

## 2021-05-19 DIAGNOSIS — K911 Postgastric surgery syndromes: Secondary | ICD-10-CM | POA: Diagnosis not present

## 2021-05-19 DIAGNOSIS — E1169 Type 2 diabetes mellitus with other specified complication: Secondary | ICD-10-CM | POA: Diagnosis not present

## 2021-05-19 DIAGNOSIS — E162 Hypoglycemia, unspecified: Secondary | ICD-10-CM | POA: Diagnosis not present

## 2021-05-21 DIAGNOSIS — F1120 Opioid dependence, uncomplicated: Secondary | ICD-10-CM | POA: Diagnosis not present

## 2021-05-27 ENCOUNTER — Encounter (HOSPITAL_COMMUNITY): Payer: Self-pay | Admitting: Radiology

## 2021-05-29 DIAGNOSIS — E162 Hypoglycemia, unspecified: Secondary | ICD-10-CM | POA: Diagnosis not present

## 2021-05-29 DIAGNOSIS — E1169 Type 2 diabetes mellitus with other specified complication: Secondary | ICD-10-CM | POA: Diagnosis not present

## 2021-06-04 DIAGNOSIS — F1120 Opioid dependence, uncomplicated: Secondary | ICD-10-CM | POA: Diagnosis not present

## 2021-07-02 DIAGNOSIS — F1120 Opioid dependence, uncomplicated: Secondary | ICD-10-CM | POA: Diagnosis not present

## 2021-07-04 DIAGNOSIS — Z9884 Bariatric surgery status: Secondary | ICD-10-CM | POA: Diagnosis not present

## 2021-07-04 DIAGNOSIS — I10 Essential (primary) hypertension: Secondary | ICD-10-CM | POA: Diagnosis not present

## 2021-07-04 DIAGNOSIS — R7303 Prediabetes: Secondary | ICD-10-CM | POA: Diagnosis not present

## 2021-07-04 DIAGNOSIS — E162 Hypoglycemia, unspecified: Secondary | ICD-10-CM | POA: Diagnosis not present

## 2021-09-24 DIAGNOSIS — N390 Urinary tract infection, site not specified: Secondary | ICD-10-CM | POA: Diagnosis not present

## 2021-10-06 DIAGNOSIS — G43709 Chronic migraine without aura, not intractable, without status migrainosus: Secondary | ICD-10-CM | POA: Diagnosis not present

## 2021-10-23 DIAGNOSIS — L578 Other skin changes due to chronic exposure to nonionizing radiation: Secondary | ICD-10-CM | POA: Diagnosis not present

## 2021-10-23 DIAGNOSIS — L219 Seborrheic dermatitis, unspecified: Secondary | ICD-10-CM | POA: Diagnosis not present

## 2021-10-23 DIAGNOSIS — D239 Other benign neoplasm of skin, unspecified: Secondary | ICD-10-CM | POA: Diagnosis not present

## 2021-10-23 DIAGNOSIS — D485 Neoplasm of uncertain behavior of skin: Secondary | ICD-10-CM | POA: Diagnosis not present

## 2021-10-23 DIAGNOSIS — D225 Melanocytic nevi of trunk: Secondary | ICD-10-CM | POA: Diagnosis not present

## 2021-11-07 NOTE — Progress Notes (Deleted)
This encounter was created in error - please disregard.

## 2021-11-25 DIAGNOSIS — Z8262 Family history of osteoporosis: Secondary | ICD-10-CM | POA: Diagnosis not present

## 2021-11-25 DIAGNOSIS — Z780 Asymptomatic menopausal state: Secondary | ICD-10-CM | POA: Diagnosis not present

## 2021-11-25 DIAGNOSIS — N958 Other specified menopausal and perimenopausal disorders: Secondary | ICD-10-CM | POA: Diagnosis not present

## 2021-12-02 DIAGNOSIS — E119 Type 2 diabetes mellitus without complications: Secondary | ICD-10-CM | POA: Diagnosis not present

## 2021-12-02 DIAGNOSIS — R5383 Other fatigue: Secondary | ICD-10-CM | POA: Diagnosis not present

## 2021-12-02 DIAGNOSIS — R7303 Prediabetes: Secondary | ICD-10-CM | POA: Diagnosis not present

## 2021-12-02 DIAGNOSIS — Z9884 Bariatric surgery status: Secondary | ICD-10-CM | POA: Diagnosis not present

## 2021-12-02 DIAGNOSIS — F900 Attention-deficit hyperactivity disorder, predominantly inattentive type: Secondary | ICD-10-CM | POA: Diagnosis not present

## 2021-12-16 DIAGNOSIS — D225 Melanocytic nevi of trunk: Secondary | ICD-10-CM | POA: Diagnosis not present

## 2021-12-16 DIAGNOSIS — D485 Neoplasm of uncertain behavior of skin: Secondary | ICD-10-CM | POA: Diagnosis not present

## 2021-12-19 DIAGNOSIS — E1169 Type 2 diabetes mellitus with other specified complication: Secondary | ICD-10-CM | POA: Diagnosis not present

## 2021-12-22 DIAGNOSIS — F902 Attention-deficit hyperactivity disorder, combined type: Secondary | ICD-10-CM | POA: Diagnosis not present

## 2021-12-22 DIAGNOSIS — E7800 Pure hypercholesterolemia, unspecified: Secondary | ICD-10-CM | POA: Diagnosis not present

## 2021-12-22 DIAGNOSIS — E162 Hypoglycemia, unspecified: Secondary | ICD-10-CM | POA: Diagnosis not present

## 2021-12-31 DIAGNOSIS — L089 Local infection of the skin and subcutaneous tissue, unspecified: Secondary | ICD-10-CM | POA: Diagnosis not present

## 2022-01-14 DIAGNOSIS — Z9884 Bariatric surgery status: Secondary | ICD-10-CM | POA: Diagnosis not present

## 2022-01-14 DIAGNOSIS — E161 Other hypoglycemia: Secondary | ICD-10-CM | POA: Diagnosis not present

## 2022-01-19 DIAGNOSIS — F902 Attention-deficit hyperactivity disorder, combined type: Secondary | ICD-10-CM | POA: Diagnosis not present

## 2022-01-19 DIAGNOSIS — F331 Major depressive disorder, recurrent, moderate: Secondary | ICD-10-CM | POA: Diagnosis not present

## 2022-01-26 DIAGNOSIS — F902 Attention-deficit hyperactivity disorder, combined type: Secondary | ICD-10-CM | POA: Diagnosis not present

## 2022-01-26 DIAGNOSIS — F331 Major depressive disorder, recurrent, moderate: Secondary | ICD-10-CM | POA: Diagnosis not present

## 2022-02-02 DIAGNOSIS — F331 Major depressive disorder, recurrent, moderate: Secondary | ICD-10-CM | POA: Diagnosis not present

## 2022-02-02 DIAGNOSIS — F902 Attention-deficit hyperactivity disorder, combined type: Secondary | ICD-10-CM | POA: Diagnosis not present

## 2022-02-09 DIAGNOSIS — F331 Major depressive disorder, recurrent, moderate: Secondary | ICD-10-CM | POA: Diagnosis not present

## 2022-02-09 DIAGNOSIS — F902 Attention-deficit hyperactivity disorder, combined type: Secondary | ICD-10-CM | POA: Diagnosis not present

## 2022-02-16 DIAGNOSIS — F331 Major depressive disorder, recurrent, moderate: Secondary | ICD-10-CM | POA: Diagnosis not present

## 2022-02-16 DIAGNOSIS — F902 Attention-deficit hyperactivity disorder, combined type: Secondary | ICD-10-CM | POA: Diagnosis not present

## 2022-02-18 DIAGNOSIS — M25511 Pain in right shoulder: Secondary | ICD-10-CM | POA: Diagnosis not present

## 2022-02-18 DIAGNOSIS — S46001A Unspecified injury of muscle(s) and tendon(s) of the rotator cuff of right shoulder, initial encounter: Secondary | ICD-10-CM | POA: Diagnosis not present

## 2022-02-20 DIAGNOSIS — G43E09 Chronic migraine with aura, not intractable, without status migrainosus: Secondary | ICD-10-CM | POA: Diagnosis not present

## 2022-02-20 DIAGNOSIS — G43709 Chronic migraine without aura, not intractable, without status migrainosus: Secondary | ICD-10-CM | POA: Diagnosis not present

## 2022-02-23 DIAGNOSIS — F331 Major depressive disorder, recurrent, moderate: Secondary | ICD-10-CM | POA: Diagnosis not present

## 2022-02-23 DIAGNOSIS — F902 Attention-deficit hyperactivity disorder, combined type: Secondary | ICD-10-CM | POA: Diagnosis not present

## 2022-03-02 DIAGNOSIS — F902 Attention-deficit hyperactivity disorder, combined type: Secondary | ICD-10-CM | POA: Diagnosis not present

## 2022-03-02 DIAGNOSIS — F331 Major depressive disorder, recurrent, moderate: Secondary | ICD-10-CM | POA: Diagnosis not present

## 2022-03-09 DIAGNOSIS — F331 Major depressive disorder, recurrent, moderate: Secondary | ICD-10-CM | POA: Diagnosis not present

## 2022-03-09 DIAGNOSIS — F902 Attention-deficit hyperactivity disorder, combined type: Secondary | ICD-10-CM | POA: Diagnosis not present

## 2022-03-11 DIAGNOSIS — R7309 Other abnormal glucose: Secondary | ICD-10-CM | POA: Diagnosis not present

## 2022-03-11 DIAGNOSIS — E162 Hypoglycemia, unspecified: Secondary | ICD-10-CM | POA: Diagnosis not present

## 2022-03-17 DIAGNOSIS — E119 Type 2 diabetes mellitus without complications: Secondary | ICD-10-CM | POA: Diagnosis not present

## 2022-03-24 DIAGNOSIS — R41840 Attention and concentration deficit: Secondary | ICD-10-CM | POA: Diagnosis not present

## 2022-03-24 DIAGNOSIS — E7800 Pure hypercholesterolemia, unspecified: Secondary | ICD-10-CM | POA: Diagnosis not present

## 2022-03-24 DIAGNOSIS — E162 Hypoglycemia, unspecified: Secondary | ICD-10-CM | POA: Diagnosis not present

## 2022-03-30 DIAGNOSIS — F902 Attention-deficit hyperactivity disorder, combined type: Secondary | ICD-10-CM | POA: Diagnosis not present

## 2022-03-30 DIAGNOSIS — F331 Major depressive disorder, recurrent, moderate: Secondary | ICD-10-CM | POA: Diagnosis not present

## 2022-04-03 DIAGNOSIS — F902 Attention-deficit hyperactivity disorder, combined type: Secondary | ICD-10-CM | POA: Diagnosis not present

## 2022-04-03 DIAGNOSIS — R7303 Prediabetes: Secondary | ICD-10-CM | POA: Diagnosis not present

## 2022-04-03 DIAGNOSIS — F339 Major depressive disorder, recurrent, unspecified: Secondary | ICD-10-CM | POA: Diagnosis not present

## 2022-04-03 DIAGNOSIS — E7800 Pure hypercholesterolemia, unspecified: Secondary | ICD-10-CM | POA: Diagnosis not present

## 2022-04-03 DIAGNOSIS — Z0000 Encounter for general adult medical examination without abnormal findings: Secondary | ICD-10-CM | POA: Diagnosis not present

## 2022-04-06 DIAGNOSIS — F902 Attention-deficit hyperactivity disorder, combined type: Secondary | ICD-10-CM | POA: Diagnosis not present

## 2022-04-06 DIAGNOSIS — F331 Major depressive disorder, recurrent, moderate: Secondary | ICD-10-CM | POA: Diagnosis not present

## 2022-04-09 DIAGNOSIS — R41840 Attention and concentration deficit: Secondary | ICD-10-CM | POA: Diagnosis not present

## 2022-04-13 DIAGNOSIS — F902 Attention-deficit hyperactivity disorder, combined type: Secondary | ICD-10-CM | POA: Diagnosis not present

## 2022-04-13 DIAGNOSIS — F331 Major depressive disorder, recurrent, moderate: Secondary | ICD-10-CM | POA: Diagnosis not present

## 2022-04-16 DIAGNOSIS — Z79891 Long term (current) use of opiate analgesic: Secondary | ICD-10-CM | POA: Diagnosis not present

## 2022-04-16 DIAGNOSIS — Z79899 Other long term (current) drug therapy: Secondary | ICD-10-CM | POA: Diagnosis not present

## 2022-04-16 DIAGNOSIS — Z5181 Encounter for therapeutic drug level monitoring: Secondary | ICD-10-CM | POA: Diagnosis not present

## 2022-04-16 DIAGNOSIS — R41840 Attention and concentration deficit: Secondary | ICD-10-CM | POA: Diagnosis not present

## 2022-04-20 DIAGNOSIS — F331 Major depressive disorder, recurrent, moderate: Secondary | ICD-10-CM | POA: Diagnosis not present

## 2022-04-20 DIAGNOSIS — F902 Attention-deficit hyperactivity disorder, combined type: Secondary | ICD-10-CM | POA: Diagnosis not present

## 2022-04-24 DIAGNOSIS — M67813 Other specified disorders of tendon, right shoulder: Secondary | ICD-10-CM | POA: Diagnosis not present

## 2022-04-27 DIAGNOSIS — F331 Major depressive disorder, recurrent, moderate: Secondary | ICD-10-CM | POA: Diagnosis not present

## 2022-04-27 DIAGNOSIS — F902 Attention-deficit hyperactivity disorder, combined type: Secondary | ICD-10-CM | POA: Diagnosis not present

## 2022-05-12 DIAGNOSIS — R059 Cough, unspecified: Secondary | ICD-10-CM | POA: Diagnosis not present

## 2022-05-12 DIAGNOSIS — T3695XA Adverse effect of unspecified systemic antibiotic, initial encounter: Secondary | ICD-10-CM | POA: Diagnosis not present

## 2022-05-12 DIAGNOSIS — H66001 Acute suppurative otitis media without spontaneous rupture of ear drum, right ear: Secondary | ICD-10-CM | POA: Diagnosis not present

## 2022-05-12 DIAGNOSIS — R051 Acute cough: Secondary | ICD-10-CM | POA: Diagnosis not present

## 2022-05-12 DIAGNOSIS — B379 Candidiasis, unspecified: Secondary | ICD-10-CM | POA: Diagnosis not present

## 2022-05-15 DIAGNOSIS — M25511 Pain in right shoulder: Secondary | ICD-10-CM | POA: Diagnosis not present

## 2022-05-19 DIAGNOSIS — Z79891 Long term (current) use of opiate analgesic: Secondary | ICD-10-CM | POA: Diagnosis not present

## 2022-05-19 DIAGNOSIS — F902 Attention-deficit hyperactivity disorder, combined type: Secondary | ICD-10-CM | POA: Diagnosis not present

## 2022-05-19 DIAGNOSIS — Z5181 Encounter for therapeutic drug level monitoring: Secondary | ICD-10-CM | POA: Diagnosis not present

## 2022-05-19 DIAGNOSIS — Z79899 Other long term (current) drug therapy: Secondary | ICD-10-CM | POA: Diagnosis not present

## 2022-05-20 DIAGNOSIS — F902 Attention-deficit hyperactivity disorder, combined type: Secondary | ICD-10-CM | POA: Diagnosis not present

## 2022-05-20 DIAGNOSIS — Z79899 Other long term (current) drug therapy: Secondary | ICD-10-CM | POA: Diagnosis not present

## 2022-06-05 DIAGNOSIS — M67813 Other specified disorders of tendon, right shoulder: Secondary | ICD-10-CM | POA: Diagnosis not present

## 2022-07-10 DIAGNOSIS — M67813 Other specified disorders of tendon, right shoulder: Secondary | ICD-10-CM | POA: Diagnosis not present

## 2022-07-10 DIAGNOSIS — M25511 Pain in right shoulder: Secondary | ICD-10-CM | POA: Diagnosis not present

## 2022-07-28 DIAGNOSIS — Z9884 Bariatric surgery status: Secondary | ICD-10-CM | POA: Diagnosis not present

## 2022-07-28 DIAGNOSIS — E161 Other hypoglycemia: Secondary | ICD-10-CM | POA: Diagnosis not present

## 2022-07-28 DIAGNOSIS — E162 Hypoglycemia, unspecified: Secondary | ICD-10-CM | POA: Diagnosis not present

## 2022-07-28 DIAGNOSIS — F419 Anxiety disorder, unspecified: Secondary | ICD-10-CM | POA: Diagnosis not present

## 2022-07-28 DIAGNOSIS — Z79899 Other long term (current) drug therapy: Secondary | ICD-10-CM | POA: Diagnosis not present

## 2022-07-28 DIAGNOSIS — F32A Depression, unspecified: Secondary | ICD-10-CM | POA: Diagnosis not present

## 2022-08-20 DIAGNOSIS — E161 Other hypoglycemia: Secondary | ICD-10-CM | POA: Diagnosis not present

## 2022-08-20 DIAGNOSIS — Z9884 Bariatric surgery status: Secondary | ICD-10-CM | POA: Diagnosis not present

## 2022-09-21 DIAGNOSIS — R7303 Prediabetes: Secondary | ICD-10-CM | POA: Diagnosis not present

## 2022-09-21 DIAGNOSIS — E162 Hypoglycemia, unspecified: Secondary | ICD-10-CM | POA: Diagnosis not present

## 2023-04-06 ENCOUNTER — Other Ambulatory Visit: Payer: Self-pay

## 2023-04-06 DIAGNOSIS — Z9013 Acquired absence of bilateral breasts and nipples: Secondary | ICD-10-CM

## 2023-09-27 DIAGNOSIS — F902 Attention-deficit hyperactivity disorder, combined type: Secondary | ICD-10-CM | POA: Diagnosis not present

## 2023-10-18 DIAGNOSIS — F331 Major depressive disorder, recurrent, moderate: Secondary | ICD-10-CM | POA: Diagnosis not present

## 2023-10-18 DIAGNOSIS — F902 Attention-deficit hyperactivity disorder, combined type: Secondary | ICD-10-CM | POA: Diagnosis not present

## 2023-10-21 DIAGNOSIS — E162 Hypoglycemia, unspecified: Secondary | ICD-10-CM | POA: Diagnosis not present

## 2023-10-21 DIAGNOSIS — E0865 Diabetes mellitus due to underlying condition with hyperglycemia: Secondary | ICD-10-CM | POA: Diagnosis not present

## 2023-10-25 DIAGNOSIS — F331 Major depressive disorder, recurrent, moderate: Secondary | ICD-10-CM | POA: Diagnosis not present

## 2023-10-25 DIAGNOSIS — F902 Attention-deficit hyperactivity disorder, combined type: Secondary | ICD-10-CM | POA: Diagnosis not present

## 2023-11-04 DIAGNOSIS — Z1501 Genetic susceptibility to malignant neoplasm of breast: Secondary | ICD-10-CM | POA: Diagnosis not present

## 2023-11-04 DIAGNOSIS — Z9013 Acquired absence of bilateral breasts and nipples: Secondary | ICD-10-CM | POA: Diagnosis not present

## 2023-11-04 DIAGNOSIS — Z1509 Genetic susceptibility to other malignant neoplasm: Secondary | ICD-10-CM | POA: Diagnosis not present

## 2023-11-08 DIAGNOSIS — R7309 Other abnormal glucose: Secondary | ICD-10-CM | POA: Diagnosis not present

## 2023-11-08 DIAGNOSIS — E162 Hypoglycemia, unspecified: Secondary | ICD-10-CM | POA: Diagnosis not present

## 2023-11-08 DIAGNOSIS — Z8639 Personal history of other endocrine, nutritional and metabolic disease: Secondary | ICD-10-CM | POA: Diagnosis not present

## 2023-11-08 DIAGNOSIS — Z9884 Bariatric surgery status: Secondary | ICD-10-CM | POA: Diagnosis not present

## 2023-11-08 DIAGNOSIS — E119 Type 2 diabetes mellitus without complications: Secondary | ICD-10-CM | POA: Diagnosis not present

## 2023-11-08 DIAGNOSIS — E161 Other hypoglycemia: Secondary | ICD-10-CM | POA: Diagnosis not present

## 2023-11-15 DIAGNOSIS — F902 Attention-deficit hyperactivity disorder, combined type: Secondary | ICD-10-CM | POA: Diagnosis not present

## 2023-11-15 DIAGNOSIS — F331 Major depressive disorder, recurrent, moderate: Secondary | ICD-10-CM | POA: Diagnosis not present

## 2023-11-29 DIAGNOSIS — F331 Major depressive disorder, recurrent, moderate: Secondary | ICD-10-CM | POA: Diagnosis not present

## 2023-11-29 DIAGNOSIS — F902 Attention-deficit hyperactivity disorder, combined type: Secondary | ICD-10-CM | POA: Diagnosis not present

## 2023-12-06 DIAGNOSIS — F331 Major depressive disorder, recurrent, moderate: Secondary | ICD-10-CM | POA: Diagnosis not present

## 2023-12-06 DIAGNOSIS — F902 Attention-deficit hyperactivity disorder, combined type: Secondary | ICD-10-CM | POA: Diagnosis not present

## 2023-12-27 ENCOUNTER — Other Ambulatory Visit: Payer: Self-pay

## 2023-12-27 ENCOUNTER — Emergency Department (HOSPITAL_COMMUNITY)

## 2023-12-27 ENCOUNTER — Emergency Department (HOSPITAL_COMMUNITY)
Admission: EM | Admit: 2023-12-27 | Discharge: 2023-12-28 | Disposition: A | Discharge status: Home / Self Care | Attending: Emergency Medicine | Admitting: Emergency Medicine

## 2023-12-27 DIAGNOSIS — W01198A Fall on same level from slipping, tripping and stumbling with subsequent striking against other object, initial encounter: Secondary | ICD-10-CM | POA: Diagnosis not present

## 2023-12-27 DIAGNOSIS — S0990XA Unspecified injury of head, initial encounter: Secondary | ICD-10-CM | POA: Diagnosis not present

## 2023-12-27 DIAGNOSIS — G8911 Acute pain due to trauma: Secondary | ICD-10-CM | POA: Diagnosis not present

## 2023-12-27 DIAGNOSIS — Z7982 Long term (current) use of aspirin: Secondary | ICD-10-CM | POA: Diagnosis not present

## 2023-12-27 DIAGNOSIS — M25511 Pain in right shoulder: Secondary | ICD-10-CM | POA: Insufficient documentation

## 2023-12-27 DIAGNOSIS — M542 Cervicalgia: Secondary | ICD-10-CM | POA: Insufficient documentation

## 2023-12-27 DIAGNOSIS — M79601 Pain in right arm: Secondary | ICD-10-CM | POA: Diagnosis not present

## 2023-12-27 DIAGNOSIS — M25531 Pain in right wrist: Secondary | ICD-10-CM | POA: Diagnosis not present

## 2023-12-27 DIAGNOSIS — W19XXXA Unspecified fall, initial encounter: Secondary | ICD-10-CM

## 2023-12-27 DIAGNOSIS — M19011 Primary osteoarthritis, right shoulder: Secondary | ICD-10-CM | POA: Diagnosis not present

## 2023-12-27 NOTE — ED Triage Notes (Signed)
 Pt presents with injury to right arm after having an accidental fall. Hit her head. Has neck pain to the right. No LOC.

## 2023-12-27 NOTE — ED Notes (Signed)
 C collar and right arm sling applied in triage

## 2023-12-28 ENCOUNTER — Emergency Department (HOSPITAL_COMMUNITY)

## 2023-12-28 DIAGNOSIS — M19011 Primary osteoarthritis, right shoulder: Secondary | ICD-10-CM | POA: Diagnosis not present

## 2023-12-28 MED ORDER — PROMETHAZINE HCL 25 MG PO TABS
12.5000 mg | ORAL_TABLET | Freq: Once | ORAL | Status: AC
  Administered 2023-12-28: 12.5 mg via ORAL
  Filled 2023-12-28: qty 1

## 2023-12-28 MED ORDER — HYDROCODONE-ACETAMINOPHEN 5-325 MG PO TABS
1.0000 | ORAL_TABLET | Freq: Once | ORAL | Status: AC
  Administered 2023-12-28: 1 via ORAL
  Filled 2023-12-28: qty 1

## 2023-12-28 NOTE — Discharge Instructions (Signed)
 Your imaging this morning shows no fracture or dislocation.  You may use the sling for comfort and support.  Take Tylenol  and Aleve for pain and inflammation control.  Follow-up with orthopedics for further evaluation as needed.

## 2023-12-28 NOTE — ED Provider Notes (Signed)
 [place] EMERGENCY DEPARTMENT AT [facility] Provider Note   CSN: [id number] Arrival date & time: 12/27/23  2141     Patient presents with: Arm Injury (R) and Fall   [name] is a 48 y.o. female.  Patient with past medical history significant for herniated disc, migraines, generalized anxiety disorder presents to the emergency department for evaluation after a fall.  Patient states she open the door to her house and her dog ran out the door.  She states that she tried to stop the dog by grabbing the collar but got pulled out the door with the dog.  She landed on her head and right shoulder.  She complains of right shoulder pain, right sided neck pain.  She denies loss of consciousness, blood thinner usage.  She also states that she has pain when moving her right hand.    Arm Injury Fall       Prior to Admission medications   Medication Sig Start Date End Date Taking? Authorizing Provider  acetaminophen  (TYLENOL ) 500 MG tablet Take 1,000 mg by mouth every 6 (six) hours as needed for moderate pain. Patient not taking: Reported on 04/29/2021    [provider]  AJOVY 225 MG/1.5ML SOAJ Inject 225 mg into the skin every 30 (thirty) days. 04/18/21   [provider]  amphetamine -dextroamphetamine  (ADDERALL) 30 MG tablet Take 1.5 tablets by mouth daily. 04/22/21   [provider]  aspirin  EC 81 MG tablet Take 81 mg by mouth daily. Swallow whole.    [provider]  buPROPion  (WELLBUTRIN  XL) 150 MG 24 hr tablet Take 150 mg by mouth daily.    [provider]  Calcium  Carb-Cholecalciferol  500-200 MG-UNIT TABS Take 1 tablet by mouth daily. Patient not taking: Reported on 04/29/2021    [provider]  Continuous Blood Gluc Receiver (DEXCOM G6 RECEIVER) DEVI Use a directed 02/12/21   Shamleffer, Ibtehal Jaralla, MD  Continuous Blood Gluc Sensor (FREESTYLE LIBRE 3 SENSOR) MISC 1 each by Does not apply route daily. Place 1 sensor  on the skin every 14 days. Use to check glucose continuously 02/21/21   Kassie Mallick, MD  Continuous Blood Gluc Transmit (DEXCOM G6 TRANSMITTER) MISC 1 every 90 days 02/12/21   Shamleffer, Ibtehal Jaralla, MD  cyclobenzaprine  (FLEXERIL ) 10 MG tablet Take 10 mg by mouth 3 (three) times daily as needed for headache. 04/21/21   [provider]  EPINEPHrine  (EPI-PEN) 0.3 mg/0.3 mL SOAJ injection Inject 0.3 mg into the muscle once.    [provider]  escitalopram (LEXAPRO) 10 MG tablet Take 10 mg by mouth daily. 04/13/21   [provider]  escitalopram (LEXAPRO) 20 MG tablet Take 20 mg by mouth daily. 04/20/21   [provider]  gabapentin  (NEURONTIN ) 300 MG capsule Take 300 mg by mouth daily. 02/18/21   [provider]  metFORMIN  (GLUCOPHAGE -XR) 500 MG 24 hr tablet Take 1 tablet (500 mg total) by mouth daily. 02/06/21   Kassie Mallick, MD  methocarbamol  (ROBAXIN ) 500 MG tablet Take 1 tablet (500 mg total) by mouth every 8 (eight) hours as needed for muscle spasms. 05/31/18   Arelia Filippo, MD  Multiple Vitamin (MULTIVITAMIN WITH MINERALS) TABS tablet Take 1 tablet by mouth daily. [employer] Multivitamin    [provider]  OnabotulinumtoxinA (BOTOX IJ) Inject 1 each as directed every 3 (three) months. For migraines    [provider]  Plecanatide  (TRULANCE ) 3 MG TABS Take 3 mg by mouth daily.  [provider]  promethazine  (PHENERGAN ) 12.5 MG tablet Take 1 tablet (12.5 mg total) by mouth every 6 (six) hours as needed for nausea or vomiting. Patient not taking: Reported on 04/29/2021 05/31/18   Arelia Filippo, MD  Semaglutide ,0.25 or 0.5MG /DOS, (OZEMPIC , 0.25 OR 0.5 MG/DOSE,) 2 MG/1.5ML SOPN Inject 0.5 mg into the skin once a week. Patient taking differently: Inject 0.5 mg into the skin every Saturday. 03/13/21   Kassie Mallick, MD  sucralfate  (CARAFATE ) 1 GM/10ML suspension Take 1 g by mouth 4 (four) times daily. 04/11/21    [provider]  TRULICITY  0.75 MG/0.5ML SOPN Inject 0.75 mg into the skin once a week. 03/25/21   [provider]  UBRELVY 100 MG TABS Take 100 mg by mouth as needed for migraine. 03/19/21   [provider]  vitamin B-12 (CYANOCOBALAMIN ) 100 MCG tablet Take 100 mcg by mouth daily. Patient not taking: Reported on 04/29/2021    [provider]    Allergies: Ace inhibitors, Atarax  [hydroxyzine  hcl], Cephalosporins, Codeine, Compazine, Decadron  [dexamethasone ], Demerol, Glucosamine, Hydroxyzine  hcl, Metoclopramide hcl, Morphine , Morphine  and codeine, Ondansetron , Other, Penicillins, Poractant alfa, Pork-derived products, Prozac [fluoxetine hcl], Shellfish-derived products, Stevia glycerite extract [flavoring agent (non-screening)], Sulfa antibiotics, Sulfasalazine, Sumatriptan, Triptans, Zanaflex [tizanidine hcl], Zofran , Pravastatin , Skin adhesives [cyanoacrylate], Stevioside, Dihydroergotamine, and Periactin  [cyproheptadine ]    Review of Systems  Updated Vital Signs BP 102/77 (BP Location: Left Arm)   Pulse 81   Temp 98.4 F (36.9 C) (Oral)   Resp 16   LMP 03/07/2012   SpO2 100%   Physical Exam Vitals and nursing note reviewed.  HENT:     Head: Normocephalic and atraumatic.     Comments: No obvious hematoma, contusion, deformity Eyes:     Extraocular Movements: Extraocular movements intact.     Conjunctiva/sclera: Conjunctivae normal.  Pulmonary:     Effort: Pulmonary effort is normal. No respiratory distress.  Musculoskeletal:        General: Tenderness and signs of injury present. No swelling or deformity.     Cervical back: Normal range of motion. Tenderness (Right sided, no midline tenderness) present.     Comments: No obvious deformity to right shoulder, elbow, forearm, wrist. Patient able to move upper right extremity. Pain reported with any active or passive ROM of right shoulder. Palpable radial pulse. Sensation grossly intact.   Skin:     General: Skin is dry.  Neurological:     Mental Status: She is alert.  Psychiatric:        Speech: Speech normal.        Behavior: Behavior normal.     (all labs ordered are listed, but only abnormal results are displayed) Labs Reviewed - No data to display  EKG: None  Radiology: DG Shoulder Right Result Date: 12/28/2023 CLINICAL DATA:  Right shoulder pain post fall Patient fell 10/06 late pm, states she came down on right shoulder, pain lateral and posterior shoulder, limited movement, unable to raise arm EXAM: RIGHT SHOULDER - 2+ VIEW COMPARISON:  None Available. FINDINGS: There is no evidence of fracture or dislocation. Mild degenerative changes of the right shoulder. Soft tissues are unremarkable. IMPRESSION: No acute displaced fracture or dislocation. Electronically Signed   By: Morgane  Naveau M.D.   On: 12/28/2023 01:53   CT Head Wo Contrast Result Date: 12/27/2023 CLINICAL DATA:  Fall Pt presents with injury to right arm after having an accidental fall. Hit her head. Has neck pain to the right. No LOC EXAM: CT HEAD WITHOUT CONTRAST CT  CERVICAL SPINE WITHOUT CONTRAST TECHNIQUE: Multidetector CT imaging of the head and cervical spine was performed following the standard protocol without intravenous contrast. Multiplanar CT image reconstructions of the cervical spine were also generated. RADIATION DOSE REDUCTION: This exam was performed according to the departmental dose-optimization program which includes automated exposure control, adjustment of the mA and/or kV according to patient size and/or use of iterative reconstruction technique. COMPARISON:  CT head 04/29/2021 FINDINGS: CT HEAD FINDINGS Brain: No evidence of large-territorial acute infarction. No parenchymal hemorrhage. No mass lesion. No extra-axial collection. No mass effect or midline shift. No hydrocephalus. Basilar cisterns are patent. Vascular: No hyperdense vessel. Skull: No acute fracture or focal lesion. Sinuses/Orbits:  Paranasal sinuses and mastoid air cells are clear. The orbits are unremarkable. Other: None. CT CERVICAL SPINE FINDINGS Alignment: Normal. Skull base and vertebrae: Mild degenerative changes of the C5-C6 level. No severe osseous neural or central canal stenosis. No acute fracture. No aggressive appearing focal osseous lesion or focal pathologic process. Soft tissues and spinal canal: No prevertebral fluid or swelling. No visible canal hematoma. Upper chest: Unremarkable. Other: None. IMPRESSION: 1. No acute intracranial abnormality. 2. No acute displaced fracture or traumatic listhesis of the cervical spine. Electronically Signed   By: Morgane  Naveau M.D.   On: 12/27/2023 23:21   CT Cervical Spine Wo Contrast Result Date: 12/27/2023 CLINICAL DATA:  Fall Pt presents with injury to right arm after having an accidental fall. Hit her head. Has neck pain to the right. No LOC EXAM: CT HEAD WITHOUT CONTRAST CT CERVICAL SPINE WITHOUT CONTRAST TECHNIQUE: Multidetector CT imaging of the head and cervical spine was performed following the standard protocol without intravenous contrast. Multiplanar CT image reconstructions of the cervical spine were also generated. RADIATION DOSE REDUCTION: This exam was performed according to the departmental dose-optimization program which includes automated exposure control, adjustment of the mA and/or kV according to patient size and/or use of iterative reconstruction technique. COMPARISON:  CT head 04/29/2021 FINDINGS: CT HEAD FINDINGS Brain: No evidence of large-territorial acute infarction. No parenchymal hemorrhage. No mass lesion. No extra-axial collection. No mass effect or midline shift. No hydrocephalus. Basilar cisterns are patent. Vascular: No hyperdense vessel. Skull: No acute fracture or focal lesion. Sinuses/Orbits: Paranasal sinuses and mastoid air cells are clear. The orbits are unremarkable. Other: None. CT CERVICAL SPINE FINDINGS Alignment: Normal. Skull base and  vertebrae: Mild degenerative changes of the C5-C6 level. No severe osseous neural or central canal stenosis. No acute fracture. No aggressive appearing focal osseous lesion or focal pathologic process. Soft tissues and spinal canal: No prevertebral fluid or swelling. No visible canal hematoma. Upper chest: Unremarkable. Other: None. IMPRESSION: 1. No acute intracranial abnormality. 2. No acute displaced fracture or traumatic listhesis of the cervical spine. Electronically Signed   By: Morgane  Naveau M.D.   On: 12/27/2023 23:21   DG Wrist Complete Right Result Date: 12/27/2023 CLINICAL DATA:  Fall Patient fell this pm, went down onto arm, pain ulnar side of wrist and forearm, limited movement EXAM: RIGHT WRIST - COMPLETE 3+ VIEW COMPARISON:  None Available. FINDINGS: There is no evidence of fracture or dislocation. There is no evidence of arthropathy or other focal bone abnormality. Soft tissues are unremarkable. IMPRESSION: Negative. Electronically Signed   By: Morgane  Naveau M.D.   On: 12/27/2023 22:32   DG Forearm Right Result Date: 12/27/2023 CLINICAL DATA:  fall Patient fell this pm, went down onto arm, pain ulnar side of wrist and forearm, limited movement EXAM: RIGHT FOREARM - 2  VIEW COMPARISON:  None Available. FINDINGS: There is no evidence of fracture or other focal bone lesions. Soft tissues are unremarkable. IMPRESSION: Negative. Electronically Signed   By: Morgane  Naveau M.D.   On: 12/27/2023 22:32     Procedures   Medications Ordered in the ED  promethazine  (PHENERGAN ) tablet 12.5 mg (12.5 mg Oral Given 12/28/23 0132)  HYDROcodone -acetaminophen  (NORCO/VICODIN) 5-325 MG per tablet 1 tablet (1 tablet Oral Given 12/28/23 0131)                                    Medical Decision Making Amount and/or Complexity of Data Reviewed Radiology: ordered.  Risk Prescription drug management.   This patient presents to the ED for concern of pain possible, this involves an extensive number  of treatment options, and is a complaint that carries with it a high risk of complications and morbidity.  The differential diagnosis includes intracranial abnormality, fracture, dislocation, soft tissue injury, others   Co morbidities / Chronic conditions that complicate the patient evaluation  As noted in HPI   Additional history obtained:  Additional history obtained from EMR   Imaging Studies ordered:  I ordered imaging studies including CT scans of the head and cervical spine, plain films of the right shoulder, forearm, wrist I independently visualized and interpreted imaging which showed no acute findings I agree with the radiologist interpretation    Problem List / ED Course / Critical interventions / Medication management   I ordered medication including Norco and Phenergan  Reevaluation of the patient after these medicines showed that the patient improved   Test / Admission - Considered:  Patient with no acute fracture, dislocation, or intracranial abnormality on imaging.  She does continue to have pain in the right shoulder.  I have some concern about possible muscular/rotator cuff injury.  Patient had been placed in a sling prior to my assessment.  Will recommend the patient continue to use sling at home and follow-up with orthopedics.  She may use Tylenol  for pain control.  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      Final diagnoses:  Fall, initial encounter  Acute pain of right shoulder    ED Discharge Orders     None          Logan Ubaldo KATHEE DEVONNA 12/28/23 0220    Trine Raynell Moder, MD 12/28/23 [phone]
